# Patient Record
Sex: Female | Born: 1935 | Race: White | Hispanic: No | Smoking: Former smoker
Health system: Southern US, Community
[De-identification: ages and names within clinical notes are randomized; demographics above are authoritative.]

## PROBLEM LIST (undated history)

## (undated) DIAGNOSIS — I10 Essential (primary) hypertension: Secondary | ICD-10-CM

## (undated) DIAGNOSIS — S72142D Displaced intertrochanteric fracture of left femur, subsequent encounter for closed fracture with routine healing: Secondary | ICD-10-CM

## (undated) DIAGNOSIS — M810 Age-related osteoporosis without current pathological fracture: Secondary | ICD-10-CM

## (undated) DIAGNOSIS — I341 Nonrheumatic mitral (valve) prolapse: Secondary | ICD-10-CM

## (undated) DIAGNOSIS — M1990 Unspecified osteoarthritis, unspecified site: Secondary | ICD-10-CM

## (undated) DIAGNOSIS — G9340 Encephalopathy, unspecified: Secondary | ICD-10-CM

## (undated) DIAGNOSIS — R0789 Other chest pain: Secondary | ICD-10-CM

## (undated) DIAGNOSIS — C50919 Malignant neoplasm of unspecified site of unspecified female breast: Secondary | ICD-10-CM

## (undated) DIAGNOSIS — K2950 Unspecified chronic gastritis without bleeding: Secondary | ICD-10-CM

## (undated) DIAGNOSIS — B3781 Candidal esophagitis: Secondary | ICD-10-CM

## (undated) DIAGNOSIS — R569 Unspecified convulsions: Secondary | ICD-10-CM

## (undated) DIAGNOSIS — D62 Acute posthemorrhagic anemia: Secondary | ICD-10-CM

## (undated) DIAGNOSIS — R002 Palpitations: Secondary | ICD-10-CM

## (undated) DIAGNOSIS — I639 Cerebral infarction, unspecified: Secondary | ICD-10-CM

## (undated) DIAGNOSIS — S72009A Fracture of unspecified part of neck of unspecified femur, initial encounter for closed fracture: Secondary | ICD-10-CM

## (undated) DIAGNOSIS — J310 Chronic rhinitis: Secondary | ICD-10-CM

## (undated) DIAGNOSIS — I493 Ventricular premature depolarization: Secondary | ICD-10-CM

## (undated) DIAGNOSIS — R2681 Unsteadiness on feet: Secondary | ICD-10-CM

## (undated) DIAGNOSIS — R4182 Altered mental status, unspecified: Secondary | ICD-10-CM

## (undated) DIAGNOSIS — K2940 Chronic atrophic gastritis without bleeding: Secondary | ICD-10-CM

## (undated) DIAGNOSIS — D751 Secondary polycythemia: Secondary | ICD-10-CM

## (undated) DIAGNOSIS — K589 Irritable bowel syndrome without diarrhea: Secondary | ICD-10-CM

## (undated) DIAGNOSIS — K5790 Diverticulosis of intestine, part unspecified, without perforation or abscess without bleeding: Secondary | ICD-10-CM

## (undated) HISTORY — DX: Nonrheumatic mitral (valve) prolapse: I34.1

## (undated) HISTORY — DX: Secondary polycythemia: D75.1

## (undated) HISTORY — DX: Age-related osteoporosis without current pathological fracture: M81.0

## (undated) HISTORY — DX: Essential (primary) hypertension: I10

## (undated) HISTORY — PX: TONSILLECTOMY: SUR1361

## (undated) HISTORY — DX: Other chest pain: R07.89

## (undated) HISTORY — DX: Unsteadiness on feet: R26.81

## (undated) HISTORY — DX: Chronic rhinitis: J31.0

## (undated) HISTORY — DX: Palpitations: R00.2

## (undated) HISTORY — DX: Displaced intertrochanteric fracture of left femur, subsequent encounter for closed fracture with routine healing: S72.142D

## (undated) HISTORY — DX: Encephalopathy, unspecified: G93.40

## (undated) HISTORY — DX: Ventricular premature depolarization: I49.3

## (undated) HISTORY — DX: Irritable bowel syndrome, unspecified: K58.9

## (undated) HISTORY — DX: Fracture of unspecified part of neck of unspecified femur, initial encounter for closed fracture: S72.009A

## (undated) HISTORY — DX: Unspecified chronic gastritis without bleeding: K29.50

## (undated) HISTORY — DX: Chronic atrophic gastritis without bleeding: K29.40

## (undated) HISTORY — DX: Acute posthemorrhagic anemia: D62

## (undated) HISTORY — PX: APPENDECTOMY: SHX54

## (undated) HISTORY — DX: Altered mental status, unspecified: R41.82

## (undated) HISTORY — PX: ABDOMINAL HYSTERECTOMY: SHX81

## (undated) HISTORY — DX: Malignant neoplasm of unspecified site of unspecified female breast: C50.919

## (undated) HISTORY — DX: Unspecified osteoarthritis, unspecified site: M19.90

## (undated) HISTORY — DX: Unspecified convulsions: R56.9

## (undated) HISTORY — PX: HEMATOMA EVACUATION: SHX5118

## (undated) HISTORY — PX: CATARACT EXTRACTION: SUR2

## (undated) HISTORY — PX: ELBOW FRACTURE SURGERY: SHX616

## (undated) HISTORY — PX: BREAST LUMPECTOMY: SHX2

## (undated) HISTORY — DX: Cerebral infarction, unspecified: I63.9

## (undated) HISTORY — DX: Candidal esophagitis: B37.81

## (undated) HISTORY — DX: Diverticulosis of intestine, part unspecified, without perforation or abscess without bleeding: K57.90

---

## 1999-04-16 ENCOUNTER — Other Ambulatory Visit: Admission: RE | Admit: 1999-04-16 | Discharge: 1999-04-16 | Payer: Self-pay | Admitting: Obstetrics and Gynecology

## 2000-02-29 ENCOUNTER — Emergency Department (HOSPITAL_COMMUNITY): Admission: EM | Admit: 2000-02-29 | Discharge: 2000-02-29 | Payer: Self-pay | Admitting: Emergency Medicine

## 2000-09-12 ENCOUNTER — Encounter: Admission: RE | Admit: 2000-09-12 | Discharge: 2000-09-12 | Payer: Self-pay | Admitting: Internal Medicine

## 2000-09-12 ENCOUNTER — Encounter: Payer: Self-pay | Admitting: Internal Medicine

## 2000-12-23 HISTORY — PX: CARDIOVASCULAR STRESS TEST: SHX262

## 2001-05-29 ENCOUNTER — Other Ambulatory Visit: Admission: RE | Admit: 2001-05-29 | Discharge: 2001-05-29 | Payer: Self-pay | Admitting: Obstetrics and Gynecology

## 2002-11-22 ENCOUNTER — Encounter: Payer: Self-pay | Admitting: Internal Medicine

## 2002-11-22 ENCOUNTER — Encounter: Admission: RE | Admit: 2002-11-22 | Discharge: 2002-11-22 | Payer: Self-pay | Admitting: Internal Medicine

## 2004-06-19 ENCOUNTER — Ambulatory Visit (HOSPITAL_COMMUNITY): Admission: RE | Admit: 2004-06-19 | Discharge: 2004-06-20 | Payer: Self-pay | Admitting: Specialist

## 2004-11-20 ENCOUNTER — Encounter: Admission: RE | Admit: 2004-11-20 | Discharge: 2004-11-20 | Payer: Self-pay | Admitting: Obstetrics and Gynecology

## 2005-02-04 ENCOUNTER — Ambulatory Visit: Payer: Self-pay | Admitting: Internal Medicine

## 2005-03-16 ENCOUNTER — Encounter (INDEPENDENT_AMBULATORY_CARE_PROVIDER_SITE_OTHER): Payer: Self-pay | Admitting: *Deleted

## 2005-03-16 ENCOUNTER — Other Ambulatory Visit: Admission: RE | Admit: 2005-03-16 | Discharge: 2005-03-16 | Payer: Self-pay | Admitting: Internal Medicine

## 2005-03-16 ENCOUNTER — Ambulatory Visit: Payer: Self-pay | Admitting: Internal Medicine

## 2005-05-31 ENCOUNTER — Encounter: Admission: RE | Admit: 2005-05-31 | Discharge: 2005-05-31 | Payer: Self-pay | Admitting: Cardiovascular Disease

## 2005-12-05 ENCOUNTER — Encounter: Admission: RE | Admit: 2005-12-05 | Discharge: 2005-12-05 | Payer: Self-pay | Admitting: Radiology

## 2005-12-31 ENCOUNTER — Encounter: Admission: RE | Admit: 2005-12-31 | Discharge: 2005-12-31 | Payer: Self-pay | Admitting: General Surgery

## 2006-01-01 ENCOUNTER — Encounter: Admission: RE | Admit: 2006-01-01 | Discharge: 2006-01-01 | Payer: Self-pay | Admitting: General Surgery

## 2006-01-04 ENCOUNTER — Encounter (INDEPENDENT_AMBULATORY_CARE_PROVIDER_SITE_OTHER): Payer: Self-pay | Admitting: Specialist

## 2006-01-04 ENCOUNTER — Ambulatory Visit (HOSPITAL_BASED_OUTPATIENT_CLINIC_OR_DEPARTMENT_OTHER): Admission: RE | Admit: 2006-01-04 | Discharge: 2006-01-04 | Payer: Self-pay | Admitting: General Surgery

## 2006-01-05 ENCOUNTER — Ambulatory Visit: Payer: Self-pay | Admitting: Oncology

## 2006-01-10 ENCOUNTER — Ambulatory Visit: Admission: RE | Admit: 2006-01-10 | Discharge: 2006-04-27 | Payer: Self-pay | Admitting: Radiation Oncology

## 2006-01-20 LAB — COMPREHENSIVE METABOLIC PANEL
Albumin: 4.3 g/dL (ref 3.5–5.2)
BUN: 15 mg/dL (ref 6–23)
CO2: 31 mEq/L (ref 19–32)
Calcium: 10.1 mg/dL (ref 8.4–10.5)
Chloride: 99 mEq/L (ref 96–112)
Glucose, Bld: 160 mg/dL — ABNORMAL HIGH (ref 70–99)
Potassium: 4.3 mEq/L (ref 3.5–5.3)

## 2006-01-20 LAB — CBC WITH DIFFERENTIAL/PLATELET
Basophils Absolute: 0 10*3/uL (ref 0.0–0.1)
Eosinophils Absolute: 0.1 10*3/uL (ref 0.0–0.5)
HGB: 15.8 g/dL (ref 11.6–15.9)
MCV: 89.7 fL (ref 81.0–101.0)
MONO#: 0.7 10*3/uL (ref 0.1–0.9)
NEUT#: 6.8 10*3/uL — ABNORMAL HIGH (ref 1.5–6.5)
RDW: 16 % — ABNORMAL HIGH (ref 11.3–14.5)
lymph#: 1 10*3/uL (ref 0.9–3.3)

## 2006-01-21 ENCOUNTER — Ambulatory Visit (HOSPITAL_COMMUNITY): Admission: RE | Admit: 2006-01-21 | Discharge: 2006-01-21 | Payer: Self-pay | Admitting: Oncology

## 2006-01-31 ENCOUNTER — Ambulatory Visit (HOSPITAL_COMMUNITY): Admission: RE | Admit: 2006-01-31 | Discharge: 2006-01-31 | Payer: Self-pay | Admitting: General Surgery

## 2006-02-04 LAB — MORPHOLOGY: Tear Drop Cells: NONE SEEN

## 2006-02-04 LAB — CBC WITH DIFFERENTIAL/PLATELET
Basophils Absolute: 0 10*3/uL (ref 0.0–0.1)
EOS%: 2.1 % (ref 0.0–7.0)
Eosinophils Absolute: 0.2 10*3/uL (ref 0.0–0.5)
HGB: 15.9 g/dL (ref 11.6–15.9)
MCH: 29.9 pg (ref 26.0–34.0)
NEUT#: 7.3 10*3/uL — ABNORMAL HIGH (ref 1.5–6.5)
RBC: 5.34 10*6/uL — ABNORMAL HIGH (ref 3.70–5.32)
RDW: 15.8 % — ABNORMAL HIGH (ref 11.3–14.5)
lymph#: 1 10*3/uL (ref 0.9–3.3)

## 2006-02-04 LAB — CHCC SMEAR

## 2006-03-01 ENCOUNTER — Ambulatory Visit: Payer: Self-pay | Admitting: Oncology

## 2006-03-04 LAB — MORPHOLOGY: PLT EST: INCREASED

## 2006-03-04 LAB — CBC WITH DIFFERENTIAL/PLATELET
Basophils Absolute: 0 10*3/uL (ref 0.0–0.1)
EOS%: 4.7 % (ref 0.0–7.0)
Eosinophils Absolute: 0.4 10*3/uL (ref 0.0–0.5)
HGB: 16.8 g/dL — ABNORMAL HIGH (ref 11.6–15.9)
LYMPH%: 17 % (ref 14.0–48.0)
MCH: 30.4 pg (ref 26.0–34.0)
MCV: 89.5 fL (ref 81.0–101.0)
MONO%: 10.5 % (ref 0.0–13.0)
NEUT#: 5.4 10*3/uL (ref 1.5–6.5)
NEUT%: 67.7 % (ref 39.6–76.8)
Platelets: 615 10*3/uL — ABNORMAL HIGH (ref 145–400)

## 2006-03-04 LAB — CHCC SMEAR

## 2006-03-18 LAB — CBC WITH DIFFERENTIAL/PLATELET
BASO%: 0.5 % (ref 0.0–2.0)
Basophils Absolute: 0 10*3/uL (ref 0.0–0.1)
Eosinophils Absolute: 0.4 10*3/uL (ref 0.0–0.5)
HCT: 51.8 % — ABNORMAL HIGH (ref 34.8–46.6)
HGB: 17.8 g/dL — ABNORMAL HIGH (ref 11.6–15.9)
MCHC: 34.3 g/dL (ref 32.0–36.0)
MONO#: 0.7 10*3/uL (ref 0.1–0.9)
NEUT#: 5.9 10*3/uL (ref 1.5–6.5)
NEUT%: 74 % (ref 39.6–76.8)
WBC: 7.9 10*3/uL (ref 3.9–10.0)
lymph#: 0.9 10*3/uL (ref 0.9–3.3)

## 2006-03-29 LAB — CBC WITH DIFFERENTIAL/PLATELET
BASO%: 0.6 % (ref 0.0–2.0)
LYMPH%: 12.2 % — ABNORMAL LOW (ref 14.0–48.0)
MCHC: 33.9 g/dL (ref 32.0–36.0)
MCV: 87.5 fL (ref 81.0–101.0)
MONO%: 10.3 % (ref 0.0–13.0)
Platelets: 588 10*3/uL — ABNORMAL HIGH (ref 145–400)
RBC: 5.76 10*6/uL — ABNORMAL HIGH (ref 3.70–5.32)

## 2006-04-08 ENCOUNTER — Ambulatory Visit: Admission: RE | Admit: 2006-04-08 | Discharge: 2006-04-27 | Payer: Self-pay | Admitting: Radiation Oncology

## 2006-04-13 LAB — CBC WITH DIFFERENTIAL/PLATELET
Basophils Absolute: 0 10*3/uL (ref 0.0–0.1)
EOS%: 5.4 % (ref 0.0–7.0)
Eosinophils Absolute: 0.4 10*3/uL (ref 0.0–0.5)
MCHC: 33.8 g/dL (ref 32.0–36.0)
MONO%: 9.7 % (ref 0.0–13.0)
NEUT%: 74.8 % (ref 39.6–76.8)
Platelets: 521 10*3/uL — ABNORMAL HIGH (ref 145–400)
RBC: 6.01 10*6/uL — ABNORMAL HIGH (ref 3.70–5.32)
RDW: 13.1 % (ref 11.3–14.5)
WBC: 7.4 10*3/uL (ref 3.9–10.0)

## 2006-06-10 ENCOUNTER — Ambulatory Visit: Payer: Self-pay | Admitting: Oncology

## 2006-06-14 LAB — CBC & DIFF AND RETIC
BASO%: 0.5 % (ref 0.0–2.0)
Eosinophils Absolute: 0.4 10*3/uL (ref 0.0–0.5)
IRF: 0.29 (ref 0.130–0.330)
MCHC: 33.9 g/dL (ref 32.0–36.0)
MONO#: 0.8 10*3/uL (ref 0.1–0.9)
NEUT#: 5.3 10*3/uL (ref 1.5–6.5)
RBC: 6.3 10*6/uL — ABNORMAL HIGH (ref 3.70–5.32)
RETIC #: 66.8 10*3/uL (ref 19.7–115.1)
Retic %: 1.1 % (ref 0.4–2.3)
WBC: 7.2 10*3/uL (ref 3.9–10.0)
lymph#: 0.7 10*3/uL — ABNORMAL LOW (ref 0.9–3.3)

## 2006-06-14 LAB — MORPHOLOGY: PLT EST: INCREASED

## 2006-06-14 LAB — CHCC SMEAR

## 2006-08-23 ENCOUNTER — Inpatient Hospital Stay (HOSPITAL_COMMUNITY): Admission: EM | Admit: 2006-08-23 | Discharge: 2006-08-28 | Payer: Self-pay | Admitting: Emergency Medicine

## 2007-01-23 ENCOUNTER — Ambulatory Visit: Payer: Self-pay | Admitting: Vascular Surgery

## 2007-04-19 ENCOUNTER — Ambulatory Visit: Payer: Self-pay | Admitting: Oncology

## 2007-04-21 LAB — CBC & DIFF AND RETIC
BASO%: 0.3 % (ref 0.0–2.0)
HCT: 54.2 % — ABNORMAL HIGH (ref 34.8–46.6)
IRF: 0.31 (ref 0.130–0.330)
MCHC: 33.8 g/dL (ref 32.0–36.0)
MONO#: 0.7 10*3/uL (ref 0.1–0.9)
NEUT#: 5.2 10*3/uL (ref 1.5–6.5)
NEUT%: 71.1 % (ref 39.6–76.8)
Retic %: 1.1 % (ref 0.4–2.3)
WBC: 7.3 10*3/uL (ref 3.9–10.0)
lymph#: 1 10*3/uL (ref 0.9–3.3)

## 2007-04-21 LAB — CHCC SMEAR

## 2007-04-21 LAB — MORPHOLOGY: RBC Comments: NORMAL

## 2007-04-26 LAB — COMPREHENSIVE METABOLIC PANEL
ALT: 16 U/L (ref 0–35)
Alkaline Phosphatase: 85 U/L (ref 39–117)
CO2: 33 mEq/L — ABNORMAL HIGH (ref 19–32)
Sodium: 139 mEq/L (ref 135–145)
Total Bilirubin: 0.5 mg/dL (ref 0.3–1.2)
Total Protein: 7 g/dL (ref 6.0–8.3)

## 2007-05-11 ENCOUNTER — Encounter: Admission: RE | Admit: 2007-05-11 | Discharge: 2007-05-11 | Payer: Self-pay | Admitting: Internal Medicine

## 2007-05-15 LAB — CBC WITH DIFFERENTIAL/PLATELET
BASO%: 0.5 % (ref 0.0–2.0)
EOS%: 4.6 % (ref 0.0–7.0)
HCT: 55 % — ABNORMAL HIGH (ref 34.8–46.6)
MCH: 27.6 pg (ref 26.0–34.0)
MCHC: 33.4 g/dL (ref 32.0–36.0)
NEUT%: 70.1 % (ref 39.6–76.8)
RBC: 6.67 10*6/uL — ABNORMAL HIGH (ref 3.70–5.32)
RDW: 14.5 % (ref 11.3–14.5)
lymph#: 0.9 10*3/uL (ref 0.9–3.3)

## 2007-05-24 LAB — BCR/ABL

## 2007-05-29 ENCOUNTER — Ambulatory Visit: Payer: Self-pay | Admitting: Oncology

## 2007-05-31 LAB — CBC WITH DIFFERENTIAL/PLATELET
EOS%: 4.9 % (ref 0.0–7.0)
Eosinophils Absolute: 0.3 10*3/uL (ref 0.0–0.5)
MCH: 27.4 pg (ref 26.0–34.0)
MCV: 82.3 fL (ref 81.0–101.0)
MONO%: 10.3 % (ref 0.0–13.0)
NEUT#: 5 10*3/uL (ref 1.5–6.5)
RBC: 6.14 10*6/uL — ABNORMAL HIGH (ref 3.70–5.32)
RDW: 14.2 % (ref 11.3–14.5)

## 2007-05-31 LAB — COMPREHENSIVE METABOLIC PANEL
AST: 20 U/L (ref 0–37)
Albumin: 4.1 g/dL (ref 3.5–5.2)
Alkaline Phosphatase: 81 U/L (ref 39–117)
Potassium: 4.8 mEq/L (ref 3.5–5.3)
Sodium: 137 mEq/L (ref 135–145)
Total Protein: 6.5 g/dL (ref 6.0–8.3)

## 2007-06-14 LAB — CBC WITH DIFFERENTIAL/PLATELET
Basophils Absolute: 0 10*3/uL (ref 0.0–0.1)
HCT: 49 % — ABNORMAL HIGH (ref 34.8–46.6)
HGB: 16.3 g/dL — ABNORMAL HIGH (ref 11.6–15.9)
MCH: 27.1 pg (ref 26.0–34.0)
MONO#: 0.6 10*3/uL (ref 0.1–0.9)
NEUT%: 75.2 % (ref 39.6–76.8)
Platelets: 730 10*3/uL — ABNORMAL HIGH (ref 145–400)
WBC: 7.7 10*3/uL (ref 3.9–10.0)
lymph#: 0.9 10*3/uL (ref 0.9–3.3)

## 2007-06-14 LAB — COMPREHENSIVE METABOLIC PANEL
BUN: 13 mg/dL (ref 6–23)
CO2: 29 mEq/L (ref 19–32)
Calcium: 10.1 mg/dL (ref 8.4–10.5)
Chloride: 101 mEq/L (ref 96–112)
Creatinine, Ser: 0.94 mg/dL (ref 0.40–1.20)

## 2007-06-19 DIAGNOSIS — K2940 Chronic atrophic gastritis without bleeding: Secondary | ICD-10-CM | POA: Insufficient documentation

## 2007-06-19 DIAGNOSIS — M129 Arthropathy, unspecified: Secondary | ICD-10-CM | POA: Insufficient documentation

## 2007-06-19 DIAGNOSIS — K5730 Diverticulosis of large intestine without perforation or abscess without bleeding: Secondary | ICD-10-CM | POA: Insufficient documentation

## 2007-06-20 ENCOUNTER — Ambulatory Visit: Payer: Self-pay | Admitting: Internal Medicine

## 2007-06-20 DIAGNOSIS — C50919 Malignant neoplasm of unspecified site of unspecified female breast: Secondary | ICD-10-CM | POA: Insufficient documentation

## 2007-06-20 DIAGNOSIS — D45 Polycythemia vera: Secondary | ICD-10-CM | POA: Insufficient documentation

## 2007-06-20 DIAGNOSIS — R1013 Epigastric pain: Secondary | ICD-10-CM

## 2007-06-27 ENCOUNTER — Ambulatory Visit: Payer: Self-pay | Admitting: Internal Medicine

## 2007-06-27 ENCOUNTER — Encounter: Payer: Self-pay | Admitting: Internal Medicine

## 2007-06-29 ENCOUNTER — Encounter: Payer: Self-pay | Admitting: Internal Medicine

## 2007-07-10 ENCOUNTER — Ambulatory Visit: Payer: Self-pay | Admitting: Oncology

## 2007-07-12 LAB — CBC WITH DIFFERENTIAL/PLATELET
BASO%: 0.1 % (ref 0.0–2.0)
EOS%: 6.4 % (ref 0.0–7.0)
LYMPH%: 11.3 % — ABNORMAL LOW (ref 14.0–48.0)
MCHC: 32.5 g/dL (ref 32.0–36.0)
MCV: 77.6 fL — ABNORMAL LOW (ref 81.0–101.0)
MONO%: 8.3 % (ref 0.0–13.0)
Platelets: 868 10*3/uL — ABNORMAL HIGH (ref 145–400)
RBC: 4.61 10*6/uL (ref 3.70–5.32)
RDW: 14.1 % (ref 11.3–14.5)

## 2007-07-12 LAB — COMPREHENSIVE METABOLIC PANEL WITH GFR
ALT: 15 U/L (ref 0–35)
AST: 18 U/L (ref 0–37)
Albumin: 4 g/dL (ref 3.5–5.2)
Alkaline Phosphatase: 74 U/L (ref 39–117)
BUN: 12 mg/dL (ref 6–23)
CO2: 28 meq/L (ref 19–32)
Calcium: 9.3 mg/dL (ref 8.4–10.5)
Chloride: 102 meq/L (ref 96–112)
Creatinine, Ser: 0.96 mg/dL (ref 0.40–1.20)
Glucose, Bld: 105 mg/dL — ABNORMAL HIGH (ref 70–99)
Potassium: 4.8 meq/L (ref 3.5–5.3)
Sodium: 139 meq/L (ref 135–145)
Total Bilirubin: 0.3 mg/dL (ref 0.3–1.2)
Total Protein: 6.4 g/dL (ref 6.0–8.3)

## 2007-07-12 LAB — LACTATE DEHYDROGENASE: LDH: 163 U/L (ref 94–250)

## 2007-07-26 ENCOUNTER — Encounter: Payer: Self-pay | Admitting: Internal Medicine

## 2007-07-26 LAB — CBC WITH DIFFERENTIAL/PLATELET
BASO%: 0.1 % (ref 0.0–2.0)
HCT: 38.3 % (ref 34.8–46.6)
LYMPH%: 13.2 % — ABNORMAL LOW (ref 14.0–48.0)
MCH: 24.6 pg — ABNORMAL LOW (ref 26.0–34.0)
MCHC: 32.7 g/dL (ref 32.0–36.0)
MCV: 75.1 fL — ABNORMAL LOW (ref 81.0–101.0)
MONO#: 0.7 10*3/uL (ref 0.1–0.9)
MONO%: 9.8 % (ref 0.0–13.0)
NEUT%: 70 % (ref 39.6–76.8)
Platelets: 701 10*3/uL — ABNORMAL HIGH (ref 145–400)
WBC: 7.6 10*3/uL (ref 3.9–10.0)

## 2007-07-26 LAB — COMPREHENSIVE METABOLIC PANEL
Albumin: 4 g/dL (ref 3.5–5.2)
Alkaline Phosphatase: 80 U/L (ref 39–117)
Calcium: 9.5 mg/dL (ref 8.4–10.5)
Chloride: 103 mEq/L (ref 96–112)
Glucose, Bld: 95 mg/dL (ref 70–99)
Potassium: 4.6 mEq/L (ref 3.5–5.3)
Sodium: 138 mEq/L (ref 135–145)
Total Protein: 6.5 g/dL (ref 6.0–8.3)

## 2007-07-26 LAB — FERRITIN: Ferritin: 4 ng/mL — ABNORMAL LOW (ref 10–291)

## 2007-07-26 LAB — IRON AND TIBC: Iron: 16 ug/dL — ABNORMAL LOW (ref 42–145)

## 2007-08-09 LAB — CBC WITH DIFFERENTIAL/PLATELET
BASO%: 0.3 % (ref 0.0–2.0)
EOS%: 5.1 % (ref 0.0–7.0)
MCH: 24.4 pg — ABNORMAL LOW (ref 26.0–34.0)
MCHC: 32.5 g/dL (ref 32.0–36.0)
MCV: 75 fL — ABNORMAL LOW (ref 81.0–101.0)
MONO%: 7.4 % (ref 0.0–13.0)
NEUT#: 5.9 10*3/uL (ref 1.5–6.5)
RBC: 5.61 10*6/uL — ABNORMAL HIGH (ref 3.70–5.32)
RDW: 16.8 % — ABNORMAL HIGH (ref 11.3–14.5)

## 2007-08-09 LAB — COMPREHENSIVE METABOLIC PANEL
AST: 18 U/L (ref 0–37)
Albumin: 4.4 g/dL (ref 3.5–5.2)
Alkaline Phosphatase: 76 U/L (ref 39–117)
Potassium: 4.5 mEq/L (ref 3.5–5.3)
Sodium: 141 mEq/L (ref 135–145)
Total Bilirubin: 0.4 mg/dL (ref 0.3–1.2)
Total Protein: 7 g/dL (ref 6.0–8.3)

## 2007-08-17 ENCOUNTER — Ambulatory Visit: Payer: Self-pay | Admitting: Oncology

## 2007-08-22 ENCOUNTER — Encounter: Payer: Self-pay | Admitting: Internal Medicine

## 2007-08-22 LAB — CBC WITH DIFFERENTIAL/PLATELET
Basophils Absolute: 0.1 10*3/uL (ref 0.0–0.1)
EOS%: 3.9 % (ref 0.0–7.0)
Eosinophils Absolute: 0.3 10*3/uL (ref 0.0–0.5)
HGB: 14 g/dL (ref 11.6–15.9)
LYMPH%: 15.2 % (ref 14.0–48.0)
MCH: 25 pg — ABNORMAL LOW (ref 26.0–34.0)
MCV: 77.8 fL — ABNORMAL LOW (ref 81.0–101.0)
MONO%: 10.2 % (ref 0.0–13.0)
NEUT#: 5.4 10*3/uL (ref 1.5–6.5)
Platelets: 553 10*3/uL — ABNORMAL HIGH (ref 145–400)
RBC: 5.63 10*6/uL — ABNORMAL HIGH (ref 3.70–5.32)

## 2007-09-05 LAB — CBC WITH DIFFERENTIAL/PLATELET
Basophils Absolute: 0 10*3/uL (ref 0.0–0.1)
Eosinophils Absolute: 0.2 10*3/uL (ref 0.0–0.5)
HCT: 47.5 % — ABNORMAL HIGH (ref 34.8–46.6)
HGB: 15.4 g/dL (ref 11.6–15.9)
LYMPH%: 12.8 % — ABNORMAL LOW (ref 14.0–48.0)
MCV: 79.1 fL — ABNORMAL LOW (ref 81.0–101.0)
MONO#: 0.6 10*3/uL (ref 0.1–0.9)
MONO%: 10 % (ref 0.0–13.0)
NEUT#: 4.5 10*3/uL (ref 1.5–6.5)
NEUT%: 73 % (ref 39.6–76.8)
Platelets: 495 10*3/uL — ABNORMAL HIGH (ref 145–400)
WBC: 6.2 10*3/uL (ref 3.9–10.0)

## 2007-09-05 LAB — MORPHOLOGY: PLT EST: INCREASED

## 2007-09-19 LAB — MORPHOLOGY

## 2007-09-19 LAB — CBC WITH DIFFERENTIAL/PLATELET
BASO%: 0.1 % (ref 0.0–2.0)
EOS%: 3.4 % (ref 0.0–7.0)
Eosinophils Absolute: 0.3 10*3/uL (ref 0.0–0.5)
MCV: 78.9 fL — ABNORMAL LOW (ref 81.0–101.0)
MONO%: 8.8 % (ref 0.0–13.0)
NEUT#: 5.7 10*3/uL (ref 1.5–6.5)
RBC: 6.03 10*6/uL — ABNORMAL HIGH (ref 3.70–5.32)
RDW: 24 % — ABNORMAL HIGH (ref 11.3–14.5)

## 2007-10-03 ENCOUNTER — Ambulatory Visit: Payer: Self-pay | Admitting: Oncology

## 2007-10-03 LAB — CBC WITH DIFFERENTIAL/PLATELET
Basophils Absolute: 0 10*3/uL (ref 0.0–0.1)
Eosinophils Absolute: 0.2 10*3/uL (ref 0.0–0.5)
HCT: 48 % — ABNORMAL HIGH (ref 34.8–46.6)
HGB: 16 g/dL — ABNORMAL HIGH (ref 11.6–15.9)
LYMPH%: 12.3 % — ABNORMAL LOW (ref 14.0–48.0)
MCHC: 33.5 g/dL (ref 32.0–36.0)
MONO#: 0.6 10*3/uL (ref 0.1–0.9)
NEUT#: 5.6 10*3/uL (ref 1.5–6.5)
NEUT%: 76.7 % (ref 39.6–76.8)
Platelets: 451 10*3/uL — ABNORMAL HIGH (ref 145–400)
WBC: 7.3 10*3/uL (ref 3.9–10.0)
lymph#: 0.9 10*3/uL (ref 0.9–3.3)

## 2007-10-03 LAB — MORPHOLOGY: PLT EST: INCREASED

## 2007-10-17 LAB — CBC WITH DIFFERENTIAL/PLATELET
BASO%: 0.1 % (ref 0.0–2.0)
Basophils Absolute: 0 10*3/uL (ref 0.0–0.1)
HCT: 49.2 % — ABNORMAL HIGH (ref 34.8–46.6)
HGB: 16.2 g/dL — ABNORMAL HIGH (ref 11.6–15.9)
MONO#: 0.5 10*3/uL (ref 0.1–0.9)
NEUT%: 78.8 % — ABNORMAL HIGH (ref 39.6–76.8)
RDW: 23.6 % — ABNORMAL HIGH (ref 11.3–14.5)
WBC: 7.4 10*3/uL (ref 3.9–10.0)
lymph#: 0.9 10*3/uL (ref 0.9–3.3)

## 2007-10-17 LAB — MORPHOLOGY: PLT EST: ADEQUATE

## 2007-10-23 ENCOUNTER — Encounter: Payer: Self-pay | Admitting: Internal Medicine

## 2007-10-23 LAB — LACTATE DEHYDROGENASE: LDH: 150 U/L (ref 94–250)

## 2007-10-23 LAB — COMPREHENSIVE METABOLIC PANEL
ALT: 13 U/L (ref 0–35)
AST: 15 U/L (ref 0–37)
Albumin: 4.2 g/dL (ref 3.5–5.2)
CO2: 28 mEq/L (ref 19–32)
Calcium: 10.2 mg/dL (ref 8.4–10.5)
Chloride: 100 mEq/L (ref 96–112)
Creatinine, Ser: 1 mg/dL (ref 0.40–1.20)
Potassium: 4.8 mEq/L (ref 3.5–5.3)

## 2007-10-23 LAB — CBC & DIFF AND RETIC
BASO%: 0.2 % (ref 0.0–2.0)
EOS%: 2.2 % (ref 0.0–7.0)
HCT: 52.3 % — ABNORMAL HIGH (ref 34.8–46.6)
LYMPH%: 12.5 % — ABNORMAL LOW (ref 14.0–48.0)
MCH: 27.1 pg (ref 26.0–34.0)
MCHC: 33.1 g/dL (ref 32.0–36.0)
MONO#: 0.6 10*3/uL (ref 0.1–0.9)
NEUT%: 77 % — ABNORMAL HIGH (ref 39.6–76.8)
Platelets: 399 10*3/uL (ref 145–400)
RBC: 6.39 10*6/uL — ABNORMAL HIGH (ref 3.70–5.32)
Retic %: 1.5 % (ref 0.4–2.3)
WBC: 7.7 10*3/uL (ref 3.9–10.0)

## 2007-10-23 LAB — CHCC SMEAR

## 2007-11-06 LAB — CBC WITH DIFFERENTIAL/PLATELET
BASO%: 0.7 % (ref 0.0–2.0)
EOS%: 3 % (ref 0.0–7.0)
LYMPH%: 14.4 % (ref 14.0–48.0)
MCH: 27 pg (ref 26.0–34.0)
MCHC: 32.8 g/dL (ref 32.0–36.0)
MONO#: 0.6 10*3/uL (ref 0.1–0.9)
MONO%: 8.8 % (ref 0.0–13.0)
Platelets: 503 10*3/uL — ABNORMAL HIGH (ref 145–400)
RBC: 5.72 10*6/uL — ABNORMAL HIGH (ref 3.70–5.32)
WBC: 7.3 10*3/uL (ref 3.9–10.0)

## 2007-11-20 ENCOUNTER — Ambulatory Visit: Payer: Self-pay | Admitting: Oncology

## 2007-11-20 LAB — CBC WITH DIFFERENTIAL/PLATELET
BASO%: 0 % (ref 0.0–2.0)
EOS%: 3.4 % (ref 0.0–7.0)
HCT: 45.1 % (ref 34.8–46.6)
MCH: 28.3 pg (ref 26.0–34.0)
MCHC: 33.3 g/dL (ref 32.0–36.0)
MONO#: 0.6 10*3/uL (ref 0.1–0.9)
RBC: 5.3 10*6/uL (ref 3.70–5.32)
RDW: 16 % — ABNORMAL HIGH (ref 11.3–14.5)
WBC: 7.5 10*3/uL (ref 3.9–10.0)
lymph#: 1.1 10*3/uL (ref 0.9–3.3)

## 2007-12-04 LAB — CBC WITH DIFFERENTIAL/PLATELET
BASO%: 0 % (ref 0.0–2.0)
Basophils Absolute: 0 10e3/uL (ref 0.0–0.1)
EOS%: 3.7 % (ref 0.0–7.0)
Eosinophils Absolute: 0.2 10e3/uL (ref 0.0–0.5)
HCT: 40.5 % (ref 34.8–46.6)
HGB: 13.5 g/dL (ref 11.6–15.9)
LYMPH%: 13.1 % — ABNORMAL LOW (ref 14.0–48.0)
MCH: 29 pg (ref 26.0–34.0)
MCHC: 33.5 g/dL (ref 32.0–36.0)
MCV: 86.6 fL (ref 81.0–101.0)
MONO#: 0.6 10e3/uL (ref 0.1–0.9)
MONO%: 9 % (ref 0.0–13.0)
NEUT#: 4.8 10e3/uL (ref 1.5–6.5)
NEUT%: 74.2 % (ref 39.6–76.8)
Platelets: 527 10e3/uL — ABNORMAL HIGH (ref 145–400)
RBC: 4.67 10e6/uL (ref 3.70–5.32)
RDW: 14.3 % (ref 11.3–14.5)
WBC: 6.4 10e3/uL (ref 3.9–10.0)
lymph#: 0.8 10e3/uL — ABNORMAL LOW (ref 0.9–3.3)

## 2007-12-18 ENCOUNTER — Encounter: Payer: Self-pay | Admitting: Internal Medicine

## 2007-12-18 LAB — COMPREHENSIVE METABOLIC PANEL
Albumin: 4.2 g/dL (ref 3.5–5.2)
CO2: 27 mEq/L (ref 19–32)
Chloride: 102 mEq/L (ref 96–112)
Glucose, Bld: 103 mg/dL — ABNORMAL HIGH (ref 70–99)
Potassium: 4.5 mEq/L (ref 3.5–5.3)
Sodium: 139 mEq/L (ref 135–145)
Total Protein: 6.6 g/dL (ref 6.0–8.3)

## 2007-12-18 LAB — CBC WITH DIFFERENTIAL/PLATELET
Basophils Absolute: 0 10*3/uL (ref 0.0–0.1)
EOS%: 3.4 % (ref 0.0–7.0)
HCT: 43.9 % (ref 34.8–46.6)
HGB: 14.6 g/dL (ref 11.6–15.9)
MCH: 29.1 pg (ref 26.0–34.0)
MCV: 87.3 fL (ref 81.0–101.0)
MONO%: 8.5 % (ref 0.0–13.0)
NEUT%: 74.9 % (ref 39.6–76.8)
Platelets: 472 10*3/uL — ABNORMAL HIGH (ref 145–400)

## 2007-12-18 LAB — IRON AND TIBC
Iron: 148 ug/dL — ABNORMAL HIGH (ref 42–145)
UIBC: 165 ug/dL

## 2008-01-01 LAB — CBC WITH DIFFERENTIAL/PLATELET
BASO%: 0.3 % (ref 0.0–2.0)
Eosinophils Absolute: 0.2 10*3/uL (ref 0.0–0.5)
LYMPH%: 14.4 % (ref 14.0–48.0)
MCHC: 33.2 g/dL (ref 32.0–36.0)
MCV: 88 fL (ref 81.0–101.0)
MONO%: 8.1 % (ref 0.0–13.0)
NEUT%: 73.5 % (ref 39.6–76.8)
Platelets: 482 10*3/uL — ABNORMAL HIGH (ref 145–400)
RBC: 5.05 10*6/uL (ref 3.70–5.32)

## 2008-01-11 ENCOUNTER — Ambulatory Visit: Payer: Self-pay | Admitting: Oncology

## 2008-01-15 LAB — CBC WITH DIFFERENTIAL/PLATELET
Eosinophils Absolute: 0.3 10*3/uL (ref 0.0–0.5)
MCV: 84.9 fL (ref 81.0–101.0)
MONO#: 0.7 10*3/uL (ref 0.1–0.9)
MONO%: 9.2 % (ref 0.0–13.0)
NEUT#: 5.7 10*3/uL (ref 1.5–6.5)
RBC: 5.73 10*6/uL — ABNORMAL HIGH (ref 3.70–5.32)
RDW: 13 % (ref 11.3–14.5)
WBC: 8 10*3/uL (ref 3.9–10.0)
lymph#: 1.2 10*3/uL (ref 0.9–3.3)

## 2008-01-29 LAB — CBC WITH DIFFERENTIAL/PLATELET
Eosinophils Absolute: 0.3 10*3/uL (ref 0.0–0.5)
HGB: 15.7 g/dL (ref 11.6–15.9)
LYMPH%: 16.3 % (ref 14.0–48.0)
MONO#: 0.7 10*3/uL (ref 0.1–0.9)
NEUT#: 5.7 10*3/uL (ref 1.5–6.5)
Platelets: 501 10*3/uL — ABNORMAL HIGH (ref 145–400)
RBC: 5.51 10*6/uL — ABNORMAL HIGH (ref 3.70–5.32)
WBC: 8.1 10*3/uL (ref 3.9–10.0)

## 2008-01-29 LAB — TECHNOLOGIST REVIEW

## 2008-02-14 ENCOUNTER — Encounter: Payer: Self-pay | Admitting: Internal Medicine

## 2008-02-14 LAB — CBC WITH DIFFERENTIAL/PLATELET
BASO%: 0.1 % (ref 0.0–2.0)
LYMPH%: 12.2 % — ABNORMAL LOW (ref 14.0–48.0)
MCHC: 32.9 g/dL (ref 32.0–36.0)
MONO#: 0.5 10*3/uL (ref 0.1–0.9)
MONO%: 8.9 % (ref 0.0–13.0)
Platelets: 560 10*3/uL — ABNORMAL HIGH (ref 145–400)
RBC: 5.03 10*6/uL (ref 3.70–5.32)
WBC: 6 10*3/uL (ref 3.9–10.0)

## 2008-02-14 LAB — COMPREHENSIVE METABOLIC PANEL
ALT: 12 U/L (ref 0–35)
Alkaline Phosphatase: 65 U/L (ref 39–117)
CO2: 28 mEq/L (ref 19–32)
Sodium: 137 mEq/L (ref 135–145)
Total Bilirubin: 0.4 mg/dL (ref 0.3–1.2)
Total Protein: 6.7 g/dL (ref 6.0–8.3)

## 2008-02-14 LAB — IRON AND TIBC: %SAT: 10 % — ABNORMAL LOW (ref 20–55)

## 2008-02-26 ENCOUNTER — Ambulatory Visit: Payer: Self-pay | Admitting: Oncology

## 2008-02-28 LAB — CBC WITH DIFFERENTIAL/PLATELET
Basophils Absolute: 0.1 10*3/uL (ref 0.0–0.1)
HCT: 44.6 % (ref 34.8–46.6)
HGB: 14.9 g/dL (ref 11.6–15.9)
MONO#: 0.5 10*3/uL (ref 0.1–0.9)
NEUT#: 5.2 10*3/uL (ref 1.5–6.5)
NEUT%: 72.2 % (ref 39.6–76.8)
WBC: 7.2 10*3/uL (ref 3.9–10.0)
lymph#: 1.1 10*3/uL (ref 0.9–3.3)

## 2008-03-27 LAB — CBC WITH DIFFERENTIAL/PLATELET
Basophils Absolute: 0 10*3/uL (ref 0.0–0.1)
Eosinophils Absolute: 0.3 10*3/uL (ref 0.0–0.5)
HCT: 45.9 % (ref 34.8–46.6)
HGB: 15 g/dL (ref 11.6–15.9)
LYMPH%: 13.7 % — ABNORMAL LOW (ref 14.0–49.7)
MCV: 86.5 fL (ref 79.5–101.0)
MONO%: 8.4 % (ref 0.0–14.0)
NEUT#: 4.9 10*3/uL (ref 1.5–6.5)
NEUT%: 73.9 % (ref 38.4–76.8)
Platelets: 502 10*3/uL — ABNORMAL HIGH (ref 145–400)
RBC: 5.3 10*6/uL (ref 3.70–5.45)

## 2008-04-10 ENCOUNTER — Encounter: Payer: Self-pay | Admitting: Internal Medicine

## 2008-04-10 LAB — CBC WITH DIFFERENTIAL/PLATELET
Basophils Absolute: 0 10*3/uL (ref 0.0–0.1)
EOS%: 4 % (ref 0.0–7.0)
HGB: 13.9 g/dL (ref 11.6–15.9)
MCH: 28 pg (ref 25.1–34.0)
MCV: 86.1 fL (ref 79.5–101.0)
MONO%: 9.3 % (ref 0.0–14.0)
RBC: 4.97 10*6/uL (ref 3.70–5.45)
RDW: 13.9 % (ref 11.2–14.5)

## 2008-04-10 LAB — COMPREHENSIVE METABOLIC PANEL
AST: 16 U/L (ref 0–37)
Albumin: 4.1 g/dL (ref 3.5–5.2)
Alkaline Phosphatase: 65 U/L (ref 39–117)
BUN: 12 mg/dL (ref 6–23)
Potassium: 4.4 mEq/L (ref 3.5–5.3)

## 2008-05-06 ENCOUNTER — Ambulatory Visit: Payer: Self-pay | Admitting: Oncology

## 2008-05-08 LAB — CBC WITH DIFFERENTIAL/PLATELET
Basophils Absolute: 0 10*3/uL (ref 0.0–0.1)
EOS%: 5.1 % (ref 0.0–7.0)
HCT: 45.8 % (ref 34.8–46.6)
HGB: 15.1 g/dL (ref 11.6–15.9)
LYMPH%: 13.3 % — ABNORMAL LOW (ref 14.0–49.7)
MCH: 27.9 pg (ref 25.1–34.0)
MCV: 84.6 fL (ref 79.5–101.0)
MONO%: 9.7 % (ref 0.0–14.0)
NEUT%: 71.6 % (ref 38.4–76.8)
Platelets: 486 10*3/uL — ABNORMAL HIGH (ref 145–400)

## 2008-06-05 LAB — CBC & DIFF AND RETIC
BASO%: 0.5 % (ref 0.0–2.0)
Basophils Absolute: 0 10*3/uL (ref 0.0–0.1)
EOS%: 5.2 % (ref 0.0–7.0)
HCT: 46.7 % — ABNORMAL HIGH (ref 34.8–46.6)
HGB: 15.4 g/dL (ref 11.6–15.9)
IRF: 0.45 — ABNORMAL HIGH (ref 0.130–0.330)
LYMPH%: 12.4 % — ABNORMAL LOW (ref 14.0–49.7)
MCH: 27.7 pg (ref 25.1–34.0)
MCHC: 33 g/dL (ref 31.5–36.0)
MCV: 84.1 fL (ref 79.5–101.0)
MONO%: 7.8 % (ref 0.0–14.0)
NEUT%: 74.1 % (ref 38.4–76.8)
lymph#: 1 10*3/uL (ref 0.9–3.3)

## 2008-06-05 LAB — MORPHOLOGY

## 2008-06-05 LAB — IRON AND TIBC
%SAT: 13 % — ABNORMAL LOW (ref 20–55)
TIBC: 319 ug/dL (ref 250–470)

## 2008-06-05 LAB — FERRITIN: Ferritin: 24 ng/mL (ref 10–291)

## 2008-06-05 LAB — CHCC SMEAR

## 2008-06-28 ENCOUNTER — Ambulatory Visit: Payer: Self-pay | Admitting: Oncology

## 2008-07-03 ENCOUNTER — Encounter: Payer: Self-pay | Admitting: Internal Medicine

## 2008-07-03 LAB — LACTATE DEHYDROGENASE: LDH: 148 U/L (ref 94–250)

## 2008-07-03 LAB — COMPREHENSIVE METABOLIC PANEL
Albumin: 4.2 g/dL (ref 3.5–5.2)
BUN: 13 mg/dL (ref 6–23)
CO2: 27 mEq/L (ref 19–32)
Calcium: 9.8 mg/dL (ref 8.4–10.5)
Chloride: 99 mEq/L (ref 96–112)
Glucose, Bld: 90 mg/dL (ref 70–99)
Potassium: 4.6 mEq/L (ref 3.5–5.3)

## 2008-07-03 LAB — CBC WITH DIFFERENTIAL/PLATELET
Basophils Absolute: 0 10*3/uL (ref 0.0–0.1)
Eosinophils Absolute: 0.3 10*3/uL (ref 0.0–0.5)
HCT: 44.9 % (ref 34.8–46.6)
HGB: 14.7 g/dL (ref 11.6–15.9)
NEUT#: 5.1 10*3/uL (ref 1.5–6.5)
RDW: 15.1 % — ABNORMAL HIGH (ref 11.2–14.5)
lymph#: 0.9 10*3/uL (ref 0.9–3.3)

## 2008-07-03 LAB — CANCER ANTIGEN 27.29: CA 27.29: 32 U/mL (ref 0–39)

## 2008-07-11 ENCOUNTER — Encounter: Payer: Self-pay | Admitting: Internal Medicine

## 2008-07-31 ENCOUNTER — Ambulatory Visit: Payer: Self-pay | Admitting: Oncology

## 2008-08-02 LAB — CBC WITH DIFFERENTIAL/PLATELET
BASO%: 0.3 % (ref 0.0–2.0)
Basophils Absolute: 0 10*3/uL (ref 0.0–0.1)
EOS%: 4.2 % (ref 0.0–7.0)
HGB: 15 g/dL (ref 11.6–15.9)
MCH: 27.8 pg (ref 25.1–34.0)
MONO#: 0.6 10*3/uL (ref 0.1–0.9)
RDW: 15.3 % — ABNORMAL HIGH (ref 11.2–14.5)
WBC: 6.9 10*3/uL (ref 3.9–10.3)
lymph#: 0.9 10*3/uL (ref 0.9–3.3)

## 2008-09-02 ENCOUNTER — Ambulatory Visit: Payer: Self-pay | Admitting: Oncology

## 2008-09-02 LAB — CBC WITH DIFFERENTIAL/PLATELET
BASO%: 0.3 % (ref 0.0–2.0)
EOS%: 5.4 % (ref 0.0–7.0)
HCT: 45.3 % (ref 34.8–46.6)
MCH: 27.5 pg (ref 25.1–34.0)
MCHC: 32.5 g/dL (ref 31.5–36.0)
MONO#: 0.7 10*3/uL (ref 0.1–0.9)
NEUT%: 71.5 % (ref 38.4–76.8)
RBC: 5.34 10*6/uL (ref 3.70–5.45)
RDW: 15.2 % — ABNORMAL HIGH (ref 11.2–14.5)
WBC: 7 10*3/uL (ref 3.9–10.3)
lymph#: 0.8 10*3/uL — ABNORMAL LOW (ref 0.9–3.3)

## 2008-10-03 ENCOUNTER — Encounter: Payer: Self-pay | Admitting: Internal Medicine

## 2008-10-03 ENCOUNTER — Ambulatory Visit: Payer: Self-pay | Admitting: Oncology

## 2008-10-03 LAB — LACTATE DEHYDROGENASE: LDH: 159 U/L (ref 94–250)

## 2008-10-03 LAB — COMPREHENSIVE METABOLIC PANEL
ALT: 13 U/L (ref 0–35)
AST: 13 U/L (ref 0–37)
CO2: 24 mEq/L (ref 19–32)
Chloride: 101 mEq/L (ref 96–112)
Sodium: 136 mEq/L (ref 135–145)
Total Bilirubin: 0.4 mg/dL (ref 0.3–1.2)
Total Protein: 6.1 g/dL (ref 6.0–8.3)

## 2008-10-03 LAB — CBC WITH DIFFERENTIAL/PLATELET
BASO%: 0.4 % (ref 0.0–2.0)
Basophils Absolute: 0 10e3/uL (ref 0.0–0.1)
EOS%: 3.5 % (ref 0.0–7.0)
Eosinophils Absolute: 0.2 10e3/uL (ref 0.0–0.5)
HCT: 47.6 % — ABNORMAL HIGH (ref 34.8–46.6)
HGB: 15.7 g/dL (ref 11.6–15.9)
LYMPH%: 11.4 % — ABNORMAL LOW (ref 14.0–49.7)
MCH: 27.8 pg (ref 25.1–34.0)
MCHC: 33 g/dL (ref 31.5–36.0)
MCV: 84.4 fL (ref 79.5–101.0)
MONO#: 0.5 10e3/uL (ref 0.1–0.9)
MONO%: 6.7 % (ref 0.0–14.0)
NEUT#: 5.6 10e3/uL (ref 1.5–6.5)
NEUT%: 78 % — ABNORMAL HIGH (ref 38.4–76.8)
Platelets: 480 10e3/uL — ABNORMAL HIGH (ref 145–400)
RBC: 5.64 10e6/uL — ABNORMAL HIGH (ref 3.70–5.45)
RDW: 14.3 % (ref 11.2–14.5)
WBC: 7.1 10e3/uL (ref 3.9–10.3)
lymph#: 0.8 10e3/uL — ABNORMAL LOW (ref 0.9–3.3)

## 2008-10-03 LAB — CANCER ANTIGEN 27.29: CA 27.29: 30 U/mL (ref 0–39)

## 2008-10-03 LAB — FERRITIN: Ferritin: 13 ng/mL (ref 10–291)

## 2008-10-17 LAB — CBC WITH DIFFERENTIAL/PLATELET
BASO%: 0.1 % (ref 0.0–2.0)
EOS%: 3.8 % (ref 0.0–7.0)
HGB: 15.8 g/dL (ref 11.6–15.9)
MCH: 27.1 pg (ref 25.1–34.0)
MCHC: 33.1 g/dL (ref 31.5–36.0)
RDW: 14.7 % — ABNORMAL HIGH (ref 11.2–14.5)
lymph#: 0.7 10*3/uL — ABNORMAL LOW (ref 0.9–3.3)

## 2008-10-31 LAB — CBC WITH DIFFERENTIAL/PLATELET
Basophils Absolute: 0 10*3/uL (ref 0.0–0.1)
Eosinophils Absolute: 0.4 10*3/uL (ref 0.0–0.5)
HGB: 16.4 g/dL — ABNORMAL HIGH (ref 11.6–15.9)
NEUT#: 6 10*3/uL (ref 1.5–6.5)
RDW: 14.8 % — ABNORMAL HIGH (ref 11.2–14.5)
WBC: 8.3 10*3/uL (ref 3.9–10.3)
lymph#: 1 10*3/uL (ref 0.9–3.3)

## 2008-11-12 ENCOUNTER — Ambulatory Visit: Payer: Self-pay | Admitting: Oncology

## 2008-11-14 LAB — CBC WITH DIFFERENTIAL/PLATELET
BASO%: 0.2 % (ref 0.0–2.0)
LYMPH%: 9.8 % — ABNORMAL LOW (ref 14.0–49.7)
MCHC: 33.1 g/dL (ref 31.5–36.0)
MCV: 84.3 fL (ref 79.5–101.0)
MONO%: 7.8 % (ref 0.0–14.0)
Platelets: 451 10*3/uL — ABNORMAL HIGH (ref 145–400)
RBC: 5.05 10*6/uL (ref 3.70–5.45)

## 2008-11-28 LAB — CBC WITH DIFFERENTIAL/PLATELET
BASO%: 0.2 % (ref 0.0–2.0)
LYMPH%: 12 % — ABNORMAL LOW (ref 14.0–49.7)
MCHC: 33.1 g/dL (ref 31.5–36.0)
MONO#: 0.8 10*3/uL (ref 0.1–0.9)
RBC: 5.37 10*6/uL (ref 3.70–5.45)
WBC: 8.4 10*3/uL (ref 3.9–10.3)
lymph#: 1 10*3/uL (ref 0.9–3.3)

## 2008-12-12 ENCOUNTER — Ambulatory Visit: Payer: Self-pay | Admitting: Oncology

## 2008-12-12 LAB — CBC WITH DIFFERENTIAL/PLATELET
Basophils Absolute: 0 10*3/uL (ref 0.0–0.1)
Eosinophils Absolute: 0.3 10*3/uL (ref 0.0–0.5)
HCT: 47.5 % — ABNORMAL HIGH (ref 34.8–46.6)
HGB: 15.5 g/dL (ref 11.6–15.9)
MCV: 86.7 fL (ref 79.5–101.0)
MONO%: 9.4 % (ref 0.0–14.0)
NEUT#: 5.8 10*3/uL (ref 1.5–6.5)
NEUT%: 75.3 % (ref 38.4–76.8)
RDW: 16 % — ABNORMAL HIGH (ref 11.2–14.5)

## 2008-12-25 LAB — CBC WITH DIFFERENTIAL/PLATELET
Basophils Absolute: 0 10*3/uL (ref 0.0–0.1)
Eosinophils Absolute: 0.3 10*3/uL (ref 0.0–0.5)
HGB: 16.2 g/dL — ABNORMAL HIGH (ref 11.6–15.9)
LYMPH%: 10.5 % — ABNORMAL LOW (ref 14.0–49.7)
MCV: 87.6 fL (ref 79.5–101.0)
MONO%: 8.8 % (ref 0.0–14.0)
NEUT#: 5.8 10*3/uL (ref 1.5–6.5)
Platelets: 427 10*3/uL — ABNORMAL HIGH (ref 145–400)
RBC: 5.69 10*6/uL — ABNORMAL HIGH (ref 3.70–5.45)

## 2009-01-02 ENCOUNTER — Encounter: Payer: Self-pay | Admitting: Internal Medicine

## 2009-01-02 LAB — COMPREHENSIVE METABOLIC PANEL
AST: 22 U/L (ref 0–37)
BUN: 16 mg/dL (ref 6–23)
Calcium: 9.7 mg/dL (ref 8.4–10.5)
Chloride: 102 mEq/L (ref 96–112)
Creatinine, Ser: 1.02 mg/dL (ref 0.40–1.20)

## 2009-01-02 LAB — CBC WITH DIFFERENTIAL/PLATELET
Basophils Absolute: 0 10*3/uL (ref 0.0–0.1)
EOS%: 4.2 % (ref 0.0–7.0)
HCT: 46.2 % (ref 34.8–46.6)
HGB: 15.4 g/dL (ref 11.6–15.9)
MCH: 28.8 pg (ref 25.1–34.0)
MCV: 86.4 fL (ref 79.5–101.0)
MONO%: 11.1 % (ref 0.0–14.0)
NEUT%: 73.2 % (ref 38.4–76.8)
lymph#: 1 10*3/uL (ref 0.9–3.3)

## 2009-01-23 ENCOUNTER — Ambulatory Visit: Payer: Self-pay | Admitting: Oncology

## 2009-01-23 LAB — CBC WITH DIFFERENTIAL/PLATELET
Basophils Absolute: 0 10*3/uL (ref 0.0–0.1)
Eosinophils Absolute: 0.4 10*3/uL (ref 0.0–0.5)
HGB: 16.2 g/dL — ABNORMAL HIGH (ref 11.6–15.9)
MCV: 88.3 fL (ref 79.5–101.0)
MONO#: 0.7 10*3/uL (ref 0.1–0.9)
MONO%: 7.5 % (ref 0.0–14.0)
NEUT#: 7.3 10*3/uL — ABNORMAL HIGH (ref 1.5–6.5)
RDW: 15.2 % — ABNORMAL HIGH (ref 11.2–14.5)
lymph#: 1 10*3/uL (ref 0.9–3.3)

## 2009-02-25 ENCOUNTER — Ambulatory Visit: Payer: Self-pay | Admitting: Oncology

## 2009-02-27 LAB — CBC WITH DIFFERENTIAL/PLATELET
BASO%: 0.1 % (ref 0.0–2.0)
EOS%: 6.8 % (ref 0.0–7.0)
MCH: 29.6 pg (ref 25.1–34.0)
MCHC: 33.5 g/dL (ref 31.5–36.0)
MCV: 88.3 fL (ref 79.5–101.0)
MONO%: 10 % (ref 0.0–14.0)
NEUT%: 71.3 % (ref 38.4–76.8)
RDW: 14.1 % (ref 11.2–14.5)
lymph#: 1 10*3/uL (ref 0.9–3.3)

## 2009-03-25 ENCOUNTER — Ambulatory Visit: Payer: Self-pay | Admitting: Oncology

## 2009-03-27 LAB — CBC WITH DIFFERENTIAL/PLATELET
BASO%: 0 % (ref 0.0–2.0)
Eosinophils Absolute: 0.6 10*3/uL — ABNORMAL HIGH (ref 0.0–0.5)
HCT: 43.5 % (ref 34.8–46.6)
MCV: 88.4 fL (ref 79.5–101.0)
MONO#: 0.7 10*3/uL (ref 0.1–0.9)
NEUT#: 6.1 10*3/uL (ref 1.5–6.5)
Platelets: 615 10*3/uL — ABNORMAL HIGH (ref 145–400)
WBC: 8.2 10*3/uL (ref 3.9–10.3)

## 2009-05-07 ENCOUNTER — Ambulatory Visit: Payer: Self-pay | Admitting: Oncology

## 2009-05-09 ENCOUNTER — Encounter: Payer: Self-pay | Admitting: Internal Medicine

## 2009-05-09 LAB — CBC & DIFF AND RETIC
BASO%: 0.1 % (ref 0.0–2.0)
EOS%: 3.8 % (ref 0.0–7.0)
MCH: 28.9 pg (ref 25.1–34.0)
MCHC: 33.9 g/dL (ref 31.5–36.0)
MCV: 85.3 fL (ref 79.5–101.0)
MONO%: 9.3 % (ref 0.0–14.0)
NEUT%: 74.2 % (ref 38.4–76.8)
RDW: 14.2 % (ref 11.2–14.5)
Retic %: 1.38 % (ref 0.50–1.50)
Retic Ct Abs: 78.8 10*3/uL — ABNORMAL HIGH (ref 18.30–72.70)
lymph#: 1.1 10*3/uL (ref 0.9–3.3)

## 2009-05-09 LAB — COMPREHENSIVE METABOLIC PANEL
ALT: 15 U/L (ref 0–35)
AST: 17 U/L (ref 0–37)
Albumin: 4.6 g/dL (ref 3.5–5.2)
BUN: 19 mg/dL (ref 6–23)
CO2: 27 mEq/L (ref 19–32)
Calcium: 9.4 mg/dL (ref 8.4–10.5)
Chloride: 92 mEq/L — ABNORMAL LOW (ref 96–112)
Creatinine, Ser: 1.09 mg/dL (ref 0.40–1.20)
Potassium: 4.8 mEq/L (ref 3.5–5.3)

## 2009-05-09 LAB — LACTATE DEHYDROGENASE: LDH: 162 U/L (ref 94–250)

## 2009-05-09 LAB — FERRITIN: Ferritin: 28 ng/mL (ref 10–291)

## 2009-05-09 LAB — CHCC SMEAR

## 2009-05-09 LAB — IRON AND TIBC
%SAT: 29 % (ref 20–55)
Iron: 89 ug/dL (ref 42–145)

## 2009-06-06 ENCOUNTER — Ambulatory Visit: Payer: Self-pay | Admitting: Oncology

## 2009-06-06 LAB — CBC WITH DIFFERENTIAL/PLATELET
BASO%: 0.2 % (ref 0.0–2.0)
Eosinophils Absolute: 0.4 10*3/uL (ref 0.0–0.5)
LYMPH%: 13.9 % — ABNORMAL LOW (ref 14.0–49.7)
MCHC: 34.8 g/dL (ref 31.5–36.0)
MONO#: 0.9 10*3/uL (ref 0.1–0.9)
NEUT#: 5.9 10*3/uL (ref 1.5–6.5)
Platelets: 310 10*3/uL (ref 145–400)
RBC: 5.43 10*6/uL (ref 3.70–5.45)
RDW: 14.4 % (ref 11.2–14.5)
WBC: 8.3 10*3/uL (ref 3.9–10.3)
lymph#: 1.2 10*3/uL (ref 0.9–3.3)
nRBC: 0 % (ref 0–0)

## 2009-07-07 ENCOUNTER — Ambulatory Visit: Payer: Self-pay | Admitting: Oncology

## 2009-07-09 LAB — CBC WITH DIFFERENTIAL/PLATELET
BASO%: 0.2 % (ref 0.0–2.0)
Basophils Absolute: 0 10*3/uL (ref 0.0–0.1)
EOS%: 4.4 % (ref 0.0–7.0)
HCT: 46.2 % (ref 34.8–46.6)
LYMPH%: 14.2 % (ref 14.0–49.7)
MCH: 29.8 pg (ref 25.1–34.0)
MCHC: 33.5 g/dL (ref 31.5–36.0)
MONO#: 0.8 10*3/uL (ref 0.1–0.9)
NEUT%: 70.2 % (ref 38.4–76.8)
Platelets: 391 10*3/uL (ref 145–400)
WBC: 7.5 10*3/uL (ref 3.9–10.3)
lymph#: 1.1 10*3/uL (ref 0.9–3.3)

## 2009-07-15 ENCOUNTER — Encounter: Admission: RE | Admit: 2009-07-15 | Discharge: 2009-07-15 | Payer: Self-pay | Admitting: Internal Medicine

## 2009-08-06 ENCOUNTER — Ambulatory Visit: Payer: Self-pay | Admitting: Oncology

## 2009-08-08 LAB — CBC WITH DIFFERENTIAL/PLATELET
BASO%: 0.3 % (ref 0.0–2.0)
EOS%: 4.3 % (ref 0.0–7.0)
HCT: 44.5 % (ref 34.8–46.6)
LYMPH%: 11.5 % — ABNORMAL LOW (ref 14.0–49.7)
MCH: 29.4 pg (ref 25.1–34.0)
MCHC: 33.1 g/dL (ref 31.5–36.0)
NEUT%: 73.9 % (ref 38.4–76.8)
Platelets: 322 10*3/uL (ref 145–400)
lymph#: 0.9 10*3/uL (ref 0.9–3.3)

## 2009-09-08 ENCOUNTER — Ambulatory Visit: Payer: Self-pay | Admitting: Oncology

## 2009-09-08 ENCOUNTER — Encounter: Payer: Self-pay | Admitting: Internal Medicine

## 2009-09-08 LAB — CBC & DIFF AND RETIC
BASO%: 0.2 % (ref 0.0–2.0)
Eosinophils Absolute: 0.4 10*3/uL (ref 0.0–0.5)
HGB: 15.9 g/dL (ref 11.6–15.9)
Immature Retic Fract: 7.8 % (ref 0.00–10.70)
LYMPH%: 12.2 % — ABNORMAL LOW (ref 14.0–49.7)
MCV: 87.1 fL (ref 79.5–101.0)
MONO#: 0.8 10*3/uL (ref 0.1–0.9)
NEUT#: 5.9 10*3/uL (ref 1.5–6.5)
NEUT%: 73.1 % (ref 38.4–76.8)
RDW: 14.1 % (ref 11.2–14.5)
Retic %: 1.51 % — ABNORMAL HIGH (ref 0.50–1.50)
Retic Ct Abs: 80.63 10*3/uL — ABNORMAL HIGH (ref 18.30–72.70)
WBC: 8 10*3/uL (ref 3.9–10.3)

## 2009-09-08 LAB — BASIC METABOLIC PANEL
CO2: 25 mEq/L (ref 19–32)
Calcium: 9.8 mg/dL (ref 8.4–10.5)
Chloride: 94 mEq/L — ABNORMAL LOW (ref 96–112)
Creatinine, Ser: 1.19 mg/dL (ref 0.40–1.20)
Glucose, Bld: 82 mg/dL (ref 70–99)
Sodium: 130 mEq/L — ABNORMAL LOW (ref 135–145)

## 2009-09-08 LAB — CHCC SMEAR

## 2009-09-08 LAB — IRON AND TIBC
%SAT: 29 % (ref 20–55)
Iron: 89 ug/dL (ref 42–145)
UIBC: 218 ug/dL

## 2009-09-08 LAB — MORPHOLOGY: PLT EST: ADEQUATE

## 2009-10-09 ENCOUNTER — Ambulatory Visit: Payer: Self-pay | Admitting: Oncology

## 2009-10-09 LAB — CBC WITH DIFFERENTIAL/PLATELET
BASO%: 0.2 % (ref 0.0–2.0)
Basophils Absolute: 0 10e3/uL (ref 0.0–0.1)
EOS%: 4 % (ref 0.0–7.0)
Eosinophils Absolute: 0.3 10e3/uL (ref 0.0–0.5)
HCT: 46.9 % — ABNORMAL HIGH (ref 34.8–46.6)
HGB: 15.6 g/dL (ref 11.6–15.9)
LYMPH%: 10.9 % — ABNORMAL LOW (ref 14.0–49.7)
MCH: 29.6 pg (ref 25.1–34.0)
MCHC: 33.3 g/dL (ref 31.5–36.0)
MCV: 89.1 fL (ref 79.5–101.0)
MONO#: 0.7 10e3/uL (ref 0.1–0.9)
MONO%: 9.1 % (ref 0.0–14.0)
NEUT#: 5.9 10e3/uL (ref 1.5–6.5)
NEUT%: 75.8 % (ref 38.4–76.8)
Platelets: 409 10e3/uL — ABNORMAL HIGH (ref 145–400)
RBC: 5.26 10e6/uL (ref 3.70–5.45)
RDW: 14.2 % (ref 11.2–14.5)
WBC: 7.8 10e3/uL (ref 3.9–10.3)
lymph#: 0.8 10e3/uL — ABNORMAL LOW (ref 0.9–3.3)

## 2009-11-07 LAB — CBC WITH DIFFERENTIAL/PLATELET
BASO%: 0.1 % (ref 0.0–2.0)
EOS%: 4.4 % (ref 0.0–7.0)
MCH: 30.1 pg (ref 25.1–34.0)
MCHC: 33.7 g/dL (ref 31.5–36.0)
MCV: 89.5 fL (ref 79.5–101.0)
MONO%: 9.8 % (ref 0.0–14.0)
RBC: 5.14 10*6/uL (ref 3.70–5.45)
RDW: 14.1 % (ref 11.2–14.5)

## 2009-12-04 ENCOUNTER — Ambulatory Visit: Payer: Self-pay | Admitting: Oncology

## 2009-12-08 LAB — CBC WITH DIFFERENTIAL/PLATELET
BASO%: 0.1 % (ref 0.0–2.0)
Basophils Absolute: 0 10*3/uL (ref 0.0–0.1)
EOS%: 3.5 % (ref 0.0–7.0)
HCT: 46 % (ref 34.8–46.6)
HGB: 15.9 g/dL (ref 11.6–15.9)
LYMPH%: 10.2 % — ABNORMAL LOW (ref 14.0–49.7)
MCH: 30.6 pg (ref 25.1–34.0)
MCHC: 34.4 g/dL (ref 31.5–36.0)
MCV: 88.8 fL (ref 79.5–101.0)
MONO%: 8.9 % (ref 0.0–14.0)
NEUT%: 77.3 % — ABNORMAL HIGH (ref 38.4–76.8)
Platelets: 439 10*3/uL — ABNORMAL HIGH (ref 145–400)

## 2010-01-05 ENCOUNTER — Ambulatory Visit (HOSPITAL_BASED_OUTPATIENT_CLINIC_OR_DEPARTMENT_OTHER): Payer: Medicare Other | Admitting: Oncology

## 2010-01-07 LAB — CBC WITH DIFFERENTIAL/PLATELET
Basophils Absolute: 0 10*3/uL (ref 0.0–0.1)
Eosinophils Absolute: 0.2 10*3/uL (ref 0.0–0.5)
HCT: 48.6 % — ABNORMAL HIGH (ref 34.8–46.6)
HGB: 16.6 g/dL — ABNORMAL HIGH (ref 11.6–15.9)
LYMPH%: 10.6 % — ABNORMAL LOW (ref 14.0–49.7)
MCV: 89.8 fL (ref 79.5–101.0)
MONO#: 1 10*3/uL — ABNORMAL HIGH (ref 0.1–0.9)
MONO%: 10.1 % (ref 0.0–14.0)
NEUT#: 7.6 10*3/uL — ABNORMAL HIGH (ref 1.5–6.5)
Platelets: 441 10*3/uL — ABNORMAL HIGH (ref 145–400)
RBC: 5.41 10*6/uL (ref 3.70–5.45)
WBC: 9.9 10*3/uL (ref 3.9–10.3)

## 2010-02-06 LAB — CBC WITH DIFFERENTIAL/PLATELET
BASO%: 0 % (ref 0.0–2.0)
Basophils Absolute: 0 10*3/uL (ref 0.0–0.1)
EOS%: 3.7 % (ref 0.0–7.0)
Eosinophils Absolute: 0.3 10*3/uL (ref 0.0–0.5)
HCT: 42 % (ref 34.8–46.6)
HGB: 14.1 g/dL (ref 11.6–15.9)
LYMPH%: 9.6 % — ABNORMAL LOW (ref 14.0–49.7)
MCH: 30.4 pg (ref 25.1–34.0)
MCHC: 33.5 g/dL (ref 31.5–36.0)
MCV: 90.6 fL (ref 79.5–101.0)
MONO#: 0.8 10*3/uL (ref 0.1–0.9)
MONO%: 9.6 % (ref 0.0–14.0)
NEUT#: 6.8 10*3/uL — ABNORMAL HIGH (ref 1.5–6.5)
NEUT%: 77.1 % — ABNORMAL HIGH (ref 38.4–76.8)
Platelets: 597 10*3/uL — ABNORMAL HIGH (ref 145–400)
RBC: 4.64 10*6/uL (ref 3.70–5.45)
RDW: 14.6 % — ABNORMAL HIGH (ref 11.2–14.5)
WBC: 8.8 10*3/uL (ref 3.9–10.3)
lymph#: 0.8 10*3/uL — ABNORMAL LOW (ref 0.9–3.3)

## 2010-02-25 ENCOUNTER — Ambulatory Visit: Payer: Self-pay | Admitting: Cardiovascular Disease

## 2010-03-04 ENCOUNTER — Ambulatory Visit: Payer: Medicare Other | Admitting: Oncology

## 2010-03-04 ENCOUNTER — Other Ambulatory Visit (HOSPITAL_COMMUNITY): Payer: Self-pay

## 2010-03-04 DIAGNOSIS — D45 Polycythemia vera: Secondary | ICD-10-CM

## 2010-03-04 LAB — IRON AND TIBC
Iron: 49 ug/dL (ref 42–145)
TIBC: 290 ug/dL (ref 250–470)
UIBC: 241 ug/dL

## 2010-03-04 LAB — CBC & DIFF AND RETIC
BASO%: 0.1 % (ref 0.0–2.0)
Basophils Absolute: 0 10*3/uL (ref 0.0–0.1)
EOS%: 2.3 % (ref 0.0–7.0)
Eosinophils Absolute: 0.2 10*3/uL (ref 0.0–0.5)
HCT: 44 % (ref 34.8–46.6)
HGB: 15 g/dL (ref 11.6–15.9)
MCH: 30.1 pg (ref 25.1–34.0)
MCV: 88.2 fL (ref 79.5–101.0)
NEUT#: 7 10*3/uL — ABNORMAL HIGH (ref 1.5–6.5)
NEUT%: 80.2 % — ABNORMAL HIGH (ref 38.4–76.8)
WBC: 8.7 10*3/uL (ref 3.9–10.3)
lymph#: 0.6 10*3/uL — ABNORMAL LOW (ref 0.9–3.3)

## 2010-03-04 LAB — COMPREHENSIVE METABOLIC PANEL
Albumin: 4.3 g/dL (ref 3.5–5.2)
BUN: 17 mg/dL (ref 6–23)
Calcium: 9.9 mg/dL (ref 8.4–10.5)
Chloride: 93 mEq/L — ABNORMAL LOW (ref 96–112)
Creatinine, Ser: 0.99 mg/dL (ref 0.40–1.20)
Glucose, Bld: 111 mg/dL — ABNORMAL HIGH (ref 70–99)
Potassium: 4.5 mEq/L (ref 3.5–5.3)

## 2010-03-04 LAB — FERRITIN: Ferritin: 36 ng/mL (ref 10–291)

## 2010-03-04 LAB — CHCC SMEAR

## 2010-03-05 ENCOUNTER — Ambulatory Visit (HOSPITAL_COMMUNITY): Payer: Medicare Other | Attending: Cardiology

## 2010-03-05 ENCOUNTER — Other Ambulatory Visit (HOSPITAL_COMMUNITY): Payer: Self-pay

## 2010-03-05 ENCOUNTER — Encounter: Payer: Self-pay | Admitting: Cardiovascular Disease

## 2010-03-05 DIAGNOSIS — I079 Rheumatic tricuspid valve disease, unspecified: Secondary | ICD-10-CM | POA: Insufficient documentation

## 2010-03-05 DIAGNOSIS — I10 Essential (primary) hypertension: Secondary | ICD-10-CM | POA: Insufficient documentation

## 2010-03-05 DIAGNOSIS — I059 Rheumatic mitral valve disease, unspecified: Secondary | ICD-10-CM

## 2010-03-05 DIAGNOSIS — I080 Rheumatic disorders of both mitral and aortic valves: Secondary | ICD-10-CM | POA: Insufficient documentation

## 2010-03-05 DIAGNOSIS — I319 Disease of pericardium, unspecified: Secondary | ICD-10-CM | POA: Insufficient documentation

## 2010-03-05 HISTORY — PX: TRANSTHORACIC ECHOCARDIOGRAM: SHX275

## 2010-03-05 NOTE — Letter (Signed)
Summary: [facility]  [facility]   Imported By: Sherian Rein 02/06/2009 15:17:33  _____________________________________________________________________  External Attachment:    Type:   Image     Comment:   External Document

## 2010-03-05 NOTE — Letter (Signed)
Summary: [facility]  [facility]   Imported By: Lennie Odor 10/21/2009 11:44:42  _____________________________________________________________________  External Attachment:    Type:   Image     Comment:   External Document

## 2010-03-05 NOTE — Letter (Signed)
[facility]   Imported By: Maryln Gottron 06/10/2009 14:03:56  _____________________________________________________________________  External Attachment:    Type:   Image     Comment:   External Document

## 2010-03-12 ENCOUNTER — Encounter (HOSPITAL_BASED_OUTPATIENT_CLINIC_OR_DEPARTMENT_OTHER): Payer: Medicare Other | Admitting: Oncology

## 2010-03-12 ENCOUNTER — Encounter: Payer: Self-pay | Admitting: Internal Medicine

## 2010-03-12 DIAGNOSIS — D473 Essential (hemorrhagic) thrombocythemia: Secondary | ICD-10-CM

## 2010-03-12 DIAGNOSIS — D0590 Unspecified type of carcinoma in situ of unspecified breast: Secondary | ICD-10-CM

## 2010-04-10 ENCOUNTER — Other Ambulatory Visit: Payer: Self-pay | Admitting: Oncology

## 2010-04-10 ENCOUNTER — Encounter (HOSPITAL_BASED_OUTPATIENT_CLINIC_OR_DEPARTMENT_OTHER): Payer: Medicare Other | Admitting: Oncology

## 2010-04-10 DIAGNOSIS — D0590 Unspecified type of carcinoma in situ of unspecified breast: Secondary | ICD-10-CM

## 2010-04-10 DIAGNOSIS — D473 Essential (hemorrhagic) thrombocythemia: Secondary | ICD-10-CM

## 2010-04-10 LAB — CBC WITH DIFFERENTIAL/PLATELET
Basophils Absolute: 0 10*3/uL (ref 0.0–0.1)
Eosinophils Absolute: 0.4 10*3/uL (ref 0.0–0.5)
HCT: 46.5 % (ref 34.8–46.6)
HGB: 15.6 g/dL (ref 11.6–15.9)
MCH: 30.3 pg (ref 25.1–34.0)
NEUT#: 6.2 10*3/uL (ref 1.5–6.5)
NEUT%: 75.5 % (ref 38.4–76.8)
RDW: 13.4 % (ref 11.2–14.5)
lymph#: 0.8 10*3/uL — ABNORMAL LOW (ref 0.9–3.3)

## 2010-04-14 NOTE — Letter (Signed)
Summary: Pierce Crane [facility]  Pierce Crane [facility]   Imported By: Lester Peach 04/09/2010 09:02:58  _____________________________________________________________________  External Attachment:    Type:   Image     Comment:   External Document

## 2010-04-23 ENCOUNTER — Other Ambulatory Visit: Payer: Self-pay | Admitting: *Deleted

## 2010-04-23 DIAGNOSIS — I10 Essential (primary) hypertension: Secondary | ICD-10-CM

## 2010-04-23 MED ORDER — HYDROCHLOROTHIAZIDE 25 MG PO TABS: 25.0000 mg | ORAL_TABLET | Freq: Every day | ORAL | Status: DC

## 2010-04-23 NOTE — Telephone Encounter (Signed)
Request for refill on HCTZ to [facility] is completed

## 2010-04-24 ENCOUNTER — Other Ambulatory Visit: Payer: Self-pay | Admitting: Oncology

## 2010-04-24 ENCOUNTER — Encounter (HOSPITAL_BASED_OUTPATIENT_CLINIC_OR_DEPARTMENT_OTHER): Payer: Medicare Other | Admitting: Oncology

## 2010-04-24 DIAGNOSIS — D473 Essential (hemorrhagic) thrombocythemia: Secondary | ICD-10-CM

## 2010-04-24 DIAGNOSIS — D0590 Unspecified type of carcinoma in situ of unspecified breast: Secondary | ICD-10-CM

## 2010-04-24 LAB — CBC WITH DIFFERENTIAL/PLATELET
Basophils Absolute: 0 10*3/uL (ref 0.0–0.1)
EOS%: 4.7 % (ref 0.0–7.0)
Eosinophils Absolute: 0.4 10*3/uL (ref 0.0–0.5)
HCT: 46.5 % (ref 34.8–46.6)
HGB: 15.5 g/dL (ref 11.6–15.9)
LYMPH%: 11.1 % — ABNORMAL LOW (ref 14.0–49.7)
MCH: 30 pg (ref 25.1–34.0)
MCV: 90.1 fL (ref 79.5–101.0)
MONO%: 9.3 % (ref 0.0–14.0)
NEUT#: 5.8 10*3/uL (ref 1.5–6.5)
NEUT%: 74.8 % (ref 38.4–76.8)
Platelets: 442 10*3/uL — ABNORMAL HIGH (ref 145–400)

## 2010-05-11 ENCOUNTER — Other Ambulatory Visit: Payer: Self-pay | Admitting: Oncology

## 2010-05-11 ENCOUNTER — Encounter (HOSPITAL_BASED_OUTPATIENT_CLINIC_OR_DEPARTMENT_OTHER): Payer: Medicare Other | Admitting: Oncology

## 2010-05-11 DIAGNOSIS — D473 Essential (hemorrhagic) thrombocythemia: Secondary | ICD-10-CM

## 2010-05-11 DIAGNOSIS — D0590 Unspecified type of carcinoma in situ of unspecified breast: Secondary | ICD-10-CM

## 2010-05-11 LAB — CBC WITH DIFFERENTIAL/PLATELET
EOS%: 4.8 % (ref 0.0–7.0)
Eosinophils Absolute: 0.4 10*3/uL (ref 0.0–0.5)
LYMPH%: 13.3 % — ABNORMAL LOW (ref 14.0–49.7)
MCH: 29.6 pg (ref 25.1–34.0)
MCV: 89.6 fL (ref 79.5–101.0)
MONO%: 9.8 % (ref 0.0–14.0)
NEUT#: 5.4 10*3/uL (ref 1.5–6.5)
Platelets: 550 10*3/uL — ABNORMAL HIGH (ref 145–400)
RBC: 5.18 10*6/uL (ref 3.70–5.45)

## 2010-06-10 ENCOUNTER — Other Ambulatory Visit: Payer: Self-pay | Admitting: Oncology

## 2010-06-10 ENCOUNTER — Encounter (HOSPITAL_BASED_OUTPATIENT_CLINIC_OR_DEPARTMENT_OTHER): Payer: Medicare Other | Admitting: Oncology

## 2010-06-10 DIAGNOSIS — D473 Essential (hemorrhagic) thrombocythemia: Secondary | ICD-10-CM

## 2010-06-10 DIAGNOSIS — D0590 Unspecified type of carcinoma in situ of unspecified breast: Secondary | ICD-10-CM

## 2010-06-10 LAB — CBC WITH DIFFERENTIAL/PLATELET
BASO%: 0.2 % (ref 0.0–2.0)
LYMPH%: 13.9 % — ABNORMAL LOW (ref 14.0–49.7)
MCHC: 33 g/dL (ref 31.5–36.0)
MCV: 88.6 fL (ref 79.5–101.0)
MONO#: 0.7 10*3/uL (ref 0.1–0.9)
MONO%: 9 % (ref 0.0–14.0)
Platelets: 434 10*3/uL — ABNORMAL HIGH (ref 145–400)
RBC: 5.36 10*6/uL (ref 3.70–5.45)
RDW: 13.5 % (ref 11.2–14.5)
WBC: 7.6 10*3/uL (ref 3.9–10.3)

## 2010-06-12 ENCOUNTER — Encounter (HOSPITAL_BASED_OUTPATIENT_CLINIC_OR_DEPARTMENT_OTHER): Payer: Medicare Other | Admitting: Oncology

## 2010-06-12 DIAGNOSIS — D473 Essential (hemorrhagic) thrombocythemia: Secondary | ICD-10-CM

## 2010-06-16 NOTE — Procedures (Signed)
DUPLEX ULTRASOUND OF ABDOMINAL AORTA   INDICATION:  Abdominal bruit.   HISTORY:  Diabetes:  No.  Cardiac:  No.  Hypertension:  No.  Smoking:  No.  Connective Tissue Disorder:  Family History:  No.  Previous Surgery:  No.   DUPLEX EXAM:         AP (cm)                   TRANSVERSE (cm)  Proximal             1.91 Cm                   1.98 cm  Mid                  1.44 cm                   1.46. cm  Distal               1.48 cm                   1.31 cm  Right Iliac          0.75 cm                   0.78 cm  Left Iliac           0.81 cm                   0.77 cm   PREVIOUS:  Date:  AP:  TRANSVERSE:   IMPRESSION:  The abdominal aorta was imaged, dopplered, and shows no  evidence of aneurysmal dilatation.   ___________________________________________  Janetta Hora Fields, MD   AS/MEDQ  D:  01/23/2007  T:  01/23/2007  Job:  [id number]

## 2010-06-19 NOTE — Discharge Summary (Signed)
[name], [name]                ACCOUNT NO.:  [id number]   MEDICAL RECORD NO.:  [id number]          PATIENT TYPE:  INP   LOCATION:  5012                         FACILITY:  [facility]   PHYSICIAN:  Sharolyn Douglas, M.D.        DATE OF BIRTH:  October 04, 1926   DATE OF ADMISSION:  08/23/2006  DATE OF DISCHARGE:  08/28/2006                               DISCHARGE SUMMARY   ADMITTING DIAGNOSIS:  Left displaced superior-inferior pubic rami  fracture secondary to a fall.   DISCHARGE DIAGNOSIS:  Left displaced superior-inferior pubic rami  fracture secondary to a fall.   OPERATIONS/PROCEDURES:  X-rays:  CT scan of the pelvis with contrast to  rule out bladder tear or urethral tear.   BRIEF HISTORY:  The patient is a 77 year old white female who was taking  her husband to a doctor's appointment and fell in the parking lot  landing on her left hip, was unable to ambulate, came to the emergency  room via ambulance for evaluation.  X-rays revealed displaced superior-  inferior pubic rami fracture.  She had blood in her urine.  A CT scan of  pelvis with contrast was obtained to rule out a bladder tear, and this  was ruled out.  Urology consult was obtained.  The patient was admitted  for pain control, physical therapy, occupational therapy, and discharge  planning.  First day her post hospitalization urine was clear,  hemoglobin was 12.9, calf soft, nontender, started on Lovenox for DVT  prophylaxis, started with PT and occupational therapy.  The patient was  eating well.   HOSPITALIZATION:  Hospital day #3 continued to make progress with  therapy, ambulating non-weightbearing on the left with a walker. Durable  medical equipment was ordered as needed.  Continue on Lovenox.  Home  health physical therapy was arranged.  She was ready for discharge home  on August 28, 2006.  Eating well, voiding well, vital signs stable.  She  had some mild urinary incontinence which she was going to wear diapers  for.   Incentive spirometry and increased fluids for a mild fever.  Followup with Dr. Noel Gerold in 2 weeks.  Lovenox for 10 days, DVT  prophylaxis.  Urinalysis was negative.  Continued with progressive  ambulation, home health physical therapy, occupational therapy.   DISCHARGE MEDICATIONS:  Percocet, Colace, multivitamin, calcium, and  Protonix.  Continue on her home medications.   DISCHARGE CONDITION:  Stable and improved.   Contact our office prior to followup with any questions or concerns.  Chemistry indices and all lab indices can be obtained with primary  hospital record.  Hemoglobin 11.2, WBC 10.4.  PT 15.1, PTT 29.  Sodium  138, potassium 4.4.  Urinalysis was negative on August 27, 2006.      Aura Fey Bobbe Medico.      Sharolyn Douglas, M.D.     SCI/MEDQ  D:  11/03/2006  T:  11/03/2006  Job:  [id number]

## 2010-06-19 NOTE — Op Note (Signed)
[name], [name]                ACCOUNT NO.:  [id number]   MEDICAL RECORD NO.:  [id number]          PATIENT TYPE:  AMB   LOCATION:  SDS                          FACILITY:  [facility]   PHYSICIAN:  Angelia Mould. Derrell Lolling, M.D.DATE OF BIRTH:  21-Dec-1926   DATE OF PROCEDURE:  01/31/2006  DATE OF DISCHARGE:                               OPERATIVE REPORT   PREOPERATIVE DIAGNOSIS:  Hematoma right breast, breast cancer.   POSTOPERATIVE DIAGNOSIS:  Hematoma right breast, breast cancer.   OPERATION PERFORMED:  Incision drainage and evacuation of hematoma of  right breast and control of bleeders.   SURGEON:  Angelia Mould. Derrell Lolling, M.D.   OPERATIVE INDICATIONS:  This is a 77 year old white female who underwent  a right partial mastectomy about 2-1/2 to 3 weeks ago.  She has  developed a hematoma in the right breast, which is very large and  grapefruit size.  She stated that it was not hurting her very much.  She  had lots of bleeding.  I saw her in the office 2 days ago and there was  still a very huge, tight hematoma in the right breast, with the skin  being shiny and thinned out and atrophic; and a little bit tender, but  no sign of any infection.  I felt that it would be in her best interest  to have this evacuated so that she could heal and proceed with her  adjuvant radiation therapy.  She agreed with this.  She was brought to  operating room electively.   FINDINGS:  The patient had about a 500 cc, grapefruit-sized hematoma in  the right breast.  This was clotted blood.  Once I evacuated this out  completely and irrigated out the wound, there was several areas of raw  tissue which were bleeding just slightly.  There was no significant  bleeder.  Electrocautery controlled all of this.   OPERATIVE TECHNIQUE:  Following induction of general endotracheal  anesthesia, intravenous antibiotics were given prior to incision.  The  patient was identified as to correct patient, correct site and correct  procedure.  The right breast was then prepped and draped in sterile  fashion.  The curved transverse incision in the superior aspect the  right breast was incised, and we entered the hematoma cavity.  We  carefully evacuated and discarded all of the hematoma.  We cultured this  both aerobically and anaerobically.  We then irrigated this out  aggressively with 2 liters of saline.  We then went around the whole  wound inside and cauterized raw surfaces.  There was no arterial  bleeders or significant venous bleeders.  A 19-French Blake drain was  placed in the wound and brought out through a separate stab incision in  the inferolateral aspect of the inframammary crease.  This was sutured  to the skin with nylon suture, and connected to suction bulb.  The drain  was internally cut so as to fit the wound.  The skin was closed with  multiple interrupted sutures of 3-0 nylon.  The wound was cleansed,  dried, covered with Adaptic gauze, bulky  fluffy 4x4 gauze, ABDs, and a 6-  inch Ace wrap.   The patient tolerated the procedure well and was taken to recovery room  in stable condition.   ESTIMATED BLOOD LOSS:  10 cc of new ongoing blood loss, and about a 500  cc old hematoma cavity.      Angelia Mould. Derrell Lolling, M.D.  Electronically Signed     HMI/MEDQ  D:  01/31/2006  T:  01/31/2006  Job:  [id number]

## 2010-07-10 ENCOUNTER — Other Ambulatory Visit: Payer: Self-pay | Admitting: Oncology

## 2010-07-10 ENCOUNTER — Encounter (HOSPITAL_BASED_OUTPATIENT_CLINIC_OR_DEPARTMENT_OTHER): Payer: Medicare Other | Admitting: Oncology

## 2010-07-10 DIAGNOSIS — D473 Essential (hemorrhagic) thrombocythemia: Secondary | ICD-10-CM

## 2010-07-10 DIAGNOSIS — D0590 Unspecified type of carcinoma in situ of unspecified breast: Secondary | ICD-10-CM

## 2010-07-10 LAB — CBC WITH DIFFERENTIAL/PLATELET
BASO%: 0.1 % (ref 0.0–2.0)
Basophils Absolute: 0 10*3/uL (ref 0.0–0.1)
EOS%: 5 % (ref 0.0–7.0)
HGB: 14.7 g/dL (ref 11.6–15.9)
MCH: 28.4 pg (ref 25.1–34.0)
MCHC: 33.3 g/dL (ref 31.5–36.0)
RBC: 5.17 10*6/uL (ref 3.70–5.45)
RDW: 13.9 % (ref 11.2–14.5)
lymph#: 0.8 10*3/uL — ABNORMAL LOW (ref 0.9–3.3)

## 2010-08-10 ENCOUNTER — Encounter (HOSPITAL_BASED_OUTPATIENT_CLINIC_OR_DEPARTMENT_OTHER): Payer: Medicare Other | Admitting: Oncology

## 2010-08-10 ENCOUNTER — Other Ambulatory Visit: Payer: Self-pay | Admitting: Oncology

## 2010-08-10 DIAGNOSIS — D0590 Unspecified type of carcinoma in situ of unspecified breast: Secondary | ICD-10-CM

## 2010-08-10 DIAGNOSIS — D473 Essential (hemorrhagic) thrombocythemia: Secondary | ICD-10-CM

## 2010-08-10 LAB — CBC WITH DIFFERENTIAL/PLATELET
Basophils Absolute: 0 10*3/uL (ref 0.0–0.1)
Eosinophils Absolute: 0.3 10*3/uL (ref 0.0–0.5)
HGB: 16.5 g/dL — ABNORMAL HIGH (ref 11.6–15.9)
MONO#: 0.7 10*3/uL (ref 0.1–0.9)
NEUT#: 5.7 10*3/uL (ref 1.5–6.5)
RBC: 5.65 10*6/uL — ABNORMAL HIGH (ref 3.70–5.45)
RDW: 14.3 % (ref 11.2–14.5)
WBC: 7.8 10*3/uL (ref 3.9–10.3)
lymph#: 1 10*3/uL (ref 0.9–3.3)

## 2010-08-25 ENCOUNTER — Encounter: Payer: Self-pay | Admitting: Cardiovascular Disease

## 2010-08-27 ENCOUNTER — Encounter: Payer: Self-pay | Admitting: Cardiovascular Disease

## 2010-08-27 ENCOUNTER — Ambulatory Visit (INDEPENDENT_AMBULATORY_CARE_PROVIDER_SITE_OTHER): Payer: Medicare Other | Admitting: Cardiovascular Disease

## 2010-08-27 DIAGNOSIS — I059 Rheumatic mitral valve disease, unspecified: Secondary | ICD-10-CM

## 2010-08-27 DIAGNOSIS — I341 Nonrheumatic mitral (valve) prolapse: Secondary | ICD-10-CM

## 2010-08-27 DIAGNOSIS — I10 Essential (primary) hypertension: Secondary | ICD-10-CM | POA: Insufficient documentation

## 2010-08-27 NOTE — Assessment & Plan Note (Signed)
Her BP remains well controlled.   Continue current meds  

## 2010-08-27 NOTE — Assessment & Plan Note (Signed)
Stable

## 2010-08-27 NOTE — Progress Notes (Signed)
BRAZIL VOYTKO Date of Birth  1926/03/24 [facility] / [facility] [address]     [address] [place], [place]  [address] [phone]  Fax  [phone]  History of Present Illness:  Hx of MVP, HTN,  breast cancer, polycythemia.  No problems.  Gets phlebotomy every month if her HCT is > 45%.  Mild leg edema at night.  Current Outpatient Prescriptions on File Prior to Visit  Medication Sig Dispense Refill  . amLODipine (NORVASC) 2.5 MG tablet Take 2.5 mg by mouth daily.        Marland Kitchen aspirin 81 MG tablet Take 81 mg by mouth daily.        . Cetirizine HCl (ZYRTEC PO) Take by mouth as needed.        . Cholecalciferol (VITAMIN D3) 1000 UNITS CAPS Take 2,600 mg by mouth.       . FeFum-FePo-FA-B Cmp-C-Zn-Mn-Cu (RE DUALVIT PLUS PO) Take by mouth daily.        . fluticasone (FLONASE) 50 MCG/ACT nasal spray Place 2 sprays into the nose as needed.        . hydrochlorothiazide 25 MG tablet Take 1 tablet (25 mg total) by mouth daily. Take 1/2 tablet to 1 tablet daily  30 tablet  11  . lisinopril (PRINIVIL,ZESTRIL) 20 MG tablet Take 20 mg by mouth daily.        . metoprolol (TOPROL-XL) 50 MG 24 hr tablet Take 50 mg by mouth daily.        . Multiple Vitamin (MULTIVITAMIN) tablet Take 1 tablet by mouth daily.        . pantoprazole (PROTONIX) 40 MG tablet Take 40 mg by mouth as needed.        . potassium chloride (KLOR-CON) 10 MEQ CR tablet Take 10 mEq by mouth daily.          Allergies  Allergen Reactions  . Sulfur     Past Medical History  Diagnosis Date  . MVP (mitral valve prolapse)   . Breast cancer   . Polycythemia   . Hypertension   . Chest tightness   . Palpitation   . PVC's (premature ventricular contractions)     Past Surgical History  Procedure Date  . Tonsillectomy   . Appendectomy   . Cardiovascular stress test 12/23/2000    EF 70%  . Transthoracic echocardiogram 03/05/2010    EF 55-60%    History  Smoking status  . Former Smoker  . Quit  date: 08/24/1980  Smokeless tobacco  . Not on file    History  Alcohol Use No    Family History  Problem Relation Age of Onset  . Cancer Mother   . Hypertension Father   . Stroke Father   . Hypertension Sister   . Hypertension Brother   . Prostate cancer Brother     Reviw of Systems:  Reviewed in the HPI.  All other systems are negative.  Physical Exam: BP 120/66  Pulse 72  Ht 5\' 4"  (1.626 m)  Wt 103 lb 3.2 oz (46.811 kg)  BMI 17.71 kg/m2 The patient is alert and oriented x 3.  The mood and affect are normal.   Skin: warm and dry.  Color is normal.    HEENT:   the sclera are nonicteric.  The mucous membranes are moist.  The carotids are 2+ without bruits.  There is no thyromegaly.  There is no JVD.    Lungs: clear.  The chest wall is  non tender.    Heart: regular rate with a normal S1 and S2.  There is a soft murmur. The PMI is not displaced.     Abdomen: good bowel sounds.  There is no guarding or rebound.  There is no hepatosplenomegaly or tenderness.  There are no masses.   Extremities:  no clubbing, cyanosis, or edema.  The legs are without rashes.  The distal pulses are intact.   Neuro:  Cranial nerves II - XII are intact.  Motor and sensory functions are intact.    The gait is normal.  ECG:  Assessment / Plan:

## 2010-09-10 ENCOUNTER — Other Ambulatory Visit: Payer: Self-pay | Admitting: Oncology

## 2010-09-10 ENCOUNTER — Encounter (HOSPITAL_BASED_OUTPATIENT_CLINIC_OR_DEPARTMENT_OTHER): Payer: Medicare Other | Admitting: Oncology

## 2010-09-10 DIAGNOSIS — D473 Essential (hemorrhagic) thrombocythemia: Secondary | ICD-10-CM

## 2010-09-10 DIAGNOSIS — D0590 Unspecified type of carcinoma in situ of unspecified breast: Secondary | ICD-10-CM

## 2010-09-10 LAB — COMPREHENSIVE METABOLIC PANEL
ALT: 15 U/L (ref 0–35)
CO2: 27 mEq/L (ref 19–32)
Calcium: 9.7 mg/dL (ref 8.4–10.5)
Chloride: 100 mEq/L (ref 96–112)
Glucose, Bld: 98 mg/dL (ref 70–99)
Sodium: 134 mEq/L — ABNORMAL LOW (ref 135–145)
Total Protein: 6.7 g/dL (ref 6.0–8.3)

## 2010-09-10 LAB — CBC WITH DIFFERENTIAL/PLATELET
BASO%: 0.4 % (ref 0.0–2.0)
EOS%: 5 % (ref 0.0–7.0)
HCT: 48.9 % — ABNORMAL HIGH (ref 34.8–46.6)
LYMPH%: 12.3 % — ABNORMAL LOW (ref 14.0–49.7)
MCH: 29.1 pg (ref 25.1–34.0)
MCHC: 33.7 g/dL (ref 31.5–36.0)
MONO#: 0.8 10*3/uL (ref 0.1–0.9)
NEUT%: 71.9 % (ref 38.4–76.8)
RBC: 5.66 10*6/uL — ABNORMAL HIGH (ref 3.70–5.45)
WBC: 8 10*3/uL (ref 3.9–10.3)
lymph#: 1 10*3/uL (ref 0.9–3.3)

## 2010-09-10 LAB — MORPHOLOGY

## 2010-09-10 LAB — CHCC SMEAR

## 2010-09-11 ENCOUNTER — Encounter (HOSPITAL_BASED_OUTPATIENT_CLINIC_OR_DEPARTMENT_OTHER): Payer: Medicare Other | Admitting: Oncology

## 2010-09-14 ENCOUNTER — Encounter (HOSPITAL_BASED_OUTPATIENT_CLINIC_OR_DEPARTMENT_OTHER): Payer: Medicare Other | Admitting: Oncology

## 2010-09-14 ENCOUNTER — Other Ambulatory Visit: Payer: Self-pay | Admitting: Oncology

## 2010-09-14 DIAGNOSIS — D0590 Unspecified type of carcinoma in situ of unspecified breast: Secondary | ICD-10-CM

## 2010-09-14 DIAGNOSIS — D473 Essential (hemorrhagic) thrombocythemia: Secondary | ICD-10-CM

## 2010-09-14 LAB — CBC WITH DIFFERENTIAL/PLATELET
Basophils Absolute: 0 10*3/uL (ref 0.0–0.1)
HCT: 43.8 % (ref 34.8–46.6)
HGB: 14.7 g/dL (ref 11.6–15.9)
MONO#: 0.7 10*3/uL (ref 0.1–0.9)
NEUT%: 78 % — ABNORMAL HIGH (ref 38.4–76.8)
WBC: 8.6 10*3/uL (ref 3.9–10.3)
lymph#: 0.9 10*3/uL (ref 0.9–3.3)

## 2010-10-15 ENCOUNTER — Other Ambulatory Visit: Payer: Self-pay | Admitting: Oncology

## 2010-10-15 ENCOUNTER — Encounter (HOSPITAL_BASED_OUTPATIENT_CLINIC_OR_DEPARTMENT_OTHER): Payer: Medicare Other | Admitting: Oncology

## 2010-10-15 DIAGNOSIS — C50119 Malignant neoplasm of central portion of unspecified female breast: Secondary | ICD-10-CM

## 2010-10-15 DIAGNOSIS — D473 Essential (hemorrhagic) thrombocythemia: Secondary | ICD-10-CM

## 2010-10-15 LAB — CBC WITH DIFFERENTIAL/PLATELET
BASO%: 0.1 % (ref 0.0–2.0)
LYMPH%: 11.7 % — ABNORMAL LOW (ref 14.0–49.7)
MCHC: 33.5 g/dL (ref 31.5–36.0)
MONO#: 0.7 10*3/uL (ref 0.1–0.9)
NEUT#: 5.9 10*3/uL (ref 1.5–6.5)
RBC: 5.49 10*6/uL — ABNORMAL HIGH (ref 3.70–5.45)
RDW: 14.7 % — ABNORMAL HIGH (ref 11.2–14.5)
WBC: 7.9 10*3/uL (ref 3.9–10.3)
lymph#: 0.9 10*3/uL (ref 0.9–3.3)

## 2010-11-11 ENCOUNTER — Encounter: Payer: Self-pay | Admitting: *Deleted

## 2010-11-13 ENCOUNTER — Encounter (HOSPITAL_BASED_OUTPATIENT_CLINIC_OR_DEPARTMENT_OTHER): Payer: Medicare Other | Admitting: Oncology

## 2010-11-13 ENCOUNTER — Other Ambulatory Visit: Payer: Self-pay | Admitting: Oncology

## 2010-11-13 DIAGNOSIS — C50119 Malignant neoplasm of central portion of unspecified female breast: Secondary | ICD-10-CM

## 2010-11-13 LAB — CBC WITH DIFFERENTIAL/PLATELET
BASO%: 0.1 % (ref 0.0–2.0)
Eosinophils Absolute: 0.3 10*3/uL (ref 0.0–0.5)
HCT: 46.4 % (ref 34.8–46.6)
HGB: 15.5 g/dL (ref 11.6–15.9)
MCHC: 33.4 g/dL (ref 31.5–36.0)
MONO#: 0.8 10*3/uL (ref 0.1–0.9)
NEUT#: 7.1 10*3/uL — ABNORMAL HIGH (ref 1.5–6.5)
NEUT%: 78.3 % — ABNORMAL HIGH (ref 38.4–76.8)
WBC: 9.1 10*3/uL (ref 3.9–10.3)
lymph#: 0.9 10*3/uL (ref 0.9–3.3)

## 2010-11-16 LAB — URINE MICROSCOPIC-ADD ON

## 2010-11-16 LAB — I-STAT 8, (EC8 V) (CONVERTED LAB)
Chloride: 103
Glucose, Bld: 115 — ABNORMAL HIGH
HCT: 48 — ABNORMAL HIGH
Potassium: 4.7
pH, Ven: 7.334 — ABNORMAL HIGH

## 2010-11-16 LAB — CBC
HCT: 34 — ABNORMAL LOW
HCT: 47 — ABNORMAL HIGH
Hemoglobin: 12.9
Hemoglobin: 15.8 — ABNORMAL HIGH
MCHC: 33
MCHC: 33.1
MCV: 86.4
RBC: 3.93
RBC: 4.51
RDW: 15.3 — ABNORMAL HIGH
WBC: 11.8 — ABNORMAL HIGH

## 2010-11-16 LAB — COMPREHENSIVE METABOLIC PANEL
Alkaline Phosphatase: 72
BUN: 16
Chloride: 103
Glucose, Bld: 134 — ABNORMAL HIGH
Potassium: 4.4
Total Bilirubin: 0.9

## 2010-11-16 LAB — DIFFERENTIAL
Basophils Absolute: 0
Basophils Relative: 0
Neutro Abs: 19.3 — ABNORMAL HIGH
Neutrophils Relative %: 92 — ABNORMAL HIGH

## 2010-11-16 LAB — URINALYSIS, ROUTINE W REFLEX MICROSCOPIC
Bilirubin Urine: NEGATIVE
Glucose, UA: NEGATIVE
Ketones, ur: NEGATIVE
Protein, ur: NEGATIVE

## 2010-11-16 LAB — PROTIME-INR: INR: 1.2

## 2010-11-16 LAB — URINE CULTURE
Colony Count: 85000
Special Requests: NEGATIVE

## 2010-11-16 LAB — BASIC METABOLIC PANEL
CO2: 28
Calcium: 8.2 — ABNORMAL LOW
Chloride: 107
GFR calc Af Amer: >60
Sodium: 138

## 2010-11-16 LAB — URINALYSIS, MICROSCOPIC ONLY
Bilirubin Urine: NEGATIVE
Hgb urine dipstick: NEGATIVE
Ketones, ur: NEGATIVE
Nitrite: NEGATIVE
Urobilinogen, UA: 0.2

## 2010-11-16 LAB — APTT: aPTT: 29

## 2010-12-13 ENCOUNTER — Other Ambulatory Visit: Payer: Self-pay | Admitting: Oncology

## 2010-12-13 DIAGNOSIS — D751 Secondary polycythemia: Secondary | ICD-10-CM

## 2010-12-13 DIAGNOSIS — D649 Anemia, unspecified: Secondary | ICD-10-CM

## 2010-12-14 ENCOUNTER — Other Ambulatory Visit: Payer: Self-pay | Admitting: Oncology

## 2010-12-14 ENCOUNTER — Ambulatory Visit (HOSPITAL_BASED_OUTPATIENT_CLINIC_OR_DEPARTMENT_OTHER): Payer: Medicare Other

## 2010-12-14 ENCOUNTER — Encounter: Payer: Self-pay | Admitting: Oncology

## 2010-12-14 ENCOUNTER — Other Ambulatory Visit (HOSPITAL_BASED_OUTPATIENT_CLINIC_OR_DEPARTMENT_OTHER): Payer: Medicare Other

## 2010-12-14 DIAGNOSIS — D751 Secondary polycythemia: Secondary | ICD-10-CM

## 2010-12-14 DIAGNOSIS — C50119 Malignant neoplasm of central portion of unspecified female breast: Secondary | ICD-10-CM

## 2010-12-14 DIAGNOSIS — D473 Essential (hemorrhagic) thrombocythemia: Secondary | ICD-10-CM

## 2010-12-14 LAB — CBC WITH DIFFERENTIAL/PLATELET
Basophils Absolute: 0 10*3/uL (ref 0.0–0.1)
EOS%: 3.3 % (ref 0.0–7.0)
Eosinophils Absolute: 0.2 10*3/uL (ref 0.0–0.5)
HCT: 48.3 % — ABNORMAL HIGH (ref 34.8–46.6)
HGB: 16 g/dL — ABNORMAL HIGH (ref 11.6–15.9)
MCH: 28.9 pg (ref 25.1–34.0)
MCV: 86.9 fL (ref 79.5–101.0)
MONO%: 9.4 % (ref 0.0–14.0)
NEUT#: 5.5 10*3/uL (ref 1.5–6.5)
NEUT%: 75.9 % (ref 38.4–76.8)
lymph#: 0.8 10*3/uL — ABNORMAL LOW (ref 0.9–3.3)

## 2010-12-14 NOTE — Progress Notes (Signed)
Dr Donnie Coffin  In to see patient and is aware of elevated blood pressure.

## 2011-01-13 ENCOUNTER — Other Ambulatory Visit (HOSPITAL_BASED_OUTPATIENT_CLINIC_OR_DEPARTMENT_OTHER): Payer: Medicare Other | Admitting: Lab

## 2011-01-13 ENCOUNTER — Ambulatory Visit (HOSPITAL_BASED_OUTPATIENT_CLINIC_OR_DEPARTMENT_OTHER): Payer: Medicare Other

## 2011-01-13 ENCOUNTER — Other Ambulatory Visit: Payer: Self-pay | Admitting: Oncology

## 2011-01-13 DIAGNOSIS — C50119 Malignant neoplasm of central portion of unspecified female breast: Secondary | ICD-10-CM

## 2011-01-13 DIAGNOSIS — D751 Secondary polycythemia: Secondary | ICD-10-CM

## 2011-01-13 LAB — CBC WITH DIFFERENTIAL/PLATELET
EOS%: 3.2 % (ref 0.0–7.0)
MCH: 28.5 pg (ref 25.1–34.0)
MCHC: 33.3 g/dL (ref 31.5–36.0)
MCV: 85.6 fL (ref 79.5–101.0)
MONO%: 6.8 % (ref 0.0–14.0)
NEUT#: 7.5 10*3/uL — ABNORMAL HIGH (ref 1.5–6.5)
RBC: 5.69 10*6/uL — ABNORMAL HIGH (ref 3.70–5.45)
RDW: 14.5 % (ref 11.2–14.5)
nRBC: 0 % (ref 0–0)

## 2011-01-13 NOTE — Patient Instructions (Signed)
Patient discharged home with no complaints.

## 2011-01-13 NOTE — Progress Notes (Signed)
Patient requires phlebotomy today because Hct = 48; phlebotomy completed with no complaints; removed 500cc blood; pre and post vital signs; observation for 30 mins post phlebotomy

## 2011-01-16 ENCOUNTER — Telehealth: Payer: Self-pay | Admitting: Oncology

## 2011-01-16 NOTE — Telephone Encounter (Signed)
per pof 08/13 called pt lmovm for appts for jan-feb2013 and to rtn call to confirm appts

## 2011-01-19 ENCOUNTER — Telehealth: Payer: Self-pay | Admitting: Oncology

## 2011-01-19 NOTE — Telephone Encounter (Signed)
pt rtn call on 12/17 and confirmed appts for jan-feb2013

## 2011-02-09 ENCOUNTER — Other Ambulatory Visit: Payer: Self-pay | Admitting: *Deleted

## 2011-02-09 MED ORDER — POTASSIUM CHLORIDE ER 10 MEQ PO TBCR: 10.0000 meq | EXTENDED_RELEASE_TABLET | Freq: Every day | ORAL | Status: DC

## 2011-02-09 MED ORDER — LISINOPRIL 20 MG PO TABS: 20.0000 mg | ORAL_TABLET | Freq: Every day | ORAL | Status: DC

## 2011-02-11 ENCOUNTER — Other Ambulatory Visit: Payer: Self-pay | Admitting: *Deleted

## 2011-02-11 DIAGNOSIS — D751 Secondary polycythemia: Secondary | ICD-10-CM

## 2011-02-12 ENCOUNTER — Ambulatory Visit (HOSPITAL_BASED_OUTPATIENT_CLINIC_OR_DEPARTMENT_OTHER): Payer: Medicare Other

## 2011-02-12 ENCOUNTER — Other Ambulatory Visit: Payer: Self-pay | Admitting: Physician Assistant

## 2011-02-12 ENCOUNTER — Other Ambulatory Visit (HOSPITAL_BASED_OUTPATIENT_CLINIC_OR_DEPARTMENT_OTHER): Payer: Medicare Other | Admitting: Lab

## 2011-02-12 VITALS — BP 145/77 | HR 71 | Temp 97.6°F

## 2011-02-12 DIAGNOSIS — D751 Secondary polycythemia: Secondary | ICD-10-CM

## 2011-02-12 DIAGNOSIS — D649 Anemia, unspecified: Secondary | ICD-10-CM

## 2011-02-12 DIAGNOSIS — D0590 Unspecified type of carcinoma in situ of unspecified breast: Secondary | ICD-10-CM

## 2011-02-12 LAB — CBC WITH DIFFERENTIAL/PLATELET
BASO%: 0.1 % (ref 0.0–2.0)
Basophils Absolute: 0 10*3/uL (ref 0.0–0.1)
HCT: 47.2 % — ABNORMAL HIGH (ref 34.8–46.6)
HGB: 15.6 g/dL (ref 11.6–15.9)
LYMPH%: 13.3 % — ABNORMAL LOW (ref 14.0–49.7)
MCHC: 33.1 g/dL (ref 31.5–36.0)
MONO#: 0.7 10*3/uL (ref 0.1–0.9)
NEUT%: 72.8 % (ref 38.4–76.8)
Platelets: 617 10*3/uL — ABNORMAL HIGH (ref 145–400)
WBC: 7.8 10*3/uL (ref 3.9–10.3)
lymph#: 1 10*3/uL (ref 0.9–3.3)

## 2011-02-12 LAB — LACTATE DEHYDROGENASE: LDH: 172 U/L (ref 94–250)

## 2011-02-12 LAB — COMPREHENSIVE METABOLIC PANEL
ALT: 18 U/L (ref 0–35)
AST: 23 U/L (ref 0–37)
CO2: 29 mEq/L (ref 19–32)
Calcium: 10.3 mg/dL (ref 8.4–10.5)
Chloride: 99 mEq/L (ref 96–112)
Creatinine, Ser: 1.23 mg/dL — ABNORMAL HIGH (ref 0.50–1.10)
Potassium: 4.8 mEq/L (ref 3.5–5.3)
Sodium: 137 mEq/L (ref 135–145)
Total Protein: 7 g/dL (ref 6.0–8.3)

## 2011-02-12 LAB — IRON AND TIBC
Iron: 36 ug/dL — ABNORMAL LOW (ref 42–145)
UIBC: 317 ug/dL (ref 125–400)

## 2011-02-12 NOTE — Patient Instructions (Signed)
Follow up as scheduled on 03/18/11  Refer to this sheet in the next few weeks. These instructions provide you with information on caring for yourself after your procedure. Your caregiver may also give you more specific instructions. Your treatment has been planned according to current medical practices, but problems sometimes occur. Call your caregiver if you have any problems or questions after your procedure. HOME CARE INSTRUCTIONS Most people can go back to their normal activities right away. Before you leave, be sure to ask if there is anything you should or should not do. In general, it would be wise to:  Keep the bandage dry. You can remove the bandage after about 5 hours.   Eat well-balanced meals for the next 24 hours.   Drink enough fluids to keep your urine clear or pale yellow.   Avoid drinking alcohol minimally until after eating.   Avoid smoking for at least 30 minutes after the procedure.   Avoid strenous physical activity or heavy lifting or pulling for about 5 hours after the procedure.   Athletes should avoid strenous exercise for 12 hours or more.   Change positions slowly for the remainder of the day to prevent lightheadedness or fainting.   If you feel lightheaded, lie down until the feeling subsides.   If you have bleeding from the needle insertion site, elevate your arm and press firmly on the site until the bleeding stops.   If bruising or bleeding appears under the skin, apply ice to the area for 15 to 20 minutes, 3 to 4 times per day. Put the ice in a plastic bag and place a towel between the bag of ice and your skin. Do this while you are awake for the first 24 hours. The ice packs can be stopped before 24 hours if the swelling goes away. If swelling persists after 24 hours, a warm, moist washcloth can be applied to the area for 15 to 20 minutes, 3 to 4 times per day. The warm, moist treatments can be stopped when the swelling goes away.   It is important to  continue further therapeutic phlebotomy as directed by your caregiver.  SEEK MEDICAL CARE IF:  There is bleeding or fluid leaking from the needle insertion site.   The needle insertion site becomes swollen, red, or sore.   You feel lightheaded, dizzy or nauseated, and the feeling does not go away.   You notice new bruising at the needle insertion site.   You feel more weak or tired than normal.   You develop a fever.  SEEK IMMEDIATE MEDICAL CARE IF:   There is increased bleeding, pain, or swelling from the needle insertion site.   You have severe nausea or vomiting.   You have chest pain.   You have trouble breathing.  MAKE SURE YOU:  Understand these instructions.   Will watch your condition.   Will get help right away if you are not doing well or get worse.  Document Released: 06/22/2010 Document Revised: 09/30/2010 Document Reviewed: 06/22/2010 [facility] Patient Information 2012 [place], [place].

## 2011-02-12 NOTE — Progress Notes (Signed)
Phlebotomy of 518cc blood from right anticubital viein without difficulty. Procedure started at 1025 and completed at 10:40 am. Tolerated without adverse event.  Drinking fluids and eating snack.  @1115 --D/C home alone in good condition/ambulatory. Reviewed post phlebotomy instructions with patient.

## 2011-02-24 ENCOUNTER — Encounter: Payer: Self-pay | Admitting: Cardiovascular Disease

## 2011-02-24 ENCOUNTER — Ambulatory Visit (INDEPENDENT_AMBULATORY_CARE_PROVIDER_SITE_OTHER): Payer: Medicare Other | Admitting: Cardiovascular Disease

## 2011-02-24 DIAGNOSIS — I10 Essential (primary) hypertension: Secondary | ICD-10-CM

## 2011-02-24 DIAGNOSIS — I059 Rheumatic mitral valve disease, unspecified: Secondary | ICD-10-CM

## 2011-02-24 DIAGNOSIS — I341 Nonrheumatic mitral (valve) prolapse: Secondary | ICD-10-CM

## 2011-02-24 DIAGNOSIS — E785 Hyperlipidemia, unspecified: Secondary | ICD-10-CM

## 2011-02-24 NOTE — Patient Instructions (Signed)
Your physician wants you to follow-up in: 1 year  You will receive a reminder letter in the mail two months in advance. If you don't receive a letter, please call our office to schedule the follow-up appointment.   Your physician recommends that you return for a FASTING lipid profile: 1 year   

## 2011-02-24 NOTE — Progress Notes (Signed)
[name] Date of Birth  09-04-1926 [facility]  [address]    [address]   [address] [place], [place]  [address]    [place], [place]  [address] [phone]  Fax  [phone]  [phone]  Fax [phone]   History of Present Illness:  Problem list: 1. Mitral valve prolapse 2. Breast cancer 3. Polycythemia 4. Hypertension  [name] is an 78 year old female with a history of mitral valve prolapse, hypertension, and polycythemia. She has lobotomy about once a month for her polycythemia. Her blood pressure typically is little bit elevated and then goes down quickly after her phlebotomy. She's feeling quite well. She's exercising regularly.   Current Outpatient Prescriptions on File Prior to Visit  Medication Sig Dispense Refill  . amLODipine (NORVASC) 2.5 MG tablet Take 2.5 mg by mouth daily. Taking 0.5 Tablet Daily      . anagrelide (AGRYLIN) 0.5 MG capsule Take 0.5 mg by mouth.        Marland Kitchen aspirin 81 MG tablet Take 81 mg by mouth daily.        . Cetirizine HCl (ZYRTEC PO) Take by mouth as needed.        . Cholecalciferol (VITAMIN D3) 1000 UNITS CAPS Take 2,600 mg by mouth.       . FeFum-FePo-FA-B Cmp-C-Zn-Mn-Cu (RE DUALVIT PLUS PO) Take by mouth daily.        . fluticasone (FLONASE) 50 MCG/ACT nasal spray Place 2 sprays into the nose as needed.        Marland Kitchen lisinopril (PRINIVIL,ZESTRIL) 20 MG tablet Take 1 tablet (20 mg total) by mouth daily.  30 tablet  6  . metoprolol (TOPROL-XL) 50 MG 24 hr tablet Take 50 mg by mouth daily.        . Multiple Vitamin (MULTIVITAMIN) tablet Take 1 tablet by mouth daily.        . pantoprazole (PROTONIX) 40 MG tablet Take 40 mg by mouth as needed.        . potassium chloride (K-DUR) 10 MEQ tablet Take 1 tablet (10 mEq total) by mouth daily.  30 tablet  6    Allergies  Allergen Reactions  . Sulfur     Past Medical History  Diagnosis Date  . MVP (mitral valve prolapse)   . Polycythemia   .  Hypertension   . Chest tightness   . Palpitation   . PVC's (premature ventricular contractions)   . Breast cancer     Past Surgical History  Procedure Date  . Tonsillectomy   . Appendectomy   . Cardiovascular stress test 12/23/2000    EF 70%  . Transthoracic echocardiogram 03/05/2010    EF 55-60%    History  Smoking status  . Former Smoker  . Quit date: 08/24/1980  Smokeless tobacco  . Not on file    History  Alcohol Use No    Family History  Problem Relation Age of Onset  . Cancer Mother   . Hypertension Father   . Stroke Father   . Hypertension Sister   . Hypertension Brother   . Prostate cancer Brother     Reviw of Systems:  Reviewed in the HPI.  All other systems are negative.  Physical Exam: BP 156/86  Pulse 68  Ht 5\' 4"  (1.626 m)  Wt 103 lb (46.72 kg)  BMI 17.68 kg/m2 The patient is alert and oriented x 3.  The mood and affect are normal.  Skin: warm and dry.  Color is normal.    HEENT:   Neck is supple. His membranes are moist. The carotids are normal. No JVD.  Lungs: Lungs are clear.   Heart: Regular rate S1-S2.  She is a soft systolic murmur.  Abdomen: She is thin. She has good bowel sounds. There are no bruits   Extremities:  No clubbing cyanosis or edema  Neuro:  Nonfocal.    ECG: Normal sinus rhythm. She has a right axis deviation. The EKG is normal.  Assessment / Plan:

## 2011-02-24 NOTE — Assessment & Plan Note (Signed)
Her mitral valve prolapse is stable. 

## 2011-02-24 NOTE — Assessment & Plan Note (Signed)
Her blood pressures little elevated today. We'll continue with her same medications. She states that typically goes up and down depending on whether or not she is due for a phlebotomy.

## 2011-03-02 ENCOUNTER — Other Ambulatory Visit: Payer: Self-pay | Admitting: *Deleted

## 2011-03-03 ENCOUNTER — Other Ambulatory Visit: Payer: Self-pay | Admitting: *Deleted

## 2011-03-03 MED ORDER — METOPROLOL SUCCINATE ER 50 MG PO TB24: 50.0000 mg | ORAL_TABLET | Freq: Every day | ORAL | Status: DC

## 2011-03-03 NOTE — Telephone Encounter (Signed)
Fax Received. Refill Completed. [name] (R.M.A)   

## 2011-03-04 ENCOUNTER — Other Ambulatory Visit: Payer: Self-pay

## 2011-03-04 DIAGNOSIS — L819 Disorder of pigmentation, unspecified: Secondary | ICD-10-CM | POA: Diagnosis not present

## 2011-03-04 DIAGNOSIS — L820 Inflamed seborrheic keratosis: Secondary | ICD-10-CM | POA: Diagnosis not present

## 2011-03-04 DIAGNOSIS — D239 Other benign neoplasm of skin, unspecified: Secondary | ICD-10-CM | POA: Diagnosis not present

## 2011-03-04 DIAGNOSIS — B351 Tinea unguium: Secondary | ICD-10-CM | POA: Diagnosis not present

## 2011-03-04 DIAGNOSIS — D751 Secondary polycythemia: Secondary | ICD-10-CM

## 2011-03-04 DIAGNOSIS — D1801 Hemangioma of skin and subcutaneous tissue: Secondary | ICD-10-CM | POA: Diagnosis not present

## 2011-03-04 MED ORDER — ANAGRELIDE HCL 0.5 MG PO CAPS: 0.5000 mg | ORAL_CAPSULE | Freq: Two times a day (BID) | ORAL | Status: DC

## 2011-03-09 ENCOUNTER — Telehealth: Payer: Self-pay | Admitting: Cardiovascular Disease

## 2011-03-09 ENCOUNTER — Other Ambulatory Visit: Payer: Self-pay | Admitting: *Deleted

## 2011-03-09 MED ORDER — METOPROLOL SUCCINATE ER 50 MG PO TB24: 50.0000 mg | ORAL_TABLET | Freq: Every day | ORAL | Status: DC

## 2011-03-09 NOTE — Telephone Encounter (Signed)
Refilled metoprolol 

## 2011-03-09 NOTE — Telephone Encounter (Signed)
New problem Pt hasnt received metoprolol refill. please resend to [facility]. She doesn't have any more pills

## 2011-03-10 ENCOUNTER — Other Ambulatory Visit: Payer: Self-pay | Admitting: *Deleted

## 2011-03-11 ENCOUNTER — Other Ambulatory Visit: Payer: Self-pay | Admitting: Cardiovascular Disease

## 2011-03-11 MED ORDER — METOPROLOL SUCCINATE ER 50 MG PO TB24: ORAL_TABLET | ORAL | Status: DC

## 2011-03-18 ENCOUNTER — Telehealth: Payer: Self-pay | Admitting: *Deleted

## 2011-03-18 ENCOUNTER — Ambulatory Visit: Payer: Medicare Other | Admitting: Oncology

## 2011-03-18 ENCOUNTER — Ambulatory Visit (HOSPITAL_BASED_OUTPATIENT_CLINIC_OR_DEPARTMENT_OTHER): Payer: Medicare Other

## 2011-03-18 ENCOUNTER — Other Ambulatory Visit: Payer: Medicare Other | Admitting: Lab

## 2011-03-18 VITALS — BP 149/75 | HR 68 | Temp 97.8°F | Ht 64.5 in | Wt 103.7 lb

## 2011-03-18 DIAGNOSIS — D751 Secondary polycythemia: Secondary | ICD-10-CM

## 2011-03-18 DIAGNOSIS — D0590 Unspecified type of carcinoma in situ of unspecified breast: Secondary | ICD-10-CM

## 2011-03-18 DIAGNOSIS — D473 Essential (hemorrhagic) thrombocythemia: Secondary | ICD-10-CM | POA: Diagnosis not present

## 2011-03-18 LAB — CBC WITH DIFFERENTIAL/PLATELET
BASO%: 0.1 % (ref 0.0–2.0)
LYMPH%: 9 % — ABNORMAL LOW (ref 14.0–49.7)
MCHC: 32.6 g/dL (ref 31.5–36.0)
MCV: 85.7 fL (ref 79.5–101.0)
MONO#: 0.5 10*3/uL (ref 0.1–0.9)
MONO%: 6.3 % (ref 0.0–14.0)
Platelets: 561 10*3/uL — ABNORMAL HIGH (ref 145–400)
RBC: 5.45 10*6/uL (ref 3.70–5.45)
WBC: 7.8 10*3/uL (ref 3.9–10.3)

## 2011-03-18 LAB — COMPREHENSIVE METABOLIC PANEL
ALT: 15 U/L (ref 0–35)
AST: 21 U/L (ref 0–37)
Albumin: 4.1 g/dL (ref 3.5–5.2)
Alkaline Phosphatase: 66 U/L (ref 39–117)
Chloride: 101 mEq/L (ref 96–112)
Potassium: 4.7 mEq/L (ref 3.5–5.3)
Sodium: 138 mEq/L (ref 135–145)
Total Protein: 6.4 g/dL (ref 6.0–8.3)

## 2011-03-18 NOTE — Telephone Encounter (Signed)
made patient appointments for lab and phelb printed out calendar and gave to the patient

## 2011-03-18 NOTE — Progress Notes (Signed)
Hematology and Oncology Follow Up Visit  [name] [id number] 04-04-1926 78 y.o. 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 Is taking po iron Medications: I have reviewed the patient's current medications.  Allergies:  Allergies  Allergen Reactions  . Sulfur     Past Medical History, Surgical history, Social history, and Family History were reviewed and updated.  Review of Systems: Constitutional:  Negative for fever, chills, night sweats, anorexia, weight loss, pain. Cardiovascular: no chest pain or dyspnea on exertion Respiratory: no cough, shortness of breath, or wheezing Neurological: negative Dermatological: negative ENT: negative Skin Gastrointestinal: no abdominal pain, change in bowel habits, or black or bloody stools Genito-Urinary: negative Hematological and Lymphatic: negative Breast: negative Musculoskeletal: negative Remaining ROS negative.  Physical Exam: Blood pressure 149/75, pulse 68, temperature 97.8 F (36.6 C), temperature source Oral, height 5' 4.5" (1.638 m), weight 103 lb 11.2 oz (47.038 kg). ECOG: 0 General appearance: alert, cooperative and appears stated age Head: Normocephalic, without obvious abnormality, atraumatic Neck: no adenopathy, no carotid bruit, no JVD, supple, symmetrical, trachea midline and thyroid not enlarged, symmetric, no tenderness/mass/nodules Lymph nodes: Cervical, supraclavicular, and axillary nodes normal. Cardiac : regular rate and rhythm, no murmurs or gallops Pulmonary:clear to auscultation bilaterally and normal percussion bilaterally Breasts: inspection negative, no nipple discharge or bleeding, no masses or nodularity palpable Abdomen:soft, non-tender; bowel sounds normal; no masses,  no organomegaly Extremities negative Neuro:  alert, oriented, normal speech, no focal findings or movement disorder noted  Lab Results: Lab Results  Component Value Date   WBC 7.8 03/18/2011   HGB 15.2 03/18/2011   HCT 46.7* 03/18/2011   MCV 85.7 03/18/2011   PLT 561* 03/18/2011     Chemistry      Component Value Date/Time   NA 137 02/12/2011 0920   K 4.8 02/12/2011 0920   CL 99 02/12/2011 0920   CO2 29 02/12/2011 0920   BUN 18 02/12/2011 0920   CREATININE 1.23* 02/12/2011 0920      Component Value Date/Time   CALCIUM 10.3 02/12/2011 0920   ALKPHOS 66 02/12/2011 0920   AST 23 02/12/2011 0920   ALT 18 02/12/2011 0920   BILITOT 0.4 02/12/2011 0920      .pathology. Radiological Studies: chest X-ray n/a Mammogram n/a Bone density n/a  Impression and Plan: [name] is doing well, I will continue to see i her 3 mo intervals with monthly phlebotomies. She might be a candidate for hydrea at some point.  More than 50% of the visit was spent in patient-related counselling   Pierce Crane, MD 2/14/201311:24 AM

## 2011-03-18 NOTE — Progress Notes (Signed)
[id number] Per MD orders, phlebotomy for HCT above 45% Today pt HCT 46.7% Phlebotomy to right AC tolerated well.  520cc blood extracted and pt eating snack/drinks provided.  1225 Pt tolerated procedure well and vitals stable upon discharge.

## 2011-03-31 ENCOUNTER — Other Ambulatory Visit: Payer: Self-pay | Admitting: Medical Oncology

## 2011-03-31 DIAGNOSIS — D751 Secondary polycythemia: Secondary | ICD-10-CM

## 2011-03-31 MED ORDER — ANAGRELIDE HCL 0.5 MG PO CAPS: 0.5000 mg | ORAL_CAPSULE | Freq: Two times a day (BID) | ORAL | Status: DC

## 2011-04-15 ENCOUNTER — Other Ambulatory Visit (HOSPITAL_BASED_OUTPATIENT_CLINIC_OR_DEPARTMENT_OTHER): Payer: Medicare Other | Admitting: Lab

## 2011-04-15 ENCOUNTER — Other Ambulatory Visit: Payer: Self-pay | Admitting: Physician Assistant

## 2011-04-15 ENCOUNTER — Ambulatory Visit (HOSPITAL_BASED_OUTPATIENT_CLINIC_OR_DEPARTMENT_OTHER): Payer: Medicare Other

## 2011-04-15 DIAGNOSIS — D0590 Unspecified type of carcinoma in situ of unspecified breast: Secondary | ICD-10-CM

## 2011-04-15 DIAGNOSIS — D751 Secondary polycythemia: Secondary | ICD-10-CM

## 2011-04-15 LAB — CBC WITH DIFFERENTIAL/PLATELET
BASO%: 0.1 % (ref 0.0–2.0)
HCT: 47.5 % — ABNORMAL HIGH (ref 34.8–46.6)
LYMPH%: 10.2 % — ABNORMAL LOW (ref 14.0–49.7)
MCH: 27.7 pg (ref 25.1–34.0)
MCHC: 32.8 g/dL (ref 31.5–36.0)
MCV: 84.4 fL (ref 79.5–101.0)
MONO#: 0.7 10*3/uL (ref 0.1–0.9)
MONO%: 7.5 % (ref 0.0–14.0)
NEUT%: 77.9 % — ABNORMAL HIGH (ref 38.4–76.8)
Platelets: 588 10*3/uL — ABNORMAL HIGH (ref 145–400)
WBC: 9.6 10*3/uL (ref 3.9–10.3)

## 2011-04-15 LAB — COMPREHENSIVE METABOLIC PANEL
ALT: 17 U/L (ref 0–35)
Alkaline Phosphatase: 72 U/L (ref 39–117)
CO2: 25 mEq/L (ref 19–32)
Creatinine, Ser: 1.19 mg/dL — ABNORMAL HIGH (ref 0.50–1.10)
Glucose, Bld: 88 mg/dL (ref 70–99)
Total Bilirubin: 0.4 mg/dL (ref 0.3–1.2)

## 2011-04-15 NOTE — Progress Notes (Signed)
Pt phlebotomized 500g from rt AC.  Tolerated well.  Nourishment provided. dph

## 2011-04-15 NOTE — Patient Instructions (Signed)
Pt ambulatory, vs wnl.  dmb

## 2011-05-05 ENCOUNTER — Other Ambulatory Visit: Payer: Self-pay | Admitting: *Deleted

## 2011-05-05 MED ORDER — HYDROCHLOROTHIAZIDE 12.5 MG PO TABS: 12.5000 mg | ORAL_TABLET | Freq: Every day | ORAL | Status: DC

## 2011-05-05 MED ORDER — AMLODIPINE BESYLATE 2.5 MG PO TABS: 2.5000 mg | ORAL_TABLET | Freq: Every day | ORAL | Status: DC

## 2011-05-05 NOTE — Telephone Encounter (Signed)
Fax Received. Refill Completed. Shakari Qazi Chowoe (R.M.A)   

## 2011-05-10 DIAGNOSIS — E7800 Pure hypercholesterolemia, unspecified: Secondary | ICD-10-CM | POA: Diagnosis not present

## 2011-05-10 DIAGNOSIS — I10 Essential (primary) hypertension: Secondary | ICD-10-CM | POA: Diagnosis not present

## 2011-05-10 DIAGNOSIS — Z0000 Encounter for general adult medical examination without abnormal findings: Secondary | ICD-10-CM | POA: Diagnosis not present

## 2011-05-10 DIAGNOSIS — R5383 Other fatigue: Secondary | ICD-10-CM | POA: Diagnosis not present

## 2011-05-10 DIAGNOSIS — R5381 Other malaise: Secondary | ICD-10-CM | POA: Diagnosis not present

## 2011-05-13 ENCOUNTER — Other Ambulatory Visit: Payer: Self-pay | Admitting: *Deleted

## 2011-05-13 ENCOUNTER — Other Ambulatory Visit: Payer: Medicare Other | Admitting: Lab

## 2011-05-13 DIAGNOSIS — I10 Essential (primary) hypertension: Secondary | ICD-10-CM | POA: Diagnosis not present

## 2011-05-13 DIAGNOSIS — J309 Allergic rhinitis, unspecified: Secondary | ICD-10-CM | POA: Diagnosis not present

## 2011-05-17 ENCOUNTER — Ambulatory Visit: Payer: Medicare Other

## 2011-05-17 ENCOUNTER — Other Ambulatory Visit: Payer: Medicare Other | Admitting: Lab

## 2011-05-17 ENCOUNTER — Other Ambulatory Visit (HOSPITAL_BASED_OUTPATIENT_CLINIC_OR_DEPARTMENT_OTHER): Payer: Medicare Other | Admitting: Lab

## 2011-05-17 DIAGNOSIS — D0590 Unspecified type of carcinoma in situ of unspecified breast: Secondary | ICD-10-CM | POA: Diagnosis not present

## 2011-05-17 DIAGNOSIS — D751 Secondary polycythemia: Secondary | ICD-10-CM | POA: Diagnosis not present

## 2011-05-17 LAB — COMPREHENSIVE METABOLIC PANEL
BUN: 18 mg/dL (ref 6–23)
CO2: 27 mEq/L (ref 19–32)
Calcium: 9.7 mg/dL (ref 8.4–10.5)
Chloride: 99 mEq/L (ref 96–112)
Creatinine, Ser: 1.26 mg/dL — ABNORMAL HIGH (ref 0.50–1.10)
Glucose, Bld: 100 mg/dL — ABNORMAL HIGH (ref 70–99)
Total Bilirubin: 0.4 mg/dL (ref 0.3–1.2)

## 2011-05-17 LAB — CBC WITH DIFFERENTIAL/PLATELET
BASO%: 0.3 % (ref 0.0–2.0)
Basophils Absolute: 0 10*3/uL (ref 0.0–0.1)
EOS%: 4.5 % (ref 0.0–7.0)
HCT: 44.2 % (ref 34.8–46.6)
HGB: 14.6 g/dL (ref 11.6–15.9)
MCH: 27.5 pg (ref 25.1–34.0)
MONO#: 0.6 10*3/uL (ref 0.1–0.9)
NEUT%: 77.2 % — ABNORMAL HIGH (ref 38.4–76.8)
RDW: 14.8 % — ABNORMAL HIGH (ref 11.2–14.5)
WBC: 7.2 10*3/uL (ref 3.9–10.3)
lymph#: 0.7 10*3/uL — ABNORMAL LOW (ref 0.9–3.3)

## 2011-05-17 LAB — MORPHOLOGY

## 2011-05-17 LAB — LACTATE DEHYDROGENASE: LDH: 149 U/L (ref 94–250)

## 2011-05-17 LAB — CHCC SMEAR

## 2011-06-08 ENCOUNTER — Telehealth: Payer: Self-pay | Admitting: *Deleted

## 2011-06-08 NOTE — Telephone Encounter (Signed)
patient called in and confirmed appointment for 06-10-2011 starting at 12:00pm with lab and phelpm

## 2011-06-10 ENCOUNTER — Ambulatory Visit (HOSPITAL_BASED_OUTPATIENT_CLINIC_OR_DEPARTMENT_OTHER): Payer: Medicare Other

## 2011-06-10 ENCOUNTER — Other Ambulatory Visit: Payer: Medicare Other | Admitting: Lab

## 2011-06-10 ENCOUNTER — Ambulatory Visit: Payer: Medicare Other | Admitting: Oncology

## 2011-06-10 ENCOUNTER — Other Ambulatory Visit: Payer: Self-pay

## 2011-06-10 ENCOUNTER — Other Ambulatory Visit (HOSPITAL_BASED_OUTPATIENT_CLINIC_OR_DEPARTMENT_OTHER): Payer: Medicare Other | Admitting: Lab

## 2011-06-10 DIAGNOSIS — D751 Secondary polycythemia: Secondary | ICD-10-CM | POA: Diagnosis not present

## 2011-06-10 DIAGNOSIS — D649 Anemia, unspecified: Secondary | ICD-10-CM

## 2011-06-10 DIAGNOSIS — D0590 Unspecified type of carcinoma in situ of unspecified breast: Secondary | ICD-10-CM | POA: Diagnosis not present

## 2011-06-10 LAB — CBC WITH DIFFERENTIAL/PLATELET
Basophils Absolute: 0 10*3/uL (ref 0.0–0.1)
Eosinophils Absolute: 0.2 10*3/uL (ref 0.0–0.5)
HGB: 16.1 g/dL — ABNORMAL HIGH (ref 11.6–15.9)
LYMPH%: 8.1 % — ABNORMAL LOW (ref 14.0–49.7)
MCV: 82.9 fL (ref 79.5–101.0)
MONO#: 0.8 10*3/uL (ref 0.1–0.9)
MONO%: 9.3 % (ref 0.0–14.0)
NEUT#: 6.9 10*3/uL — ABNORMAL HIGH (ref 1.5–6.5)
Platelets: 400 10*3/uL (ref 145–400)
WBC: 8.6 10*3/uL (ref 3.9–10.3)
nRBC: 0 % (ref 0–0)

## 2011-06-10 LAB — FERRITIN: Ferritin: 28 ng/mL (ref 10–291)

## 2011-06-16 ENCOUNTER — Other Ambulatory Visit: Payer: Self-pay | Admitting: Physician Assistant

## 2011-06-16 DIAGNOSIS — D751 Secondary polycythemia: Secondary | ICD-10-CM

## 2011-06-17 ENCOUNTER — Ambulatory Visit (HOSPITAL_BASED_OUTPATIENT_CLINIC_OR_DEPARTMENT_OTHER): Payer: Medicare Other | Admitting: Physician Assistant

## 2011-06-17 ENCOUNTER — Other Ambulatory Visit (HOSPITAL_BASED_OUTPATIENT_CLINIC_OR_DEPARTMENT_OTHER): Payer: Medicare Other | Admitting: Lab

## 2011-06-17 ENCOUNTER — Telehealth: Payer: Self-pay | Admitting: Oncology

## 2011-06-17 VITALS — BP 150/76 | HR 73 | Temp 97.4°F | Ht 64.5 in | Wt 100.0 lb

## 2011-06-17 DIAGNOSIS — D751 Secondary polycythemia: Secondary | ICD-10-CM

## 2011-06-17 DIAGNOSIS — K2940 Chronic atrophic gastritis without bleeding: Secondary | ICD-10-CM

## 2011-06-17 LAB — CBC WITH DIFFERENTIAL/PLATELET
Basophils Absolute: 0 10*3/uL (ref 0.0–0.1)
EOS%: 2.5 % (ref 0.0–7.0)
Eosinophils Absolute: 0.2 10*3/uL (ref 0.0–0.5)
HCT: 43.9 % (ref 34.8–46.6)
HGB: 14.6 g/dL (ref 11.6–15.9)
MCH: 26.8 pg (ref 25.1–34.0)
NEUT#: 7.2 10*3/uL — ABNORMAL HIGH (ref 1.5–6.5)
NEUT%: 79.1 % — ABNORMAL HIGH (ref 38.4–76.8)
RDW: 15.3 % — ABNORMAL HIGH (ref 11.2–14.5)
lymph#: 0.9 10*3/uL (ref 0.9–3.3)

## 2011-06-17 LAB — COMPREHENSIVE METABOLIC PANEL
Albumin: 4.1 g/dL (ref 3.5–5.2)
BUN: 16 mg/dL (ref 6–23)
CO2: 29 mEq/L (ref 19–32)
Calcium: 9.8 mg/dL (ref 8.4–10.5)
Chloride: 94 mEq/L — ABNORMAL LOW (ref 96–112)
Creatinine, Ser: 1.2 mg/dL — ABNORMAL HIGH (ref 0.50–1.10)
Glucose, Bld: 113 mg/dL — ABNORMAL HIGH (ref 70–99)
Potassium: 4.5 mEq/L (ref 3.5–5.3)

## 2011-06-17 NOTE — Telephone Encounter (Signed)
gv pt appt schedule for June thru aug

## 2011-06-17 NOTE — Progress Notes (Signed)
Hematology and Oncology Follow Up Visit  [name] [id number] August 05, 1926 78 y.o. 06/17/2011    HPI: [name] is an 78 year old [place] woman with a history of JAK-2 positive polycythemia on monthly phlebotomies for hematocrit greater than or equal to 45% and anagrelide 0.5 mg by mouth twice a day.  Interim History:   [name] is seen today for routine three-month followup pertain to her history of polycythemia. She continues on anagrelide 0.5 mg by mouth twice a day, her last therapeutic phlebotomy was performed one week ago. Her only complaint, is that she notices that her fatigue level does seem to be increasing a bit following HDR. Phlebotomy. She denies any Frank shortness of breath or chest pain. No nausea or emesis issues. She continues to take iron replacement therapy. She denies any hematochezia or melena that her stools are dark or due to the iron replacement. Of note she has a history of chronic gastritis. She denies any aquagenic pruritus. She denies any easy bruisability. No fevers chills or night sweats. A detailed review of systems is otherwise noncontributory as noted below.  Review of Systems: Constitutional:  no weight loss, fever, night sweats Eyes: no complaints ENT: no complaints Cardiovascular: no chest pain or dyspnea on exertion Respiratory: no cough, shortness of breath, or wheezing Neurological: no TIA or stroke symptoms Dermatological: negative Gastrointestinal: no abdominal pain, change in bowel habits, or black or bloody stools Genito-Urinary: no dysuria, trouble voiding, or hematuria Hematological and Lymphatic: negative Breast: negative Musculoskeletal: negative Remaining ROS negative.   Medications:   I have reviewed the patient's current medications.  Current Outpatient Prescriptions  Medication Sig Dispense Refill  . amLODipine (NORVASC) 2.5 MG tablet Take 1 tablet (2.5 mg total) by mouth daily.  30 tablet  5  . anagrelide (AGRYLIN)  0.5 MG capsule Take 1 capsule (0.5 mg total) by mouth 2 (two) times daily.  60 capsule  3  . aspirin 81 MG tablet Take 81 mg by mouth daily.        . Cetirizine HCl (ZYRTEC PO) Take by mouth as needed.        . Cholecalciferol (VITAMIN D3) 1000 UNITS CAPS Take 2,600 mg by mouth 2 (two) times daily.       Marland Kitchen FeFum-FePo-FA-B Cmp-C-Zn-Mn-Cu (RE DUALVIT PLUS PO) Take 1 tablet by mouth daily.      Marland Kitchen GLUCOSAMINE PO Take 1,500 mg by mouth daily.      . hydrochlorothiazide (HYDRODIURIL) 12.5 MG tablet Take 1 tablet (12.5 mg total) by mouth daily.  30 tablet  5  . lisinopril (PRINIVIL,ZESTRIL) 20 MG tablet Take 1 tablet (20 mg total) by mouth daily.  30 tablet  6  . metoprolol succinate (TOPROL-XL) 50 MG 24 hr tablet Take 50 mg by mouth daily. Take with or immediately following a meal.      . fluticasone (FLONASE) 50 MCG/ACT nasal spray Place 2 sprays into the nose as needed.        . hydrochlorothiazide (HYDRODIURIL) 25 MG tablet Take 25 mg by mouth daily. Take 0.5 Tablet Daily      . Multiple Vitamin (MULTIVITAMIN) tablet Take 1 tablet by mouth daily.        . pantoprazole (PROTONIX) 40 MG tablet Take 40 mg by mouth as needed.        . potassium chloride (K-DUR) 10 MEQ tablet Take 1 tablet (10 mEq total) by mouth daily.  30 tablet  6    Allergies:  Allergies  Allergen Reactions  .  Sulfur     Physical Exam: Filed Vitals:   06/17/11 1258  BP: 150/76  Pulse: 73  Temp: 97.4 F (36.3 C)    Body mass index is 16.90 kg/(m^2). Weight: 100 lbs. HEENT:  Sclerae anicteric, conjunctivae pink.  Oropharynx clear.  No mucositis or candidiasis.   Nodes:  No cervical, supraclavicular, or axillary lymphadenopathy palpated.  Lungs:  Clear to auscultation bilaterally.  No crackles, rhonchi, or wheezes.   Heart:  Regular rate and rhythm.   Abdomen:  Soft, nontender.  Positive bowel sounds.  No organomegaly or masses palpated.   Musculoskeletal:  No focal spinal tenderness to palpation.  Extremities:  Benign.   No peripheral edema or cyanosis.   Skin:  Benign.   Neuro:  Nonfocal, alert and oriented x 3.   Lab Results: Lab Results  Component Value Date   WBC 9.1 06/17/2011   HGB 14.6 06/17/2011   HCT 43.9 06/17/2011   MCV 80.7 06/17/2011   PLT 441* 06/17/2011   NEUTROABS 7.2* 06/17/2011     Chemistry      Component Value Date/Time   NA 135 05/17/2011 0956   K 4.7 05/17/2011 0956   CL 99 05/17/2011 0956   CO2 27 05/17/2011 0956   BUN 18 05/17/2011 0956   CREATININE 1.26* 05/17/2011 0956      Component Value Date/Time   CALCIUM 9.7 05/17/2011 0956   ALKPHOS 63 05/17/2011 0956   AST 18 05/17/2011 0956   ALT 15 05/17/2011 0956   BILITOT 0.4 05/17/2011 0956      Lab Results  Component Value Date   LABCA2 34 03/18/2011   Assessment:  [name] is an 78 year old [place] woman with a history of JAK-2 positive polycythemia on monthly phlebotomies for hematocrit greater than or equal to 45% and anagrelide 0.5 mg by mouth twice a day.  Last therapeutic phlebotomy performed one week ago.  Case to be reviewed with Dr. Donnie Coffin.   Plan:  [name] will continue on anagrelide 0.5 mg by mouth twice a day. Her neck CBC will be performed at a 3 week interval, again the phlebotomy to be performed for hematocrit greater than or equal to 45%. She will have CBCs and phlebotomies obtained monthly, followup with Dr. Donnie Coffin at three-month interval. She understands and agrees with this plan. This plan was reviewed with the patient, who voices understanding and agreement.  She knows to call with any changes or problems.    Adileny Delon T, PA-C 06/17/2011

## 2011-07-01 ENCOUNTER — Other Ambulatory Visit: Payer: Self-pay | Admitting: *Deleted

## 2011-07-01 DIAGNOSIS — D751 Secondary polycythemia: Secondary | ICD-10-CM

## 2011-07-01 MED ORDER — TANDEM PLUS 162-115.2-1 MG PO CAPS: 1.0000 | ORAL_CAPSULE | Freq: Every day | ORAL | Status: DC

## 2011-07-08 ENCOUNTER — Ambulatory Visit (HOSPITAL_BASED_OUTPATIENT_CLINIC_OR_DEPARTMENT_OTHER): Payer: Medicare Other

## 2011-07-08 ENCOUNTER — Other Ambulatory Visit: Payer: Self-pay | Admitting: Physician Assistant

## 2011-07-08 ENCOUNTER — Other Ambulatory Visit (HOSPITAL_BASED_OUTPATIENT_CLINIC_OR_DEPARTMENT_OTHER): Payer: Medicare Other | Admitting: Lab

## 2011-07-08 DIAGNOSIS — D751 Secondary polycythemia: Secondary | ICD-10-CM | POA: Diagnosis not present

## 2011-07-08 DIAGNOSIS — K2940 Chronic atrophic gastritis without bleeding: Secondary | ICD-10-CM

## 2011-07-08 LAB — CBC WITH DIFFERENTIAL/PLATELET
Basophils Absolute: 0 10*3/uL (ref 0.0–0.1)
EOS%: 3.2 % (ref 0.0–7.0)
Eosinophils Absolute: 0.3 10*3/uL (ref 0.0–0.5)
HCT: 47.3 % — ABNORMAL HIGH (ref 34.8–46.6)
HGB: 15.7 g/dL (ref 11.6–15.9)
LYMPH%: 10.4 % — ABNORMAL LOW (ref 14.0–49.7)
MCH: 26.9 pg (ref 25.1–34.0)
MCV: 81.1 fL (ref 79.5–101.0)
MONO%: 10.1 % (ref 0.0–14.0)
NEUT#: 6.2 10*3/uL (ref 1.5–6.5)
NEUT%: 75.9 % (ref 38.4–76.8)
Platelets: 392 10*3/uL (ref 145–400)

## 2011-07-08 NOTE — Patient Instructions (Signed)

## 2011-07-08 NOTE — Progress Notes (Signed)
[id number] Phlebotomy completed on right [facility] with 527g extracted without difficulty.  Pt tolerated well and eating snacks/drink provided. 0945 Pt discharge without any c/o of dizziness, vitals stable.

## 2011-08-03 ENCOUNTER — Other Ambulatory Visit: Payer: Self-pay | Admitting: *Deleted

## 2011-08-03 DIAGNOSIS — D751 Secondary polycythemia: Secondary | ICD-10-CM

## 2011-08-03 MED ORDER — ANAGRELIDE HCL 0.5 MG PO CAPS: 0.5000 mg | ORAL_CAPSULE | Freq: Two times a day (BID) | ORAL | Status: DC

## 2011-08-06 ENCOUNTER — Ambulatory Visit (HOSPITAL_BASED_OUTPATIENT_CLINIC_OR_DEPARTMENT_OTHER): Payer: Medicare Other

## 2011-08-06 ENCOUNTER — Other Ambulatory Visit (HOSPITAL_BASED_OUTPATIENT_CLINIC_OR_DEPARTMENT_OTHER): Payer: Medicare Other | Admitting: Lab

## 2011-08-06 DIAGNOSIS — D751 Secondary polycythemia: Secondary | ICD-10-CM | POA: Diagnosis not present

## 2011-08-06 DIAGNOSIS — K2940 Chronic atrophic gastritis without bleeding: Secondary | ICD-10-CM

## 2011-08-06 LAB — CBC WITH DIFFERENTIAL/PLATELET
BASO%: 0.2 % (ref 0.0–2.0)
HCT: 45.3 % (ref 34.8–46.6)
LYMPH%: 9.1 % — ABNORMAL LOW (ref 14.0–49.7)
MCH: 27.1 pg (ref 25.1–34.0)
MCHC: 32.2 g/dL (ref 31.5–36.0)
MCV: 83.9 fL (ref 79.5–101.0)
MONO#: 0.6 10*3/uL (ref 0.1–0.9)
MONO%: 8.8 % (ref 0.0–14.0)
NEUT%: 78.1 % — ABNORMAL HIGH (ref 38.4–76.8)
Platelets: 460 10*3/uL — ABNORMAL HIGH (ref 145–400)
RBC: 5.4 10*6/uL (ref 3.70–5.45)
WBC: 7.3 10*3/uL (ref 3.9–10.3)

## 2011-08-06 NOTE — Patient Instructions (Signed)
Therapeutic Phlebotomy Therapeutic phlebotomy is the controlled removal of blood from your body for the purpose of treating a medical condition. It is similar to donating blood. Usually, about a pint (470 mL) of blood is removed. The average adult has 9 to 12 pints (4.3 to 5.7 L) of blood. Therapeutic phlebotomy may be used to treat the following medical conditions:  Hemochromatosis. This is a condition in which there is too much iron in the blood.   Polycythemia vera. This is a condition in which there are too many red cells in the blood.   Porphyria cutanea tarda. This is a disease usually passed from one generation to the next (inherited). It is a condition in which an important part of hemoglobin is not made properly. This results in the build up of abnormal amounts of porphyrins in the body.   Sickle cell disease. This is an inherited disease. It is a condition in which the red blood cells form an abnormal crescent shape rather than a round shape.  LET YOUR CAREGIVER KNOW ABOUT:  Allergies.   Medicines taken including herbs, eyedrops, over-the-counter medicines, and creams.   Use of steroids (by mouth or creams).   Previous problems with anesthetics or numbing medicine.   History of blood clots.   History of bleeding or blood problems.   Previous surgery.   Possibility of pregnancy, if this applies.  RISKS AND COMPLICATIONS This is a simple and safe procedure. Problems are unlikely. However, problems can occur and may include:  Nausea or lightheadedness.   Low blood pressure.   Soreness, bleeding, swelling, or bruising at the needle insertion site.   Infection.  BEFORE THE PROCEDURE  This is a procedure that can be done as an outpatient. Confirm the time that you need to arrive for your procedure. Confirm whether there is a need to fast or withhold any medications. It is helpful to wear clothing with sleeves that can be raised above the elbow. A blood sample may be done  to determine the amount of red blood cells or iron in your blood. Plan ahead of time to have someone drive you home after the procedure. PROCEDURE The entire procedure from preparation through recovery takes about 1 hour. The actual collection takes about 10 to 15 minutes.  A needle will be inserted into your vein.   Tubing and a collection bag will be attached to that needle.   Blood will flow through the needle and tubing into the collection bag.   You may be asked to open and close your hand slowly and continuously during the entire collection.   Once the specified amount of blood has been removed from your body, the collection bag and tubing will be clamped.   The needle will be removed.   Pressure will be held on the site of the needle insertion to stop the bleeding. Then a bandage will be placed over the needle insertion site.  AFTER THE PROCEDURE  Your recovery will be assessed and monitored. If there are no problems, as an outpatient, you should be able to go home shortly after the procedure.  Document Released: 06/22/2010 Document Revised: 01/07/2011 Document Reviewed: 06/22/2010 [employer] Patient Information 2012 [employer]. 

## 2011-08-12 ENCOUNTER — Encounter: Payer: Self-pay | Admitting: *Deleted

## 2011-08-31 ENCOUNTER — Encounter: Payer: Self-pay | Admitting: Internal Medicine

## 2011-08-31 ENCOUNTER — Ambulatory Visit (INDEPENDENT_AMBULATORY_CARE_PROVIDER_SITE_OTHER): Payer: Medicare Other | Admitting: Internal Medicine

## 2011-08-31 ENCOUNTER — Telehealth: Payer: Self-pay | Admitting: *Deleted

## 2011-08-31 ENCOUNTER — Encounter: Payer: Self-pay | Admitting: *Deleted

## 2011-08-31 VITALS — BP 132/62 | HR 64 | Ht 64.0 in | Wt 100.1 lb

## 2011-08-31 DIAGNOSIS — K219 Gastro-esophageal reflux disease without esophagitis: Secondary | ICD-10-CM | POA: Diagnosis not present

## 2011-08-31 DIAGNOSIS — Z8719 Personal history of other diseases of the digestive system: Secondary | ICD-10-CM | POA: Diagnosis not present

## 2011-08-31 DIAGNOSIS — R1310 Dysphagia, unspecified: Secondary | ICD-10-CM | POA: Diagnosis not present

## 2011-08-31 MED ORDER — DICYCLOMINE HCL 10 MG PO CAPS: 10.0000 mg | ORAL_CAPSULE | Freq: Two times a day (BID) | ORAL | Status: DC | PRN

## 2011-08-31 NOTE — Progress Notes (Signed)
[name] [birth year] MRN [id number]   History of Present Illness:  This is an 78 year old white female with solid food dysphagia, heartburn and history of hiatal hernia with mild esophageal stricture on her last upper endoscopy in May 2009. She has a diagnosis of chronic gastritis with focal intestinal metaplasia on an endoscopy in February 2007. Her mother had gastric cancer. She is up-to-date on her colonoscopy. Her last exam in February 2007 showed moderately severe diverticulosis and hemorrhoids. She has a positive family history of colon polyps in 2 sisters. She denies any rectal bleeding but has been having periodic diarrhea and frequent urgent stools. She has been taking Metamucil daily. She has polycythemia treated by Dr. Donnie Coffin with monthly phlebotomies.   Past Medical History  Diagnosis Date  . MVP (mitral valve prolapse)   . Hypertension   . Chest tightness   . Palpitation   . PVC's (premature ventricular contractions)   . Breast cancer     right  . Polycythemia   . Chronic gastritis   . Diverticulosis   . Candida esophagitis   . Arthritis    Past Surgical History  Procedure Date  . Tonsillectomy   . Appendectomy   . Cardiovascular stress test 12/23/2000    EF 70%  . Transthoracic echocardiogram 03/05/2010    EF 55-60%  . Abdominal hysterectomy     ovaries taken  . Breast lumpectomy     right  . Elbow fracture surgery     left  . Cataract extraction     bilateral  . Hematoma evacuation     right    reports that she quit smoking about 31 years ago. She has never used smokeless tobacco. She reports that she drinks alcohol. She reports that she does not use illicit drugs. family history includes Diabetes in her brother; Hypertension in her brother, father, and sister; Prostate cancer in her brother and father; Stomach cancer in her mother; and Stroke in her father.  There is no history of Colon cancer. Allergies  Allergen Reactions  . Sulfur          Review of Systems: Positive for dysphagia. Denies odynophagia. Stable weight  The remainder of the 10 point ROS is negative except as outlined in H&P   Physical Exam: General appearance  Well developed, in no distress. Eyes- non icteric. HEENT nontraumatic, normocephalic. Mouth no lesions, tongue papillated, no cheilosis. Neck supple without adenopathy, thyroid not enlarged, no carotid bruits, no JVD. Lungs Clear to auscultation bilaterally. Cor normal S1, normal S2, regular rhythm, no murmur,  quiet precordium. Abdomen: Soft nontender abdomen with normoactive bowel sounds. No distention. No bruit. Liver edge at costal margin. Rectal: Dark Hemoccult negative stool. Patient has been on iron supplements. Extremities no pedal edema. Skin no lesions. Neurological alert and oriented x 3. Psychological normal mood and affect.  Assessment and Plan:  Problem #1 Dysphagia and reflux symptoms with history of intestinal metaplasia and esophageal stricture. We will proceed with an upper endoscopy and biopsies. She has a positive family history of gastric cancer in her mother.  Problem #2 History of symptomatic diverticulosis and urgent bowel movements consistent with severe diverticulosis. She will continue on Metamucil and will add Bentyl 10 mg twice a day. She is up-to-date on her colonoscopy. She will be 78 years old when she is due for a recall colonoscopy so we discussed no definite recall.     08/31/2011 [name]

## 2011-08-31 NOTE — Patient Instructions (Addendum)
You have been scheduled for an endoscopy with propofol. Please follow written instructions given to you at your visit today. If you use inhalers (even only as needed), please bring them with you on the day of your procedure. We have sent the following medications to your pharmacy for you to pick up at your convenience: Bentyl CC: Dr Ricki Miller, Dr Pierce Crane

## 2011-08-31 NOTE — Progress Notes (Signed)
Per TK, left VM for pt to notify her that appt for 09/03/11 will be rescheduled for approximately 3 weeks. Have also notified scheduling

## 2011-08-31 NOTE — Telephone Encounter (Signed)
Per orders from 08-31-2011 moved patient appointments to 09-22-2011 starting at 2:30pm sent michelle email to set up patient's treatment

## 2011-09-01 ENCOUNTER — Telehealth: Payer: Self-pay | Admitting: *Deleted

## 2011-09-01 NOTE — Telephone Encounter (Signed)
Per staff message and POF I have scheduled appt.  JMW  

## 2011-09-02 ENCOUNTER — Other Ambulatory Visit: Payer: Self-pay | Admitting: Emergency Medicine

## 2011-09-02 ENCOUNTER — Telehealth: Payer: Self-pay | Admitting: Oncology

## 2011-09-02 ENCOUNTER — Other Ambulatory Visit: Payer: Self-pay | Admitting: Oncology

## 2011-09-02 DIAGNOSIS — D751 Secondary polycythemia: Secondary | ICD-10-CM

## 2011-09-02 NOTE — Telephone Encounter (Signed)
Due to PR out 8/2 f/u moved to 8/21 w/next phleb. Pt to keep lb/phleb tomorrow. S/w pt today she is aware of changes and has new d/t's for 8/2 and 8/21.

## 2011-09-03 ENCOUNTER — Ambulatory Visit: Payer: Medicare Other | Admitting: Oncology

## 2011-09-03 ENCOUNTER — Other Ambulatory Visit (HOSPITAL_BASED_OUTPATIENT_CLINIC_OR_DEPARTMENT_OTHER): Payer: Medicare Other | Admitting: Lab

## 2011-09-03 ENCOUNTER — Ambulatory Visit: Payer: Medicare Other

## 2011-09-03 ENCOUNTER — Other Ambulatory Visit: Payer: Medicare Other

## 2011-09-03 DIAGNOSIS — D751 Secondary polycythemia: Secondary | ICD-10-CM | POA: Diagnosis not present

## 2011-09-03 DIAGNOSIS — K2940 Chronic atrophic gastritis without bleeding: Secondary | ICD-10-CM

## 2011-09-03 LAB — CBC WITH DIFFERENTIAL/PLATELET
Eosinophils Absolute: 0.3 10*3/uL (ref 0.0–0.5)
LYMPH%: 10.7 % — ABNORMAL LOW (ref 14.0–49.7)
MCV: 83.7 fL (ref 79.5–101.0)
MONO%: 9.2 % (ref 0.0–14.0)
NEUT#: 5.7 10*3/uL (ref 1.5–6.5)
Platelets: 484 10*3/uL — ABNORMAL HIGH (ref 145–400)
RBC: 5.31 10*6/uL (ref 3.70–5.45)

## 2011-09-03 LAB — COMPREHENSIVE METABOLIC PANEL
Alkaline Phosphatase: 65 U/L (ref 39–117)
BUN: 15 mg/dL (ref 6–23)
Creatinine, Ser: 1.11 mg/dL — ABNORMAL HIGH (ref 0.50–1.10)
Glucose, Bld: 100 mg/dL — ABNORMAL HIGH (ref 70–99)
Sodium: 135 mEq/L (ref 135–145)
Total Bilirubin: 0.3 mg/dL (ref 0.3–1.2)
Total Protein: 6.1 g/dL (ref 6.0–8.3)

## 2011-09-03 NOTE — Progress Notes (Signed)
1210--Patient in for phlebotomy today, however, HCT=44.4, less than parameters set by MD for phlebotomy if >45%, so held for today. Patient understands to return on 8/21 for lab/MD/phlebotomy if needed. Copy of labs given to patient.

## 2011-09-13 ENCOUNTER — Other Ambulatory Visit: Payer: Self-pay | Admitting: *Deleted

## 2011-09-13 DIAGNOSIS — H04129 Dry eye syndrome of unspecified lacrimal gland: Secondary | ICD-10-CM | POA: Diagnosis not present

## 2011-09-13 DIAGNOSIS — Z961 Presence of intraocular lens: Secondary | ICD-10-CM | POA: Diagnosis not present

## 2011-09-13 DIAGNOSIS — H40019 Open angle with borderline findings, low risk, unspecified eye: Secondary | ICD-10-CM | POA: Diagnosis not present

## 2011-09-13 DIAGNOSIS — H2640 Unspecified secondary cataract: Secondary | ICD-10-CM | POA: Diagnosis not present

## 2011-09-13 MED ORDER — POTASSIUM CHLORIDE ER 10 MEQ PO TBCR: 10.0000 meq | EXTENDED_RELEASE_TABLET | Freq: Every day | ORAL | Status: DC

## 2011-09-13 NOTE — Telephone Encounter (Signed)
Fax Received. Refill Completed. Bevan Disney Chowoe (R.M.A)   

## 2011-09-20 ENCOUNTER — Other Ambulatory Visit: Payer: Self-pay | Admitting: Cardiovascular Disease

## 2011-09-20 MED ORDER — LISINOPRIL 20 MG PO TABS: 20.0000 mg | ORAL_TABLET | Freq: Every day | ORAL | Status: DC

## 2011-09-22 ENCOUNTER — Telehealth: Payer: Self-pay | Admitting: Oncology

## 2011-09-22 ENCOUNTER — Other Ambulatory Visit (HOSPITAL_BASED_OUTPATIENT_CLINIC_OR_DEPARTMENT_OTHER): Payer: Medicare Other | Admitting: Lab

## 2011-09-22 ENCOUNTER — Other Ambulatory Visit: Payer: Self-pay | Admitting: Oncology

## 2011-09-22 ENCOUNTER — Ambulatory Visit (HOSPITAL_BASED_OUTPATIENT_CLINIC_OR_DEPARTMENT_OTHER): Payer: Medicare Other | Admitting: Family

## 2011-09-22 ENCOUNTER — Telehealth: Payer: Self-pay | Admitting: *Deleted

## 2011-09-22 ENCOUNTER — Ambulatory Visit (HOSPITAL_BASED_OUTPATIENT_CLINIC_OR_DEPARTMENT_OTHER): Payer: Medicare Other

## 2011-09-22 ENCOUNTER — Encounter: Payer: Self-pay | Admitting: Family

## 2011-09-22 VITALS — BP 163/65 | HR 66 | Temp 98.2°F | Resp 20 | Ht 64.0 in | Wt 102.2 lb

## 2011-09-22 DIAGNOSIS — I959 Hypotension, unspecified: Secondary | ICD-10-CM

## 2011-09-22 DIAGNOSIS — D751 Secondary polycythemia: Secondary | ICD-10-CM

## 2011-09-22 DIAGNOSIS — D45 Polycythemia vera: Secondary | ICD-10-CM

## 2011-09-22 LAB — CBC WITH DIFFERENTIAL/PLATELET
Basophils Absolute: 0 10*3/uL (ref 0.0–0.1)
Eosinophils Absolute: 0.3 10*3/uL (ref 0.0–0.5)
HCT: 47.1 % — ABNORMAL HIGH (ref 34.8–46.6)
HGB: 15.2 g/dL (ref 11.6–15.9)
LYMPH%: 8.7 % — ABNORMAL LOW (ref 14.0–49.7)
MONO#: 1 10*3/uL — ABNORMAL HIGH (ref 0.1–0.9)
NEUT#: 6.2 10*3/uL (ref 1.5–6.5)
NEUT%: 75.1 % (ref 38.4–76.8)
Platelets: 340 10*3/uL (ref 145–400)
WBC: 8.2 10*3/uL (ref 3.9–10.3)

## 2011-09-22 MED ORDER — SODIUM CHLORIDE 0.9 % IV SOLN
Freq: Once | INTRAVENOUS | Status: AC
  Administered 2011-09-22: 17:00:00 via INTRAVENOUS

## 2011-09-22 NOTE — Progress Notes (Signed)
Hematology and Oncology Follow Up Visit  [name] [id number] [birth year] 78 y.o. 09/22/2011    HPI: 1. [name] is an 78 year old [place] woman with a history of JAK-2 positive polycythemia on monthly phlebotomies for hematocrit greater than or equal to 45% and anagrelide 0.5 mg by mouth twice a day. 2. History right breast cancer, 2007. Had right lumpectomy followed by radiation therapy.   Interim History:   Seen today for routine three-month followup for history of polycythemia. She continues on anagrelide 0.5 mg by mouth twice a day. Blood counts have been stable for a couple of months, not requiring phlebotomy. She tells me she is feeling "sluggish" which is usually an indication she needs phlebotomy. Also reports her blood pressure drops when she is phlebotomized and she feels badly for 2-3 days. Denies shortness of breath or chest pain. No nausea or emesis, continues to take iron replacement therapy. No hematochezia or melena, notices her stools are dark due to the iron replacement. Has a history of chronic gastritis. No easy bruisability. No fevers chills or night sweats.  Last mammogram was in November 2012, no evidence of malignancy.   A detailed review of systems is otherwise noncontributory.  Medications:   I have reviewed the patient's current medications.  Allergies:  Allergies  Allergen Reactions  . Sulfur    Physical Exam: Filed Vitals:   09/22/11 1434  BP: 163/65  Pulse: 66  Temp: 98.2 F (36.8 C)  Resp: 20    Body mass index is 17.54 kg/(m^2). Weight: 100 lbs. HEENT:  Sclerae anicteric, conjunctivae pink.   Lungs: Clear to auscultation bilaterally.  Heart:  Regular rate and rhythm.   Abdomen:  Soft, nontender.  Positive bowel sounds.  No organomegaly or masses palpated.   Musculoskeletal:  No focal spinal tenderness to palpation.  Extremities:  Benign.  No peripheral edema or cyanosis.   Skin:  Benign.   Neuro:  Nonfocal, alert and  oriented x 3.   Lab Results: Lab Results  Component Value Date   WBC 8.2 09/22/2011   HGB 15.2 09/22/2011   HCT 47.1* 09/22/2011   MCV 81.5 09/22/2011   PLT 340 09/22/2011   NEUTROABS 6.2 09/22/2011     Chemistry      Component Value Date/Time   NA 135 09/03/2011 1154   K 4.8 09/03/2011 1154   CL 100 09/03/2011 1154   CO2 24 09/03/2011 1154   BUN 15 09/03/2011 1154   CREATININE 1.11* 09/03/2011 1154      Component Value Date/Time   CALCIUM 9.5 09/03/2011 1154   ALKPHOS 65 09/03/2011 1154   AST 18 09/03/2011 1154   ALT 15 09/03/2011 1154   BILITOT 0.3 09/03/2011 1154      Assessment: 78 year old [place] woman with: 1. History of JAK-2 positive polycythemia on monthly phlebotomies for hematocrit greater than or equal to 45% and anagrelide 0.5 mg by mouth twice a day. Case to be reviewed with Dr. Donnie Coffin. 2. Symptomatic hypotension following phlebotomy  Plan:  1. [name] will continue on anagrelide 0.5 mg by mouth twice a day. Her neck CBC will be performed at a 3 week interval, again the phlebotomy to be performed for hematocrit greater than or equal to 45%. She will have CBCs and phlebotomies obtained monthly, followup with Dr. Donnie Coffin at three-month interval.  Will receive phlebotomy today. 2. Mammogram due November 2013.   3. I will order IV fluids to be given after phlebotomy to minimize hypotension.   This  plan was reviewed with the patient, who voices understanding and agreement.  She knows to call with any changes or problems.    Colman Cater, FNP-C 09/22/2011

## 2011-09-22 NOTE — Patient Instructions (Signed)
Patient aware of next appointment; discharged home with no complaints. 

## 2011-09-22 NOTE — Telephone Encounter (Signed)
Per staff message and POF I have scheduled appts.  JMW  

## 2011-09-22 NOTE — Progress Notes (Signed)
Patient received phlebotomy today; removed 500cc of blood with no problems; pre and post vital signs stable; patient also received 1 liter of Normal Saline; no signs of symptoms of nausea or dizziness noted post infusion.

## 2011-09-22 NOTE — Patient Instructions (Signed)
Phlebotomy today with fluids. Monthly labs. See Dr. Donnie Coffin in 3 months.

## 2011-09-22 NOTE — Telephone Encounter (Signed)
The pt is aware that her phlebotomy appts will be added. Sent michelle a staff message

## 2011-09-30 DIAGNOSIS — H26499 Other secondary cataract, unspecified eye: Secondary | ICD-10-CM | POA: Diagnosis not present

## 2011-09-30 DIAGNOSIS — H2640 Unspecified secondary cataract: Secondary | ICD-10-CM | POA: Diagnosis not present

## 2011-10-07 ENCOUNTER — Other Ambulatory Visit: Payer: Self-pay | Admitting: Certified Registered Nurse Anesthetist

## 2011-10-07 DIAGNOSIS — H26499 Other secondary cataract, unspecified eye: Secondary | ICD-10-CM | POA: Diagnosis not present

## 2011-10-07 DIAGNOSIS — D751 Secondary polycythemia: Secondary | ICD-10-CM

## 2011-10-07 DIAGNOSIS — H2640 Unspecified secondary cataract: Secondary | ICD-10-CM | POA: Diagnosis not present

## 2011-10-07 MED ORDER — ANAGRELIDE HCL 0.5 MG PO CAPS: 0.5000 mg | ORAL_CAPSULE | Freq: Two times a day (BID) | ORAL | Status: DC

## 2011-10-15 ENCOUNTER — Encounter: Payer: Self-pay | Admitting: Internal Medicine

## 2011-10-15 ENCOUNTER — Ambulatory Visit (AMBULATORY_SURGERY_CENTER): Payer: Medicare Other | Admitting: Internal Medicine

## 2011-10-15 VITALS — BP 174/78 | HR 61 | Temp 96.7°F | Resp 20 | Ht 64.0 in | Wt 100.0 lb

## 2011-10-15 DIAGNOSIS — K222 Esophageal obstruction: Secondary | ICD-10-CM

## 2011-10-15 DIAGNOSIS — K2940 Chronic atrophic gastritis without bleeding: Secondary | ICD-10-CM

## 2011-10-15 DIAGNOSIS — R1310 Dysphagia, unspecified: Secondary | ICD-10-CM

## 2011-10-15 DIAGNOSIS — K2961 Other gastritis with bleeding: Secondary | ICD-10-CM

## 2011-10-15 DIAGNOSIS — I10 Essential (primary) hypertension: Secondary | ICD-10-CM | POA: Diagnosis not present

## 2011-10-15 DIAGNOSIS — D131 Benign neoplasm of stomach: Secondary | ICD-10-CM | POA: Diagnosis not present

## 2011-10-15 MED ORDER — SODIUM CHLORIDE 0.9 % IV SOLN: 500.0000 mL | INTRAVENOUS | Status: DC

## 2011-10-15 MED ORDER — PANTOPRAZOLE SODIUM 40 MG PO TBEC: 40.0000 mg | DELAYED_RELEASE_TABLET | Freq: Every day | ORAL | Status: DC

## 2011-10-15 NOTE — Patient Instructions (Addendum)
YOU HAD AN ENDOSCOPIC PROCEDURE TODAY AT THE [facility]: Refer to the procedure report that was given to you for any specific questions about what was found during the examination.  If the procedure report does not answer your questions, please call your gastroenterologist to clarify.  If you requested that your care partner not be given the details of your procedure findings, then the procedure report has been included in a sealed envelope for you to review at your convenience later.  YOU SHOULD EXPECT: Some feelings of bloating in the abdomen. Passage of more gas than usual.  Walking can help get rid of the air that was put into your GI tract during the procedure and reduce the bloating. If you had a lower endoscopy (such as a colonoscopy or flexible sigmoidoscopy) you may notice spotting of blood in your stool or on the toilet paper. If you underwent a bowel prep for your procedure, then you may not have a normal bowel movement for a few days.  DIET: FOLLOW DILATION DIET- SEE HANDOUT.  Drink plenty of fluids but you should avoid alcoholic beverages for 24 hours.  ACTIVITY: Your care partner should take you home directly after the procedure.  You should plan to take it easy, moving slowly for the rest of the day.  You can resume normal activity the day after the procedure however you should NOT DRIVE or use heavy machinery for 24 hours (because of the sedation medicines used during the test).    SYMPTOMS TO REPORT IMMEDIATELY: A gastroenterologist can be reached at any hour.  During normal business hours, 8:30 AM to 5:00 PM Monday through Friday, call [phone].  After hours and on weekends, please call the GI answering service at [phone] who will take a message and have the physician on call contact you.   Following upper endoscopy (EGD)  Vomiting of blood or coffee ground material  New chest pain or pain under the shoulder blades  Painful or persistently difficult  swallowing  New shortness of breath  Fever of 100F or higher  Black, tarry-looking stools  FOLLOW UP: If any biopsies were taken you will be contacted by phone or by letter within the next 1-3 weeks.  Call your gastroenterologist if you have not heard about the biopsies in 3 weeks.  Our staff will call the home number listed on your records the next business day following your procedure to check on you and address any questions or concerns that you may have at that time regarding the information given to you following your procedure. This is a courtesy call and so if there is no answer at the home number and we have not heard from you through the emergency physician on call, we will assume that you have returned to your regular daily activities without incident.  SIGNATURES/CONFIDENTIALITY: You and/or your care partner have signed paperwork which will be entered into your electronic medical record.  These signatures attest to the fact that that the information above on your After Visit Summary has been reviewed and is understood.  Full responsibility of the confidentiality of this discharge information lies with you and/or your care-partner.   Await biopsy instructions  Follow anti-reflux regimen- see handout

## 2011-10-15 NOTE — Progress Notes (Signed)
Patient did not experience any of the following events: a burn prior to discharge; a fall within the facility; wrong site/side/patient/procedure/implant event; or a hospital transfer or hospital admission upon discharge from the facility. (G8907) Patient did not have preoperative order for IV antibiotic SSI prophylaxis. (G8918)  

## 2011-10-15 NOTE — Op Note (Signed)
[facility] [address] [place], [address]   ENDOSCOPY PROCEDURE REPORT  PATIENT: [name], [name]  MR#: [id number] BIRTHDATE: [birth year] , 85  yrs. old GENDER: Female ENDOSCOPIST: Hart Carwin, MD REFERRED BY:  Juline Patch, M.D. PROCEDURE DATE:  10/15/2011 PROCEDURE:  EGD w/ biopsy and Savary dilation of esophagus ASA CLASS:     Class III INDICATIONS:  dysphagia.   heartburn.   hx es.  stricture, dilated 2009, takes Protonix prn. MEDICATIONS: [name] sedation, administered by CRNA and Propofol (Diprivan) 80 mg IV TOPICAL ANESTHETIC: Cetacaine Spray  DESCRIPTION OF PROCEDURE: After the risks benefits and alternatives of the procedure were thoroughly explained, informed consent was obtained.  The LB [id number]-H180 [id number] endoscope was introduced through the mouth and advanced to the second portion of the duodenum. Without limitations.  The instrument was slowly withdrawn as the mucosa was fully examined.      ESOPHAGUS: nonobbstructing fibrous ring, no active erosions Stricture was found at the gastroesophageal junction.   16 mm savary dilator passed over the guidewire without difficulty, no blood on the dilator, 3 cm hiatal hernia  STOMACH: Moderate acute gastritis (inflammation) was found in the gastric antrum and gastric body.  There were erosions present. specks of coffee ground material throughout the stomach. Biopsies taken Retroflexed views revealed a hiatal hernia.     The scope was then withdrawn from the patient and the procedure completed.  COMPLICATIONS: There were no complications. ENDOSCOPIC IMPRESSION: 1.   Stricture was found at the gastroesophageal junction , non obstructing benign stricture, s/p passage of 16 mm dilator over the guidewire 2.   Acute gastritis (inflammation) was found in the gastric antrum and gastric body , speckes of old blood, Bx/s taken  RECOMMENDATIONS: 1.  await biopsy results 2.  anti-reflux regimen to be  follow 3.  continue PPI 4.  start Protonix 40 mg on every day basis  REPEAT EXAM: for No recall  eSigned:  Hart Carwin, MD 10/15/2011 8:29 AM   CC:  PATIENT NAME:  [name], [name] MR#: [id number]

## 2011-10-18 ENCOUNTER — Telehealth: Payer: Self-pay

## 2011-10-18 ENCOUNTER — Telehealth: Payer: Self-pay | Admitting: Cardiovascular Disease

## 2011-10-18 NOTE — Telephone Encounter (Signed)
Pt needs refill metoprolol [place], requested twice, pt now, out needs asap today

## 2011-10-18 NOTE — Telephone Encounter (Signed)
  Follow up Call-  Call back number 10/15/2011  Post procedure Call Back phone  # [phone]  Permission to leave phone message Yes     Patient questions:  Do you have a fever, pain , or abdominal swelling? no Pain Score  0 *  Have you tolerated food without any problems? yes  Have you been able to return to your normal activities? yes  Do you have any questions about your discharge instructions: Diet   no Medications  no Follow up visit  no  Do you have questions or concerns about your Care? no  Actions: * If pain score is 4 or above: No action needed, pain <4.

## 2011-10-19 ENCOUNTER — Encounter: Payer: Self-pay | Admitting: Internal Medicine

## 2011-10-19 ENCOUNTER — Other Ambulatory Visit: Payer: Self-pay | Admitting: *Deleted

## 2011-10-19 DIAGNOSIS — D751 Secondary polycythemia: Secondary | ICD-10-CM

## 2011-10-19 MED ORDER — METOPROLOL SUCCINATE ER 50 MG PO TB24: 50.0000 mg | ORAL_TABLET | Freq: Every day | ORAL | Status: DC

## 2011-10-22 ENCOUNTER — Other Ambulatory Visit: Payer: Self-pay | Admitting: Oncology

## 2011-10-22 ENCOUNTER — Other Ambulatory Visit: Payer: Self-pay | Admitting: Emergency Medicine

## 2011-10-22 ENCOUNTER — Other Ambulatory Visit (HOSPITAL_BASED_OUTPATIENT_CLINIC_OR_DEPARTMENT_OTHER): Payer: Medicare Other | Admitting: Lab

## 2011-10-22 ENCOUNTER — Ambulatory Visit (HOSPITAL_BASED_OUTPATIENT_CLINIC_OR_DEPARTMENT_OTHER): Payer: Medicare Other

## 2011-10-22 DIAGNOSIS — D751 Secondary polycythemia: Secondary | ICD-10-CM | POA: Diagnosis not present

## 2011-10-22 DIAGNOSIS — D45 Polycythemia vera: Secondary | ICD-10-CM

## 2011-10-22 LAB — CBC WITH DIFFERENTIAL/PLATELET
BASO%: 0.3 % (ref 0.0–2.0)
Basophils Absolute: 0 10*3/uL (ref 0.0–0.1)
HCT: 49.4 % — ABNORMAL HIGH (ref 34.8–46.6)
HGB: 15.8 g/dL (ref 11.6–15.9)
LYMPH%: 9.1 % — ABNORMAL LOW (ref 14.0–49.7)
MCHC: 31.9 g/dL (ref 31.5–36.0)
MONO#: 0.7 10*3/uL (ref 0.1–0.9)
NEUT%: 77.7 % — ABNORMAL HIGH (ref 38.4–76.8)
Platelets: 479 10*3/uL — ABNORMAL HIGH (ref 145–400)
WBC: 8.3 10*3/uL (ref 3.9–10.3)

## 2011-10-22 LAB — COMPREHENSIVE METABOLIC PANEL (CC13)
ALT: 16 U/L (ref 0–55)
BUN: 14 mg/dL (ref 7.0–26.0)
CO2: 25 mEq/L (ref 22–29)
Calcium: 9.8 mg/dL (ref 8.4–10.4)
Chloride: 100 mEq/L (ref 98–107)
Creatinine: 1.2 mg/dL — ABNORMAL HIGH (ref 0.6–1.1)
Glucose: 108 mg/dl — ABNORMAL HIGH (ref 70–99)
Total Bilirubin: 0.5 mg/dL (ref 0.20–1.20)

## 2011-10-22 NOTE — Patient Instructions (Signed)
Therapeutic Phlebotomy Therapeutic phlebotomy is the controlled removal of blood from your body for the purpose of treating a medical condition. It is similar to donating blood. Usually, about a pint (470 mL) of blood is removed. The average adult has 9 to 12 pints (4.3 to 5.7 L) of blood. Therapeutic phlebotomy may be used to treat the following medical conditions:  Hemochromatosis. This is a condition in which there is too much iron in the blood.   Polycythemia vera. This is a condition in which there are too many red cells in the blood.   Porphyria cutanea tarda. This is a disease usually passed from one generation to the next (inherited). It is a condition in which an important part of hemoglobin is not made properly. This results in the build up of abnormal amounts of porphyrins in the body.   Sickle cell disease. This is an inherited disease. It is a condition in which the red blood cells form an abnormal crescent shape rather than a round shape.  LET YOUR CAREGIVER KNOW ABOUT:  Allergies.   Medicines taken including herbs, eyedrops, over-the-counter medicines, and creams.   Use of steroids (by mouth or creams).   Previous problems with anesthetics or numbing medicine.   History of blood clots.   History of bleeding or blood problems.   Previous surgery.   Possibility of pregnancy, if this applies.  RISKS AND COMPLICATIONS This is a simple and safe procedure. Problems are unlikely. However, problems can occur and may include:  Nausea or lightheadedness.   Low blood pressure.   Soreness, bleeding, swelling, or bruising at the needle insertion site.   Infection.  BEFORE THE PROCEDURE  This is a procedure that can be done as an outpatient. Confirm the time that you need to arrive for your procedure. Confirm whether there is a need to fast or withhold any medications. It is helpful to wear clothing with sleeves that can be raised above the elbow. A blood sample may be done  to determine the amount of red blood cells or iron in your blood. Plan ahead of time to have someone drive you home after the procedure. PROCEDURE The entire procedure from preparation through recovery takes about 1 hour. The actual collection takes about 10 to 15 minutes.  A needle will be inserted into your vein.   Tubing and a collection bag will be attached to that needle.   Blood will flow through the needle and tubing into the collection bag.   You may be asked to open and close your hand slowly and continuously during the entire collection.   Once the specified amount of blood has been removed from your body, the collection bag and tubing will be clamped.   The needle will be removed.   Pressure will be held on the site of the needle insertion to stop the bleeding. Then a bandage will be placed over the needle insertion site.  AFTER THE PROCEDURE  Your recovery will be assessed and monitored. If there are no problems, as an outpatient, you should be able to go home shortly after the procedure.  Document Released: 06/22/2010 Document Revised: 01/07/2011 Document Reviewed: 06/22/2010 [employer] Patient Information 2012 [employer]. 

## 2011-10-22 NOTE — Progress Notes (Signed)
1105 Phlebotomy completed to Right AC using phlebotomy kit. 500g removed. Patient tolerated treatment well. Drink provided. Patient refused snacks.

## 2011-11-08 ENCOUNTER — Other Ambulatory Visit: Payer: Self-pay | Admitting: *Deleted

## 2011-11-08 MED ORDER — AMLODIPINE BESYLATE 2.5 MG PO TABS: 1.2500 mg | ORAL_TABLET | Freq: Every day | ORAL | Status: DC

## 2011-11-08 MED ORDER — HYDROCHLOROTHIAZIDE 12.5 MG PO TABS: 12.5000 mg | ORAL_TABLET | Freq: Every day | ORAL | Status: DC

## 2011-11-08 NOTE — Telephone Encounter (Signed)
Fax Received. Refill Completed. Octavius Shin Chowoe (R.M.A)   

## 2011-11-08 NOTE — Telephone Encounter (Signed)
Fax Received. Refill Completed. Anuradha Chabot Chowoe (R.M.A)   

## 2011-11-09 ENCOUNTER — Other Ambulatory Visit: Payer: Self-pay | Admitting: *Deleted

## 2011-11-09 NOTE — Telephone Encounter (Signed)
Opened in Error.

## 2011-11-22 ENCOUNTER — Other Ambulatory Visit: Payer: Self-pay | Admitting: Medical Oncology

## 2011-11-22 ENCOUNTER — Other Ambulatory Visit: Payer: Self-pay | Admitting: *Deleted

## 2011-11-22 ENCOUNTER — Ambulatory Visit (HOSPITAL_BASED_OUTPATIENT_CLINIC_OR_DEPARTMENT_OTHER): Payer: Medicare Other

## 2011-11-22 ENCOUNTER — Other Ambulatory Visit (HOSPITAL_BASED_OUTPATIENT_CLINIC_OR_DEPARTMENT_OTHER): Payer: Medicare Other | Admitting: Lab

## 2011-11-22 DIAGNOSIS — D751 Secondary polycythemia: Secondary | ICD-10-CM

## 2011-11-22 DIAGNOSIS — D45 Polycythemia vera: Secondary | ICD-10-CM | POA: Diagnosis not present

## 2011-11-22 LAB — CBC WITH DIFFERENTIAL/PLATELET
BASO%: 0.4 % (ref 0.0–2.0)
Basophils Absolute: 0 10*3/uL (ref 0.0–0.1)
EOS%: 4.6 % (ref 0.0–7.0)
HCT: 46.3 % (ref 34.8–46.6)
HGB: 14.7 g/dL (ref 11.6–15.9)
LYMPH%: 9.4 % — ABNORMAL LOW (ref 14.0–49.7)
MCH: 26.9 pg (ref 25.1–34.0)
MCHC: 31.9 g/dL (ref 31.5–36.0)
MONO#: 0.7 10*3/uL (ref 0.1–0.9)
NEUT%: 77.2 % — ABNORMAL HIGH (ref 38.4–76.8)
Platelets: 433 10*3/uL — ABNORMAL HIGH (ref 145–400)
lymph#: 0.8 10*3/uL — ABNORMAL LOW (ref 0.9–3.3)

## 2011-11-22 NOTE — Patient Instructions (Signed)

## 2011-12-01 ENCOUNTER — Other Ambulatory Visit: Payer: Self-pay | Admitting: *Deleted

## 2011-12-01 DIAGNOSIS — D751 Secondary polycythemia: Secondary | ICD-10-CM

## 2011-12-01 MED ORDER — TANDEM PLUS 162-115.2-1 MG PO CAPS: 1.0000 | ORAL_CAPSULE | Freq: Every day | ORAL | Status: DC

## 2011-12-07 ENCOUNTER — Other Ambulatory Visit: Payer: Self-pay | Admitting: *Deleted

## 2011-12-07 DIAGNOSIS — D751 Secondary polycythemia: Secondary | ICD-10-CM

## 2011-12-07 MED ORDER — ANAGRELIDE HCL 0.5 MG PO CAPS: 0.5000 mg | ORAL_CAPSULE | Freq: Two times a day (BID) | ORAL | Status: DC

## 2011-12-20 ENCOUNTER — Other Ambulatory Visit: Payer: Self-pay | Admitting: *Deleted

## 2011-12-20 DIAGNOSIS — C50919 Malignant neoplasm of unspecified site of unspecified female breast: Secondary | ICD-10-CM

## 2011-12-20 DIAGNOSIS — D751 Secondary polycythemia: Secondary | ICD-10-CM

## 2011-12-21 ENCOUNTER — Telehealth: Payer: Self-pay | Admitting: *Deleted

## 2011-12-21 NOTE — Telephone Encounter (Signed)
md will be on vac on 12-23-2011 patient confirmed over the phone the new time but the same day

## 2011-12-22 ENCOUNTER — Telehealth: Payer: Self-pay | Admitting: *Deleted

## 2011-12-22 ENCOUNTER — Other Ambulatory Visit: Payer: Self-pay | Admitting: *Deleted

## 2011-12-22 DIAGNOSIS — D751 Secondary polycythemia: Secondary | ICD-10-CM

## 2011-12-22 MED ORDER — SODIUM CHLORIDE 0.9 % IV SOLN: Freq: Once | INTRAVENOUS | Status: DC

## 2011-12-22 NOTE — Telephone Encounter (Signed)
Per staff message and POF I have scheduled appts.  JMW  

## 2011-12-22 NOTE — Telephone Encounter (Signed)
Sent [name] email to set up patient's phlebotomy appointments every 30 days

## 2011-12-23 ENCOUNTER — Ambulatory Visit: Payer: Medicare Other

## 2011-12-23 ENCOUNTER — Other Ambulatory Visit: Payer: Medicare Other | Admitting: Lab

## 2011-12-23 ENCOUNTER — Other Ambulatory Visit (HOSPITAL_BASED_OUTPATIENT_CLINIC_OR_DEPARTMENT_OTHER): Payer: Medicare Other | Admitting: Lab

## 2011-12-23 ENCOUNTER — Ambulatory Visit: Payer: Medicare Other | Admitting: Oncology

## 2011-12-23 DIAGNOSIS — D751 Secondary polycythemia: Secondary | ICD-10-CM | POA: Diagnosis not present

## 2011-12-23 DIAGNOSIS — K2940 Chronic atrophic gastritis without bleeding: Secondary | ICD-10-CM

## 2011-12-23 DIAGNOSIS — C50919 Malignant neoplasm of unspecified site of unspecified female breast: Secondary | ICD-10-CM | POA: Diagnosis not present

## 2011-12-23 LAB — COMPREHENSIVE METABOLIC PANEL (CC13)
Albumin: 3.6 g/dL (ref 3.5–5.0)
Alkaline Phosphatase: 80 U/L (ref 40–150)
CO2: 29 mEq/L (ref 22–29)
Calcium: 9.8 mg/dL (ref 8.4–10.4)
Chloride: 103 mEq/L (ref 98–107)
Glucose: 102 mg/dl — ABNORMAL HIGH (ref 70–99)
Potassium: 4.3 mEq/L (ref 3.5–5.1)
Sodium: 137 mEq/L (ref 136–145)
Total Protein: 6.2 g/dL — ABNORMAL LOW (ref 6.4–8.3)

## 2011-12-23 LAB — CBC WITH DIFFERENTIAL/PLATELET
BASO%: 0.2 % (ref 0.0–2.0)
Basophils Absolute: 0 10*3/uL (ref 0.0–0.1)
Eosinophils Absolute: 0.2 10*3/uL (ref 0.0–0.5)
HCT: 43.8 % (ref 34.8–46.6)
HGB: 14.5 g/dL (ref 11.6–15.9)
LYMPH%: 7.2 % — ABNORMAL LOW (ref 14.0–49.7)
MONO#: 0.7 10*3/uL (ref 0.1–0.9)
NEUT#: 6.3 10*3/uL (ref 1.5–6.5)
NEUT%: 81.1 % — ABNORMAL HIGH (ref 38.4–76.8)
Platelets: 479 10*3/uL — ABNORMAL HIGH (ref 145–400)
WBC: 7.8 10*3/uL (ref 3.9–10.3)
lymph#: 0.6 10*3/uL — ABNORMAL LOW (ref 0.9–3.3)

## 2011-12-23 LAB — LACTATE DEHYDROGENASE (CC13): LDH: 201 U/L (ref 125–245)

## 2011-12-23 NOTE — Progress Notes (Signed)
Patient does not require phlebotomy today as her Hct is less than 45%.

## 2012-01-04 DIAGNOSIS — Z853 Personal history of malignant neoplasm of breast: Secondary | ICD-10-CM | POA: Diagnosis not present

## 2012-01-12 DIAGNOSIS — Z23 Encounter for immunization: Secondary | ICD-10-CM | POA: Diagnosis not present

## 2012-01-20 ENCOUNTER — Encounter: Payer: Self-pay | Admitting: Oncology

## 2012-01-24 ENCOUNTER — Ambulatory Visit (HOSPITAL_BASED_OUTPATIENT_CLINIC_OR_DEPARTMENT_OTHER): Payer: Medicare Other | Admitting: Oncology

## 2012-01-24 ENCOUNTER — Other Ambulatory Visit (HOSPITAL_BASED_OUTPATIENT_CLINIC_OR_DEPARTMENT_OTHER): Payer: Medicare Other | Admitting: Lab

## 2012-01-24 ENCOUNTER — Ambulatory Visit (HOSPITAL_BASED_OUTPATIENT_CLINIC_OR_DEPARTMENT_OTHER): Payer: Medicare Other

## 2012-01-24 VITALS — BP 118/74 | HR 92 | Temp 98.1°F | Resp 20 | Ht 64.0 in | Wt 99.9 lb

## 2012-01-24 DIAGNOSIS — D751 Secondary polycythemia: Secondary | ICD-10-CM

## 2012-01-24 DIAGNOSIS — I959 Hypotension, unspecified: Secondary | ICD-10-CM

## 2012-01-24 DIAGNOSIS — D45 Polycythemia vera: Secondary | ICD-10-CM

## 2012-01-24 DIAGNOSIS — C50919 Malignant neoplasm of unspecified site of unspecified female breast: Secondary | ICD-10-CM

## 2012-01-24 LAB — CBC WITH DIFFERENTIAL/PLATELET
BASO%: 0.1 % (ref 0.0–2.0)
EOS%: 2.6 % (ref 0.0–7.0)
LYMPH%: 6.2 % — ABNORMAL LOW (ref 14.0–49.7)
MCH: 27.6 pg (ref 25.1–34.0)
MCHC: 32.6 g/dL (ref 31.5–36.0)
MONO#: 0.8 10*3/uL (ref 0.1–0.9)
NEUT%: 83.7 % — ABNORMAL HIGH (ref 38.4–76.8)
Platelets: 453 10*3/uL — ABNORMAL HIGH (ref 145–400)
RBC: 5.93 10*6/uL — ABNORMAL HIGH (ref 3.70–5.45)
WBC: 10.5 10*3/uL — ABNORMAL HIGH (ref 3.9–10.3)

## 2012-01-24 NOTE — Patient Instructions (Signed)
Therapeutic Phlebotomy Therapeutic phlebotomy is the controlled removal of blood from your body for the purpose of treating a medical condition. It is similar to donating blood. Usually, about a pint (470 mL) of blood is removed. The average adult has 9 to 12 pints (4.3 to 5.7 L) of blood. Therapeutic phlebotomy may be used to treat the following medical conditions:  Hemochromatosis. This is a condition in which there is too much iron in the blood.  Polycythemia vera. This is a condition in which there are too many red cells in the blood.  Porphyria cutanea tarda. This is a disease usually passed from one generation to the next (inherited). It is a condition in which an important part of hemoglobin is not made properly. This results in the build up of abnormal amounts of porphyrins in the body.  Sickle cell disease. This is an inherited disease. It is a condition in which the red blood cells form an abnormal crescent shape rather than a round shape. LET YOUR CAREGIVER KNOW ABOUT:  Allergies.  Medicines taken including herbs, eyedrops, over-the-counter medicines, and creams.  Use of steroids (by mouth or creams).  Previous problems with anesthetics or numbing medicine.  History of blood clots.  History of bleeding or blood problems.  Previous surgery.  Possibility of pregnancy, if this applies. RISKS AND COMPLICATIONS This is a simple and safe procedure. Problems are unlikely. However, problems can occur and may include:  Nausea or lightheadedness.  Low blood pressure.  Soreness, bleeding, swelling, or bruising at the needle insertion site.  Infection. BEFORE THE PROCEDURE  This is a procedure that can be done as an outpatient. Confirm the time that you need to arrive for your procedure. Confirm whether there is a need to fast or withhold any medications. It is helpful to wear clothing with sleeves that can be raised above the elbow. A blood sample may be done to determine the  amount of red blood cells or iron in your blood. Plan ahead of time to have someone drive you home after the procedure. PROCEDURE The entire procedure from preparation through recovery takes about 1 hour. The actual collection takes about 10 to 15 minutes.  A needle will be inserted into your vein.  Tubing and a collection bag will be attached to that needle.  Blood will flow through the needle and tubing into the collection bag.  You may be asked to open and close your hand slowly and continuously during the entire collection.  Once the specified amount of blood has been removed from your body, the collection bag and tubing will be clamped.  The needle will be removed.  Pressure will be held on the site of the needle insertion to stop the bleeding. Then a bandage will be placed over the needle insertion site. AFTER THE PROCEDURE  Your recovery will be assessed and monitored. If there are no problems, as an outpatient, you should be able to go home shortly after the procedure.  Document Released: 06/22/2010 Document Revised: 04/12/2011 Document Reviewed: 06/22/2010 ExitCare Patient Information 2013 [employer].  

## 2012-01-24 NOTE — Progress Notes (Signed)
298 grams removed. Patient tolerated well. Nourishment provided. Unable to remove 500 grams as ordered due to poor venous access. Dr. Donnie Coffin notified, ok to wait for next phlebotomy in January for Dr. Donnie Coffin

## 2012-01-24 NOTE — Progress Notes (Signed)
Hematology and Oncology Follow Up Visit  ANGELISSA SUPAN [id number] 03/20/1926 78 y.o. 01/24/2012    HPI: 1. Shanah is an 78 year old [place] woman with a history of JAK-2 positive polycythemia on monthly phlebotomies for hematocrit greater than or equal to 45% and anagrelide 0.5 mg by mouth twice a day as well as ASA.  2. History right breast cancer, 2007. Had right lumpectomy followed by radiation therapy. S/p recent mammogram 12/22/10 @ [facility].  Interim History:   Seen today for routine three-month followup for history of polycythemia. She continues on anagrelide 0.5 mg by mouth twice a day. Blood counts are fairly stable, she skipped a phlebotomy last month. She tells me she is feeling "sluggish" which is usually an indication she needs phlebotomy. Also reports her blood pressure drops when she is phlebotomized and she feels badly for 2-3 days. Denies shortness of breath or chest pain. No nausea or emesis, continues to take iron replacement therapy. No hematochezia or melena, notices her stools are dark due to the iron replacement. Has a history of chronic gastritis. No easy bruisability. No fevers chills or night sweats.  Last mammogram was in November 2013, no evidence of malignancy.   A detailed review of systems is otherwise noncontributory.  Medications:   I have reviewed the patient's current medications.  Allergies:  Allergies  Allergen Reactions  . Sulfur     Pt doesn't remember reaction.   Physical Exam: Filed Vitals:   01/24/12 1433  BP: 118/74  Pulse: 92  Temp: 98.1 F (36.7 C)  Resp: 20    Body mass index is 17.15 kg/(m^2). Weight: 100 lbs. HEENT:  Sclerae anicteric, conjunctivae pink.   Lungs: Clear to auscultation bilaterally.  Heart:  Regular rate and rhythm.   Abdomen:  Soft, nontender.  Positive bowel sounds.  No organomegaly or masses palpated.   Musculoskeletal:  No focal spinal tenderness to palpation.  Extremities:  Benign.  No  peripheral edema or cyanosis.   Skin:  Benign.   Neuro:  Nonfocal, alert and oriented x 3.   Lab Results: Lab Results  Component Value Date   WBC 10.5* 01/24/2012   HGB 16.4* 01/24/2012   HCT 50.1* 01/24/2012   MCV 84.5 01/24/2012   PLT 453* 01/24/2012   NEUTROABS 8.8* 01/24/2012     Chemistry      Component Value Date/Time   NA 137 12/23/2011 1415   NA 135 09/03/2011 1154   K 4.3 12/23/2011 1415   K 4.8 09/03/2011 1154   CL 103 12/23/2011 1415   CL 100 09/03/2011 1154   CO2 29 12/23/2011 1415   CO2 24 09/03/2011 1154   BUN 17.0 12/23/2011 1415   BUN 15 09/03/2011 1154   CREATININE 1.1 12/23/2011 1415   CREATININE 1.11* 09/03/2011 1154      Component Value Date/Time   CALCIUM 9.8 12/23/2011 1415   CALCIUM 9.5 09/03/2011 1154   ALKPHOS 80 12/23/2011 1415   ALKPHOS 65 09/03/2011 1154   AST 20 12/23/2011 1415   AST 18 09/03/2011 1154   ALT 16 12/23/2011 1415   ALT 15 09/03/2011 1154   BILITOT 0.27 12/23/2011 1415   BILITOT 0.3 09/03/2011 1154      Assessment: 78 year old [place] woman with: 1. History of JAK-2 positive polycythemia on monthly phlebotomies for hematocrit greater than or equal to 45% and anagrelide 0.5 mg by mouth twice a day. . 2. Symptomatic hypotension following phlebotomy  Plan:  1. Ariabella will continue on anagrelide 0.5  mg by mouth twice a day. Her neck CBC will be performed at a 3 week interval, again the phlebotomy to be performed for hematocrit greater than or equal to 45%. She will have CBCs and phlebotomies obtained monthly, followup at three-month interval.  Will receive phlebotomy today. 2. Mammogram due November 2014   3. I will order IV fluids to be given after phlebotomy to minimize hypotension.   This plan was reviewed with the patient, who voices understanding and agreement.  She knows to call with any changes or problems.    Pierce Crane, MD 01/24/2012

## 2012-01-25 ENCOUNTER — Other Ambulatory Visit: Payer: Self-pay | Admitting: *Deleted

## 2012-01-25 ENCOUNTER — Telehealth: Payer: Self-pay | Admitting: Oncology

## 2012-01-25 NOTE — Telephone Encounter (Signed)
Former pt of [facility] being re-established w/DM. No pof was sent when pt was last seen on 12/23. S/w desk nurse (Val - covering) and per Val pt is to keep monthly lb/phleb and see DM in March (34month f/u). Val had initially sent a pof for f/u February due to she thought pt's last lb/phleb was February. Per Val pt to f/u w/DM w/March lb/phelb. lmonvm for pt confirming next appt for 02/23/12 and asked that pt call me re info for 04/24/12 appt. Also added comment to 1/22 appt notes that pt be sent to see me re reassignment.

## 2012-01-28 ENCOUNTER — Telehealth: Payer: Self-pay | Admitting: Oncology

## 2012-01-28 NOTE — Telephone Encounter (Signed)
Pt returned my call today and was given information re being re-established w/DM. Pt aware she will see DM 3/24. Also confirmed appts for 1/22 and 2/21.

## 2012-02-08 ENCOUNTER — Other Ambulatory Visit: Payer: Self-pay | Admitting: *Deleted

## 2012-02-08 DIAGNOSIS — D751 Secondary polycythemia: Secondary | ICD-10-CM

## 2012-02-08 MED ORDER — ANAGRELIDE HCL 0.5 MG PO CAPS: 0.5000 mg | ORAL_CAPSULE | Freq: Two times a day (BID) | ORAL | Status: DC

## 2012-02-23 ENCOUNTER — Ambulatory Visit (HOSPITAL_BASED_OUTPATIENT_CLINIC_OR_DEPARTMENT_OTHER): Payer: Medicare Other

## 2012-02-23 ENCOUNTER — Other Ambulatory Visit (HOSPITAL_BASED_OUTPATIENT_CLINIC_OR_DEPARTMENT_OTHER): Payer: Medicare Other | Admitting: Lab

## 2012-02-23 ENCOUNTER — Other Ambulatory Visit: Payer: Self-pay | Admitting: Oncology

## 2012-02-23 VITALS — BP 180/86 | HR 66 | Temp 97.1°F | Resp 16

## 2012-02-23 DIAGNOSIS — D751 Secondary polycythemia: Secondary | ICD-10-CM

## 2012-02-23 DIAGNOSIS — D45 Polycythemia vera: Secondary | ICD-10-CM

## 2012-02-23 LAB — CBC WITH DIFFERENTIAL/PLATELET
BASO%: 0.2 % (ref 0.0–2.0)
EOS%: 3.1 % (ref 0.0–7.0)
HCT: 51.5 % — ABNORMAL HIGH (ref 34.8–46.6)
MCH: 27.6 pg (ref 25.1–34.0)
MCHC: 33.2 g/dL (ref 31.5–36.0)
MONO#: 1 10*3/uL — ABNORMAL HIGH (ref 0.1–0.9)
NEUT%: 79.3 % — ABNORMAL HIGH (ref 38.4–76.8)
RBC: 6.2 10*6/uL — ABNORMAL HIGH (ref 3.70–5.45)
RDW: 15.6 % — ABNORMAL HIGH (ref 11.2–14.5)
WBC: 9.6 10*3/uL (ref 3.9–10.3)
lymph#: 0.7 10*3/uL — ABNORMAL LOW (ref 0.9–3.3)
nRBC: 0 % (ref 0–0)

## 2012-02-23 MED ORDER — SODIUM CHLORIDE 0.9 % IV SOLN
Freq: Once | INTRAVENOUS | Status: AC
  Administered 2012-02-23: 15:00:00 via INTRAVENOUS

## 2012-02-23 NOTE — Patient Instructions (Signed)
Therapeutic Phlebotomy Therapeutic phlebotomy is the controlled removal of blood from your body for the purpose of treating a medical condition. It is similar to donating blood. Usually, about a pint (470 mL) of blood is removed. The average adult has 9 to 12 pints (4.3 to 5.7 L) of blood. Therapeutic phlebotomy may be used to treat the following medical conditions:  Hemochromatosis. This is a condition in which there is too much iron in the blood.  Polycythemia vera. This is a condition in which there are too many red cells in the blood.  Porphyria cutanea tarda. This is a disease usually passed from one generation to the next (inherited). It is a condition in which an important part of hemoglobin is not made properly. This results in the build up of abnormal amounts of porphyrins in the body.  Sickle cell disease. This is an inherited disease. It is a condition in which the red blood cells form an abnormal crescent shape rather than a round shape. LET YOUR CAREGIVER KNOW ABOUT:  Allergies.  Medicines taken including herbs, eyedrops, over-the-counter medicines, and creams.  Use of steroids (by mouth or creams).  Previous problems with anesthetics or numbing medicine.  History of blood clots.  History of bleeding or blood problems.  Previous surgery.  Possibility of pregnancy, if this applies. RISKS AND COMPLICATIONS This is a simple and safe procedure. Problems are unlikely. However, problems can occur and may include:  Nausea or lightheadedness.  Low blood pressure.  Soreness, bleeding, swelling, or bruising at the needle insertion site.  Infection. BEFORE THE PROCEDURE  This is a procedure that can be done as an outpatient. Confirm the time that you need to arrive for your procedure. Confirm whether there is a need to fast or withhold any medications. It is helpful to wear clothing with sleeves that can be raised above the elbow. A blood sample may be done to determine the  amount of red blood cells or iron in your blood. Plan ahead of time to have someone drive you home after the procedure. PROCEDURE The entire procedure from preparation through recovery takes about 1 hour. The actual collection takes about 10 to 15 minutes.  A needle will be inserted into your vein.  Tubing and a collection bag will be attached to that needle.  Blood will flow through the needle and tubing into the collection bag.  You may be asked to open and close your hand slowly and continuously during the entire collection.  Once the specified amount of blood has been removed from your body, the collection bag and tubing will be clamped.  The needle will be removed.  Pressure will be held on the site of the needle insertion to stop the bleeding. Then a bandage will be placed over the needle insertion site. AFTER THE PROCEDURE  Your recovery will be assessed and monitored. If there are no problems, as an outpatient, you should be able to go home shortly after the procedure.  Document Released: 06/22/2010 Document Revised: 04/12/2011 Document Reviewed: 06/22/2010 ExitCare Patient Information 2013 [employer].  

## 2012-03-07 DIAGNOSIS — L819 Disorder of pigmentation, unspecified: Secondary | ICD-10-CM | POA: Diagnosis not present

## 2012-03-07 DIAGNOSIS — D1801 Hemangioma of skin and subcutaneous tissue: Secondary | ICD-10-CM | POA: Diagnosis not present

## 2012-03-07 DIAGNOSIS — L821 Other seborrheic keratosis: Secondary | ICD-10-CM | POA: Diagnosis not present

## 2012-03-07 DIAGNOSIS — D239 Other benign neoplasm of skin, unspecified: Secondary | ICD-10-CM | POA: Diagnosis not present

## 2012-03-07 DIAGNOSIS — L820 Inflamed seborrheic keratosis: Secondary | ICD-10-CM | POA: Diagnosis not present

## 2012-03-07 DIAGNOSIS — L609 Nail disorder, unspecified: Secondary | ICD-10-CM | POA: Diagnosis not present

## 2012-03-16 ENCOUNTER — Other Ambulatory Visit: Payer: Self-pay | Admitting: Oncology

## 2012-03-16 DIAGNOSIS — D751 Secondary polycythemia: Secondary | ICD-10-CM

## 2012-03-24 ENCOUNTER — Other Ambulatory Visit (HOSPITAL_BASED_OUTPATIENT_CLINIC_OR_DEPARTMENT_OTHER): Payer: Medicare Other | Admitting: Lab

## 2012-03-24 ENCOUNTER — Ambulatory Visit: Payer: Medicare Other

## 2012-03-24 VITALS — BP 169/76 | HR 73 | Temp 97.4°F

## 2012-03-24 DIAGNOSIS — D751 Secondary polycythemia: Secondary | ICD-10-CM

## 2012-03-24 DIAGNOSIS — D45 Polycythemia vera: Secondary | ICD-10-CM | POA: Diagnosis not present

## 2012-03-24 LAB — CBC WITH DIFFERENTIAL/PLATELET
Basophils Absolute: 0 10*3/uL (ref 0.0–0.1)
Eosinophils Absolute: 0.4 10*3/uL (ref 0.0–0.5)
HGB: 16.4 g/dL — ABNORMAL HIGH (ref 11.6–15.9)
MCV: 82.5 fL (ref 79.5–101.0)
MONO#: 1.2 10*3/uL — ABNORMAL HIGH (ref 0.1–0.9)
MONO%: 9.6 % (ref 0.0–14.0)
NEUT#: 9.8 10*3/uL — ABNORMAL HIGH (ref 1.5–6.5)
RBC: 5.93 10*6/uL — ABNORMAL HIGH (ref 3.70–5.45)
RDW: 15.7 % — ABNORMAL HIGH (ref 11.2–14.5)
WBC: 12.3 10*3/uL — ABNORMAL HIGH (ref 3.9–10.3)
lymph#: 0.8 10*3/uL — ABNORMAL LOW (ref 0.9–3.3)

## 2012-03-24 MED ORDER — SODIUM CHLORIDE 0.9 % IV SOLN
Freq: Once | INTRAVENOUS | Status: AC
  Administered 2012-03-24: 15:00:00 via INTRAVENOUS

## 2012-03-24 NOTE — Progress Notes (Signed)
Patient in for phlebotomy today, approx removed then patient was given NS as ordered. Patient encouraged to continue oral hydration today.

## 2012-03-24 NOTE — Patient Instructions (Addendum)
Polycythemia Vera  Polycythemia Vera is a condition in which the body makes too many red blood cells and there is no known cause. The red blood cells (erythrocytes) are the cells which carry the oxygen in your blood stream to the cells of your body. Because of the increased red blood cells, the blood becomes thicker and does not circulate as well. It would be similar to your car having oil which is too thick so it cannot start and circulate as well. When the blood is too thick it often causes headaches and dizziness. It may also cause blood clots. Even though the blood clots easier, these patients bleed easier. The bleeding is caused because the blood cells which help stop bleeding (platelets) do not function normally. It occurs in all age groups but is more common in the 50 to 70 year age range. TREATMENT  The treatment of polycythemia vera for many years has been blood removal (phlebotomy) which is similar to blood removal in a blood bank, however this blood is not used for donation. Hydroxyurea is used to supplement phlebotomy. Aspirin is commonly given to thin the blood as long as the patient does not have a problem with bleeding. Other drugs are used based on the progression of the disease. Document Released: 10/13/2000 Document Revised: 04/12/2011 Document Reviewed: 04/19/2008 ExitCare Patient Information 2013 [employer].  Therapeutic Phlebotomy Therapeutic phlebotomy is the controlled removal of blood from your body for the purpose of treating a medical condition. It is similar to donating blood. Usually, about a pint (470 mL) of blood is removed. The average adult has 9 to 12 pints (4.3 to 5.7 L) of blood. Therapeutic phlebotomy may be used to treat the following medical conditions:  Hemochromatosis. This is a condition in which there is too much iron in the blood.  Polycythemia vera. This is a condition in which there are too many red cells in the blood.  Porphyria cutanea tarda. This  is a disease usually passed from one generation to the next (inherited). It is a condition in which an important part of hemoglobin is not made properly. This results in the build up of abnormal amounts of porphyrins in the body.  Sickle cell disease. This is an inherited disease. It is a condition in which the red blood cells form an abnormal crescent shape rather than a round shape. LET YOUR CAREGIVER KNOW ABOUT:  Allergies.  Medicines taken including herbs, eyedrops, over-the-counter medicines, and creams.  Use of steroids (by mouth or creams).  Previous problems with anesthetics or numbing medicine.  History of blood clots.  History of bleeding or blood problems.  Previous surgery.  Possibility of pregnancy, if this applies. RISKS AND COMPLICATIONS This is a simple and safe procedure. Problems are unlikely. However, problems can occur and may include:  Nausea or lightheadedness.  Low blood pressure.  Soreness, bleeding, swelling, or bruising at the needle insertion site.  Infection. BEFORE THE PROCEDURE  This is a procedure that can be done as an outpatient. Confirm the time that you need to arrive for your procedure. Confirm whether there is a need to fast or withhold any medications. It is helpful to wear clothing with sleeves that can be raised above the elbow. A blood sample may be done to determine the amount of red blood cells or iron in your blood. Plan ahead of time to have someone drive you home after the procedure. PROCEDURE The entire procedure from preparation through recovery takes about 1 hour. The actual   collection takes about 10 to 15 minutes.  A needle will be inserted into your vein.  Tubing and a collection bag will be attached to that needle.  Blood will flow through the needle and tubing into the collection bag.  You may be asked to open and close your hand slowly and continuously during the entire collection.  Once the specified amount of blood  has been removed from your body, the collection bag and tubing will be clamped.  The needle will be removed.  Pressure will be held on the site of the needle insertion to stop the bleeding. Then a bandage will be placed over the needle insertion site. AFTER THE PROCEDURE  Your recovery will be assessed and monitored. If there are no problems, as an outpatient, you should be able to go home shortly after the procedure.  Document Released: 06/22/2010 Document Revised: 04/12/2011 Document Reviewed: 06/22/2010 ExitCare Patient Information 2013 [employer].  

## 2012-04-06 ENCOUNTER — Other Ambulatory Visit: Payer: Self-pay | Admitting: Internal Medicine

## 2012-04-19 ENCOUNTER — Other Ambulatory Visit: Payer: Self-pay | Admitting: *Deleted

## 2012-04-19 MED ORDER — POTASSIUM CHLORIDE ER 10 MEQ PO TBCR: 10.0000 meq | EXTENDED_RELEASE_TABLET | Freq: Every day | ORAL | Status: DC

## 2012-04-19 NOTE — Telephone Encounter (Signed)
Lm for pt to call office to make appointment for more refills. Fax Received. Refill Completed. Tammey Deeg Chowoe (R.M.A)

## 2012-04-20 ENCOUNTER — Other Ambulatory Visit: Payer: Self-pay | Admitting: Oncology

## 2012-04-24 ENCOUNTER — Ambulatory Visit: Payer: Medicare Other

## 2012-04-24 ENCOUNTER — Encounter: Payer: Self-pay | Admitting: Oncology

## 2012-04-24 ENCOUNTER — Other Ambulatory Visit: Payer: Medicare Other | Admitting: Lab

## 2012-04-24 ENCOUNTER — Other Ambulatory Visit (HOSPITAL_BASED_OUTPATIENT_CLINIC_OR_DEPARTMENT_OTHER): Payer: Medicare Other | Admitting: Lab

## 2012-04-24 ENCOUNTER — Ambulatory Visit (HOSPITAL_BASED_OUTPATIENT_CLINIC_OR_DEPARTMENT_OTHER): Payer: Medicare Other | Admitting: Oncology

## 2012-04-24 ENCOUNTER — Ambulatory Visit (HOSPITAL_BASED_OUTPATIENT_CLINIC_OR_DEPARTMENT_OTHER): Payer: Medicare Other

## 2012-04-24 VITALS — BP 174/84 | HR 64 | Temp 96.7°F | Resp 20 | Ht 64.0 in | Wt 100.4 lb

## 2012-04-24 VITALS — BP 181/77 | HR 68

## 2012-04-24 DIAGNOSIS — Z853 Personal history of malignant neoplasm of breast: Secondary | ICD-10-CM

## 2012-04-24 DIAGNOSIS — D751 Secondary polycythemia: Secondary | ICD-10-CM

## 2012-04-24 LAB — CBC WITH DIFFERENTIAL/PLATELET
BASO%: 0.3 % (ref 0.0–2.0)
Basophils Absolute: 0 10*3/uL (ref 0.0–0.1)
EOS%: 3.4 % (ref 0.0–7.0)
HCT: 47.1 % — ABNORMAL HIGH (ref 34.8–46.6)
HGB: 15.6 g/dL (ref 11.6–15.9)
LYMPH%: 6.6 % — ABNORMAL LOW (ref 14.0–49.7)
MCH: 27.9 pg (ref 25.1–34.0)
MCHC: 33.1 g/dL (ref 31.5–36.0)
NEUT%: 80.7 % — ABNORMAL HIGH (ref 38.4–76.8)
Platelets: 409 10*3/uL — ABNORMAL HIGH (ref 145–400)
lymph#: 0.6 10*3/uL — ABNORMAL LOW (ref 0.9–3.3)

## 2012-04-24 MED ORDER — SODIUM CHLORIDE 0.9 % IV SOLN
Freq: Once | INTRAVENOUS | Status: AC
  Administered 2012-04-24: 16:00:00 via INTRAVENOUS

## 2012-04-24 NOTE — Progress Notes (Signed)
Dict  [phone]

## 2012-04-25 ENCOUNTER — Encounter: Payer: Self-pay | Admitting: Medical Oncology

## 2012-04-25 NOTE — Progress Notes (Signed)
CC:   Juline Patch, M.D. Vesta Mixer, M.D.  PROBLEM LIST: 1. Polycythemia vera diagnosed in March 2009.  JAK2 mutation was     detected on 04/21/2007.  Hemoglobin in April 2009 was 18.4 with     hematocrit of 55.0.  Platelet count on July 12, 2007, was 868,000.     The patient has never had a bone marrow exam.  She has been     undergoing phlebotomies in the past, approximately once a month,     for hematocrit greater than or equal to 45%.  The patient has been     on anagrelide 0.5 mg twice daily, as well as aspirin.  She has not     had any thrombotic complications.  We will be carrying out a 1-unit     phlebotomy whenever the hematocrit is greater than or equal to 42%.     The patient does require normal saline 300 mL after phlebotomy     because of symptomatic hypotension. 2. 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     Lumpectomy on 01/04/2006 followed by radiation treatments under the     direction of Dr. Antony Blackbird.  The patient has not required any     systemic therapy.  She does have yearly mammograms. 3. Mitral valve prolapse. 4. Hypertension. 5. Diverticulosis. 6. History of esophageal stricture. 7. History of gastritis. 8. History of pelvic fractures after falling on August 23, 2006.  MEDICATIONS:  Reviewed and recorded. Current Outpatient Prescriptions  Medication Sig Dispense Refill  . amLODipine (NORVASC) 2.5 MG tablet Take 0.5 tablets (1.25 mg total) by mouth daily.  30 tablet  5  . anagrelide (AGRYLIN) 0.5 MG capsule Take 1 capsule (0.5 mg total) by mouth 2 (two) times daily.  60 capsule  2  . aspirin 81 MG tablet Take 81 mg by mouth daily.        . Cetirizine HCl (ZYRTEC PO) Take by mouth as needed.        . Cholecalciferol (VITAMIN D3) 1000 UNITS CAPS Take 2,600 mg by mouth 2 (two) times daily.       Marland Kitchen dicyclomine (BENTYL) 10 MG capsule TAKE  (1)  CAPSULE  TWICE DAILY AS NEEDED.  60 capsule  0  . FeFum-FePo-FA-B Cmp-C-Zn-Mn-Cu (TANDEM PLUS)  162-115.2-1 MG CAPS Take 1 capsule by mouth daily.  30 each  2  . fluticasone (FLONASE) 50 MCG/ACT nasal spray Place 2 sprays into the nose as needed.        Marland Kitchen GLUCOSAMINE PO Take 1,500 mg by mouth daily.      . hydrochlorothiazide (HYDRODIURIL) 12.5 MG tablet Take 1 tablet (12.5 mg total) by mouth daily.  30 tablet  5  . lisinopril (PRINIVIL,ZESTRIL) 20 MG tablet Take 1 tablet (20 mg total) by mouth daily.  30 tablet  6  . metoprolol succinate (TOPROL-XL) 50 MG 24 hr tablet Take 1 tablet (50 mg total) by mouth daily. Take with or immediately following a meal.  30 tablet  9  . Multiple Vitamin (MULTIVITAMIN) tablet Take 1 tablet by mouth daily.        . pantoprazole (PROTONIX) 40 MG tablet Take 40 mg by mouth as needed.        . pantoprazole (PROTONIX) 40 MG tablet Take 1 tablet (40 mg total) by mouth daily.  90 tablet  3  . potassium chloride (K-DUR) 10 MEQ tablet Take 1 tablet (10 mEq total) by mouth daily.  30 tablet  0   No current facility-administered medications for this visit.   I have instructed the patient to stop taking iron.  THERAPEUTIC PROGRAM:   -Anagrelide 0.5 mg daily. -Aspirin 81 mg daily. -1 unit phlebotomy for hematocrit greater than or equal to 42%.  The patient receives 300 mL of normal saline following each 1-unit Phlebotomy.   SMOKING HISTORY:  The patient has never smoked cigarettes.   HISTORY:  I am seeing [name] today for the first time for followup of her polycythemia vera associated with JAK2 mutation.  The patient has been receiving phlebotomies just about every month with the exception of December 23, 2011, when her hematocrit was 43.8.  On 02/23/2012 hematocrit was 51.5.  The patient has never had a stroke, myocardial infarction, or any thrombotic complications.  Her last phlebotomy was on 03/24/2012.  She has been widowed for the past 4 years.  She has children, grandchildren, and a great-granddaughter who live nearby.  The patient lives in a  2-level house.  She drives, she is quite active and spry for her age.  She is really without any complaints today.  She has had some symptomatic hypotension from her recent phlebotomies requiring saline infusion.  PHYSICAL EXAMINATION:  General:  The patient is a spry woman with youthful demeanor, also looks younger than her stated age of 69.  Weight is 100 pounds 6.4 ounces, which is stable.  Height 5 feet 4 inches, body surface area 1.43 sq m.  Vital Signs:  Blood pressure today 174/84.  The patient did not take her blood pressure medicine to avoid hypotension following her phlebotomy today.  Vital signs are normal.  HEENT:  There is no scleral icterus.  Mouth and pharynx are benign.  No peripheral adenopathy palpable.  Heart and lungs:  Normal.  Back:  There are lots of seborrheic keratoses.  Abdomen:  Notable for spleen tip that can be appreciated with inspiration.  No hepatomegaly or abdominal masses. Extremities:  No peripheral edema or clubbing.  Neurologic:  Exam is normal.  Breasts:  Breasts are atrophic, they are somewhat fibronodular. There is a deep indentation in the right breast just above the nipple- areolar complex at about the 12 o'clock position from the patient's previous lumpectomy.  No suspicious findings involving the breasts. Lymph nodes:  No axillary or inguinal adenopathy.  LABORATORY DATA:  White count 9.7, ANC 7.9, hemoglobin 15.6, hematocrit 47.1, platelets 409,000.  I do not believe chemistries were obtained today.  Chemistries from 12/23/2011 were essentially normal.  LDH was 201 on 12/23/2011.  IMAGING STUDIES:  Diagnostic bilateral digital mammogram carried out at [facility] on 01/01/2011 showed no suspicious findings.  IMPRESSION AND PLAN:  The patient is doing well.  She is due for phlebotomy today.  We will give her normal saline 300 mL.  I have scheduled her for CBC and possibly another phlebotomy in 2 weeks, which will be April 7th, and  thereafter every 4 weeks, specifically May 5th, June 2nd, and June 30th.  Our target will be to do a phlebotomy whenever the hematocrit is greater than or equal to 42%.  I have instructed the patient to stop her iron.  She takes this 1 a day.  She does have some gastric distress from this.  I am hopeful that her hemoglobin and hematocrit will come under better control and that hopefully she will not need phlebotomies quite as frequently as she has been getting them, approximately monthly.  We may want to consider  adding a small dose of hydroxyurea to decrease her need for phlebotomies.  We will continue the current program of anagrelide 0.5 mg twice daily and aspirin for now. Platelet count today was 409,000, which is satisfactory.  We will plan to see [name] again around June 30th, at which time we will check CBC and chemistries including an LDH.  She is tentatively scheduled for phlebotomy on that date.  As stated, she will be having CBCs and phlebotomies scheduled monthly as needed.  I put in CBC and phlebotomy as a standing order for 10 events.    Records from this patient's prior treatments and followup visits were reviewed.    ______________________________ Samul Dada, M.D. DSM/MEDQ  D:  04/24/2012  T:  04/25/2012  Job:  [id number]

## 2012-04-25 NOTE — Progress Notes (Signed)
Received a refill request for pt's Iron. Per Dr. Mamie Levers note 04/24/12 he asked pt to stop her Iron. This prescription not refilled.

## 2012-04-26 ENCOUNTER — Other Ambulatory Visit: Payer: Self-pay | Admitting: *Deleted

## 2012-04-26 MED ORDER — LISINOPRIL 20 MG PO TABS: 20.0000 mg | ORAL_TABLET | Freq: Every day | ORAL | Status: DC

## 2012-04-26 NOTE — Telephone Encounter (Signed)
Fax Received. Refill Completed. [name] (R.M.A)   

## 2012-05-05 ENCOUNTER — Telehealth: Payer: Self-pay | Admitting: Oncology

## 2012-05-05 NOTE — Telephone Encounter (Signed)
Called pt and left message regarding appt for lab and phlebotomy for 05/08/12, advised pt to get calendar for May and June 2014

## 2012-05-08 ENCOUNTER — Ambulatory Visit (HOSPITAL_BASED_OUTPATIENT_CLINIC_OR_DEPARTMENT_OTHER): Payer: Medicare Other

## 2012-05-08 ENCOUNTER — Other Ambulatory Visit (HOSPITAL_BASED_OUTPATIENT_CLINIC_OR_DEPARTMENT_OTHER): Payer: Medicare Other | Admitting: Lab

## 2012-05-08 VITALS — BP 128/52 | HR 69 | Temp 97.9°F | Resp 20

## 2012-05-08 DIAGNOSIS — D751 Secondary polycythemia: Secondary | ICD-10-CM

## 2012-05-08 LAB — CBC WITH DIFFERENTIAL/PLATELET
BASO%: 0.3 % (ref 0.0–2.0)
EOS%: 4 % (ref 0.0–7.0)
HCT: 43.8 % (ref 34.8–46.6)
HGB: 14.3 g/dL (ref 11.6–15.9)
MCH: 26.9 pg (ref 25.1–34.0)
MCHC: 32.6 g/dL (ref 31.5–36.0)
MONO#: 0.7 10*3/uL (ref 0.1–0.9)
NEUT%: 79.9 % — ABNORMAL HIGH (ref 38.4–76.8)
RDW: 14.2 % (ref 11.2–14.5)
WBC: 10.1 10*3/uL (ref 3.9–10.3)
lymph#: 0.9 10*3/uL (ref 0.9–3.3)
nRBC: 0 % (ref 0–0)

## 2012-05-08 MED ORDER — SODIUM CHLORIDE 0.9 % IV SOLN
Freq: Once | INTRAVENOUS | Status: AC
  Administered 2012-05-08: 12:00:00 via INTRAVENOUS

## 2012-05-08 NOTE — Progress Notes (Signed)
Duplicate

## 2012-05-08 NOTE — Patient Instructions (Addendum)
Therapeutic Phlebotomy Therapeutic phlebotomy is the controlled removal of blood from your body for the purpose of treating a medical condition. It is similar to donating blood. Usually, about a pint (470 mL) of blood is removed. The average adult has 9 to 12 pints (4.3 to 5.7 L) of blood. Therapeutic phlebotomy may be used to treat the following medical conditions:  Hemochromatosis. This is a condition in which there is too much iron in the blood.  Polycythemia vera. This is a condition in which there are too many red cells in the blood.  Porphyria cutanea tarda. This is a disease usually passed from one generation to the next (inherited). It is a condition in which an important part of hemoglobin is not made properly. This results in the build up of abnormal amounts of porphyrins in the body.  Sickle cell disease. This is an inherited disease. It is a condition in which the red blood cells form an abnormal crescent shape rather than a round shape. LET YOUR CAREGIVER KNOW ABOUT:  Allergies.  Medicines taken including herbs, eyedrops, over-the-counter medicines, and creams.  Use of steroids (by mouth or creams).  Previous problems with anesthetics or numbing medicine.  History of blood clots.  History of bleeding or blood problems.  Previous surgery.  Possibility of pregnancy, if this applies. RISKS AND COMPLICATIONS This is a simple and safe procedure. Problems are unlikely. However, problems can occur and may include:  Nausea or lightheadedness.  Low blood pressure.  Soreness, bleeding, swelling, or bruising at the needle insertion site.  Infection. BEFORE THE PROCEDURE  This is a procedure that can be done as an outpatient. Confirm the time that you need to arrive for your procedure. Confirm whether there is a need to fast or withhold any medications. It is helpful to wear clothing with sleeves that can be raised above the elbow. A blood sample may be done to determine the  amount of red blood cells or iron in your blood. Plan ahead of time to have someone drive you home after the procedure. PROCEDURE The entire procedure from preparation through recovery takes about 1 hour. The actual collection takes about 10 to 15 minutes.  A needle will be inserted into your vein.  Tubing and a collection bag will be attached to that needle.  Blood will flow through the needle and tubing into the collection bag.  You may be asked to open and close your hand slowly and continuously during the entire collection.  Once the specified amount of blood has been removed from your body, the collection bag and tubing will be clamped.  The needle will be removed.  Pressure will be held on the site of the needle insertion to stop the bleeding. Then a bandage will be placed over the needle insertion site. AFTER THE PROCEDURE  Your recovery will be assessed and monitored. If there are no problems, as an outpatient, you should be able to go home shortly after the procedure.  Document Released: 06/22/2010 Document Revised: 04/12/2011 Document Reviewed: 06/22/2010 [employer] Patient Information 2013 [employer].  

## 2012-05-08 NOTE — Progress Notes (Signed)
Hct  43.8  Today.   Phlebotomy performed in right anterior forearm with 18G x 1.16 in angiocath without difficulty.  429 gms of blood obtained and wasted.  Normal saline 300 ml given post phlebotomy as per order.  Pt tolerated procedure without problems and no complaints voiced.  Pt was stable at discharged by self via ambulation.  AVS given to pt.

## 2012-05-11 ENCOUNTER — Other Ambulatory Visit: Payer: Self-pay | Admitting: *Deleted

## 2012-05-11 DIAGNOSIS — D751 Secondary polycythemia: Secondary | ICD-10-CM

## 2012-05-11 MED ORDER — ANAGRELIDE HCL 0.5 MG PO CAPS: 0.5000 mg | ORAL_CAPSULE | Freq: Two times a day (BID) | ORAL | Status: DC

## 2012-05-15 DIAGNOSIS — K219 Gastro-esophageal reflux disease without esophagitis: Secondary | ICD-10-CM | POA: Diagnosis not present

## 2012-05-15 DIAGNOSIS — Z0000 Encounter for general adult medical examination without abnormal findings: Secondary | ICD-10-CM | POA: Diagnosis not present

## 2012-05-15 DIAGNOSIS — I10 Essential (primary) hypertension: Secondary | ICD-10-CM | POA: Diagnosis not present

## 2012-05-15 DIAGNOSIS — K589 Irritable bowel syndrome without diarrhea: Secondary | ICD-10-CM | POA: Diagnosis not present

## 2012-05-17 DIAGNOSIS — N390 Urinary tract infection, site not specified: Secondary | ICD-10-CM | POA: Diagnosis not present

## 2012-05-17 DIAGNOSIS — I10 Essential (primary) hypertension: Secondary | ICD-10-CM | POA: Diagnosis not present

## 2012-05-24 ENCOUNTER — Other Ambulatory Visit: Payer: Self-pay | Admitting: Oncology

## 2012-05-24 ENCOUNTER — Ambulatory Visit: Payer: Medicare Other

## 2012-05-24 ENCOUNTER — Telehealth: Payer: Self-pay

## 2012-05-24 ENCOUNTER — Other Ambulatory Visit: Payer: Self-pay | Admitting: Internal Medicine

## 2012-05-24 ENCOUNTER — Other Ambulatory Visit: Payer: Medicare Other | Admitting: Lab

## 2012-05-24 ENCOUNTER — Telehealth: Payer: Self-pay | Admitting: Oncology

## 2012-05-24 ENCOUNTER — Other Ambulatory Visit: Payer: Self-pay

## 2012-05-24 DIAGNOSIS — D751 Secondary polycythemia: Secondary | ICD-10-CM

## 2012-05-24 NOTE — Telephone Encounter (Signed)
Added lb for 4/24. Not able to reach pt. Per desk nurse pt called her and she gv pt appt.

## 2012-05-24 NOTE — Telephone Encounter (Signed)
Pt returned our call and clarified lab appt tomorrow at 0930, she gave [place] her cell number as well.

## 2012-05-24 NOTE — Telephone Encounter (Signed)
lvm that labs from Dr Ricki Miller on 416 were elevated and Dr Arline Asp wants her to come in for recheck today or tomorrow. Expect a call from scheduler.

## 2012-05-25 ENCOUNTER — Other Ambulatory Visit (HOSPITAL_BASED_OUTPATIENT_CLINIC_OR_DEPARTMENT_OTHER): Payer: Medicare Other

## 2012-05-25 DIAGNOSIS — D751 Secondary polycythemia: Secondary | ICD-10-CM

## 2012-05-25 LAB — CBC WITH DIFFERENTIAL/PLATELET
Basophils Absolute: 0 10*3/uL (ref 0.0–0.1)
Eosinophils Absolute: 0.4 10*3/uL (ref 0.0–0.5)
HCT: 39.7 % (ref 34.8–46.6)
HGB: 12.7 g/dL (ref 11.6–15.9)
LYMPH%: 6.3 % — ABNORMAL LOW (ref 14.0–49.7)
MCV: 79.8 fL (ref 79.5–101.0)
MONO#: 0.9 10*3/uL (ref 0.1–0.9)
MONO%: 8.4 % (ref 0.0–14.0)
NEUT#: 8.8 10*3/uL — ABNORMAL HIGH (ref 1.5–6.5)
NEUT%: 81.9 % — ABNORMAL HIGH (ref 38.4–76.8)
Platelets: 640 10*3/uL — ABNORMAL HIGH (ref 145–400)
WBC: 10.7 10*3/uL — ABNORMAL HIGH (ref 3.9–10.3)

## 2012-05-25 LAB — COMPREHENSIVE METABOLIC PANEL (CC13)
Alkaline Phosphatase: 87 U/L (ref 40–150)
BUN: 13 mg/dL (ref 7.0–26.0)
CO2: 27 mEq/L (ref 22–29)
Glucose: 103 mg/dl — ABNORMAL HIGH (ref 70–99)
Sodium: 133 mEq/L — ABNORMAL LOW (ref 136–145)
Total Bilirubin: 0.38 mg/dL (ref 0.20–1.20)
Total Protein: 6.7 g/dL (ref 6.4–8.3)

## 2012-05-25 LAB — LACTATE DEHYDROGENASE (CC13): LDH: 189 U/L (ref 125–245)

## 2012-05-26 ENCOUNTER — Other Ambulatory Visit: Payer: Self-pay | Admitting: *Deleted

## 2012-05-26 ENCOUNTER — Other Ambulatory Visit: Payer: Self-pay

## 2012-05-26 ENCOUNTER — Encounter: Payer: Self-pay | Admitting: Oncology

## 2012-05-26 DIAGNOSIS — D751 Secondary polycythemia: Secondary | ICD-10-CM

## 2012-05-26 MED ORDER — HYDROCHLOROTHIAZIDE 12.5 MG PO TABS: 12.5000 mg | ORAL_TABLET | Freq: Every day | ORAL | Status: DC

## 2012-05-26 MED ORDER — HYDROXYUREA 500 MG PO CAPS: 500.0000 mg | ORAL_CAPSULE | Freq: Every day | ORAL | Status: DC

## 2012-05-26 NOTE — Telephone Encounter (Signed)
S/w pt that Dr Arline Asp wants her to start hydrea 1 tab (500mg ) daily, in addition to the anagrelide 0.5 mg BID she is already taking. Pt expressed understanding

## 2012-05-26 NOTE — Progress Notes (Signed)
This patient is presently on anagrelide 0.5 mg twice a day.  Her most recent platelet count on 05/25/2012 was 640,000.  We are starting her on hydroxyurea 500 mg daily.  We will check CBC monthly.  The next CBC is scheduled for 06/05/2012.  Next appointment with me is on 07/31/2012.

## 2012-06-05 ENCOUNTER — Ambulatory Visit: Payer: Medicare Other

## 2012-06-05 ENCOUNTER — Other Ambulatory Visit (HOSPITAL_BASED_OUTPATIENT_CLINIC_OR_DEPARTMENT_OTHER): Payer: Medicare Other | Admitting: Lab

## 2012-06-05 DIAGNOSIS — D751 Secondary polycythemia: Secondary | ICD-10-CM

## 2012-06-05 LAB — CBC WITH DIFFERENTIAL/PLATELET
BASO%: 0.3 % (ref 0.0–2.0)
Eosinophils Absolute: 0.2 10*3/uL (ref 0.0–0.5)
HCT: 39.4 % (ref 34.8–46.6)
MCHC: 32.6 g/dL (ref 31.5–36.0)
MONO#: 0.6 10*3/uL (ref 0.1–0.9)
NEUT#: 4.9 10*3/uL (ref 1.5–6.5)
Platelets: 546 10*3/uL — ABNORMAL HIGH (ref 145–400)
RBC: 5.03 10*6/uL (ref 3.70–5.45)
WBC: 6.4 10*3/uL (ref 3.9–10.3)
lymph#: 0.6 10*3/uL — ABNORMAL LOW (ref 0.9–3.3)

## 2012-06-05 NOTE — Progress Notes (Signed)
Patients hct 39.4 per Dr. Mamie Levers last note the patient does not require phlebotomy today. Patient verbalized understanding. Patient instructed to call our office shall she have any questions or concerns.

## 2012-06-19 ENCOUNTER — Other Ambulatory Visit: Payer: Self-pay | Admitting: *Deleted

## 2012-06-19 MED ORDER — POTASSIUM CHLORIDE ER 10 MEQ PO TBCR: 10.0000 meq | EXTENDED_RELEASE_TABLET | Freq: Every day | ORAL | Status: DC

## 2012-06-19 NOTE — Telephone Encounter (Signed)
Fax Received. Refill Completed. [name] (R.M.A)   

## 2012-06-30 ENCOUNTER — Other Ambulatory Visit: Payer: Self-pay | Admitting: Cardiovascular Disease

## 2012-06-30 ENCOUNTER — Encounter: Payer: Self-pay | Admitting: Cardiovascular Disease

## 2012-07-03 ENCOUNTER — Other Ambulatory Visit: Payer: Self-pay | Admitting: Oncology

## 2012-07-03 ENCOUNTER — Other Ambulatory Visit: Payer: Self-pay | Admitting: *Deleted

## 2012-07-03 ENCOUNTER — Other Ambulatory Visit (HOSPITAL_BASED_OUTPATIENT_CLINIC_OR_DEPARTMENT_OTHER): Payer: Medicare Other | Admitting: Lab

## 2012-07-03 ENCOUNTER — Ambulatory Visit (HOSPITAL_BASED_OUTPATIENT_CLINIC_OR_DEPARTMENT_OTHER): Payer: Medicare Other

## 2012-07-03 VITALS — BP 159/64 | HR 67 | Temp 97.2°F | Resp 18

## 2012-07-03 DIAGNOSIS — D751 Secondary polycythemia: Secondary | ICD-10-CM

## 2012-07-03 DIAGNOSIS — D45 Polycythemia vera: Secondary | ICD-10-CM

## 2012-07-03 LAB — CBC WITH DIFFERENTIAL/PLATELET
Basophils Absolute: 0 10*3/uL (ref 0.0–0.1)
Eosinophils Absolute: 0.1 10*3/uL (ref 0.0–0.5)
HCT: 42.4 % (ref 34.8–46.6)
LYMPH%: 13 % — ABNORMAL LOW (ref 14.0–49.7)
MCV: 77.8 fL — ABNORMAL LOW (ref 79.5–101.0)
MONO%: 12 % (ref 0.0–14.0)
NEUT#: 3.7 10*3/uL (ref 1.5–6.5)
NEUT%: 72.3 % (ref 38.4–76.8)
Platelets: 121 10*3/uL — ABNORMAL LOW (ref 145–400)
RBC: 5.45 10*6/uL (ref 3.70–5.45)

## 2012-07-03 MED ORDER — SODIUM CHLORIDE 0.9 % IV SOLN
Freq: Once | INTRAVENOUS | Status: AC
  Administered 2012-07-03: 12:00:00 via INTRAVENOUS

## 2012-07-03 NOTE — Patient Instructions (Addendum)

## 2012-07-05 ENCOUNTER — Telehealth: Payer: Self-pay | Admitting: Oncology

## 2012-07-05 ENCOUNTER — Encounter: Payer: Self-pay | Admitting: Cardiovascular Disease

## 2012-07-05 ENCOUNTER — Ambulatory Visit (INDEPENDENT_AMBULATORY_CARE_PROVIDER_SITE_OTHER): Payer: Medicare Other | Admitting: Cardiovascular Disease

## 2012-07-05 VITALS — BP 144/72 | HR 72 | Ht 64.5 in | Wt 99.8 lb

## 2012-07-05 DIAGNOSIS — I10 Essential (primary) hypertension: Secondary | ICD-10-CM

## 2012-07-05 DIAGNOSIS — I341 Nonrheumatic mitral (valve) prolapse: Secondary | ICD-10-CM

## 2012-07-05 DIAGNOSIS — I059 Rheumatic mitral valve disease, unspecified: Secondary | ICD-10-CM

## 2012-07-05 NOTE — Telephone Encounter (Signed)
Added lb appt and lmonvm for pt re appt for 6/9. Other appts remain the same.

## 2012-07-05 NOTE — Progress Notes (Signed)
[name] Date of Birth  [birth year] [facility] Office  [address]    [address]   [address] [place], [place]  [address]    [name], [place]  [address] [phone]  Fax  [phone]  [phone]  Fax [phone]   History of Present Illness:  Problem list: 1. Mitral valve prolapse 2. Breast cancer 3. Polycythemia 4. Hypertension  [name] is an 79 year old female with a history of mitral valve prolapse, hypertension, and polycythemia. She has phlebotomy  about once a month for her polycythemia. Her blood pressure typically is little bit elevated and then goes down quickly after her phlebotomy. She's feeling quite well. She's exercising regularly.  July 05, 2012:  No CP.  She has mild  Dyspnea.  She is still getting phlebotomy regularly.  She has some mild dyspnea when she first lies down but it typically resolves and she doe not have to sleep on any pillows.  She is able to do her normal activities without problems.   Current Outpatient Prescriptions on File Prior to Visit  Medication Sig Dispense Refill  . amLODipine (NORVASC) 2.5 MG tablet Take 0.5 tablets (1.25 mg total) by mouth daily.  30 tablet  5  . anagrelide (AGRYLIN) 0.5 MG capsule Take 1 capsule (0.5 mg total) by mouth 2 (two) times daily.  60 capsule  2  . aspirin 81 MG tablet Take 81 mg by mouth daily.        . Cetirizine HCl (ZYRTEC PO) Take by mouth as needed.        . Cholecalciferol (VITAMIN D3) 1000 UNITS CAPS Take 2,600 mg by mouth 2 (two) times daily.       Marland Kitchen dicyclomine (BENTYL) 10 MG capsule TAKE  (1)  CAPSULE  TWICE DAILY AS NEEDED.  60 capsule  1  . fluticasone (FLONASE) 50 MCG/ACT nasal spray Place 2 sprays into the nose as needed.        Marland Kitchen GLUCOSAMINE PO Take 1,500 mg by mouth daily.      . hydrochlorothiazide (HYDRODIURIL) 12.5 MG tablet Take 1 tablet (12.5 mg total) by mouth daily.  30 tablet  5  . hydroxyurea (HYDREA) 500 MG capsule Take 1 capsule (500 mg total)  by mouth daily. May take with food to minimize GI side effects.  30 capsule  3  . lisinopril (PRINIVIL,ZESTRIL) 20 MG tablet Take 1 tablet (20 mg total) by mouth daily.  30 tablet  3  . metoprolol succinate (TOPROL-XL) 50 MG 24 hr tablet Take 1 tablet (50 mg total) by mouth daily. Take with or immediately following a meal.  30 tablet  9  . Multiple Vitamin (MULTIVITAMIN) tablet Take 1 tablet by mouth daily.        . pantoprazole (PROTONIX) 40 MG tablet Take 40 mg by mouth as needed.        . potassium chloride (K-DUR) 10 MEQ tablet Take 1 tablet (10 mEq total) by mouth daily.  30 tablet  0   No current facility-administered medications on file prior to visit.    Allergies  Allergen Reactions  . Sulfur     Pt doesn't remember reaction.    Past Medical History  Diagnosis Date  . MVP (mitral valve prolapse)   . Hypertension   . Chest tightness   . Palpitation   . PVC's (premature ventricular contractions)   . Breast cancer     right  . Polycythemia   .  Chronic gastritis   . Diverticulosis   . Candida esophagitis   . Arthritis     Past Surgical History  Procedure Laterality Date  . Tonsillectomy    . Appendectomy    . Cardiovascular stress test  12/23/2000    EF 70%  . Transthoracic echocardiogram  03/05/2010    EF 55-60%  . Abdominal hysterectomy      ovaries taken  . Breast lumpectomy      right  . Elbow fracture surgery      left  . Cataract extraction      bilateral  . Hematoma evacuation      right    History  Smoking status  . Former Smoker  . Quit date: 08/24/1980  Smokeless tobacco  . Never Used    History  Alcohol Use  . Yes    Comment: occasional    Family History  Problem Relation Age of Onset  . Stomach cancer Mother   . Hypertension Father   . Stroke Father   . Hypertension Sister   . Hypertension Brother   . Prostate cancer Brother   . Colon cancer Neg Hx   . Prostate cancer Father   . Diabetes Brother     Reviw of Systems:    Reviewed in the HPI.  All other systems are negative.  Physical Exam: BP 144/72  Pulse 72  Ht 5' 4.5" (1.638 m)  Wt 99 lb 12.8 oz (45.269 kg)  BMI 16.87 kg/m2 The patient is alert and oriented x 3.  The mood and affect are normal.   Skin: warm and dry.  Color is normal.   HEENT:   Neck is supple. His membranes are moist. The carotids are normal. No JVD. Lungs: Lungs are clear.  Heart: Regular rate S1-S2.  She is a soft systolic murmur. Abdomen: She is thin. She has good bowel sounds. There are no bruits  Extremities:  No clubbing cyanosis or edema Neuro:  Nonfocal.    ECG: July 05, 2012:  Normal sinus rhythm at 72. .   The EKG is normal.  Assessment / Plan:

## 2012-07-05 NOTE — Assessment & Plan Note (Signed)
[name] is doing well.  She has mild dyspnea when she initially goes to bed but  This resolves quickly.   She has not had any any worsening dyspnea.    Will see her again in 1 year.

## 2012-07-05 NOTE — Assessment & Plan Note (Signed)
She still eats some extra salt.  Have cautioned her about eating extra salt.

## 2012-07-05 NOTE — Patient Instructions (Addendum)
Your physician wants you to follow-up in: 1 year  You will receive a reminder letter in the mail two months in advance. If you don't receive a letter, please call our office to schedule the follow-up appointment.  Your physician recommends that you continue on your current medications as directed. Please refer to the Current Medication list given to you today.  

## 2012-07-10 ENCOUNTER — Telehealth: Payer: Self-pay | Admitting: Medical Oncology

## 2012-07-10 ENCOUNTER — Other Ambulatory Visit (HOSPITAL_BASED_OUTPATIENT_CLINIC_OR_DEPARTMENT_OTHER): Payer: Medicare Other

## 2012-07-10 DIAGNOSIS — D751 Secondary polycythemia: Secondary | ICD-10-CM

## 2012-07-10 LAB — CBC WITH DIFFERENTIAL/PLATELET
BASO%: 0.2 % (ref 0.0–2.0)
Basophils Absolute: 0 10e3/uL (ref 0.0–0.1)
EOS%: 1.7 % (ref 0.0–7.0)
Eosinophils Absolute: 0.1 10e3/uL (ref 0.0–0.5)
HCT: 37 % (ref 34.8–46.6)
HGB: 11.9 g/dL (ref 11.6–15.9)
LYMPH%: 11.4 % — ABNORMAL LOW (ref 14.0–49.7)
MCH: 25.3 pg (ref 25.1–34.0)
MCHC: 32.2 g/dL (ref 31.5–36.0)
MCV: 78.6 fL — ABNORMAL LOW (ref 79.5–101.0)
MONO#: 0.5 10e3/uL (ref 0.1–0.9)
MONO%: 9.9 % (ref 0.0–14.0)
NEUT#: 4.1 10e3/uL (ref 1.5–6.5)
NEUT%: 76.8 % (ref 38.4–76.8)
Platelets: 79 10e3/uL — ABNORMAL LOW (ref 145–400)
RBC: 4.71 10e6/uL (ref 3.70–5.45)
RDW: 19.3 % — ABNORMAL HIGH (ref 11.2–14.5)
WBC: 5.4 10e3/uL (ref 3.9–10.3)
lymph#: 0.6 10e3/uL — ABNORMAL LOW (ref 0.9–3.3)
nRBC: 0 % (ref 0–0)

## 2012-07-10 NOTE — Telephone Encounter (Signed)
I called pt to inform her that Dr. Arline Asp would like for her to hold the Hydrea. Her platelet count has dropped. He is going to set her up to get weekly labs. I informed her that Dr. Vedia Pereyra will let her know when to resume the hydrea. She voiced understanding.

## 2012-07-11 ENCOUNTER — Telehealth: Payer: Self-pay | Admitting: *Deleted

## 2012-07-11 ENCOUNTER — Encounter: Payer: Self-pay | Admitting: Oncology

## 2012-07-11 NOTE — Telephone Encounter (Signed)
sw pt gv lab appts for 6/16, 6/23, and  appts times for 6/30. Pt stated that she will be here for her lab on 6/16, and her appts on 6/30. However she will not be here to attend her lab appt for 6/23, because she will be out of town for a week...td

## 2012-07-11 NOTE — Progress Notes (Signed)
Patient was instructed to stop hydroxyurea because of a drop in her platelet count from over 600,000 down to 79,000 on 07/10/2012. Patient had been taking hydroxyurea 500 mg a day since 05/26/2012.   We will be checking CBCs weekly and will resume hydroxyurea at a reduced dose when the platelet count has returned to normal.

## 2012-07-17 ENCOUNTER — Other Ambulatory Visit: Payer: Self-pay

## 2012-07-17 ENCOUNTER — Other Ambulatory Visit (HOSPITAL_BASED_OUTPATIENT_CLINIC_OR_DEPARTMENT_OTHER): Payer: Medicare Other | Admitting: Lab

## 2012-07-17 DIAGNOSIS — D751 Secondary polycythemia: Secondary | ICD-10-CM

## 2012-07-17 LAB — CBC WITH DIFFERENTIAL/PLATELET
BASO%: 0.2 % (ref 0.0–2.0)
Eosinophils Absolute: 0.2 10*3/uL (ref 0.0–0.5)
HCT: 36.1 % (ref 34.8–46.6)
LYMPH%: 14 % (ref 14.0–49.7)
MCHC: 32.7 g/dL (ref 31.5–36.0)
MONO#: 0.6 10*3/uL (ref 0.1–0.9)
NEUT#: 3.5 10*3/uL (ref 1.5–6.5)
Platelets: 79 10*3/uL — ABNORMAL LOW (ref 145–400)
RBC: 4.57 10*6/uL (ref 3.70–5.45)
WBC: 5 10*3/uL (ref 3.9–10.3)
lymph#: 0.7 10*3/uL — ABNORMAL LOW (ref 0.9–3.3)

## 2012-07-17 MED ORDER — DICYCLOMINE HCL 10 MG PO CAPS: ORAL_CAPSULE | ORAL | Status: DC

## 2012-07-21 ENCOUNTER — Other Ambulatory Visit: Payer: Self-pay

## 2012-07-21 MED ORDER — POTASSIUM CHLORIDE ER 10 MEQ PO TBCR: 10.0000 meq | EXTENDED_RELEASE_TABLET | Freq: Every day | ORAL | Status: DC

## 2012-07-24 ENCOUNTER — Other Ambulatory Visit: Payer: Medicare Other | Admitting: Lab

## 2012-07-31 ENCOUNTER — Ambulatory Visit (HOSPITAL_BASED_OUTPATIENT_CLINIC_OR_DEPARTMENT_OTHER): Payer: Medicare Other | Admitting: Oncology

## 2012-07-31 ENCOUNTER — Telehealth: Payer: Self-pay | Admitting: *Deleted

## 2012-07-31 ENCOUNTER — Other Ambulatory Visit (HOSPITAL_BASED_OUTPATIENT_CLINIC_OR_DEPARTMENT_OTHER): Payer: Medicare Other | Admitting: Lab

## 2012-07-31 ENCOUNTER — Encounter: Payer: Self-pay | Admitting: Oncology

## 2012-07-31 ENCOUNTER — Telehealth: Payer: Self-pay | Admitting: Oncology

## 2012-07-31 VITALS — BP 176/56 | HR 69 | Temp 96.8°F | Resp 18 | Ht 64.5 in | Wt 99.8 lb

## 2012-07-31 DIAGNOSIS — D751 Secondary polycythemia: Secondary | ICD-10-CM

## 2012-07-31 LAB — CBC WITH DIFFERENTIAL/PLATELET
Basophils Absolute: 0 10*3/uL (ref 0.0–0.1)
Eosinophils Absolute: 0.1 10*3/uL (ref 0.0–0.5)
HCT: 36.8 % (ref 34.8–46.6)
HGB: 12.1 g/dL (ref 11.6–15.9)
LYMPH%: 18.2 % (ref 14.0–49.7)
MONO#: 0.6 10*3/uL (ref 0.1–0.9)
NEUT%: 66.6 % (ref 38.4–76.8)
Platelets: 240 10*3/uL (ref 145–400)
WBC: 4.5 10*3/uL (ref 3.9–10.3)
lymph#: 0.8 10*3/uL — ABNORMAL LOW (ref 0.9–3.3)

## 2012-07-31 LAB — COMPREHENSIVE METABOLIC PANEL (CC13)
AST: 20 U/L (ref 5–34)
Albumin: 3.7 g/dL (ref 3.5–5.0)
BUN: 20.3 mg/dL (ref 7.0–26.0)
CO2: 27 mEq/L (ref 22–29)
Calcium: 9.9 mg/dL (ref 8.4–10.4)
Chloride: 96 mEq/L — ABNORMAL LOW (ref 98–109)
Creatinine: 1.1 mg/dL (ref 0.6–1.1)
Glucose: 118 mg/dl (ref 70–140)
Potassium: 4.4 mEq/L (ref 3.5–5.1)

## 2012-07-31 NOTE — Telephone Encounter (Signed)
Per staff message and POF I have scheduled appts.  JMW  

## 2012-07-31 NOTE — Progress Notes (Signed)
This office note has been dictated.  #[id number]

## 2012-07-31 NOTE — Telephone Encounter (Signed)
Gave pt appt for lab, phlebotomy and MD up to October 2014

## 2012-07-31 NOTE — Progress Notes (Signed)
CC:   [name], M.D. [name], M.D.  PROBLEM LIST:  1. Polycythemia vera diagnosed in March 2009. JAK2 mutation was  detected on 04/21/2007. Hemoglobin in April 2009 was 18.4 with  hematocrit of 55.0. Platelet count on July 12, 2007, was 868,000.  The patient has never had a bone marrow exam. She had been  undergoing phlebotomies in the past, approximately once a month,  for hematocrit greater than or equal to 45%. The patient has been  on anagrelide 0.5 mg twice daily, as well as aspirin. She has not  had any thrombotic complications. We will be carrying out a 1-unit  phlebotomy whenever the hematocrit is greater than or equal to 42%.  The patient does require normal saline 300 mL after phlebotomy  because of symptomatic hypotension.   2. 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  Lumpectomy on 01/04/2006 followed by radiation treatments under the  direction of Dr. Antony Blackbird. The patient has not required any  systemic therapy. She does have yearly mammograms.  3. Mitral valve prolapse.  4. Hypertension.  5. Diverticulosis.  6. History of esophageal stricture.  7. History of gastritis.  8. History of pelvic fractures after falling on August 23, 2006.   MEDICATIONS:  Reviewed and recorded. Current Outpatient Prescriptions  Medication Sig Dispense Refill  . amLODipine (NORVASC) 2.5 MG tablet Take 0.5 tablets (1.25 mg total) by mouth daily.  30 tablet  5  . anagrelide (AGRYLIN) 0.5 MG capsule Take 1 capsule (0.5 mg total) by mouth 2 (two) times daily.  60 capsule  2  . aspirin 81 MG tablet Take 81 mg by mouth daily.        . Cetirizine HCl (ZYRTEC PO) Take by mouth as needed.        . Cholecalciferol (VITAMIN D3) 1000 UNITS CAPS Take 2,600 mg by mouth 2 (two) times daily.       Marland Kitchen dicyclomine (BENTYL) 10 MG capsule TAKE  (1)  CAPSULE  TWICE DAILY AS NEEDED.  60 capsule  2  . fluticasone (FLONASE) 50 MCG/ACT nasal spray Place 2 sprays into the nose as  needed.        Marland Kitchen GLUCOSAMINE PO Take 1,500 mg by mouth daily.      . hydrochlorothiazide (HYDRODIURIL) 12.5 MG tablet Take 1 tablet (12.5 mg total) by mouth daily.  30 tablet  5  . lisinopril (PRINIVIL,ZESTRIL) 20 MG tablet Take 1 tablet (20 mg total) by mouth daily.  30 tablet  3  . metoprolol succinate (TOPROL-XL) 50 MG 24 hr tablet Take 1 tablet (50 mg total) by mouth daily. Take with or immediately following a meal.  30 tablet  9  . Multiple Vitamin (MULTIVITAMIN) tablet Take 1 tablet by mouth daily.        . pantoprazole (PROTONIX) 40 MG tablet Take 40 mg by mouth as needed.        . potassium chloride (K-DUR) 10 MEQ tablet Take 1 tablet (10 mEq total) by mouth daily.  30 tablet  6   No current facility-administered medications for this visit.   THERAPEUTIC PROGRAM:  -Anagrelide 0.5 mg twice daily.  -Aspirin 81 mg daily.  -1 unit phlebotomy for hematocrit greater than or equal to 42%. The  patient receives 300 mL of normal saline following each 1-unit  phlebotomy.    SMOKING HISTORY: The patient has never smoked cigarettes.     HISTORY:  [name] is seen today for followup of her polycythemia vera  associated with positive JAK2 mutation.  The patient was last seen by [place] on 04/24/2012.  She required a 1-unit phlebotomy on 05/08/2012, when her hematocrit was 43.8 and another 1 unit phlebotomy on 07/03/2012, when her hematocrit was 42.4.  It will be recalled that she does receive normal saline 300 mL after each phlebotomy.  She has tolerated these procedures well.  There may be some problems with venous access.  On 05/25/2012, the patient's platelet count was 640,000.  We started the patient on hydroxyurea 500 mg daily; however, the patient's platelet count fell over the next few weeks down to 79,000 on 07/10/2012.  At that point, the hydroxyurea was discontinued  The patient is here today with her daughter [name].  The patient is without any new complaints.  She does  complain of some fatigue.  She apparently has falling and has some difficulty getting up.  She has not sustained any injuries.  PHYSICAL EXAMINATION:  The patient is now 79 years old, is somewhat thin but otherwise looks healthy.  Weight is 99.8 pounds, without significant change.  Height 5 feet 4-1/2 inches, body surface area 1.44 sq m.  Blood pressure 176/56.  The patient was informed about her elevated blood pressure.  Other vital signs are normal.  O2 saturation on room air at rest was 98%.  HEENT:  There was no scleral icterus.  Mouth and pharynx are benign.  There was no peripheral adenopathy palpable.  Heart and lungs are normal.  It appears the patient may have scoliosis.  She has seborrheic keratoses on her back.  Abdomen:  Benign, with no organomegaly or masses palpable.  I could not appreciate a palpable spleen.  Apparently I felt the spleen tip on her last visit on 03/24. Extremities:  No peripheral edema or clubbing.  No petechiae or purpura. Breasts were examined on 04/24/2012.  LABORATORY DATA:  Today, white count 4.5, ANC 3.0, hemoglobin 12.1, hematocrit 36.8, platelets 240,000. Platelet count on 07/17/2012 was 79,000.  Chemistries today were notable for a sodium of 131, BUN 20, creatinine 1.1, albumin 3.7, LDH 149.  IMAGING STUDIES: 1. Ultrasound of the abdomen, limited, on 01/21/2006, showed a splenic     volume of 73 cc with maximum ultrasound dimension of 8.8 cm.     Spleen measured 8.8 x 4.1 x 3.9 cm, for a volume of 73 cc.  No     focal lesions were identified. 2. Diagnostic bilateral digital mammogram carried out at [facility] on 01/01/2011 showed no suspicious findings. 3. Mammogram, diagnostic, bilateral on 01/04/2012 showed no     significant abnormalities.  There were post lumpectomy changes     present involving the right breast.   IMPRESSION AND PLAN:  The patient is doing well at the present time with acceptable blood counts.  The  patient's platelet count may be rebounding.  We will check CBC in 2 weeks.  The patient is scheduled for CBCs and possible phlebotomies every month.  We have been using as a threshold for phlebotomy hematocrit of 42%.  If the patient's platelet count were to rise back into the 600k range, I would probably be inclined to increase her anagrelide from 0.5 mg twice a day to 0.5 mg 3 times a day.  Alternatively, one could add hydroxyurea, perhaps 500 mg once or twice a week.  It was clear that 500 mg daily turned out to be too much of a dose and resulted in thrombocytopenia with a platelet  count of 79,000 on 07/10/2012.  The patient will continue with the current treatment program of anagrelide 0.5 mg twice a day.  Her next appointment here will be in about 4 months at which time we will check CBC and chemistries.    ______________________________ Samul Dada, M.D. DSM/MEDQ  D:  07/31/2012  T:  07/31/2012  Job:  [id number]

## 2012-08-08 ENCOUNTER — Other Ambulatory Visit: Payer: Self-pay | Admitting: *Deleted

## 2012-08-08 DIAGNOSIS — D751 Secondary polycythemia: Secondary | ICD-10-CM

## 2012-08-08 MED ORDER — ANAGRELIDE HCL 0.5 MG PO CAPS: 0.5000 mg | ORAL_CAPSULE | Freq: Two times a day (BID) | ORAL | Status: DC

## 2012-08-14 ENCOUNTER — Other Ambulatory Visit (HOSPITAL_BASED_OUTPATIENT_CLINIC_OR_DEPARTMENT_OTHER): Payer: Medicare Other | Admitting: Lab

## 2012-08-14 DIAGNOSIS — D751 Secondary polycythemia: Secondary | ICD-10-CM | POA: Diagnosis not present

## 2012-08-14 LAB — CBC WITH DIFFERENTIAL/PLATELET
BASO%: 0.3 % (ref 0.0–2.0)
Basophils Absolute: 0 10*3/uL (ref 0.0–0.1)
EOS%: 3.1 % (ref 0.0–7.0)
HGB: 12.1 g/dL (ref 11.6–15.9)
MCH: 26.3 pg (ref 25.1–34.0)
MCHC: 32.9 g/dL (ref 31.5–36.0)
MCV: 80 fL (ref 79.5–101.0)
MONO%: 10.8 % (ref 0.0–14.0)
NEUT%: 73.1 % (ref 38.4–76.8)
RDW: 23.8 % — ABNORMAL HIGH (ref 11.2–14.5)
lymph#: 0.7 10*3/uL — ABNORMAL LOW (ref 0.9–3.3)

## 2012-08-23 ENCOUNTER — Other Ambulatory Visit: Payer: Self-pay

## 2012-08-23 DIAGNOSIS — D751 Secondary polycythemia: Secondary | ICD-10-CM

## 2012-08-23 MED ORDER — METOPROLOL SUCCINATE ER 50 MG PO TB24: 50.0000 mg | ORAL_TABLET | Freq: Every day | ORAL | Status: DC

## 2012-08-23 NOTE — Telephone Encounter (Signed)
Refill OK per Burnett Kanaris

## 2012-08-28 ENCOUNTER — Other Ambulatory Visit: Payer: Self-pay | Admitting: *Deleted

## 2012-08-28 MED ORDER — LISINOPRIL 20 MG PO TABS: 20.0000 mg | ORAL_TABLET | Freq: Every day | ORAL | Status: DC

## 2012-08-30 ENCOUNTER — Other Ambulatory Visit (HOSPITAL_BASED_OUTPATIENT_CLINIC_OR_DEPARTMENT_OTHER): Payer: Medicare Other

## 2012-08-30 ENCOUNTER — Ambulatory Visit: Payer: Medicare Other

## 2012-08-30 DIAGNOSIS — D751 Secondary polycythemia: Secondary | ICD-10-CM | POA: Diagnosis not present

## 2012-08-30 LAB — CBC WITH DIFFERENTIAL/PLATELET
BASO%: 0.2 % (ref 0.0–2.0)
EOS%: 7.1 % — ABNORMAL HIGH (ref 0.0–7.0)
HGB: 12.3 g/dL (ref 11.6–15.9)
MCH: 25.8 pg (ref 25.1–34.0)
MCV: 79.2 fL — ABNORMAL LOW (ref 79.5–101.0)
MONO%: 11.1 % (ref 0.0–14.0)
RBC: 4.78 10*6/uL (ref 3.70–5.45)
RDW: 22 % — ABNORMAL HIGH (ref 11.2–14.5)
lymph#: 0.8 10*3/uL — ABNORMAL LOW (ref 0.9–3.3)

## 2012-08-30 NOTE — Progress Notes (Signed)
Treatment parameters not met.  Hct 37.8.  TKF

## 2012-09-06 ENCOUNTER — Other Ambulatory Visit: Payer: Self-pay

## 2012-09-27 ENCOUNTER — Ambulatory Visit: Payer: Medicare Other

## 2012-09-27 ENCOUNTER — Other Ambulatory Visit: Payer: Self-pay | Admitting: Medical Oncology

## 2012-09-27 ENCOUNTER — Other Ambulatory Visit (HOSPITAL_BASED_OUTPATIENT_CLINIC_OR_DEPARTMENT_OTHER): Payer: Medicare Other

## 2012-09-27 DIAGNOSIS — D751 Secondary polycythemia: Secondary | ICD-10-CM

## 2012-09-27 LAB — CBC WITH DIFFERENTIAL/PLATELET
Basophils Absolute: 0 10*3/uL (ref 0.0–0.1)
Eosinophils Absolute: 0.5 10*3/uL (ref 0.0–0.5)
HGB: 12 g/dL (ref 11.6–15.9)
MONO#: 0.9 10*3/uL (ref 0.1–0.9)
MONO%: 10.5 % (ref 0.0–14.0)
NEUT#: 6.3 10*3/uL (ref 1.5–6.5)
RBC: 4.9 10*6/uL (ref 3.70–5.45)
RDW: 16.7 % — ABNORMAL HIGH (ref 11.2–14.5)
WBC: 8.5 10*3/uL (ref 3.9–10.3)
lymph#: 0.8 10*3/uL — ABNORMAL LOW (ref 0.9–3.3)

## 2012-09-27 NOTE — Progress Notes (Signed)
Per parameters, phlebotomy cancelled (HCT 37.9) Pt given copy of lab in lobby and confirmed next appt 9/24.  Pt states "I feel great"

## 2012-10-20 DIAGNOSIS — Z23 Encounter for immunization: Secondary | ICD-10-CM | POA: Diagnosis not present

## 2012-10-25 ENCOUNTER — Other Ambulatory Visit (HOSPITAL_BASED_OUTPATIENT_CLINIC_OR_DEPARTMENT_OTHER): Payer: Medicare Other | Admitting: Lab

## 2012-10-25 ENCOUNTER — Ambulatory Visit: Payer: Medicare Other

## 2012-10-25 DIAGNOSIS — D751 Secondary polycythemia: Secondary | ICD-10-CM | POA: Diagnosis not present

## 2012-10-25 LAB — CBC WITH DIFFERENTIAL/PLATELET
Basophils Absolute: 0 10*3/uL (ref 0.0–0.1)
Eosinophils Absolute: 0.3 10*3/uL (ref 0.0–0.5)
HCT: 37.3 % (ref 34.8–46.6)
HGB: 12 g/dL (ref 11.6–15.9)
LYMPH%: 7.2 % — ABNORMAL LOW (ref 14.0–49.7)
MONO#: 0.6 10*3/uL (ref 0.1–0.9)
NEUT#: 7.1 10*3/uL — ABNORMAL HIGH (ref 1.5–6.5)
Platelets: 497 10*3/uL — ABNORMAL HIGH (ref 145–400)
RBC: 5.05 10*6/uL (ref 3.70–5.45)
WBC: 8.7 10*3/uL (ref 3.9–10.3)

## 2012-10-25 NOTE — Progress Notes (Signed)
Labs reviewed by Dr. Rosie Fate, parameters from Dr. Arline Asp hct >= 45 phlebotomize. Pt hct today 37.3. No phlebotomy per Dr. Rosie Fate.

## 2012-10-30 ENCOUNTER — Other Ambulatory Visit: Payer: Self-pay

## 2012-10-30 MED ORDER — AMLODIPINE BESYLATE 2.5 MG PO TABS: 1.2500 mg | ORAL_TABLET | Freq: Every day | ORAL | Status: DC

## 2012-11-26 ENCOUNTER — Other Ambulatory Visit: Payer: Self-pay | Admitting: Internal Medicine

## 2012-11-26 ENCOUNTER — Other Ambulatory Visit: Payer: Self-pay | Admitting: Cardiovascular Disease

## 2012-11-30 ENCOUNTER — Other Ambulatory Visit (HOSPITAL_BASED_OUTPATIENT_CLINIC_OR_DEPARTMENT_OTHER): Payer: Medicare Other | Admitting: Lab

## 2012-11-30 ENCOUNTER — Telehealth: Payer: Self-pay | Admitting: Internal Medicine

## 2012-11-30 ENCOUNTER — Other Ambulatory Visit: Payer: Self-pay | Admitting: Internal Medicine

## 2012-11-30 ENCOUNTER — Ambulatory Visit (HOSPITAL_BASED_OUTPATIENT_CLINIC_OR_DEPARTMENT_OTHER): Payer: Medicare Other | Admitting: Internal Medicine

## 2012-11-30 VITALS — BP 152/78 | HR 73 | Temp 97.0°F | Resp 18 | Ht 64.5 in | Wt 101.6 lb

## 2012-11-30 DIAGNOSIS — D696 Thrombocytopenia, unspecified: Secondary | ICD-10-CM | POA: Diagnosis not present

## 2012-11-30 DIAGNOSIS — D751 Secondary polycythemia: Secondary | ICD-10-CM

## 2012-11-30 DIAGNOSIS — D45 Polycythemia vera: Secondary | ICD-10-CM

## 2012-11-30 DIAGNOSIS — K5730 Diverticulosis of large intestine without perforation or abscess without bleeding: Secondary | ICD-10-CM

## 2012-11-30 DIAGNOSIS — Z87898 Personal history of other specified conditions: Secondary | ICD-10-CM

## 2012-11-30 DIAGNOSIS — C50919 Malignant neoplasm of unspecified site of unspecified female breast: Secondary | ICD-10-CM

## 2012-11-30 LAB — COMPREHENSIVE METABOLIC PANEL (CC13)
AST: 22 U/L (ref 5–34)
Albumin: 3.6 g/dL (ref 3.5–5.0)
Alkaline Phosphatase: 67 U/L (ref 40–150)
BUN: 16.1 mg/dL (ref 7.0–26.0)
Creatinine: 1.2 mg/dL — ABNORMAL HIGH (ref 0.6–1.1)
Glucose: 92 mg/dl (ref 70–140)
Potassium: 4.3 mEq/L (ref 3.5–5.1)
Sodium: 135 mEq/L — ABNORMAL LOW (ref 136–145)
Total Protein: 6.6 g/dL (ref 6.4–8.3)

## 2012-11-30 LAB — CBC WITH DIFFERENTIAL/PLATELET
Basophils Absolute: 0 10*3/uL (ref 0.0–0.1)
EOS%: 2.5 % (ref 0.0–7.0)
Eosinophils Absolute: 0.2 10*3/uL (ref 0.0–0.5)
HCT: 37.7 % (ref 34.8–46.6)
HGB: 11.9 g/dL (ref 11.6–15.9)
LYMPH%: 8.1 % — ABNORMAL LOW (ref 14.0–49.7)
MCH: 21.8 pg — ABNORMAL LOW (ref 25.1–34.0)
MCV: 69.3 fL — ABNORMAL LOW (ref 79.5–101.0)
MONO%: 8 % (ref 0.0–14.0)
NEUT#: 7.1 10*3/uL — ABNORMAL HIGH (ref 1.5–6.5)
NEUT%: 81 % — ABNORMAL HIGH (ref 38.4–76.8)
Platelets: 533 10*3/uL — ABNORMAL HIGH (ref 145–400)
RDW: 16.7 % — ABNORMAL HIGH (ref 11.2–14.5)

## 2012-11-30 NOTE — Progress Notes (Signed)
[facility] OFFICE PROGRESS NOTE  PANG,RICHARD, MD [address] [place] [address]  DIAGNOSIS: POLYCYTHEMIA - Plan: CBC with Differential in 1 month, CBC with Differential in 2 months, CBC with Differential, CBC with Differential, Comprehensive metabolic panel  DIVERTICULOSIS OF COLON  Malignant neoplasm of breast (female), unspecified site - Plan: CBC with Differential in 1 month, CBC with Differential in 2 months, CBC with Differential, CBC with Differential, Comprehensive metabolic panel  Chief Complaint  Patient presents with  . POLYCYTHEMIA    CURRENT THERAPY: Phlebotomy if hematocrit is greater than or equal to 42%. The patient receives 300 mL of normal saline following each 1-unit phlebotomy. Anagrelide 0.5 mg twice daily and aspirin 81 mg daily.    INTERVAL HISTORY: [name] 79 y.o. female with a history of polycythemia vera (04/21/2007) with positive JAK2 mutation is here for follow-up.  She was last seen by Dr. Arline Asp on 07/31/2012. She was tried on hydroxyurea 500 mg but this was discontinued due to severe thrombocytopenia.  Overall, she is without complaints.  She denies any recent falls.  She has been dealing with aquagenic pruritus since diagnosis but describes it as being mild overall. She denies any recent hospitalizations, emergency room visits or headaches or visual changes. She is scheduled for a repeat mammogram in early December of this year.   MEDICAL HISTORY: Past Medical History  Diagnosis Date  . MVP (mitral valve prolapse)   . Hypertension   . Chest tightness   . Palpitation   . PVC's (premature ventricular contractions)   . Breast cancer     right  . Polycythemia   . Chronic gastritis   . Diverticulosis   . Candida esophagitis   . Arthritis     INTERIM HISTORY: has Malignant Neoplasm of Breast (Female), Unspecified Site; POLYCYTHEMIA; GASTRITIS, CHRONIC; DIVERTICULOSIS OF COLON; ARTHRITIS; ABDOMINAL PAIN,  EPIGASTRIC; HTN (hypertension); and MVP (mitral valve prolapse) on her problem list.    ALLERGIES:  is allergic to sulfur.  MEDICATIONS: has a current medication list which includes the following prescription(s): amlodipine, anagrelide, aspirin, cetirizine hcl, vitamin d3, dicyclomine, fluticasone, glucosamine hcl, hydrochlorothiazide, lisinopril, metoprolol succinate, multivitamin, pantoprazole, and potassium chloride.  SURGICAL HISTORY:  Past Surgical History  Procedure Laterality Date  . Tonsillectomy    . Appendectomy    . Cardiovascular stress test  12/23/2000    EF 70%  . Transthoracic echocardiogram  03/05/2010    EF 55-60%  . Abdominal hysterectomy      ovaries taken  . Breast lumpectomy      right  . Elbow fracture surgery      left  . Cataract extraction      bilateral  . Hematoma evacuation      right   PROBLEM LIST:  1. Polycythemia vera diagnosed in March 2009. JAK2 mutation was detected on 04/21/2007. Hemoglobin in April 2009 was 18.4 with hematocrit of 55.0. Platelet count on July 12, 2007, was 868,000. The patient has never had a bone marrow exam. She had been undergoing phlebotomies in the past, approximately once a month, for hematocrit greater than or equal to 45%. The patient has been on anagrelide 0.5 mg twice daily, as well as aspirin. She has not had any thrombotic complications. We will be carrying out a 1-unit phlebotomy whenever the hematocrit is greater than or equal to 42%. The patient does require normal saline 300 mL after phlebotomy because of symptomatic hypotension.  2. 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  Lumpectomy on 01/04/2006 followed by radiation treatments under the direction of Dr. Antony Blackbird. The patient has not required any systemic therapy. She does have yearly mammograms.  3. Mitral valve prolapse.  4. Hypertension.  5. Diverticulosis.  6. History of esophageal stricture.  7. History of gastritis.  8. History of  pelvic fractures after falling on August 23, 2006.  REVIEW OF SYSTEMS:   Constitutional: Denies fevers, chills or abnormal weight loss Eyes: Denies blurriness of vision Ears, nose, mouth, throat, and face: Denies mucositis or sore throat Respiratory: Denies cough, dyspnea or wheezes Cardiovascular: Denies palpitation, chest discomfort or lower extremity swelling Gastrointestinal:  Denies nausea, heartburn or change in bowel habits Skin: Denies abnormal skin rashes Lymphatics: Denies new lymphadenopathy or easy bruising Neurological:Denies numbness, tingling or new weaknesses Behavioral/Psych: Mood is stable, no new changes  All other systems were reviewed with the patient and are negative.  PHYSICAL EXAMINATION: ECOG PERFORMANCE STATUS: 0 - Asymptomatic  Blood pressure 152/78, pulse 73, temperature 97 F (36.1 C), temperature source Oral, resp. rate 18, height 5' 4.5" (1.638 m), weight 101 lb 9.6 oz (46.085 kg).  GENERAL:alert, no distress and comfortable; she has scoliosis and looks her stated age.  SKIN: skin color, texture, turgor are normal, no rashes but seborrheic keratosis.  EYES: normal, Conjunctiva are pink and non-injected, sclera clear OROPHARYNX:no exudate, no erythema and lips, buccal mucosa, and tongue normal  NECK: supple, thyroid normal size, non-tender, without nodularity LYMPH:  no palpable lymphadenopathy in the cervical, axillary or supraclavicular LUNGS: clear to auscultation and percussion with normal breathing effort HEART: regular rate & rhythm and no murmurs and no lower extremity edema ABDOMEN:abdomen soft, non-tender and normal bowel sounds Musculoskeletal:no cyanosis of digits and no clubbing  NEURO: alert & oriented x 3 with fluent speech, no focal motor/sensory deficits  LABORATORY DATA: No results found for this or any previous visit (from the past 48 hour(s)).  Labs:  Lab Results  Component Value Date   WBC 8.7 11/30/2012   HGB 11.9 11/30/2012    HCT 37.7 11/30/2012   MCV 69.3* 11/30/2012   PLT 533* 11/30/2012   NEUTROABS 7.1* 11/30/2012      Chemistry      Component Value Date/Time   NA 135* 11/30/2012 0915   NA 135 09/03/2011 1154   K 4.3 11/30/2012 0915   K 4.8 09/03/2011 1154   CL 99 05/25/2012 0942   CL 100 09/03/2011 1154   CO2 26 11/30/2012 0915   CO2 24 09/03/2011 1154   BUN 16.1 11/30/2012 0915   BUN 15 09/03/2011 1154   CREATININE 1.2* 11/30/2012 0915   CREATININE 1.11* 09/03/2011 1154      Component Value Date/Time   CALCIUM 9.9 11/30/2012 0915   CALCIUM 9.5 09/03/2011 1154   ALKPHOS 67 11/30/2012 0915   ALKPHOS 65 09/03/2011 1154   AST 22 11/30/2012 0915   AST 18 09/03/2011 1154   ALT 12 11/30/2012 0915   ALT 15 09/03/2011 1154   BILITOT 0.48 11/30/2012 0915   BILITOT 0.3 09/03/2011 1154     GFR Estimated Creatinine Clearance: 25.4 ml/min (by C-G formula based on Cr of 1.2).  CBC:  Recent Labs Lab 11/30/12 0915  WBC 8.7  NEUTROABS 7.1*  HGB 11.9  HCT 37.7  MCV 69.3*  PLT 533*   IMAGING STUDIES:  1. Ultrasound of the abdomen, limited, on 01/21/2006, showed a splenic volume of 73 cc with maximum ultrasound dimension of 8.8 cm. Spleen measured 8.8 x 4.1  x 3.9 cm, for a volume of 73 cc. No focal lesions were identified.  2. Diagnostic bilateral digital mammogram carried out at [facility] on 01/01/2011 showed no suspicious findings.  3. Mammogram, diagnostic, bilateral on 01/04/2012 showed no significant abnormalities. There were post lumpectomy changes present involving the right breast.  ASSESSMENT: Parke [place] 79 y.o. female with a history of POLYCYTHEMIA - Plan: CBC with Differential in 1 month, CBC with Differential in 2 months, CBC with Differential, CBC with Differential, Comprehensive metabolic panel  DIVERTICULOSIS OF COLON  Malignant neoplasm of breast (female), unspecified site - Plan: CBC with Differential in 1 month, CBC with Differential in 2 months, CBC with Differential, CBC with  Differential, Comprehensive metabolic panel   PLAN:  1. Polycythemia vera. --The patient is doing well at the present time with  acceptable blood counts. There is no indication for phlebotomy today.  Her hematocrit is 37.7. Patient will continue her anagrelide 0.5 mg bid, aspirin and phebotomy if hematocrit is greater than 42%.  We will continue to check her CBC monthly.   2. Thrombocytosis secondary to # 1. --If plts are greater than 600K, we will increase her anagrelide to 2 mg tid.    3. DCIS  --Involving the right breast with biopsy on 11/29/2005.  Lumpectomy on 01/04/2006 followed by radiation treatments under the direction of Dr. Antony Blackbird.  She has a mammogram scheduled for 01/2013.    4. Follow-up. --Her next appointment here will be in about 4 months at which time we will check CBC and chemistries. Continue CBC monthly.  All questions were answered. The patient knows to call the clinic with any problems, questions or concerns. We can certainly see the patient much sooner if necessary.  Patient was provided a handout on polycythemia vera.   I spent 15 minutes counseling the patient face to face. The total time spent in the appointment was 25 minutes.    Caitriona Sundquist, MD 12/02/2012 12:40 PM

## 2012-11-30 NOTE — Telephone Encounter (Signed)
gv and printed appt sched and avs for  NOV thru Jan 2015

## 2012-11-30 NOTE — Patient Instructions (Signed)
Polycythemia Vera  Polycythemia Vera is a condition in which the body makes too many red blood cells and there is no known cause. The red blood cells (erythrocytes) are the cells which carry the oxygen in your blood stream to the cells of your body. Because of the increased red blood cells, the blood becomes thicker and does not circulate as well. It would be similar to your car having oil which is too thick so it cannot start and circulate as well. When the blood is too thick it often causes headaches and dizziness. It may also cause blood clots. Even though the blood clots easier, these patients bleed easier. The bleeding is caused because the blood cells which help stop bleeding (platelets) do not function normally. It occurs in all age groups but is more common in the 50 to 70 year age range. TREATMENT  The treatment of polycythemia vera for many years has been blood removal (phlebotomy) which is similar to blood removal in a blood bank, however this blood is not used for donation. Hydroxyurea is used to supplement phlebotomy. Aspirin is commonly given to thin the blood as long as the patient does not have a problem with bleeding. Other drugs are used based on the progression of the disease. Document Released: 10/13/2000 Document Revised: 04/12/2011 Document Reviewed: 04/19/2008 [employer] Patient Information 2014 [employer].  

## 2012-12-11 ENCOUNTER — Other Ambulatory Visit: Payer: Self-pay | Admitting: *Deleted

## 2012-12-11 DIAGNOSIS — D751 Secondary polycythemia: Secondary | ICD-10-CM

## 2012-12-11 MED ORDER — ANAGRELIDE HCL 0.5 MG PO CAPS: 0.5000 mg | ORAL_CAPSULE | Freq: Two times a day (BID) | ORAL | Status: DC

## 2012-12-25 ENCOUNTER — Other Ambulatory Visit (HOSPITAL_BASED_OUTPATIENT_CLINIC_OR_DEPARTMENT_OTHER): Payer: Medicare Other

## 2012-12-25 ENCOUNTER — Ambulatory Visit: Payer: Medicare Other

## 2012-12-25 DIAGNOSIS — D45 Polycythemia vera: Secondary | ICD-10-CM

## 2012-12-25 DIAGNOSIS — D751 Secondary polycythemia: Secondary | ICD-10-CM

## 2012-12-25 LAB — CBC WITH DIFFERENTIAL/PLATELET
Basophils Absolute: 0.1 10*3/uL (ref 0.0–0.1)
Eosinophils Absolute: 0.1 10*3/uL (ref 0.0–0.5)
HCT: 39.1 % (ref 34.8–46.6)
HGB: 12 g/dL (ref 11.6–15.9)
MCH: 20.7 pg — ABNORMAL LOW (ref 25.1–34.0)
MONO#: 0.8 10*3/uL (ref 0.1–0.9)
NEUT#: 8.2 10*3/uL — ABNORMAL HIGH (ref 1.5–6.5)
Platelets: 548 10*3/uL — ABNORMAL HIGH (ref 145–400)
RBC: 5.8 10*6/uL — ABNORMAL HIGH (ref 3.70–5.45)
RDW: 17.1 % — ABNORMAL HIGH (ref 11.2–14.5)
WBC: 9.8 10*3/uL (ref 3.9–10.3)

## 2012-12-25 NOTE — Progress Notes (Signed)
Per parameters, phlebotomy cancelled (Hct = 39). Patient given copy of labs and next appointment on 12/22 confirmed with patient. Angelena Form, RN

## 2012-12-26 DIAGNOSIS — R300 Dysuria: Secondary | ICD-10-CM | POA: Diagnosis not present

## 2013-01-04 DIAGNOSIS — Z1231 Encounter for screening mammogram for malignant neoplasm of breast: Secondary | ICD-10-CM | POA: Diagnosis not present

## 2013-01-22 ENCOUNTER — Ambulatory Visit: Payer: Medicare Other

## 2013-01-22 ENCOUNTER — Other Ambulatory Visit (HOSPITAL_BASED_OUTPATIENT_CLINIC_OR_DEPARTMENT_OTHER): Payer: Medicare Other

## 2013-01-22 DIAGNOSIS — C50919 Malignant neoplasm of unspecified site of unspecified female breast: Secondary | ICD-10-CM

## 2013-01-22 DIAGNOSIS — D751 Secondary polycythemia: Secondary | ICD-10-CM

## 2013-01-22 LAB — CBC WITH DIFFERENTIAL/PLATELET
BASO%: 0.4 % (ref 0.0–2.0)
Eosinophils Absolute: 0.3 10*3/uL (ref 0.0–0.5)
HCT: 40.4 % (ref 34.8–46.6)
LYMPH%: 6.8 % — ABNORMAL LOW (ref 14.0–49.7)
MONO#: 0.9 10*3/uL (ref 0.1–0.9)
MONO%: 7.4 % (ref 0.0–14.0)
NEUT#: 10.3 10*3/uL — ABNORMAL HIGH (ref 1.5–6.5)
Platelets: 573 10*3/uL — ABNORMAL HIGH (ref 145–400)
RBC: 6.05 10*6/uL — ABNORMAL HIGH (ref 3.70–5.45)
RDW: 17.8 % — ABNORMAL HIGH (ref 11.2–14.5)
WBC: 12.5 10*3/uL — ABNORMAL HIGH (ref 3.9–10.3)

## 2013-01-22 NOTE — Progress Notes (Signed)
Hct. 40.4 today. Per MD note, pt. Will not require therapeutic phlebotomy today.  Pt. Is feeling well and glad that she does not require this procedure today. Message left for MD's nurse Zella Ball to notify md. HL

## 2013-02-20 ENCOUNTER — Other Ambulatory Visit: Payer: Self-pay | Admitting: Cardiovascular Disease

## 2013-02-21 ENCOUNTER — Ambulatory Visit: Payer: Medicare Other

## 2013-02-21 ENCOUNTER — Other Ambulatory Visit (HOSPITAL_BASED_OUTPATIENT_CLINIC_OR_DEPARTMENT_OTHER): Payer: Medicare Other

## 2013-02-21 ENCOUNTER — Other Ambulatory Visit: Payer: Self-pay | Admitting: *Deleted

## 2013-02-21 DIAGNOSIS — C50919 Malignant neoplasm of unspecified site of unspecified female breast: Secondary | ICD-10-CM

## 2013-02-21 DIAGNOSIS — D751 Secondary polycythemia: Secondary | ICD-10-CM | POA: Diagnosis not present

## 2013-02-21 LAB — CBC WITH DIFFERENTIAL/PLATELET
BASO%: 0.3 % (ref 0.0–2.0)
Basophils Absolute: 0 10*3/uL (ref 0.0–0.1)
EOS%: 2.8 % (ref 0.0–7.0)
Eosinophils Absolute: 0.3 10*3/uL (ref 0.0–0.5)
HCT: 41.7 % (ref 34.8–46.6)
HEMOGLOBIN: 12.8 g/dL (ref 11.6–15.9)
LYMPH%: 8.2 % — ABNORMAL LOW (ref 14.0–49.7)
MCH: 20.6 pg — AB (ref 25.1–34.0)
MCHC: 30.7 g/dL — AB (ref 31.5–36.0)
MCV: 67.1 fL — AB (ref 79.5–101.0)
MONO#: 0.8 10*3/uL (ref 0.1–0.9)
MONO%: 8.2 % (ref 0.0–14.0)
NEUT#: 7.7 10*3/uL — ABNORMAL HIGH (ref 1.5–6.5)
NEUT%: 80.5 % — AB (ref 38.4–76.8)
Platelets: 472 10*3/uL — ABNORMAL HIGH (ref 145–400)
RBC: 6.21 10*6/uL — ABNORMAL HIGH (ref 3.70–5.45)
RDW: 18.1 % — AB (ref 11.2–14.5)
WBC: 9.6 10*3/uL (ref 3.9–10.3)
lymph#: 0.8 10*3/uL — ABNORMAL LOW (ref 0.9–3.3)

## 2013-02-21 NOTE — Progress Notes (Signed)
Hct 41.7  No need for phlebotomy today.

## 2013-03-21 ENCOUNTER — Ambulatory Visit: Payer: Medicare Other

## 2013-03-21 ENCOUNTER — Other Ambulatory Visit: Payer: Medicare Other

## 2013-03-22 ENCOUNTER — Telehealth: Payer: Self-pay | Admitting: Internal Medicine

## 2013-03-22 NOTE — Telephone Encounter (Signed)
returned call re r/s appt. no able to reach pt.lmonvm asking that pt call back to r/s. not able to lm on cell #.

## 2013-03-22 NOTE — Telephone Encounter (Signed)
pt returned call re r/s appt and was given new appt for 04/04/13

## 2013-04-02 ENCOUNTER — Other Ambulatory Visit: Payer: Self-pay | Admitting: Cardiovascular Disease

## 2013-04-04 ENCOUNTER — Telehealth: Payer: Self-pay | Admitting: Internal Medicine

## 2013-04-04 ENCOUNTER — Ambulatory Visit (HOSPITAL_BASED_OUTPATIENT_CLINIC_OR_DEPARTMENT_OTHER): Payer: Medicare Other | Admitting: Internal Medicine

## 2013-04-04 ENCOUNTER — Other Ambulatory Visit (HOSPITAL_BASED_OUTPATIENT_CLINIC_OR_DEPARTMENT_OTHER): Payer: Medicare Other

## 2013-04-04 ENCOUNTER — Encounter: Payer: Self-pay | Admitting: Internal Medicine

## 2013-04-04 VITALS — BP 168/76 | HR 73 | Temp 97.8°F | Resp 20 | Ht 64.5 in | Wt 94.6 lb

## 2013-04-04 DIAGNOSIS — D473 Essential (hemorrhagic) thrombocythemia: Secondary | ICD-10-CM

## 2013-04-04 DIAGNOSIS — C50919 Malignant neoplasm of unspecified site of unspecified female breast: Secondary | ICD-10-CM

## 2013-04-04 DIAGNOSIS — D45 Polycythemia vera: Secondary | ICD-10-CM

## 2013-04-04 DIAGNOSIS — D751 Secondary polycythemia: Secondary | ICD-10-CM

## 2013-04-04 DIAGNOSIS — Z853 Personal history of malignant neoplasm of breast: Secondary | ICD-10-CM | POA: Diagnosis not present

## 2013-04-04 LAB — CBC WITH DIFFERENTIAL/PLATELET
BASO%: 0.2 % (ref 0.0–2.0)
Basophils Absolute: 0 10*3/uL (ref 0.0–0.1)
EOS%: 2.9 % (ref 0.0–7.0)
Eosinophils Absolute: 0.3 10*3/uL (ref 0.0–0.5)
HCT: 41 % (ref 34.8–46.6)
HGB: 12.8 g/dL (ref 11.6–15.9)
LYMPH%: 7.1 % — ABNORMAL LOW (ref 14.0–49.7)
MCH: 21.1 pg — AB (ref 25.1–34.0)
MCHC: 31.3 g/dL — ABNORMAL LOW (ref 31.5–36.0)
MCV: 67.5 fL — ABNORMAL LOW (ref 79.5–101.0)
MONO#: 0.8 10*3/uL (ref 0.1–0.9)
MONO%: 7.9 % (ref 0.0–14.0)
NEUT#: 8.2 10*3/uL — ABNORMAL HIGH (ref 1.5–6.5)
NEUT%: 81.9 % — ABNORMAL HIGH (ref 38.4–76.8)
Platelets: 584 10*3/uL — ABNORMAL HIGH (ref 145–400)
RBC: 6.08 10*6/uL — AB (ref 3.70–5.45)
RDW: 19.5 % — AB (ref 11.2–14.5)
WBC: 10 10*3/uL (ref 3.9–10.3)
lymph#: 0.7 10*3/uL — ABNORMAL LOW (ref 0.9–3.3)

## 2013-04-04 LAB — COMPREHENSIVE METABOLIC PANEL (CC13)
ALK PHOS: 83 U/L (ref 40–150)
ALT: 14 U/L (ref 0–55)
AST: 19 U/L (ref 5–34)
Albumin: 3.8 g/dL (ref 3.5–5.0)
Anion Gap: 7 mEq/L (ref 3–11)
BUN: 19.7 mg/dL (ref 7.0–26.0)
CO2: 27 mEq/L (ref 22–29)
Calcium: 9.7 mg/dL (ref 8.4–10.4)
Chloride: 101 mEq/L (ref 98–109)
Creatinine: 1.2 mg/dL — ABNORMAL HIGH (ref 0.6–1.1)
Glucose: 93 mg/dl (ref 70–140)
POTASSIUM: 4.5 meq/L (ref 3.5–5.1)
SODIUM: 135 meq/L — AB (ref 136–145)
Total Bilirubin: 0.3 mg/dL (ref 0.20–1.20)
Total Protein: 6.7 g/dL (ref 6.4–8.3)

## 2013-04-04 NOTE — Telephone Encounter (Signed)
gv and printed appt sched and avs for pt for April thru July....sed added tx.

## 2013-04-06 NOTE — Progress Notes (Signed)
Monserrate, MD [address] [place] [address]  DIAGNOSIS: POLYCYTHEMIA - Plan: CBC with Differential, CBC with Differential in 2 months, CBC with Differential in 1 month, CBC with Differential, Comprehensive metabolic panel  Malignant neoplasm of breast (female), unspecified site  Chief Complaint  Patient presents with  . POLYCYTHEMIA    CURRENT THERAPY: Phlebotomy if hematocrit is greater than or equal to 42%. The patient receives 300 mL of normal saline following each 1-unit phlebotomy. Anagrelide 0.5 mg twice daily and aspirin 81 mg daily.    INTERVAL HISTORY: [name] 80 y.o. female with a history of polycythemia vera (04/21/2007) with positive JAK2 mutation is here for follow-up.  She was last seen by me on 11/30/2013. She was tried on hydroxyurea 500 mg but this was discontinued due to severe thrombocytopenia.  Overall, she is without complaints.  She denies any recent falls.  She has been dealing with aquagenic pruritus since diagnosis but describes it as being mild overall. She denies any recent hospitalizations, emergency room visits or headaches or visual changes. She had a  repeat mammogram in early December of this past year without evidence of malignancy.   MEDICAL HISTORY: Past Medical History  Diagnosis Date  . MVP (mitral valve prolapse)   . Hypertension   . Chest tightness   . Palpitation   . PVC's (premature ventricular contractions)   . Breast cancer     right  . Polycythemia   . Chronic gastritis   . Diverticulosis   . Candida esophagitis   . Arthritis     INTERIM HISTORY: has Malignant Neoplasm of Breast (Female), Unspecified Site; POLYCYTHEMIA; GASTRITIS, CHRONIC; DIVERTICULOSIS OF COLON; ARTHRITIS; ABDOMINAL PAIN, EPIGASTRIC; HTN (hypertension); and MVP (mitral valve prolapse) on her problem list.    ALLERGIES:  is allergic to sulfur.  MEDICATIONS: has a current medication  list which includes the following prescription(s): amlodipine, anagrelide, aspirin, cetirizine hcl, vitamin d3, dicyclomine, fluticasone, glucosamine hcl, hydrochlorothiazide, lisinopril, metoprolol succinate, multivitamin, pantoprazole, and potassium chloride.  SURGICAL HISTORY:  Past Surgical History  Procedure Laterality Date  . Tonsillectomy    . Appendectomy    . Cardiovascular stress test  12/23/2000    EF 70%  . Transthoracic echocardiogram  03/05/2010    EF 55-60%  . Abdominal hysterectomy      ovaries taken  . Breast lumpectomy      right  . Elbow fracture surgery      left  . Cataract extraction      bilateral  . Hematoma evacuation      right   PROBLEM LIST:  1. Polycythemia vera diagnosed in March 2009. JAK2 mutation was detected on 04/21/2007. Hemoglobin in April 2009 was 18.4 with hematocrit of 55.0. Platelet count on July 12, 2007, was 868,000. The patient has never had a bone marrow exam. She had been undergoing phlebotomies in the past, approximately once a month, for hematocrit greater than or equal to 45%. The patient has been on anagrelide 0.5 mg twice daily, as well as aspirin. She has not had any thrombotic complications. We will be carrying out a 1-unit phlebotomy whenever the hematocrit is greater than or equal to 42%. The patient does require normal saline 300 mL after phlebotomy because of symptomatic hypotension.  2. 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  Lumpectomy on 01/04/2006 followed by radiation treatments under the direction of Dr. Gery Pray. The patient has not required any systemic therapy.  She does have yearly mammograms.  3. Mitral valve prolapse.  4. Hypertension.  5. Diverticulosis.  6. History of esophageal stricture.  7. History of gastritis.  8. History of pelvic fractures after falling on August 23, 2006.  REVIEW OF SYSTEMS:   Constitutional: Denies fevers, chills or abnormal weight loss Eyes: Denies blurriness of  vision Ears, nose, mouth, throat, and face: Denies mucositis or sore throat Respiratory: Denies cough, dyspnea or wheezes Cardiovascular: Denies palpitation, chest discomfort or lower extremity swelling Gastrointestinal:  Denies nausea, heartburn or change in bowel habits Skin: Denies abnormal skin rashes Lymphatics: Denies new lymphadenopathy or easy bruising Neurological:Denies numbness, tingling or new weaknesses Behavioral/Psych: Mood is stable, no new changes  All other systems were reviewed with the patient and are negative.  PHYSICAL EXAMINATION: ECOG PERFORMANCE STATUS: 0 - Asymptomatic  Blood pressure 168/76, pulse 73, temperature 97.8 F (36.6 C), resp. rate 20, height 5' 4.5" (1.638 m), weight 94 lb 9.6 oz (42.91 kg).  GENERAL:alert, no distress and comfortable; she has scoliosis and looks her stated age.  SKIN: skin color, texture, turgor are normal, no rashes but seborrheic keratosis.  EYES: normal, Conjunctiva are pink and non-injected, sclera clear OROPHARYNX:no exudate, no erythema and lips, buccal mucosa, and tongue normal  NECK: supple, thyroid normal size, non-tender, without nodularity LYMPH:  no palpable lymphadenopathy in the cervical, axillary or supraclavicular LUNGS: clear to auscultation and percussion with normal breathing effort HEART: regular rate & rhythm and no murmurs and no lower extremity edema ABDOMEN:abdomen soft, non-tender and normal bowel sounds Musculoskeletal:no cyanosis of digits and no clubbing  NEURO: alert & oriented x 3 with fluent speech, no focal motor/sensory deficits  LABORATORY DATA: Results for orders placed in visit on 04/04/13 (from the past 48 hour(s))  CBC WITH DIFFERENTIAL     Status: Abnormal   Collection Time    04/04/13 11:16 AM      Result Value Ref Range   WBC 10.0  3.9 - 10.3 10e3/uL   NEUT# 8.2 (*) 1.5 - 6.5 10e3/uL   HGB 12.8  11.6 - 15.9 g/dL   HCT 41.0  34.8 - 46.6 %   Platelets 584 (*) 145 - 400 10e3/uL   MCV  67.5 (*) 79.5 - 101.0 fL   MCH 21.1 (*) 25.1 - 34.0 pg   MCHC 31.3 (*) 31.5 - 36.0 g/dL   RBC 6.08 (*) 3.70 - 5.45 10e6/uL   RDW 19.5 (*) 11.2 - 14.5 %   lymph# 0.7 (*) 0.9 - 3.3 10e3/uL   MONO# 0.8  0.1 - 0.9 10e3/uL   Eosinophils Absolute 0.3  0.0 - 0.5 10e3/uL   Basophils Absolute 0.0  0.0 - 0.1 10e3/uL   NEUT% 81.9 (*) 38.4 - 76.8 %   LYMPH% 7.1 (*) 14.0 - 49.7 %   MONO% 7.9  0.0 - 14.0 %   EOS% 2.9  0.0 - 7.0 %   BASO% 0.2  0.0 - 2.0 %  COMPREHENSIVE METABOLIC PANEL (XT06)     Status: Abnormal   Collection Time    04/04/13 11:16 AM      Result Value Ref Range   Sodium 135 (*) 136 - 145 mEq/L   Potassium 4.5  3.5 - 5.1 mEq/L   Chloride 101  98 - 109 mEq/L   CO2 27  22 - 29 mEq/L   Glucose 93  70 - 140 mg/dl   BUN 19.7  7.0 - 26.0 mg/dL   Creatinine 1.2 (*) 0.6 - 1.1 mg/dL  Total Bilirubin 0.30  0.20 - 1.20 mg/dL   Alkaline Phosphatase 83  40 - 150 U/L   AST 19  5 - 34 U/L   ALT 14  0 - 55 U/L   Total Protein 6.7  6.4 - 8.3 g/dL   Albumin 3.8  3.5 - 5.0 g/dL   Calcium 9.7  8.4 - 10.4 mg/dL   Anion Gap 7  3 - 11 mEq/L    Labs:  Lab Results  Component Value Date   WBC 10.0 04/04/2013   HGB 12.8 04/04/2013   HCT 41.0 04/04/2013   MCV 67.5* 04/04/2013   PLT 584* 04/04/2013   NEUTROABS 8.2* 04/04/2013      Chemistry      Component Value Date/Time   NA 135* 04/04/2013 1116   NA 135 09/03/2011 1154   K 4.5 04/04/2013 1116   K 4.8 09/03/2011 1154   CL 99 05/25/2012 0942   CL 100 09/03/2011 1154   CO2 27 04/04/2013 1116   CO2 24 09/03/2011 1154   BUN 19.7 04/04/2013 1116   BUN 15 09/03/2011 1154   CREATININE 1.2* 04/04/2013 1116   CREATININE 1.11* 09/03/2011 1154      Component Value Date/Time   CALCIUM 9.7 04/04/2013 1116   CALCIUM 9.5 09/03/2011 1154   ALKPHOS 83 04/04/2013 1116   ALKPHOS 65 09/03/2011 1154   AST 19 04/04/2013 1116   AST 18 09/03/2011 1154   ALT 14 04/04/2013 1116   ALT 15 09/03/2011 1154   BILITOT 0.30 04/04/2013 1116   BILITOT 0.3 09/03/2011 1154     GFR Estimated Creatinine  Clearance: 23.6 ml/min (by C-G formula based on Cr of 1.2).  CBC:  Recent Labs Lab 04/04/13 1116  WBC 10.0  NEUTROABS 8.2*  HGB 12.8  HCT 41.0  MCV 67.5*  PLT 584*   IMAGING STUDIES:  1. Ultrasound of the abdomen, limited, on 01/21/2006, showed a splenic volume of 73 cc with maximum ultrasound dimension of 8.8 cm. Spleen measured 8.8 x 4.1 x 3.9 cm, for a volume of 73 cc. No focal lesions were identified.  2. Diagnostic bilateral digital mammogram carried out at [facility] on 01/01/2011 showed no suspicious findings.  3. Mammogram, diagnostic, bilateral on 01/04/2012 showed no significant abnormalities. There were post lumpectomy changes present involving the right breast.  ASSESSMENT: [name] 80 y.o. female with a history of POLYCYTHEMIA - Plan: CBC with Differential, CBC with Differential in 2 months, CBC with Differential in 1 month, CBC with Differential, Comprehensive metabolic panel  Malignant neoplasm of breast (female), unspecified site   PLAN:  1. Polycythemia vera. --The patient is doing well at the present time with  acceptable blood counts. There is no indication for phlebotomy today.  Her hematocrit is 41. Patient will continue her anagrelide 0.5 mg bid, aspirin and phebotomy if hematocrit is greater than 42%.  We will continue to check her CBC monthly.   2. Thrombocytosis secondary to # 1. --If plts are greater than 600K, we will increase her anagrelide to 2 mg tid.  They are 584K.  3. DCIS  --Involving the right breast with biopsy on 11/29/2005.  Lumpectomy on 01/04/2006 followed by radiation treatments under the direction of Dr. Gery Pray.  Her mammogram on 01/03/2013 was negative.    4. Follow-up. --Her next appointment here will be in about 4 months at which time we will check CBC and chemistries. Continue CBC monthly.  All questions were answered. The patient knows to call  the clinic with any problems, questions or concerns. We can  certainly see the patient much sooner if necessary.  Patient was provided a handout on polycythemia vera.   I spent 15 minutes counseling the patient face to face. The total time spent in the appointment was 25 minutes.    Kelly Cullom, MD 04/06/2013 6:08 AM

## 2013-04-09 ENCOUNTER — Other Ambulatory Visit: Payer: Self-pay | Admitting: Internal Medicine

## 2013-04-20 ENCOUNTER — Telehealth: Payer: Self-pay | Admitting: Dietician

## 2013-04-20 NOTE — Telephone Encounter (Signed)
Brief Outpatient Oncology Nutrition Note  Patient has been identified to be at risk on malnutrition screen.  Wt Readings from Last 10 Encounters:  04/04/13 94 lb 9.6 oz (42.91 kg)  11/30/12 101 lb 9.6 oz (46.085 kg)  07/31/12 99 lb 12.8 oz (45.269 kg)  07/05/12 99 lb 12.8 oz (45.269 kg)  04/24/12 100 lb 6.4 oz (45.541 kg)  01/24/12 99 lb 14.4 oz (45.314 kg)  10/15/11 100 lb (45.36 kg)  09/22/11 102 lb 3.2 oz (46.358 kg)  08/31/11 100 lb 2 oz (45.416 kg)  06/17/11 100 lb (45.36 kg)    Dx:  Breast Cancer  Called patient due to weight loss.  Patient states that she is eating well and does not know why she is losing weight.  Drinks Boost at times.  Will mail tips to increase calories and protein along with coupons for Boost and Contact information for the [place] [facility].  Antonieta Iba, RD, LDN

## 2013-04-24 ENCOUNTER — Other Ambulatory Visit: Payer: Self-pay | Admitting: Cardiovascular Disease

## 2013-05-02 ENCOUNTER — Other Ambulatory Visit (HOSPITAL_BASED_OUTPATIENT_CLINIC_OR_DEPARTMENT_OTHER): Payer: Medicare Other

## 2013-05-02 ENCOUNTER — Ambulatory Visit (HOSPITAL_BASED_OUTPATIENT_CLINIC_OR_DEPARTMENT_OTHER): Payer: Medicare Other

## 2013-05-02 VITALS — BP 154/86 | HR 67 | Temp 96.9°F | Resp 20

## 2013-05-02 DIAGNOSIS — D751 Secondary polycythemia: Secondary | ICD-10-CM

## 2013-05-02 DIAGNOSIS — D45 Polycythemia vera: Secondary | ICD-10-CM

## 2013-05-02 LAB — CBC WITH DIFFERENTIAL/PLATELET
BASO%: 0.1 % (ref 0.0–2.0)
BASOS ABS: 0 10*3/uL (ref 0.0–0.1)
EOS ABS: 0.2 10*3/uL (ref 0.0–0.5)
EOS%: 2.5 % (ref 0.0–7.0)
HEMATOCRIT: 43.8 % (ref 34.8–46.6)
HEMOGLOBIN: 13.4 g/dL (ref 11.6–15.9)
LYMPH#: 0.9 10*3/uL (ref 0.9–3.3)
LYMPH%: 8.6 % — ABNORMAL LOW (ref 14.0–49.7)
MCH: 21.1 pg — ABNORMAL LOW (ref 25.1–34.0)
MCHC: 30.6 g/dL — AB (ref 31.5–36.0)
MCV: 69 fL — ABNORMAL LOW (ref 79.5–101.0)
MONO#: 0.9 10*3/uL (ref 0.1–0.9)
MONO%: 9.3 % (ref 0.0–14.0)
NEUT%: 79.5 % — AB (ref 38.4–76.8)
NEUTROS ABS: 7.9 10*3/uL — AB (ref 1.5–6.5)
Platelets: 461 10*3/uL — ABNORMAL HIGH (ref 145–400)
RBC: 6.35 10*6/uL — ABNORMAL HIGH (ref 3.70–5.45)
RDW: 18.7 % — ABNORMAL HIGH (ref 11.2–14.5)
WBC: 9.9 10*3/uL (ref 3.9–10.3)

## 2013-05-02 MED ORDER — SODIUM CHLORIDE 0.9 % IV SOLN
Freq: Once | INTRAVENOUS | Status: AC
  Administered 2013-05-02: 12:00:00 via INTRAVENOUS

## 2013-05-02 NOTE — Patient Instructions (Signed)
Therapeutic Phlebotomy Therapeutic phlebotomy is the controlled removal of blood from your body for the purpose of treating a medical condition. It is similar to donating blood. Usually, about a pint (470 mL) of blood is removed. The average adult has 9 to 12 pints (4.3 to 5.7 L) of blood. Therapeutic phlebotomy may be used to treat the following medical conditions:  Hemochromatosis. This is a condition in which there is too much iron in the blood.  Polycythemia vera. This is a condition in which there are too many red cells in the blood.  Porphyria cutanea tarda. This is a disease usually passed from one generation to the next (inherited). It is a condition in which an important part of hemoglobin is not made properly. This results in the build up of abnormal amounts of porphyrins in the body.  Sickle cell disease. This is an inherited disease. It is a condition in which the red blood cells form an abnormal crescent shape rather than a round shape. LET YOUR CAREGIVER KNOW ABOUT:  Allergies.  Medicines taken including herbs, eyedrops, over-the-counter medicines, and creams.  Use of steroids (by mouth or creams).  Previous problems with anesthetics or numbing medicine.  History of blood clots.  History of bleeding or blood problems.  Previous surgery.  Possibility of pregnancy, if this applies. RISKS AND COMPLICATIONS This is a simple and safe procedure. Problems are unlikely. However, problems can occur and may include:  Nausea or lightheadedness.  Low blood pressure.  Soreness, bleeding, swelling, or bruising at the needle insertion site.  Infection. BEFORE THE PROCEDURE  This is a procedure that can be done as an outpatient. Confirm the time that you need to arrive for your procedure. Confirm whether there is a need to fast or withhold any medications. It is helpful to wear clothing with sleeves that can be raised above the elbow. A blood sample may be done to determine the  amount of red blood cells or iron in your blood. Plan ahead of time to have someone drive you home after the procedure. PROCEDURE The entire procedure from preparation through recovery takes about 1 hour. The actual collection takes about 10 to 15 minutes.  A needle will be inserted into your vein.  Tubing and a collection bag will be attached to that needle.  Blood will flow through the needle and tubing into the collection bag.  You may be asked to open and close your hand slowly and continuously during the entire collection.  Once the specified amount of blood has been removed from your body, the collection bag and tubing will be clamped.  The needle will be removed.  Pressure will be held on the site of the needle insertion to stop the bleeding. Then a bandage will be placed over the needle insertion site. AFTER THE PROCEDURE  Your recovery will be assessed and monitored. If there are no problems, as an outpatient, you should be able to go home shortly after the procedure.  Document Released: 06/22/2010 Document Revised: 04/12/2011 Document Reviewed: 06/22/2010 [employer] Patient Information 2014 [employer].  

## 2013-05-02 NOTE — Progress Notes (Signed)
No orders.  Dr. Juliann Mule out of office today.  KK requested verbal orders - with a patient - per [facility] office note and KK phlebotomy and saline orders entered.

## 2013-05-02 NOTE — Progress Notes (Signed)
[id number] patient phlebotomized 1 unit/430cc per orders for Hct greater than or equal to 42%. HCT today 43.8; patient tolerated procedure which lasted 30 minutes. IVF infusing per orders, patient drinking water, refused food. To be monitored per policy.

## 2013-05-08 ENCOUNTER — Other Ambulatory Visit: Payer: Medicare Other

## 2013-05-11 DIAGNOSIS — H4310 Vitreous hemorrhage, unspecified eye: Secondary | ICD-10-CM | POA: Diagnosis not present

## 2013-05-11 DIAGNOSIS — H47329 Drusen of optic disc, unspecified eye: Secondary | ICD-10-CM | POA: Diagnosis not present

## 2013-05-11 DIAGNOSIS — H04129 Dry eye syndrome of unspecified lacrimal gland: Secondary | ICD-10-CM | POA: Diagnosis not present

## 2013-05-11 DIAGNOSIS — H5310 Unspecified subjective visual disturbances: Secondary | ICD-10-CM | POA: Diagnosis not present

## 2013-05-16 ENCOUNTER — Other Ambulatory Visit (INDEPENDENT_AMBULATORY_CARE_PROVIDER_SITE_OTHER): Payer: Medicare Other

## 2013-05-16 ENCOUNTER — Encounter: Payer: Self-pay | Admitting: Internal Medicine

## 2013-05-16 ENCOUNTER — Ambulatory Visit (INDEPENDENT_AMBULATORY_CARE_PROVIDER_SITE_OTHER): Payer: Medicare Other | Admitting: Internal Medicine

## 2013-05-16 VITALS — BP 120/68 | HR 72 | Ht 64.5 in | Wt 102.0 lb

## 2013-05-16 DIAGNOSIS — R197 Diarrhea, unspecified: Secondary | ICD-10-CM

## 2013-05-16 DIAGNOSIS — R634 Abnormal weight loss: Secondary | ICD-10-CM | POA: Diagnosis not present

## 2013-05-16 LAB — COMPREHENSIVE METABOLIC PANEL
ALT: 19 U/L (ref 0–35)
AST: 21 U/L (ref 0–37)
Albumin: 3.9 g/dL (ref 3.5–5.2)
Alkaline Phosphatase: 62 U/L (ref 39–117)
BILIRUBIN TOTAL: 0.8 mg/dL (ref 0.3–1.2)
BUN: 20 mg/dL (ref 6–23)
CALCIUM: 9.7 mg/dL (ref 8.4–10.5)
CHLORIDE: 96 meq/L (ref 96–112)
CO2: 27 mEq/L (ref 19–32)
Creatinine, Ser: 1.2 mg/dL (ref 0.4–1.2)
GFR: 45.84 mL/min — ABNORMAL LOW (ref >60.00)
Glucose, Bld: 100 mg/dL — ABNORMAL HIGH (ref 70–99)
POTASSIUM: 4.4 meq/L (ref 3.5–5.1)
Sodium: 130 mEq/L — ABNORMAL LOW (ref 135–145)
TOTAL PROTEIN: 6.8 g/dL (ref 6.0–8.3)

## 2013-05-16 LAB — SEDIMENTATION RATE: Sed Rate: 5 mm/hr (ref 0–22)

## 2013-05-16 LAB — TSH: TSH: 3.17 u[IU]/mL (ref 0.35–5.50)

## 2013-05-16 MED ORDER — METRONIDAZOLE 250 MG PO TABS: 250.0000 mg | ORAL_TABLET | Freq: Three times a day (TID) | ORAL | Status: DC

## 2013-05-16 MED ORDER — LOPERAMIDE HCL 1 MG/5ML PO LIQD: 1.0000 mg | Freq: Every day | ORAL | Status: DC

## 2013-05-16 NOTE — Progress Notes (Signed)
[name] [birth year] [id number]  Note: This dictation was prepared with Dragon digital system. Any transcriptional errors that result from this procedure are unintentional.   History of Present Illness: This is a 80 year old white female with several months history of diarrhea. She denies constipation. Her stools are watery or very soft. There is no blood. There's crampy lower abdominal pain and urgency. She has had several accidents. She has a history of polycythemia, positive JAK 2 mutation, followed by hematology. We have seen her in the past for colorectal screening and for history of esophageal stricture. Upper endoscopy in September 2013 and prior to that in 2009 showed esophageal stricture dilated with Savary dilators to 16 mm she had a 3 cm hiatal hernia. Her colonoscopy in February 2007 showed tortuous colon with diverticulosis. It was difficult exam because of redundant colon with spasm. She denies taking any antibiotics recently. She has not traveled.    Past Medical History  Diagnosis Date  . MVP (mitral valve prolapse)   . Hypertension   . Chest tightness   . Palpitation   . PVC's (premature ventricular contractions)   . Breast cancer     right  . Polycythemia   . Chronic gastritis   . Diverticulosis   . Candida esophagitis   . Arthritis     Past Surgical History  Procedure Laterality Date  . Tonsillectomy    . Appendectomy    . Cardiovascular stress test  12/23/2000    EF 70%  . Transthoracic echocardiogram  03/05/2010    EF 55-60%  . Abdominal hysterectomy      ovaries taken  . Breast lumpectomy      right  . Elbow fracture surgery      left  . Cataract extraction      bilateral  . Hematoma evacuation      right    Allergies  Allergen Reactions  . Sulfur     Pt doesn't remember reaction.    Family history and social history have been reviewed.  Review of Systems: Denies heartburn dysphagia. Positive for crampy abdominal pain and weight  loss  The remainder of the 10 point ROS is negative except as outlined in the H&P  Physical Exam: General Appearance very thin pleasant and oriented, in no distress Eyes  Non icteric  HEENT  Non traumatic, normocephalic  Mouth No lesion, tongue papillated, no cheilosis Neck Supple without adenopathy, thyroid not enlarged, no carotid bruits, no JVD Lungs Clear to auscultation bilaterally COR Normal S1, normal S2, regular rhythm, no murmur, quiet precordium Abdomen hyperactive bowel sounds. Soft with diffuse tenderness in all quadrants. No palpable mass. Increased tympany Rectal soft formed to Hemoccult negative stool Extremities  No pedal edema Skin No lesions Neurological Alert and oriented x 3 Psychological Normal mood and affect  Assessment and Plan:   80 year old white female with subacute diarrhea. Possibly  Infectious or bacteria overgrowth. We need to rule out  microscopic colitis. She has rather tortuous spastic colon with diverticulosis which could be causing some of her symptoms. We will obtain stool for pathogens as well as lactoferrin and C. difficile toxin. We will check her TSH, metabolic panel, sprue panel  and sedimentation rate. We will give empirical trial of Flagyl 250 mg 3 times a day for one week and liquid Imodium 1 teaspoon, 1 mg daily.  I would like to see her in 4 weeks    [place] Dragon 05/16/2013

## 2013-05-16 NOTE — Patient Instructions (Addendum)
We have sent the following medications to your pharmacy for you to pick up at your convenience: Flagyl  Imodium  Your physician has requested that you go to the basement for the following lab work before leaving today: GI Pathogen Panel, Lactoferrin, Sed Rate, TSH, Celiac Panel, CMET  Please follow up with Dr Olevia Perches in 4 weeks. Dr Minna Antis, Dr Fawn Kirk

## 2013-05-17 LAB — CELIAC PANEL 10
Endomysial Screen: NEGATIVE
Gliadin IgA: 4.2 U/mL (ref <20)
Gliadin IgG: 3.8 U/mL (ref <20)
IgA: 236 mg/dL (ref 69–380)
TISSUE TRANSGLUT AB: 5.6 U/mL (ref <20)
Tissue Transglutaminase Ab, IgA: 2.8 U/mL (ref <20)

## 2013-05-17 LAB — GASTROINTESTINAL PATHOGEN PANEL PCR
C. difficile Tox A/B, PCR: NEGATIVE
Campylobacter, PCR: NEGATIVE
Cryptosporidium, PCR: NEGATIVE
E coli (ETEC) LT/ST PCR: NEGATIVE
E coli (STEC) stx1/stx2, PCR: NEGATIVE
E coli 0157, PCR: NEGATIVE
GIARDIA LAMBLIA, PCR: NEGATIVE
NOROVIRUS, PCR: NEGATIVE
ROTAVIRUS, PCR: NEGATIVE
SALMONELLA, PCR: NEGATIVE
SHIGELLA, PCR: NEGATIVE

## 2013-05-24 DIAGNOSIS — I10 Essential (primary) hypertension: Secondary | ICD-10-CM | POA: Diagnosis not present

## 2013-06-04 ENCOUNTER — Other Ambulatory Visit: Payer: Self-pay | Admitting: Cardiovascular Disease

## 2013-06-06 ENCOUNTER — Encounter: Payer: Self-pay | Admitting: Emergency Medicine

## 2013-06-06 ENCOUNTER — Other Ambulatory Visit (HOSPITAL_BASED_OUTPATIENT_CLINIC_OR_DEPARTMENT_OTHER): Payer: Medicare Other

## 2013-06-06 DIAGNOSIS — D693 Immune thrombocytopenic purpura: Secondary | ICD-10-CM

## 2013-06-06 DIAGNOSIS — D751 Secondary polycythemia: Secondary | ICD-10-CM

## 2013-06-06 DIAGNOSIS — Z853 Personal history of malignant neoplasm of breast: Secondary | ICD-10-CM

## 2013-06-06 DIAGNOSIS — D45 Polycythemia vera: Secondary | ICD-10-CM

## 2013-06-06 LAB — CBC WITH DIFFERENTIAL/PLATELET
BASO%: 0.3 % (ref 0.0–2.0)
BASOS ABS: 0 10*3/uL (ref 0.0–0.1)
EOS ABS: 0.3 10*3/uL (ref 0.0–0.5)
EOS%: 3 % (ref 0.0–7.0)
HCT: 37.1 % (ref 34.8–46.6)
HGB: 11.7 g/dL (ref 11.6–15.9)
LYMPH%: 8 % — AB (ref 14.0–49.7)
MCH: 21.3 pg — ABNORMAL LOW (ref 25.1–34.0)
MCHC: 31.6 g/dL (ref 31.5–36.0)
MCV: 67.6 fL — ABNORMAL LOW (ref 79.5–101.0)
MONO#: 0.8 10*3/uL (ref 0.1–0.9)
MONO%: 8.9 % (ref 0.0–14.0)
NEUT%: 79.8 % — ABNORMAL HIGH (ref 38.4–76.8)
NEUTROS ABS: 7.2 10*3/uL — AB (ref 1.5–6.5)
Platelets: 516 10*3/uL — ABNORMAL HIGH (ref 145–400)
RBC: 5.49 10*6/uL — AB (ref 3.70–5.45)
RDW: 17 % — ABNORMAL HIGH (ref 11.2–14.5)
WBC: 9 10*3/uL (ref 3.9–10.3)
lymph#: 0.7 10*3/uL — ABNORMAL LOW (ref 0.9–3.3)

## 2013-06-06 NOTE — Progress Notes (Signed)
Will hold today's phlebotomy for HCT less than 42 per office note. Today's HCT 37.1. Gave patient a copy of labs and a calendar for June. Instructed patient to call for any questions or concerns. Patient verbalized understanding.

## 2013-06-11 DIAGNOSIS — I10 Essential (primary) hypertension: Secondary | ICD-10-CM | POA: Diagnosis not present

## 2013-06-11 DIAGNOSIS — K219 Gastro-esophageal reflux disease without esophagitis: Secondary | ICD-10-CM | POA: Diagnosis not present

## 2013-06-11 DIAGNOSIS — R197 Diarrhea, unspecified: Secondary | ICD-10-CM | POA: Diagnosis not present

## 2013-06-11 DIAGNOSIS — R350 Frequency of micturition: Secondary | ICD-10-CM | POA: Diagnosis not present

## 2013-06-11 DIAGNOSIS — Z0000 Encounter for general adult medical examination without abnormal findings: Secondary | ICD-10-CM | POA: Diagnosis not present

## 2013-06-15 ENCOUNTER — Other Ambulatory Visit: Payer: Self-pay | Admitting: Internal Medicine

## 2013-07-03 ENCOUNTER — Other Ambulatory Visit: Payer: Self-pay | Admitting: Cardiovascular Disease

## 2013-07-04 ENCOUNTER — Other Ambulatory Visit (HOSPITAL_BASED_OUTPATIENT_CLINIC_OR_DEPARTMENT_OTHER): Payer: Medicare Other

## 2013-07-04 ENCOUNTER — Ambulatory Visit: Payer: Medicare Other

## 2013-07-04 DIAGNOSIS — Z853 Personal history of malignant neoplasm of breast: Secondary | ICD-10-CM | POA: Diagnosis not present

## 2013-07-04 DIAGNOSIS — D473 Essential (hemorrhagic) thrombocythemia: Secondary | ICD-10-CM | POA: Diagnosis not present

## 2013-07-04 DIAGNOSIS — D45 Polycythemia vera: Secondary | ICD-10-CM | POA: Diagnosis not present

## 2013-07-04 DIAGNOSIS — D751 Secondary polycythemia: Secondary | ICD-10-CM

## 2013-07-04 LAB — CBC WITH DIFFERENTIAL/PLATELET
BASO%: 0.5 % (ref 0.0–2.0)
Basophils Absolute: 0.1 10*3/uL (ref 0.0–0.1)
EOS%: 5.2 % (ref 0.0–7.0)
Eosinophils Absolute: 0.6 10*3/uL — ABNORMAL HIGH (ref 0.0–0.5)
HEMATOCRIT: 40.2 % (ref 34.8–46.6)
HGB: 12.3 g/dL (ref 11.6–15.9)
LYMPH%: 5.8 % — AB (ref 14.0–49.7)
MCH: 20.3 pg — AB (ref 25.1–34.0)
MCHC: 30.6 g/dL — AB (ref 31.5–36.0)
MCV: 66.4 fL — ABNORMAL LOW (ref 79.5–101.0)
MONO#: 0.9 10*3/uL (ref 0.1–0.9)
MONO%: 8.1 % (ref 0.0–14.0)
NEUT#: 8.6 10*3/uL — ABNORMAL HIGH (ref 1.5–6.5)
NEUT%: 80.4 % — ABNORMAL HIGH (ref 38.4–76.8)
Platelets: 545 10*3/uL — ABNORMAL HIGH (ref 145–400)
RBC: 6.06 10*6/uL — AB (ref 3.70–5.45)
RDW: 16.9 % — ABNORMAL HIGH (ref 11.2–14.5)
WBC: 10.7 10*3/uL — AB (ref 3.9–10.3)
lymph#: 0.6 10*3/uL — ABNORMAL LOW (ref 0.9–3.3)

## 2013-07-04 NOTE — Progress Notes (Signed)
Patient's Hct today is 40.2. Per office note, no phlebotomy unless greater than 42. Provided patient with calendar for June/July. Patient has no questions at this time.

## 2013-07-09 ENCOUNTER — Ambulatory Visit (INDEPENDENT_AMBULATORY_CARE_PROVIDER_SITE_OTHER): Payer: Medicare Other | Admitting: Cardiovascular Disease

## 2013-07-09 ENCOUNTER — Encounter: Payer: Self-pay | Admitting: Cardiovascular Disease

## 2013-07-09 VITALS — BP 130/62 | HR 87 | Ht 64.0 in | Wt 100.0 lb

## 2013-07-09 DIAGNOSIS — I10 Essential (primary) hypertension: Secondary | ICD-10-CM | POA: Diagnosis not present

## 2013-07-09 DIAGNOSIS — I059 Rheumatic mitral valve disease, unspecified: Secondary | ICD-10-CM | POA: Diagnosis not present

## 2013-07-09 DIAGNOSIS — I341 Nonrheumatic mitral (valve) prolapse: Secondary | ICD-10-CM

## 2013-07-09 NOTE — Patient Instructions (Addendum)
Your physician wants you to follow-up in: 6 months with DR. Naher You will receive a reminder letter in the mail two months in advance. If you don't receive a letter, please call our office to schedule the follow-up appointment.  Your physician recommends that you continue on your current medications as directed. Please refer to the Current Medication list given to you today.  Fasting labs in 6 months : LIPID,LIVER, BMET

## 2013-07-09 NOTE — Progress Notes (Signed)
[name] Date of Birth  12-30-1926 [place] [address]    [address] [place], [place]  [address]    [place],   [address] [phone]  Fax  [phone]  [phone]  Fax [phone]   History of Present Illness:  Problem list: 1. Mitral valve prolapse 2. Breast cancer 3. Polycythemia 4. Hypertension  [name] is an 80 year old female with a history of mitral valve prolapse, hypertension, and polycythemia. She has phlebotomy  about once a month for her polycythemia. Her blood pressure typically is little bit elevated and then goes down quickly after her phlebotomy. She's feeling quite well. She's exercising regularly.  July 05, 2012:  No CP.  She has mild  Dyspnea.  She is still getting phlebotomy regularly.  She has some mild dyspnea when she first lies down but it typically resolves and she doe not have to sleep on any pillows.  She is able to do her normal activities without problems.   07/09/2013:  [name] is doing ok.  Not walking as much as she used to.  Stays active. h Has lots of cramps in her feet.     Current Outpatient Prescriptions on File Prior to Visit  Medication Sig Dispense Refill  . amLODipine (NORVASC) 2.5 MG tablet Take 0.5 tablets (1.25 mg total) by mouth daily.  30 tablet  5  . anagrelide (AGRYLIN) 0.5 MG capsule TAKE (1) CAPSULE TWICE DAILY.  60 capsule  0  . aspirin 81 MG tablet Take 81 mg by mouth daily.        . Cetirizine HCl (ZYRTEC PO) Take by mouth as needed.        . Cholecalciferol (VITAMIN D3) 1000 UNITS CAPS Take 2,600 mg by mouth 2 (two) times daily.       Marland Kitchen dicyclomine (BENTYL) 10 MG capsule TAKE  (1)  CAPSULE  TWICE DAILY AS NEEDED.  60 capsule  3  . fluticasone (FLONASE) 50 MCG/ACT nasal spray Place 2 sprays into the nose as needed.        Marland Kitchen GLUCOSAMINE PO Take 1,500 mg by mouth daily.      . hydrochlorothiazide (MICROZIDE) 12.5 MG capsule TAKE (1) CAPSULE DAILY.  30  capsule  0  . lisinopril (PRINIVIL,ZESTRIL) 20 MG tablet TAKE 1 TABLET EACH DAY.  30 tablet  0  . loperamide (IMODIUM) 1 MG/5ML solution Take 5 mLs (1 mg total) by mouth daily.  150 mL  0  . metoprolol succinate (TOPROL-XL) 50 MG 24 hr tablet Take 1 tablet (50 mg total) by mouth daily. Take with or immediately following a meal.  90 tablet  3  . metroNIDAZOLE (FLAGYL) 250 MG tablet Take 1 tablet (250 mg total) by mouth 3 (three) times daily.  21 tablet  0  . Multiple Vitamin (MULTIVITAMIN) tablet Take 1 tablet by mouth daily.        . pantoprazole (PROTONIX) 40 MG tablet Take 40 mg by mouth as needed.        . potassium chloride (K-DUR) 10 MEQ tablet TAKE 1 TABLET ONCE DAILY.  30 tablet  2   No current facility-administered medications on file prior to visit.    Allergies  Allergen Reactions  . Sulfur     Pt doesn't remember reaction.    Past Medical History  Diagnosis Date  . MVP (mitral valve prolapse)   . Hypertension   . Chest tightness   .  Palpitation   . PVC's (premature ventricular contractions)   . Breast cancer     right  . Polycythemia   . Chronic gastritis   . Diverticulosis   . Candida esophagitis   . Arthritis     Past Surgical History  Procedure Laterality Date  . Tonsillectomy    . Appendectomy    . Cardiovascular stress test  12/23/2000    EF 70%  . Transthoracic echocardiogram  03/05/2010    EF 55-60%  . Abdominal hysterectomy      ovaries taken  . Breast lumpectomy      right  . Elbow fracture surgery      left  . Cataract extraction      bilateral  . Hematoma evacuation      right    History  Smoking status  . Former Smoker  . Quit date: 08/24/1980  Smokeless tobacco  . Never Used    History  Alcohol Use  . Yes    Comment: occasional    Family History  Problem Relation Age of Onset  . Stomach cancer Mother   . Hypertension Father   . Stroke Father   . Hypertension Sister   . Hypertension Brother   . Prostate cancer Brother     . Colon cancer Neg Hx   . Prostate cancer Father   . Diabetes Brother     Reviw of Systems:  Reviewed in the HPI.  All other systems are negative.  Physical Exam: BP 130/62  Pulse 87  Ht 5\' 4"  (1.626 m)  Wt 100 lb (45.36 kg)  BMI 17.16 kg/m2 The patient is alert and oriented x 3.  The mood and affect are normal.   Skin: warm and dry.  Color is normal.   HEENT:   Neck is supple. His membranes are moist. The carotids are normal. No JVD. Lungs: Lungs are clear.  Heart: Regular rate S1-S2.  She is a soft systolic murmur. Abdomen: She is thin. She has good bowel sounds. There are no bruits  Extremities:  No clubbing cyanosis or edema Neuro:  Nonfocal.    ECG: 07/09/2013: Normal sinus rhythm at 87. She has some sinus arrhythmia. The EKG is otherwise normal  Assessment / Plan:

## 2013-07-09 NOTE — Assessment & Plan Note (Signed)
Stable.  No symptoms.  She remains active.

## 2013-07-09 NOTE — Assessment & Plan Note (Signed)
Her blood pressure remained stable. Continue with her same medications. She seems to be tolerating the HCTZ without difficulty to

## 2013-07-11 ENCOUNTER — Other Ambulatory Visit: Payer: Self-pay | Admitting: Internal Medicine

## 2013-07-11 ENCOUNTER — Other Ambulatory Visit: Payer: Self-pay | Admitting: Cardiovascular Disease

## 2013-07-20 ENCOUNTER — Ambulatory Visit: Payer: Medicare Other | Admitting: Internal Medicine

## 2013-07-28 ENCOUNTER — Other Ambulatory Visit: Payer: Self-pay | Admitting: Cardiovascular Disease

## 2013-08-01 ENCOUNTER — Other Ambulatory Visit (HOSPITAL_BASED_OUTPATIENT_CLINIC_OR_DEPARTMENT_OTHER): Payer: Medicare Other

## 2013-08-01 ENCOUNTER — Ambulatory Visit (HOSPITAL_BASED_OUTPATIENT_CLINIC_OR_DEPARTMENT_OTHER): Payer: Medicare Other | Admitting: Internal Medicine

## 2013-08-01 ENCOUNTER — Telehealth: Payer: Self-pay | Admitting: Internal Medicine

## 2013-08-01 ENCOUNTER — Telehealth: Payer: Self-pay | Admitting: *Deleted

## 2013-08-01 ENCOUNTER — Encounter: Payer: Self-pay | Admitting: Internal Medicine

## 2013-08-01 VITALS — BP 143/61 | HR 73 | Temp 98.0°F | Resp 20 | Ht 64.0 in | Wt 101.0 lb

## 2013-08-01 DIAGNOSIS — D751 Secondary polycythemia: Secondary | ICD-10-CM

## 2013-08-01 DIAGNOSIS — Z853 Personal history of malignant neoplasm of breast: Secondary | ICD-10-CM

## 2013-08-01 DIAGNOSIS — C50919 Malignant neoplasm of unspecified site of unspecified female breast: Secondary | ICD-10-CM

## 2013-08-01 DIAGNOSIS — D45 Polycythemia vera: Secondary | ICD-10-CM

## 2013-08-01 DIAGNOSIS — D473 Essential (hemorrhagic) thrombocythemia: Secondary | ICD-10-CM | POA: Diagnosis not present

## 2013-08-01 LAB — COMPREHENSIVE METABOLIC PANEL (CC13)
ALBUMIN: 3.6 g/dL (ref 3.5–5.0)
ALT: 11 U/L (ref 0–55)
AST: 16 U/L (ref 5–34)
Alkaline Phosphatase: 67 U/L (ref 40–150)
Anion Gap: 7 mEq/L (ref 3–11)
BUN: 18.2 mg/dL (ref 7.0–26.0)
CO2: 27 mEq/L (ref 22–29)
Calcium: 9.9 mg/dL (ref 8.4–10.4)
Chloride: 101 mEq/L (ref 98–109)
Creatinine: 1.3 mg/dL — ABNORMAL HIGH (ref 0.6–1.1)
GLUCOSE: 98 mg/dL (ref 70–140)
POTASSIUM: 5 meq/L (ref 3.5–5.1)
Sodium: 134 mEq/L — ABNORMAL LOW (ref 136–145)
Total Bilirubin: 0.42 mg/dL (ref 0.20–1.20)
Total Protein: 6.5 g/dL (ref 6.4–8.3)

## 2013-08-01 LAB — CBC WITH DIFFERENTIAL/PLATELET
BASO%: 0.2 % (ref 0.0–2.0)
Basophils Absolute: 0 10*3/uL (ref 0.0–0.1)
EOS ABS: 0.3 10*3/uL (ref 0.0–0.5)
EOS%: 3.6 % (ref 0.0–7.0)
HCT: 40.9 % (ref 34.8–46.6)
HEMOGLOBIN: 12.6 g/dL (ref 11.6–15.9)
LYMPH%: 9.5 % — ABNORMAL LOW (ref 14.0–49.7)
MCH: 20.9 pg — ABNORMAL LOW (ref 25.1–34.0)
MCHC: 30.8 g/dL — ABNORMAL LOW (ref 31.5–36.0)
MCV: 67.8 fL — AB (ref 79.5–101.0)
MONO#: 0.6 10*3/uL (ref 0.1–0.9)
MONO%: 6.5 % (ref 0.0–14.0)
NEUT#: 7.7 10*3/uL — ABNORMAL HIGH (ref 1.5–6.5)
NEUT%: 80.2 % — AB (ref 38.4–76.8)
Platelets: 503 10*3/uL — ABNORMAL HIGH (ref 145–400)
RBC: 6.03 10*6/uL — ABNORMAL HIGH (ref 3.70–5.45)
RDW: 17.7 % — AB (ref 11.2–14.5)
WBC: 9.6 10*3/uL (ref 3.9–10.3)
lymph#: 0.9 10*3/uL (ref 0.9–3.3)

## 2013-08-01 NOTE — Progress Notes (Signed)
[place], MD [name] [place] [address]  DIAGNOSIS: Malignant neoplasm of breast (female), unspecified site  POLYCYTHEMIA - Plan: CBC with Differential, Basic metabolic panel (Bmet) - [facility], CBC with Differential, CBC with Differential, CBC with Differential  Chief Complaint  Patient presents with  . POLYCYTHEMIA    CURRENT THERAPY: Phlebotomy if hematocrit is greater than or equal to 42%. The patient receives 300 mL of normal saline following each 1-unit phlebotomy. Anagrelide 0.5 mg twice daily and aspirin 81 mg daily.    INTERVAL HISTORY: [name] 80 y.o. female with a history of polycythemia vera (04/21/2007) with positive JAK2 mutation is here for follow-up.  She was last seen by me on 04/04/2013. She was tried on hydroxyurea 500 mg but this was discontinued due to severe thrombocytopenia.  Overall, she is without complaints.  She denies any recent falls.  She has been dealing with aquagenic pruritus since diagnosis but describes it as being mild overall. She denies any recent hospitalizations, emergency room visits or headaches or visual changes. She had a  repeat mammogram in early November of this past year without evidence of malignancy. She will follow up with Dr. Olevia Perches for diarrhea maintanance and check up.     MEDICAL HISTORY: Past Medical History  Diagnosis Date  . MVP (mitral valve prolapse)   . Hypertension   . Chest tightness   . Palpitation   . PVC's (premature ventricular contractions)   . Breast cancer     right  . Polycythemia   . Chronic gastritis   . Diverticulosis   . Candida esophagitis   . Arthritis     INTERIM HISTORY: has Malignant Neoplasm of Breast (Female), Unspecified Site; POLYCYTHEMIA; GASTRITIS, CHRONIC; DIVERTICULOSIS OF COLON; ARTHRITIS; ABDOMINAL PAIN, EPIGASTRIC; HTN (hypertension); and MVP (mitral valve prolapse) on her problem list.    ALLERGIES:  is  allergic to sulfur.  MEDICATIONS: has a current medication list which includes the following prescription(s): amlodipine, anagrelide, aspirin, cetirizine hcl, vitamin d3, dicyclomine, fluticasone, glucosamine hcl, hydrochlorothiazide, lisinopril, loperamide, metoprolol succinate, multivitamin, pantoprazole, potassium chloride, and probiotic product.  SURGICAL HISTORY:  Past Surgical History  Procedure Laterality Date  . Tonsillectomy    . Appendectomy    . Cardiovascular stress test  12/23/2000    EF 70%  . Transthoracic echocardiogram  03/05/2010    EF 55-60%  . Abdominal hysterectomy      ovaries taken  . Breast lumpectomy      right  . Elbow fracture surgery      left  . Cataract extraction      bilateral  . Hematoma evacuation      right   PROBLEM LIST:  1. Polycythemia vera diagnosed in March 2009. JAK2 mutation was detected on 04/21/2007. Hemoglobin in April 2009 was 18.4 with hematocrit of 55.0. Platelet count on July 12, 2007, was 868,000. The patient has never had a bone marrow exam. She had been undergoing phlebotomies in the past, approximately once a month, for hematocrit greater than or equal to 45%. The patient has been on anagrelide 0.5 mg twice daily, as well as aspirin. She has not had any thrombotic complications. We will be carrying out a 1-unit phlebotomy whenever the hematocrit is greater than or equal to 42%. The patient does require normal saline 300 mL after phlebotomy because of symptomatic hypotension.  2. 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  Lumpectomy on 01/04/2006 followed by radiation treatments  under the direction of Dr. Gery Pray. The patient has not required any systemic therapy. She does have yearly mammograms.  3. Mitral valve prolapse.  4. Hypertension.  5. Diverticulosis.  6. History of esophageal stricture.  7. History of gastritis.  8. History of pelvic fractures after falling on August 23, 2006.  REVIEW OF SYSTEMS:    Constitutional: Denies fevers, chills or abnormal weight loss Eyes: Denies blurriness of vision Ears, nose, mouth, throat, and face: Denies mucositis or sore throat Respiratory: Denies cough, dyspnea or wheezes Cardiovascular: Denies palpitation, chest discomfort or lower extremity swelling Gastrointestinal:  Denies nausea, heartburn or change in bowel habits Skin: Denies abnormal skin rashes Lymphatics: Denies new lymphadenopathy or easy bruising Neurological:Denies numbness, tingling or new weaknesses Behavioral/Psych: Mood is stable, no new changes  All other systems were reviewed with the patient and are negative.  PHYSICAL EXAMINATION: ECOG PERFORMANCE STATUS: 0 - Asymptomatic  Blood pressure 143/61, pulse 73, temperature 98 F (36.7 C), temperature source Oral, resp. rate 20, height 5\' 4"  (1.626 m), weight 101 lb (45.813 kg).  GENERAL:alert, no distress and comfortable; she has scoliosis and looks her stated age.  SKIN: skin color, texture, turgor are normal, no rashes but seborrheic keratosis.  EYES: normal, Conjunctiva are pink and non-injected, sclera clear OROPHARYNX:no exudate, no erythema and lips, buccal mucosa, and tongue normal  NECK: supple, thyroid normal size, non-tender, without nodularity LYMPH:  no palpable lymphadenopathy in the cervical, axillary or supraclavicular LUNGS: clear to auscultation and percussion with normal breathing effort HEART: regular rate & rhythm and no murmurs and no lower extremity edema ABDOMEN:abdomen soft, non-tender and normal bowel sounds Musculoskeletal:no cyanosis of digits and no clubbing  NEURO: alert & oriented x 3 with fluent speech, no focal motor/sensory deficits  LABORATORY DATA: Results for orders placed in visit on 08/01/13 (from the past 48 hour(s))  CBC WITH DIFFERENTIAL     Status: Abnormal   Collection Time    08/01/13  9:28 AM      Result Value Ref Range   WBC 9.6  3.9 - 10.3 10e3/uL   NEUT# 7.7 (*) 1.5 - 6.5  10e3/uL   HGB 12.6  11.6 - 15.9 g/dL   HCT 40.9  34.8 - 46.6 %   Platelets 503 (*) 145 - 400 10e3/uL   MCV 67.8 (*) 79.5 - 101.0 fL   MCH 20.9 (*) 25.1 - 34.0 pg   MCHC 30.8 (*) 31.5 - 36.0 g/dL   RBC 6.03 (*) 3.70 - 5.45 10e6/uL   RDW 17.7 (*) 11.2 - 14.5 %   lymph# 0.9  0.9 - 3.3 10e3/uL   MONO# 0.6  0.1 - 0.9 10e3/uL   Eosinophils Absolute 0.3  0.0 - 0.5 10e3/uL   Basophils Absolute 0.0  0.0 - 0.1 10e3/uL   NEUT% 80.2 (*) 38.4 - 76.8 %   LYMPH% 9.5 (*) 14.0 - 49.7 %   MONO% 6.5  0.0 - 14.0 %   EOS% 3.6  0.0 - 7.0 %   BASO% 0.2  0.0 - 2.0 %  COMPREHENSIVE METABOLIC PANEL (QM25)     Status: Abnormal   Collection Time    08/01/13  9:28 AM      Result Value Ref Range   Sodium 134 (*) 136 - 145 mEq/L   Potassium 5.0  3.5 - 5.1 mEq/L   Chloride 101  98 - 109 mEq/L   CO2 27  22 - 29 mEq/L   Glucose 98  70 - 140 mg/dl  BUN 18.2  7.0 - 26.0 mg/dL   Creatinine 1.3 (*) 0.6 - 1.1 mg/dL   Total Bilirubin 0.42  0.20 - 1.20 mg/dL   Alkaline Phosphatase 67  40 - 150 U/L   AST 16  5 - 34 U/L   ALT 11  0 - 55 U/L   Total Protein 6.5  6.4 - 8.3 g/dL   Albumin 3.6  3.5 - 5.0 g/dL   Calcium 9.9  8.4 - 10.4 mg/dL   Anion Gap 7  3 - 11 mEq/L    Labs:  Lab Results  Component Value Date   WBC 9.6 08/01/2013   HGB 12.6 08/01/2013   HCT 40.9 08/01/2013   MCV 67.8* 08/01/2013   PLT 503* 08/01/2013   NEUTROABS 7.7* 08/01/2013      Chemistry      Component Value Date/Time   NA 134* 08/01/2013 0928   NA 130* 05/16/2013 1216   K 5.0 08/01/2013 0928   K 4.4 05/16/2013 1216   CL 96 05/16/2013 1216   CL 99 05/25/2012 0942   CO2 27 08/01/2013 0928   CO2 27 05/16/2013 1216   BUN 18.2 08/01/2013 0928   BUN 20 05/16/2013 1216   CREATININE 1.3* 08/01/2013 0928   CREATININE 1.2 05/16/2013 1216      Component Value Date/Time   CALCIUM 9.9 08/01/2013 0928   CALCIUM 9.7 05/16/2013 1216   ALKPHOS 67 08/01/2013 0928   ALKPHOS 62 05/16/2013 1216   AST 16 08/01/2013 0928   AST 21 05/16/2013 1216   ALT 11 08/01/2013 0928   ALT  19 05/16/2013 1216   BILITOT 0.42 08/01/2013 0928   BILITOT 0.8 05/16/2013 1216     GFR Estimated Creatinine Clearance: 22.9 ml/min (by C-G formula based on Cr of 1.3).  CBC:  Recent Labs Lab 08/01/13 0928  WBC 9.6  NEUTROABS 7.7*  HGB 12.6  HCT 40.9  MCV 67.8*  PLT 503*   IMAGING STUDIES:  1. Ultrasound of the abdomen, limited, on 01/21/2006, showed a splenic volume of 73 cc with maximum ultrasound dimension of 8.8 cm. Spleen measured 8.8 x 4.1 x 3.9 cm, for a volume of 73 cc. No focal lesions were identified.  2. Diagnostic bilateral digital mammogram carried out at [facility] on 01/01/2011 showed no suspicious findings.  3. Mammogram, diagnostic, bilateral on 01/04/2012 showed no significant abnormalities. There were post lumpectomy changes present involving the right breast.  ASSESSMENT: [name] 80 y.o. female with a history of Malignant neoplasm of breast (female), unspecified site  POLYCYTHEMIA - Plan: CBC with Differential, Basic metabolic panel (Bmet) - [facility], CBC with Differential, CBC with Differential, CBC with Differential   PLAN:  1. Polycythemia vera. --The patient is doing well at the present time with  acceptable blood counts. There is no indication for phlebotomy today.  Her hematocrit is 40.9. Patient will continue her anagrelide 0.5 mg bid, aspirin and phebotomy if hematocrit is greater than 42%.  We will continue to check her CBC monthly.   2. Thrombocytosis secondary to # 1. --If plts are greater than 600K, we will increase her anagrelide to 2 mg tid.  They are 503K.  3. DCIS  --Involving the right breast with biopsy on 11/29/2005.  Lumpectomy on 01/04/2006 followed by radiation treatments under the direction of Dr. Gery Pray.  Her mammogram on 01/03/2013 was negative.    4. Follow-up. --Her next appointment here will be in about 4 months at which time we will check CBC  and chemistries. Continue CBC monthly.  All questions were  answered. The patient knows to call the clinic with any problems, questions or concerns. We can certainly see the patient much sooner if necessary.  Patient was provided a handout on polycythemia vera.   I spent 15 minutes counseling the patient face to face. The total time spent in the appointment was 25 minutes.    Eaven Schwager, MD 08/01/2013 1:47 PM

## 2013-08-01 NOTE — Telephone Encounter (Signed)
Per staff message and POF I have scheduled appts. Advised scheduler of appts. JMW  

## 2013-08-01 NOTE — Telephone Encounter (Signed)
Gave pt appt for lab and Md , emailed [facility] regarding phlebotomy

## 2013-08-02 ENCOUNTER — Encounter: Payer: Self-pay | Admitting: Internal Medicine

## 2013-08-02 ENCOUNTER — Ambulatory Visit (INDEPENDENT_AMBULATORY_CARE_PROVIDER_SITE_OTHER): Payer: Medicare Other | Admitting: Internal Medicine

## 2013-08-02 VITALS — BP 120/70 | HR 84 | Ht 63.0 in | Wt 102.0 lb

## 2013-08-02 DIAGNOSIS — R197 Diarrhea, unspecified: Secondary | ICD-10-CM

## 2013-08-02 MED ORDER — COLESTIPOL HCL 1 G PO TABS: 1.0000 g | ORAL_TABLET | Freq: Every day | ORAL | Status: DC

## 2013-08-02 NOTE — Patient Instructions (Addendum)
We have sent the following medications to your pharmacy for you to pick up at your convenience:Colestipol.   Dr [name]

## 2013-08-02 NOTE — Progress Notes (Signed)
[name] 1926-07-23 [id number]  Note: This dictation was prepared with Dragon digital system. Any transcriptional errors that result from this procedure are unintentional.   History of Present Illness: This is a 80 year old white female with irritable bowel syndrome and intermittent diarrhea. Since her last visit in April 2015 her symptoms had improved but she still has episodes of watery diarrhea about every 2 weeks. They occur at night or during the day. She was started on probiotics and she takes dicyclomine 10 mg when necessary. She denies abdominal pain. Last colonoscopy in February 2007 showed diverticulosis. Her stool for pathogens was negative    Past Medical History  Diagnosis Date  . MVP (mitral valve prolapse)   . Hypertension   . Chest tightness   . Palpitation   . PVC's (premature ventricular contractions)   . Breast cancer     right  . Polycythemia   . Chronic gastritis   . Diverticulosis   . Candida esophagitis   . Arthritis     Past Surgical History  Procedure Laterality Date  . Tonsillectomy    . Appendectomy    . Cardiovascular stress test  12/23/2000    EF 70%  . Transthoracic echocardiogram  03/05/2010    EF 55-60%  . Abdominal hysterectomy      ovaries taken  . Breast lumpectomy      right  . Elbow fracture surgery      left  . Cataract extraction      bilateral  . Hematoma evacuation      right    Allergies  Allergen Reactions  . Sulfur     Pt doesn't remember reaction.    Family history and social history have been reviewed.  Review of Systems: Negative for abdominal pain or rectal bleeding  The remainder of the 10 point ROS is negative except as outlined in the H&P  Physical Exam: General Appearance thin, in no distress Eyes  Non icteric  HEENT  Non traumatic, normocephalic  Mouth No lesion, tongue papillated, no cheilosis Neck Supple without adenopathy, thyroid not enlarged, no carotid bruits, no JVD Lungs Clear to  auscultation bilaterally COR Normal S1, normal S2, regular rhythm, no murmur, quiet precordium Abdomen normal active bowel sounds. Soft with minimal tenderness in left upper quadrant. No palpable mass. Liver edge at costal margin Rectal not done Extremities  No pedal edema Skin No lesions Neurological Alert and oriented x 3 Psychological Normal mood and affect  Assessment and Plan:   80 year old white female with irritable bowel syndrome and diverticulosis. Somewhat improved on antispasmodics and probiotics. She came with her daughter today who suggests using medication for bile overflow. We will start colestipol 1 g daily. She will increase her Bentyl to 10 mg 4 times a day on days she is  having diarrhea and reduce the dose when she is having a good day; we have consider colonoscopy in the past but because of her age and rather frail status I will hold off on colonoscopy at this time    Delfin Edis 08/02/2013

## 2013-08-06 ENCOUNTER — Telehealth: Payer: Self-pay | Admitting: Internal Medicine

## 2013-08-06 NOTE — Telephone Encounter (Signed)
Called pt left message, mailed appt for lab and phlebotomy untill November 2015

## 2013-08-07 ENCOUNTER — Telehealth: Payer: Self-pay | Admitting: Internal Medicine

## 2013-08-07 NOTE — Telephone Encounter (Signed)
Pt called and she is aware of all appts

## 2013-08-13 ENCOUNTER — Other Ambulatory Visit: Payer: Self-pay | Admitting: Cardiovascular Disease

## 2013-08-28 ENCOUNTER — Other Ambulatory Visit: Payer: Self-pay | Admitting: Cardiovascular Disease

## 2013-09-03 ENCOUNTER — Ambulatory Visit: Payer: Medicare Other

## 2013-09-03 ENCOUNTER — Other Ambulatory Visit (HOSPITAL_BASED_OUTPATIENT_CLINIC_OR_DEPARTMENT_OTHER): Payer: Medicare Other

## 2013-09-03 DIAGNOSIS — D45 Polycythemia vera: Secondary | ICD-10-CM

## 2013-09-03 DIAGNOSIS — D751 Secondary polycythemia: Secondary | ICD-10-CM

## 2013-09-03 LAB — CBC WITH DIFFERENTIAL/PLATELET
BASO%: 0.3 % (ref 0.0–2.0)
BASOS ABS: 0 10*3/uL (ref 0.0–0.1)
EOS%: 1.9 % (ref 0.0–7.0)
Eosinophils Absolute: 0.2 10*3/uL (ref 0.0–0.5)
HEMATOCRIT: 41.2 % (ref 34.8–46.6)
HGB: 12.6 g/dL (ref 11.6–15.9)
LYMPH%: 6.1 % — AB (ref 14.0–49.7)
MCH: 20.3 pg — ABNORMAL LOW (ref 25.1–34.0)
MCHC: 30.5 g/dL — ABNORMAL LOW (ref 31.5–36.0)
MCV: 66.6 fL — AB (ref 79.5–101.0)
MONO#: 0.9 10*3/uL (ref 0.1–0.9)
MONO%: 7.4 % (ref 0.0–14.0)
NEUT%: 84.3 % — ABNORMAL HIGH (ref 38.4–76.8)
NEUTROS ABS: 9.9 10*3/uL — AB (ref 1.5–6.5)
PLATELETS: 560 10*3/uL — AB (ref 145–400)
RBC: 6.19 10*6/uL — ABNORMAL HIGH (ref 3.70–5.45)
RDW: 18.8 % — AB (ref 11.2–14.5)
WBC: 11.8 10*3/uL — ABNORMAL HIGH (ref 3.9–10.3)
lymph#: 0.7 10*3/uL — ABNORMAL LOW (ref 0.9–3.3)

## 2013-09-03 NOTE — Progress Notes (Signed)
Per Dr Boyce Medici office note, patient requires phlebotomy for HCT greater than 42.  HCT today is 41.2, no treatment required.  Spoke to patient in lobby and given copy of labs.  Pt verbalized understanding.

## 2013-10-09 ENCOUNTER — Other Ambulatory Visit (HOSPITAL_BASED_OUTPATIENT_CLINIC_OR_DEPARTMENT_OTHER): Payer: Medicare Other

## 2013-10-09 ENCOUNTER — Other Ambulatory Visit: Payer: Self-pay | Admitting: Internal Medicine

## 2013-10-09 ENCOUNTER — Encounter: Payer: Self-pay | Admitting: Medical Oncology

## 2013-10-09 DIAGNOSIS — D45 Polycythemia vera: Secondary | ICD-10-CM | POA: Diagnosis not present

## 2013-10-09 DIAGNOSIS — D751 Secondary polycythemia: Secondary | ICD-10-CM

## 2013-10-09 LAB — CBC WITH DIFFERENTIAL/PLATELET
BASO%: 0.3 % (ref 0.0–2.0)
BASOS ABS: 0 10*3/uL (ref 0.0–0.1)
EOS ABS: 0.2 10*3/uL (ref 0.0–0.5)
EOS%: 2.1 % (ref 0.0–7.0)
HCT: 40.5 % (ref 34.8–46.6)
HGB: 12.4 g/dL (ref 11.6–15.9)
LYMPH%: 7.3 % — AB (ref 14.0–49.7)
MCH: 20.4 pg — ABNORMAL LOW (ref 25.1–34.0)
MCHC: 30.6 g/dL — ABNORMAL LOW (ref 31.5–36.0)
MCV: 66.5 fL — ABNORMAL LOW (ref 79.5–101.0)
MONO#: 0.8 10*3/uL (ref 0.1–0.9)
MONO%: 8 % (ref 0.0–14.0)
NEUT%: 82.3 % — ABNORMAL HIGH (ref 38.4–76.8)
NEUTROS ABS: 8 10*3/uL — AB (ref 1.5–6.5)
Platelets: 619 10*3/uL — ABNORMAL HIGH (ref 145–400)
RBC: 6.09 10*6/uL — AB (ref 3.70–5.45)
RDW: 19 % — ABNORMAL HIGH (ref 11.2–14.5)
WBC: 9.7 10*3/uL (ref 3.9–10.3)
lymph#: 0.7 10*3/uL — ABNORMAL LOW (ref 0.9–3.3)

## 2013-10-09 NOTE — Progress Notes (Signed)
Pt's Hct 40.5 today. Per Dr. Boyce Medici office notes phlebotomy is Hct > or = to 42. I spoke with pt and she is feeling fine. I asked her to call if any problems and she voiced understanding.

## 2013-10-15 ENCOUNTER — Other Ambulatory Visit: Payer: Self-pay | Admitting: Medical Oncology

## 2013-10-15 MED ORDER — ANAGRELIDE HCL 0.5 MG PO CAPS: 0.5000 mg | ORAL_CAPSULE | Freq: Two times a day (BID) | ORAL | Status: DC

## 2013-11-05 ENCOUNTER — Other Ambulatory Visit (HOSPITAL_BASED_OUTPATIENT_CLINIC_OR_DEPARTMENT_OTHER): Payer: Medicare Other

## 2013-11-05 ENCOUNTER — Ambulatory Visit: Payer: Medicare Other

## 2013-11-05 DIAGNOSIS — D751 Secondary polycythemia: Secondary | ICD-10-CM

## 2013-11-05 LAB — CBC WITH DIFFERENTIAL/PLATELET
BASO%: 1 % (ref 0.0–2.0)
Basophils Absolute: 0.1 10*3/uL (ref 0.0–0.1)
EOS%: 1.8 % (ref 0.0–7.0)
Eosinophils Absolute: 0.2 10*3/uL (ref 0.0–0.5)
HCT: 41.4 % (ref 34.8–46.6)
HGB: 12.8 g/dL (ref 11.6–15.9)
LYMPH%: 6.8 % — AB (ref 14.0–49.7)
MCH: 20.5 pg — AB (ref 25.1–34.0)
MCHC: 30.9 g/dL — AB (ref 31.5–36.0)
MCV: 66.4 fL — AB (ref 79.5–101.0)
MONO#: 0.7 10*3/uL (ref 0.1–0.9)
MONO%: 6.3 % (ref 0.0–14.0)
NEUT#: 9.1 10*3/uL — ABNORMAL HIGH (ref 1.5–6.5)
NEUT%: 84.1 % — ABNORMAL HIGH (ref 38.4–76.8)
Platelets: 521 10*3/uL — ABNORMAL HIGH (ref 145–400)
RBC: 6.24 10*6/uL — AB (ref 3.70–5.45)
RDW: 18.1 % — ABNORMAL HIGH (ref 11.2–14.5)
WBC: 10.8 10*3/uL — AB (ref 3.9–10.3)
lymph#: 0.7 10*3/uL — ABNORMAL LOW (ref 0.9–3.3)

## 2013-11-05 NOTE — Progress Notes (Signed)
HCT: 41.4 today, according to Dr. Boyce Medici note phlebotomy not needed today since HCT is less than 42.  Pt given copy of labs and verbalized understanding.

## 2013-11-06 ENCOUNTER — Other Ambulatory Visit: Payer: Self-pay | Admitting: Cardiovascular Disease

## 2013-11-16 DIAGNOSIS — H04123 Dry eye syndrome of bilateral lacrimal glands: Secondary | ICD-10-CM | POA: Diagnosis not present

## 2013-11-16 DIAGNOSIS — Z961 Presence of intraocular lens: Secondary | ICD-10-CM | POA: Diagnosis not present

## 2013-11-16 DIAGNOSIS — H47323 Drusen of optic disc, bilateral: Secondary | ICD-10-CM | POA: Diagnosis not present

## 2013-11-21 DIAGNOSIS — Z23 Encounter for immunization: Secondary | ICD-10-CM | POA: Diagnosis not present

## 2013-11-27 ENCOUNTER — Other Ambulatory Visit: Payer: Self-pay | Admitting: Cardiovascular Disease

## 2013-11-30 ENCOUNTER — Encounter: Payer: Self-pay | Admitting: *Deleted

## 2013-11-30 ENCOUNTER — Other Ambulatory Visit: Payer: Self-pay | Admitting: *Deleted

## 2013-11-30 DIAGNOSIS — C50919 Malignant neoplasm of unspecified site of unspecified female breast: Secondary | ICD-10-CM

## 2013-12-03 ENCOUNTER — Ambulatory Visit (HOSPITAL_BASED_OUTPATIENT_CLINIC_OR_DEPARTMENT_OTHER): Payer: Medicare Other

## 2013-12-03 ENCOUNTER — Telehealth: Payer: Self-pay | Admitting: *Deleted

## 2013-12-03 ENCOUNTER — Ambulatory Visit (HOSPITAL_BASED_OUTPATIENT_CLINIC_OR_DEPARTMENT_OTHER): Payer: Medicare Other | Admitting: Hematology

## 2013-12-03 ENCOUNTER — Telehealth: Payer: Self-pay | Admitting: Hematology

## 2013-12-03 ENCOUNTER — Encounter: Payer: Self-pay | Admitting: Hematology

## 2013-12-03 ENCOUNTER — Other Ambulatory Visit (HOSPITAL_BASED_OUTPATIENT_CLINIC_OR_DEPARTMENT_OTHER): Payer: Medicare Other

## 2013-12-03 VITALS — BP 162/62 | HR 79 | Temp 97.8°F | Resp 18 | Ht 63.0 in | Wt 99.0 lb

## 2013-12-03 DIAGNOSIS — D473 Essential (hemorrhagic) thrombocythemia: Secondary | ICD-10-CM | POA: Diagnosis not present

## 2013-12-03 DIAGNOSIS — D45 Polycythemia vera: Secondary | ICD-10-CM

## 2013-12-03 DIAGNOSIS — D751 Secondary polycythemia: Secondary | ICD-10-CM

## 2013-12-03 DIAGNOSIS — Z853 Personal history of malignant neoplasm of breast: Secondary | ICD-10-CM

## 2013-12-03 DIAGNOSIS — C50919 Malignant neoplasm of unspecified site of unspecified female breast: Secondary | ICD-10-CM

## 2013-12-03 LAB — CBC WITH DIFFERENTIAL/PLATELET
BASO%: 0.2 % (ref 0.0–2.0)
Basophils Absolute: 0 10*3/uL (ref 0.0–0.1)
EOS%: 2.1 % (ref 0.0–7.0)
Eosinophils Absolute: 0.2 10*3/uL (ref 0.0–0.5)
HCT: 43.8 % (ref 34.8–46.6)
HGB: 13.7 g/dL (ref 11.6–15.9)
LYMPH#: 0.6 10*3/uL — AB (ref 0.9–3.3)
LYMPH%: 5.7 % — ABNORMAL LOW (ref 14.0–49.7)
MCH: 21 pg — ABNORMAL LOW (ref 25.1–34.0)
MCHC: 31.3 g/dL — AB (ref 31.5–36.0)
MCV: 67.2 fL — ABNORMAL LOW (ref 79.5–101.0)
MONO#: 0.9 10*3/uL (ref 0.1–0.9)
MONO%: 8.8 % (ref 0.0–14.0)
NEUT#: 8.7 10*3/uL — ABNORMAL HIGH (ref 1.5–6.5)
NEUT%: 83.2 % — ABNORMAL HIGH (ref 38.4–76.8)
NRBC: 0 % (ref 0–0)
Platelets: 539 10*3/uL — ABNORMAL HIGH (ref 145–400)
RBC: 6.52 10*6/uL — AB (ref 3.70–5.45)
RDW: 18.3 % — ABNORMAL HIGH (ref 11.2–14.5)
WBC: 10.4 10*3/uL — AB (ref 3.9–10.3)

## 2013-12-03 LAB — COMPREHENSIVE METABOLIC PANEL (CC13)
ALBUMIN: 3.9 g/dL (ref 3.5–5.0)
ALT: 17 U/L (ref 0–55)
AST: 17 U/L (ref 5–34)
Alkaline Phosphatase: 76 U/L (ref 40–150)
Anion Gap: 7 mEq/L (ref 3–11)
BUN: 19.4 mg/dL (ref 7.0–26.0)
CHLORIDE: 99 meq/L (ref 98–109)
CO2: 26 mEq/L (ref 22–29)
CREATININE: 1.2 mg/dL — AB (ref 0.6–1.1)
Calcium: 9.9 mg/dL (ref 8.4–10.4)
Glucose: 107 mg/dl (ref 70–140)
POTASSIUM: 4.5 meq/L (ref 3.5–5.1)
Sodium: 133 mEq/L — ABNORMAL LOW (ref 136–145)
Total Bilirubin: 0.41 mg/dL (ref 0.20–1.20)
Total Protein: 6.8 g/dL (ref 6.4–8.3)

## 2013-12-03 NOTE — Telephone Encounter (Signed)
gv and printed appt sched and avs for pt for Dec thru June 2016...sed added tx.

## 2013-12-03 NOTE — Telephone Encounter (Signed)
Per POF I have scheduled appts for phl.  JMW

## 2013-12-03 NOTE — Progress Notes (Signed)
Phlebotomy performed in right antecubital with 16G needle.  Approx. 546g of blood obtained and wasted.  Pt tolerated procedure without difficulty.  Nourishments given. 1220 -  Pt was stable at discharge via ambulation by self.

## 2013-12-03 NOTE — Progress Notes (Signed)
[facility] PROGRESS NOTE DATE OF VISIT: 12/03/2013  [name], MD [address], [place] [address]  DIAGNOSIS: Polycythemia vera - Plan: CBC with Differential, Breast Cancer  Chief Complaint  Patient presents with  . Follow-up    CURRENT THERAPY: Phlebotomy if hematocrit is greater than or equal to 42%. The patient receives 300 mL of normal saline following each 1-unit phlebotomy. Anagrelide 0.5 mg twice daily and aspirin 81 mg daily.    INTERVAL HISTORY:  [name] 80 y.o. female with a history of polycythemia vera (04/21/2007) with positive JAK2 mutation is here for follow-up.  She was last seen by Dr Juliann Mule  on 08/01/2013. She was tried on hydroxyurea 500 mg but this was discontinued due to severe thrombocytopenia.  Overall, she is without complaints.  She denies any recent falls.  She has been dealing with aquagenic pruritus since diagnosis but describes it as being mild overall. She denies any recent hospitalizations, emergency room visits or headaches or visual changes. She had a  repeat mammogram in early November of this past year without evidence of malignancy. She will follow up with Dr. Olevia Perches for diarrhea maintanance and check up.    She did not get a TP for several months now. She is now due for her annual mammogram. She has received her flu shot this year.  MEDICAL HISTORY: Past Medical History  Diagnosis Date  . MVP (mitral valve prolapse)   . Hypertension   . Chest tightness   . Palpitation   . PVC's (premature ventricular contractions)   . Breast cancer     right  . Polycythemia   . Chronic gastritis   . Diverticulosis   . Candida esophagitis   . Arthritis     INTERIM HISTORY: has Malignant neoplasm of female breast; POLYCYTHEMIA; GASTRITIS, CHRONIC; DIVERTICULOSIS OF COLON; ARTHRITIS; ABDOMINAL PAIN, EPIGASTRIC; HTN (hypertension); and MVP (mitral valve prolapse) on her problem list.    ALLERGIES:  is  allergic to sulfur.  MEDICATIONS: has a current medication list which includes the following prescription(s): amlodipine, anagrelide, aspirin, cetirizine hcl, vitamin d3, colestipol, dicyclomine, fluticasone, glucosamine hcl, hydrochlorothiazide, lisinopril, loperamide, metoprolol succinate, multivitamin, pantoprazole, potassium chloride, and probiotic product.  SURGICAL HISTORY:  Past Surgical History  Procedure Laterality Date  . Tonsillectomy    . Appendectomy    . Cardiovascular stress test  12/23/2000    EF 70%  . Transthoracic echocardiogram  03/05/2010    EF 55-60%  . Abdominal hysterectomy      ovaries taken  . Breast lumpectomy      right  . Elbow fracture surgery      left  . Cataract extraction      bilateral  . Hematoma evacuation      right   PROBLEM LIST:  1. Polycythemia vera diagnosed in March 2009. JAK2 mutation was detected on 04/21/2007. Hemoglobin in April 2009 was 18.4 with hematocrit of 55.0. Platelet count on July 12, 2007, was 868,000. The patient has never had a bone marrow exam. She had been undergoing phlebotomies in the past, approximately once a month, for hematocrit greater than or equal to 45%. The patient has been on anagrelide 0.5 mg twice daily, as well as aspirin. She has not had any thrombotic complications. We will be carrying out a 1-unit phlebotomy whenever the hematocrit is greater than or equal to 42%. The patient does require normal saline 300 mL after phlebotomy because of symptomatic hypotension.  2. 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  Lumpectomy on 01/04/2006 followed by radiation treatments under the direction of Dr. Gery Pray. The patient has not required any systemic therapy. She does have yearly mammograms.  3. Mitral valve prolapse.  4. Hypertension.  5. Diverticulosis.  6. History of esophageal stricture.  7. History of gastritis.  8. History of pelvic fractures after falling on August 23, 2006.  REVIEW OF  SYSTEMS:   Constitutional: Denies fevers, chills or abnormal weight loss Eyes: Denies blurriness of vision Ears, nose, mouth, throat, and face: Denies mucositis or sore throat Respiratory: Denies cough, dyspnea or wheezes Cardiovascular: Denies palpitation, chest discomfort or lower extremity swelling Gastrointestinal:  Denies nausea, heartburn or change in bowel habits Skin: Denies abnormal skin rashes Lymphatics: Denies new lymphadenopathy or easy bruising Neurological:Denies numbness, tingling or new weaknesses Behavioral/Psych: Mood is stable, no new changes  All other systems were reviewed with the patient and are negative.  PHYSICAL EXAMINATION: ECOG PERFORMANCE STATUS: 0  Blood pressure 162/62, pulse 79, temperature 97.8 F (36.6 C), temperature source Oral, resp. rate 18, height 5\' 3"  (1.6 m), weight 99 lb (44.906 kg), SpO2 100 %.  GENERAL:alert, no distress and comfortable; she has scoliosis and looks her stated age.  SKIN: skin color, texture, turgor are normal, no rashes but seborrheic keratosis.  EYES: normal, Conjunctiva are pink and non-injected, sclera clear OROPHARYNX:no exudate, no erythema and lips, buccal mucosa, and tongue normal  NECK: supple, thyroid normal size, non-tender, without nodularity LYMPH:  no palpable lymphadenopathy in the cervical, axillary or supraclavicular LUNGS: clear to auscultation and percussion with normal breathing effort HEART: regular rate & rhythm and no murmurs and no lower extremity edema ABDOMEN:abdomen soft, non-tender and normal bowel sounds Musculoskeletal:no cyanosis of digits and no clubbing  NEURO: alert & oriented x 3 with fluent speech, no focal motor/sensory deficits  LABORATORY DATA: Results for orders placed or performed in visit on 12/03/13 (from the past 48 hour(s))  CBC with Differential     Status: Abnormal   Collection Time: 12/03/13 10:24 AM  Result Value Ref Range   WBC 10.4 (H) 3.9 - 10.3 10e3/uL   NEUT# 8.7  (H) 1.5 - 6.5 10e3/uL   HGB 13.7 11.6 - 15.9 g/dL   HCT 43.8 34.8 - 46.6 %   Platelets 539 (H) 145 - 400 10e3/uL   MCV 67.2 (L) 79.5 - 101.0 fL   MCH 21.0 (L) 25.1 - 34.0 pg   MCHC 31.3 (L) 31.5 - 36.0 g/dL   RBC 6.52 (H) 3.70 - 5.45 10e6/uL   RDW 18.3 (H) 11.2 - 14.5 %   lymph# 0.6 (L) 0.9 - 3.3 10e3/uL   MONO# 0.9 0.1 - 0.9 10e3/uL   Eosinophils Absolute 0.2 0.0 - 0.5 10e3/uL   Basophils Absolute 0.0 0.0 - 0.1 10e3/uL   NEUT% 83.2 (H) 38.4 - 76.8 %   LYMPH% 5.7 (L) 14.0 - 49.7 %   MONO% 8.8 0.0 - 14.0 %   EOS% 2.1 0.0 - 7.0 %   BASO% 0.2 0.0 - 2.0 %   nRBC 0 0 - 0 %  Comprehensive metabolic panel (Cmet) - [facility]     Status: Abnormal   Collection Time: 12/03/13 10:24 AM  Result Value Ref Range   Sodium 133 (L) 136 - 145 mEq/L   Potassium 4.5 3.5 - 5.1 mEq/L   Chloride 99 98 - 109 mEq/L   CO2 26 22 - 29 mEq/L   Glucose 107 70 - 140 mg/dl   BUN 19.4  7.0 - 26.0 mg/dL   Creatinine 1.2 (H) 0.6 - 1.1 mg/dL   Total Bilirubin 0.41 0.20 - 1.20 mg/dL   Alkaline Phosphatase 76 40 - 150 U/L   AST 17 5 - 34 U/L   ALT 17 0 - 55 U/L   Total Protein 6.8 6.4 - 8.3 g/dL   Albumin 3.9 3.5 - 5.0 g/dL   Calcium 9.9 8.4 - 10.4 mg/dL   Anion Gap 7 3 - 11 mEq/L    Labs:  Lab Results  Component Value Date   WBC 10.4* 12/03/2013   HGB 13.7 12/03/2013   HCT 43.8 12/03/2013   MCV 67.2* 12/03/2013   PLT 539* 12/03/2013   NEUTROABS 8.7* 12/03/2013      Chemistry      Component Value Date/Time   NA 133* 12/03/2013 1024   NA 130* 05/16/2013 1216   K 4.5 12/03/2013 1024   K 4.4 05/16/2013 1216   CL 96 05/16/2013 1216   CL 99 05/25/2012 0942   CO2 26 12/03/2013 1024   CO2 27 05/16/2013 1216   BUN 19.4 12/03/2013 1024   BUN 20 05/16/2013 1216   CREATININE 1.2* 12/03/2013 1024   CREATININE 1.2 05/16/2013 1216      Component Value Date/Time   CALCIUM 9.9 12/03/2013 1024   CALCIUM 9.7 05/16/2013 1216   ALKPHOS 76 12/03/2013 1024   ALKPHOS 62 05/16/2013 1216   AST 17 12/03/2013 1024    AST 21 05/16/2013 1216   ALT 17 12/03/2013 1024   ALT 19 05/16/2013 1216   BILITOT 0.41 12/03/2013 1024   BILITOT 0.8 05/16/2013 1216     GFR Estimated Creatinine Clearance: 24.3 mL/min (by C-G formula based on Cr of 1.2).  CBC:  Recent Labs Lab 12/03/13 1024  WBC 10.4*  NEUTROABS 8.7*  HGB 13.7  HCT 43.8  MCV 67.2*  PLT 539*          IMAGING STUDIES:  1. Ultrasound of the abdomen, limited, on 01/21/2006, showed a splenic volume of 73 cc with maximum ultrasound dimension of 8.8 cm. Spleen measured 8.8 x 4.1 x 3.9 cm, for a volume of 73 cc. No focal lesions were identified.  2. Diagnostic bilateral digital mammogram carried out at [facility] on 01/01/2011 showed no suspicious findings.  3. Mammogram, diagnostic, bilateral on 01/04/2012 showed no significant abnormalities. There were post lumpectomy changes present involving the right breast.  ASSESSMENT: [name] 80 y.o. female with a history of Polycythemia vera - Plan: CBC with Differential   PLAN:  1. Polycythemia vera. --The patient is doing well at the present time with acceptable blood counts. There is indication for phlebotomy today as her hematocrit is 43.8 today. Patient will continue her anagrelide 0.5 mg bid, aspirin and phebotomy if hematocrit is greater than 42%.  We will continue to check her CBC every other month and TP if HCT>42.   2. Thrombocytosis secondary to # 1. --If plts are greater than 600K, we will increase her anagrelide to 2 mg tid.  They are 539 K.  3. DCIS  --Involving the right breast with biopsy on 11/29/2005.  Lumpectomy on 01/04/2006 followed by radiation treatments under the direction of Dr. Gery Pray.  Her mammogram on 01/03/2013 was negative.    4. Follow-up. --Her next appointment here will be in about 6 months at which time we will check CBC and chemistries. Continue CBC every other month +/- TP if hct trigger of 42 is reached.   All questions  were answered.  The patient knows to call the clinic with any problems, questions or concerns. We can certainly see the patient much sooner if necessary.  Patient was provided a handout on polycythemia vera.   I spent 15 minutes counseling the patient face to face. The total time spent in the appointment was 20 minutes.    Bernadene Bell, MD Medical Hematologist/Oncologist [place] Pager: [phone] Office No: [phone]

## 2013-12-03 NOTE — Patient Instructions (Signed)

## 2013-12-12 DIAGNOSIS — R197 Diarrhea, unspecified: Secondary | ICD-10-CM | POA: Diagnosis not present

## 2013-12-12 DIAGNOSIS — R350 Frequency of micturition: Secondary | ICD-10-CM | POA: Diagnosis not present

## 2013-12-12 DIAGNOSIS — N390 Urinary tract infection, site not specified: Secondary | ICD-10-CM | POA: Diagnosis not present

## 2013-12-18 DIAGNOSIS — N390 Urinary tract infection, site not specified: Secondary | ICD-10-CM | POA: Diagnosis not present

## 2013-12-18 DIAGNOSIS — I10 Essential (primary) hypertension: Secondary | ICD-10-CM | POA: Diagnosis not present

## 2013-12-18 DIAGNOSIS — M810 Age-related osteoporosis without current pathological fracture: Secondary | ICD-10-CM | POA: Diagnosis not present

## 2013-12-18 DIAGNOSIS — D473 Essential (hemorrhagic) thrombocythemia: Secondary | ICD-10-CM | POA: Diagnosis not present

## 2014-01-07 ENCOUNTER — Ambulatory Visit (INDEPENDENT_AMBULATORY_CARE_PROVIDER_SITE_OTHER): Payer: Medicare Other | Admitting: Cardiovascular Disease

## 2014-01-07 ENCOUNTER — Encounter: Payer: Self-pay | Admitting: Cardiovascular Disease

## 2014-01-07 VITALS — BP 126/62 | HR 91 | Ht 65.0 in | Wt 97.0 lb

## 2014-01-07 DIAGNOSIS — I341 Nonrheumatic mitral (valve) prolapse: Secondary | ICD-10-CM

## 2014-01-07 DIAGNOSIS — I10 Essential (primary) hypertension: Secondary | ICD-10-CM | POA: Diagnosis not present

## 2014-01-07 NOTE — Assessment & Plan Note (Signed)
BP is well controlled. Will  Check labs at that time

## 2014-01-07 NOTE — Patient Instructions (Addendum)
Your physician recommends that you continue on your current medications as directed. Please refer to the Current Medication list given to you today.  Your physician wants you to follow-up in: 1 year with Dr. Nahser.  You will receive a reminder letter in the mail two months in advance. If you don't receive a letter, please call our office to schedule the follow-up appointment. Your physician recommends that you return for lab work in: 1 year on the day of or a few days before your office visit with Dr. Nahser.  You will need to FAST for this appointment - nothing to eat or drink after midnight the night before except water.   

## 2014-01-07 NOTE — Progress Notes (Signed)
[name] Date of Birth  1926/02/12 [address]    [address] [place], [place]  [address]    [place],   [phone]  Fax  [phone]  [phone]  Fax [phone]   History of Present Illness:  Problem list: 1. Mitral valve prolapse 2. Breast cancer 3. Polycythemia 4. Hypertension  [name] is an 80 year old female with a history of mitral valve prolapse, hypertension, and polycythemia. She has phlebotomy  about once a month for her polycythemia. Her blood pressure typically is little bit elevated and then goes down quickly after her phlebotomy. She's feeling quite well. She's exercising regularly.  July 05, 2012:  No CP.  She has mild  Dyspnea.  She is still getting phlebotomy regularly.  She has some mild dyspnea when she first lies down but it typically resolves and she doe not have to sleep on any pillows.  She is able to do her normal activities without problems.   07/09/2013:  [name] is doing ok.  Not walking as much as she used to.  Stays active. h Has lots of cramps in her feet.   Dec. 7 ,2015:  [name] is an 80 yo who we follow for HTN and MVP.  She also has polycythemia and gets phlebotomy once a month.  Staying moderately active.   Gets fatigued easy She had labs a month ago   Current Outpatient Prescriptions on File Prior to Visit  Medication Sig Dispense Refill  . amLODipine (NORVASC) 2.5 MG tablet TAKE (1/2) TABLET DAILY. 15 tablet 1  . anagrelide (AGRYLIN) 0.5 MG capsule Take 1 capsule (0.5 mg total) by mouth 2 (two) times daily. 60 capsule 2  . aspirin 81 MG tablet Take 81 mg by mouth daily.      . Cetirizine HCl (ZYRTEC PO) Take by mouth as needed.      . Cholecalciferol (VITAMIN D3) 1000 UNITS CAPS Take 2,600 mg by mouth 2 (two) times daily.     . colestipol (COLESTID) 1 G tablet Take 1 tablet (1 g total) by mouth daily. 30 tablet 5  . dicyclomine (BENTYL) 10 MG  capsule TAKE  (1)  CAPSULE  TWICE DAILY AS NEEDED. 60 capsule 3  . fluticasone (FLONASE) 50 MCG/ACT nasal spray Place 2 sprays into the nose as needed.      . hydrochlorothiazide (MICROZIDE) 12.5 MG capsule TAKE (1) CAPSULE DAILY. 30 capsule 5  . lisinopril (PRINIVIL,ZESTRIL) 20 MG tablet TAKE 1 TABLET EACH DAY. 30 tablet 5  . loperamide (IMODIUM) 1 MG/5ML solution Take 5 mLs (1 mg total) by mouth daily. 150 mL 0  . metoprolol succinate (TOPROL-XL) 50 MG 24 hr tablet TAKE 1 TABLET ONCE DAILY WITH FOOD. 90 tablet 1  . Multiple Vitamin (MULTIVITAMIN) tablet Take 1 tablet by mouth daily.      . pantoprazole (PROTONIX) 40 MG tablet Take 40 mg by mouth as needed.      . potassium chloride (K-DUR) 10 MEQ tablet TAKE 1 TABLET ONCE DAILY. 30 tablet 5  . Probiotic Product (ULTRAFLORA IMMUNE HEALTH PO) Take 1 tablet by mouth daily.     No current facility-administered medications on file prior to visit.    Allergies  Allergen Reactions  . Sulfur Other (See Comments)    Pt doesn't remember reaction.    Past Medical History  Diagnosis Date  . MVP (mitral valve prolapse)   . Hypertension   .  Chest tightness   . Palpitation   . PVC's (premature ventricular contractions)   . Breast cancer     right  . Polycythemia   . Chronic gastritis   . Diverticulosis   . Candida esophagitis   . Arthritis     Past Surgical History  Procedure Laterality Date  . Tonsillectomy    . Appendectomy    . Cardiovascular stress test  12/23/2000    EF 70%  . Transthoracic echocardiogram  03/05/2010    EF 55-60%  . Abdominal hysterectomy      ovaries taken  . Breast lumpectomy      right  . Elbow fracture surgery      left  . Cataract extraction      bilateral  . Hematoma evacuation      right    History  Smoking status  . Former Smoker  . Quit date: 08/24/1980  Smokeless tobacco  . Never Used    History  Alcohol Use  . Yes    Comment: occasional    Family History  Problem Relation Age of  Onset  . Stomach cancer Mother   . Hypertension Father   . Stroke Father   . Hypertension Sister   . Hypertension Brother   . Prostate cancer Brother   . Colon cancer Neg Hx   . Prostate cancer Father   . Diabetes Brother     Reviw of Systems:  Reviewed in the HPI.  All other systems are negative.  Physical Exam: BP 126/62 mmHg  Pulse 91  Ht 5\' 5"  (1.651 m)  Wt 97 lb (43.999 kg)  BMI 16.14 kg/m2 The patient is alert and oriented x 3.  The mood and affect are normal.   Skin: warm and dry.  Color is normal.   HEENT:   Neck is supple. His membranes are moist. The carotids are normal. No JVD. Lungs: Lungs are clear.  Heart: Regular rate S1-S2.  She is a soft systolic murmur. Abdomen: She is thin. She has good bowel sounds. There are no bruits  Extremities:  No clubbing cyanosis or edema Neuro:  Nonfocal.    ECG: 07/09/2013: Normal sinus rhythm at 87. She has some sinus arrhythmia. The EKG is otherwise normal  Assessment / Plan:

## 2014-01-07 NOTE — Assessment & Plan Note (Signed)
Stable. No symptoms.

## 2014-01-08 ENCOUNTER — Other Ambulatory Visit: Payer: Self-pay | Admitting: Cardiovascular Disease

## 2014-01-08 DIAGNOSIS — Z853 Personal history of malignant neoplasm of breast: Secondary | ICD-10-CM | POA: Diagnosis not present

## 2014-01-08 DIAGNOSIS — Z1231 Encounter for screening mammogram for malignant neoplasm of breast: Secondary | ICD-10-CM | POA: Diagnosis not present

## 2014-01-16 ENCOUNTER — Other Ambulatory Visit: Payer: Self-pay | Admitting: Cardiovascular Disease

## 2014-01-28 ENCOUNTER — Other Ambulatory Visit: Payer: Medicare Other

## 2014-02-02 ENCOUNTER — Other Ambulatory Visit: Payer: Self-pay | Admitting: Cardiovascular Disease

## 2014-02-04 ENCOUNTER — Telehealth: Payer: Self-pay | Admitting: Hematology

## 2014-02-04 NOTE — Telephone Encounter (Signed)
pt called to r/s missed appt...done per pt request....pt aware of new d.t.Marland KitchenMarland KitchenMarland Kitchen

## 2014-02-05 ENCOUNTER — Other Ambulatory Visit: Payer: Self-pay | Admitting: Hematology

## 2014-02-05 ENCOUNTER — Other Ambulatory Visit: Payer: Self-pay | Admitting: Cardiovascular Disease

## 2014-02-06 ENCOUNTER — Other Ambulatory Visit (HOSPITAL_BASED_OUTPATIENT_CLINIC_OR_DEPARTMENT_OTHER): Payer: Medicare Other

## 2014-02-06 DIAGNOSIS — D45 Polycythemia vera: Secondary | ICD-10-CM | POA: Diagnosis not present

## 2014-02-06 LAB — CBC WITH DIFFERENTIAL/PLATELET
BASO%: 0.3 % (ref 0.0–2.0)
Basophils Absolute: 0 10*3/uL (ref 0.0–0.1)
EOS ABS: 0.2 10*3/uL (ref 0.0–0.5)
EOS%: 1.9 % (ref 0.0–7.0)
HEMATOCRIT: 38.4 % (ref 34.8–46.6)
HGB: 11.8 g/dL (ref 11.6–15.9)
LYMPH%: 8 % — ABNORMAL LOW (ref 14.0–49.7)
MCH: 20.4 pg — AB (ref 25.1–34.0)
MCHC: 30.7 g/dL — AB (ref 31.5–36.0)
MCV: 66.3 fL — AB (ref 79.5–101.0)
MONO#: 0.8 10*3/uL (ref 0.1–0.9)
MONO%: 7.2 % (ref 0.0–14.0)
NEUT#: 9.7 10*3/uL — ABNORMAL HIGH (ref 1.5–6.5)
NEUT%: 82.6 % — ABNORMAL HIGH (ref 38.4–76.8)
Platelets: 623 10*3/uL — ABNORMAL HIGH (ref 145–400)
RBC: 5.79 10*6/uL — AB (ref 3.70–5.45)
RDW: 17 % — ABNORMAL HIGH (ref 11.2–14.5)
WBC: 11.7 10*3/uL — ABNORMAL HIGH (ref 3.9–10.3)
lymph#: 0.9 10*3/uL (ref 0.9–3.3)

## 2014-02-11 ENCOUNTER — Telehealth: Payer: Self-pay | Admitting: Medical Oncology

## 2014-02-11 ENCOUNTER — Other Ambulatory Visit: Payer: Self-pay | Admitting: Medical Oncology

## 2014-02-11 MED ORDER — ANAGRELIDE HCL 0.5 MG PO CAPS: 0.5000 mg | ORAL_CAPSULE | Freq: Two times a day (BID) | ORAL | Status: DC

## 2014-02-11 NOTE — Telephone Encounter (Signed)
Mrs. Beckstrand called stating she is having trouble getting her anagrelide refilled. She states that her pharmacist has sent it twice to our office without a reply. She did not know who to call but she could remember my name. Pt was being seen by Dr. Lona Kettle and he is no longer with our practice. I informed her I would investigate and call her back. I reviewed her chart and has appointments for labs/phlebotomies in Feb.,April, and labs and MD in June. She has not been assigned a new provider. I spoke with Dr. Jana Hakim- on call MD and we reviewed her labs and notes. He would like for patient to continue her current dose of anagrelide 0.5mg  BID and continue appointment for labs/phlebotomies and MD. I called the patient back to inform her that the refills have been sent in to her pharmacy. I stressed the importance of keeping her appointments for labs. She asked who her new physician would be. I explained that she will get a call about a month before her June visit with her new physician. She voiced understanding. I asked her to call if any questions or concerns.

## 2014-02-19 ENCOUNTER — Other Ambulatory Visit: Payer: Self-pay | Admitting: Internal Medicine

## 2014-03-22 ENCOUNTER — Other Ambulatory Visit: Payer: Self-pay | Admitting: Hematology

## 2014-03-22 DIAGNOSIS — D751 Secondary polycythemia: Secondary | ICD-10-CM

## 2014-03-25 ENCOUNTER — Other Ambulatory Visit (HOSPITAL_BASED_OUTPATIENT_CLINIC_OR_DEPARTMENT_OTHER): Payer: Medicare Other

## 2014-03-25 ENCOUNTER — Ambulatory Visit (HOSPITAL_BASED_OUTPATIENT_CLINIC_OR_DEPARTMENT_OTHER): Payer: Medicare Other | Admitting: Hematology

## 2014-03-25 ENCOUNTER — Other Ambulatory Visit: Payer: Self-pay | Admitting: Hematology

## 2014-03-25 ENCOUNTER — Telehealth: Payer: Self-pay | Admitting: Hematology

## 2014-03-25 VITALS — BP 171/76 | HR 86 | Temp 97.9°F | Resp 18 | Ht 65.0 in | Wt 98.6 lb

## 2014-03-25 DIAGNOSIS — D751 Secondary polycythemia: Secondary | ICD-10-CM | POA: Diagnosis not present

## 2014-03-25 DIAGNOSIS — D473 Essential (hemorrhagic) thrombocythemia: Secondary | ICD-10-CM | POA: Diagnosis not present

## 2014-03-25 DIAGNOSIS — D45 Polycythemia vera: Secondary | ICD-10-CM

## 2014-03-25 DIAGNOSIS — Z853 Personal history of malignant neoplasm of breast: Secondary | ICD-10-CM

## 2014-03-25 DIAGNOSIS — C50919 Malignant neoplasm of unspecified site of unspecified female breast: Secondary | ICD-10-CM

## 2014-03-25 LAB — CBC WITH DIFFERENTIAL/PLATELET
BASO%: 0.7 % (ref 0.0–2.0)
Basophils Absolute: 0.1 10*3/uL (ref 0.0–0.1)
EOS ABS: 0.2 10*3/uL (ref 0.0–0.5)
EOS%: 1.7 % (ref 0.0–7.0)
HEMATOCRIT: 38.6 % (ref 34.8–46.6)
HEMOGLOBIN: 11.6 g/dL (ref 11.6–15.9)
LYMPH%: 6.5 % — AB (ref 14.0–49.7)
MCH: 19.5 pg — AB (ref 25.1–34.0)
MCHC: 30.2 g/dL — ABNORMAL LOW (ref 31.5–36.0)
MCV: 64.7 fL — ABNORMAL LOW (ref 79.5–101.0)
MONO#: 0.9 10*3/uL (ref 0.1–0.9)
MONO%: 7 % (ref 0.0–14.0)
NEUT%: 84.1 % — ABNORMAL HIGH (ref 38.4–76.8)
NEUTROS ABS: 10.3 10*3/uL — AB (ref 1.5–6.5)
PLATELETS: 586 10*3/uL — AB (ref 145–400)
RBC: 5.97 10*6/uL — AB (ref 3.70–5.45)
RDW: 19.3 % — AB (ref 11.2–14.5)
WBC: 12.2 10*3/uL — AB (ref 3.9–10.3)
lymph#: 0.8 10*3/uL — ABNORMAL LOW (ref 0.9–3.3)

## 2014-03-25 LAB — COMPREHENSIVE METABOLIC PANEL (CC13)
ALBUMIN: 3.7 g/dL (ref 3.5–5.0)
ALT: 12 U/L (ref 0–55)
ANION GAP: 9 meq/L (ref 3–11)
AST: 18 U/L (ref 5–34)
Alkaline Phosphatase: 72 U/L (ref 40–150)
BILIRUBIN TOTAL: 0.4 mg/dL (ref 0.20–1.20)
BUN: 15.7 mg/dL (ref 7.0–26.0)
CO2: 27 meq/L (ref 22–29)
CREATININE: 1.2 mg/dL — AB (ref 0.6–1.1)
Calcium: 9.9 mg/dL (ref 8.4–10.4)
Chloride: 98 mEq/L (ref 98–109)
EGFR: 40 mL/min/{1.73_m2} — AB (ref >90)
GLUCOSE: 99 mg/dL (ref 70–140)
Potassium: 4.6 mEq/L (ref 3.5–5.1)
SODIUM: 134 meq/L — AB (ref 136–145)
Total Protein: 6.4 g/dL (ref 6.4–8.3)

## 2014-03-25 NOTE — Telephone Encounter (Signed)
Gave avs & calendar for March. °

## 2014-03-25 NOTE — Progress Notes (Signed)
[facility] PROGRESS NOTE DATE OF VISIT: 12/03/2013  Kelly Medal, MD [address], [place] [address]  DIAGNOSIS: POLYCYTHEMIA  Malignant neoplasm of female breast, unspecified laterality, Breast Cancer  Chief Complaint  Patient presents with  . Follow-up    p.[name]    CURRENT THERAPY: Phlebotomy if hematocrit is greater than or equal to 42%. The patient receives 300 mL of normal saline following each 1-unit phlebotomy. Anagrelide 0.5 mg twice daily and aspirin 81 mg daily.    INTERVAL HISTORY:  [name] 81 y.o. female with a history of polycythemia [name] (04/21/2007) with positive JAK2 mutation is here for follow-up.  She was last seen by Dr [name] 3 month ago, who has left the practice. She feels well overall, no complains. She denies any chest pain, dyspnea or other discomfort. She has good energy and appetite.  MEDICAL HISTORY: Past Medical History  Diagnosis Date  . MVP (mitral valve prolapse)   . Hypertension   . Chest tightness   . Palpitation   . PVC's (premature ventricular contractions)   . Breast cancer     right  . Polycythemia   . Chronic gastritis   . Diverticulosis   . Candida esophagitis   . Arthritis     INTERIM HISTORY: has Malignant neoplasm of female breast; POLYCYTHEMIA; GASTRITIS, CHRONIC; DIVERTICULOSIS OF COLON; ARTHRITIS; ABDOMINAL PAIN, EPIGASTRIC; HTN (hypertension); and MVP (mitral valve prolapse) on her problem list.    ALLERGIES:  is allergic to sulfur.  MEDICATIONS: has a current medication list which includes the following prescription(s): amlodipine, anagrelide, aspirin, cetirizine hcl, vitamin d3, colestipol, dicyclomine, fluticasone, hydrochlorothiazide, lisinopril, loperamide, metoprolol succinate, multivitamin, pantoprazole, potassium chloride, and probiotic product.  SURGICAL HISTORY:  Past Surgical History  Procedure Laterality Date  . Tonsillectomy    . Appendectomy    .  Cardiovascular stress test  12/23/2000    EF 70%  . Transthoracic echocardiogram  03/05/2010    EF 55-60%  . Abdominal hysterectomy      ovaries taken  . Breast lumpectomy      right  . Elbow fracture surgery      left  . Cataract extraction      bilateral  . Hematoma evacuation      right   PROBLEM LIST:  1. Polycythemia [name] diagnosed in March 2009. JAK2 mutation was detected on 04/21/2007. Hemoglobin in April 2009 was 18.4 with hematocrit of 55.0. Platelet count on July 12, 2007, was 868,000. The patient has never had a bone marrow exam. She had been undergoing phlebotomies in the past, approximately once a month, for hematocrit greater than or equal to 45%. The patient has been on anagrelide 0.5 mg twice daily, as well as aspirin. She has not had any thrombotic complications. We will be carrying out a 1-unit phlebotomy whenever the hematocrit is greater than or equal to 42%. The patient does require normal saline 300 mL after phlebotomy because of symptomatic hypotension.  2. 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  Lumpectomy on 01/04/2006 followed by radiation treatments under the direction of Dr. Gery Rojas. The patient has not required any systemic therapy. She does have yearly mammograms.  3. Mitral valve prolapse.  4. Hypertension.  5. Diverticulosis.  6. History of esophageal stricture.  7. History of gastritis.  8. History of pelvic fractures after falling on August 23, 2006.  REVIEW OF SYSTEMS:   Constitutional: Denies fevers, chills or abnormal weight loss Eyes: Denies blurriness of vision  Ears, nose, mouth, throat, and face: Denies mucositis or sore throat Respiratory: Denies cough, dyspnea or wheezes Cardiovascular: Denies palpitation, chest discomfort or lower extremity swelling Gastrointestinal:  Denies nausea, heartburn or change in bowel habits Skin: Denies abnormal skin rashes Lymphatics: Denies new lymphadenopathy or easy  bruising Neurological:Denies numbness, tingling or new weaknesses Behavioral/Psych: Mood is stable, no new changes  All other systems were reviewed with the patient and are negative.  PHYSICAL EXAMINATION: ECOG PERFORMANCE STATUS: 0  Blood pressure 171/76, pulse 86, temperature 97.9 F (36.6 C), temperature source Oral, resp. rate 18, height 5\' 5"  (1.651 m), weight 98 lb 9.6 oz (44.725 kg), SpO2 98 %.  GENERAL:alert, no distress and comfortable; she has scoliosis and looks her stated age.  SKIN: skin color, texture, turgor are normal, no rashes but seborrheic keratosis.  EYES: normal, Conjunctiva are pink and non-injected, sclera clear OROPHARYNX:no exudate, no erythema and lips, buccal mucosa, and tongue normal  NECK: supple, thyroid normal size, non-tender, without nodularity LYMPH:  no palpable lymphadenopathy in the cervical, axillary or supraclavicular LUNGS: clear to auscultation and percussion with normal breathing effort HEART: regular rate & rhythm and no murmurs and no lower extremity edema ABDOMEN:abdomen soft, non-tender and normal bowel sounds Musculoskeletal:no cyanosis of digits and no clubbing  NEURO: alert & oriented x 3 with fluent speech, no focal motor/sensory deficits  LABORATORY DATA: CBC Latest Ref Rng 03/25/2014 02/06/2014 12/03/2013  WBC 3.9 - 10.3 10e3/uL 12.2(H) 11.7(H) 10.4(H)  Hemoglobin 11.6 - 15.9 g/dL 11.6 11.8 13.7  Hematocrit 34.8 - 46.6 % 38.6 38.4 43.8  Platelets 145 - 400 10e3/uL 586(H) 623(H) 539(H)    CMP Latest Ref Rng 03/25/2014 12/03/2013 08/01/2013  Glucose 70 - 140 mg/dl 99 107 98  BUN 7.0 - 26.0 mg/dL 15.7 19.4 18.2  Creatinine 0.6 - 1.1 mg/dL 1.2(H) 1.2(H) 1.3(H)  Sodium 136 - 145 mEq/L 134(L) 133(L) 134(L)  Potassium 3.5 - 5.1 mEq/L 4.6 4.5 5.0  Chloride 96 - 112 mEq/L - - -  CO2 22 - 29 mEq/L 27 26 27   Calcium 8.4 - 10.4 mg/dL 9.9 9.9 9.9  Total Protein 6.4 - 8.3 g/dL 6.4 6.8 6.5  Total Bilirubin 0.20 - 1.20 mg/dL 0.40 0.41 0.42   Alkaline Phos 40 - 150 U/L 72 76 67  AST 5 - 34 U/L 18 17 16   ALT 0 - 55 U/L 12 17 11        IMAGING STUDIES:  1. Ultrasound of the abdomen, limited, on 01/21/2006, showed a splenic volume of 73 cc with maximum ultrasound dimension of 8.8 cm. Spleen measured 8.8 x 4.1 x 3.9 cm, for a volume of 73 cc. No focal lesions were identified.  2. Diagnostic bilateral digital mammogram carried out at [facility] on 01/01/2011 showed no suspicious findings.  3. Mammogram, diagnostic, bilateral on 01/04/2012 showed no significant abnormalities. There were post lumpectomy changes present involving the right breast.  ASSESSMENT: Filbert Berthold Montesdeoca 81 y.o. female with a history of POLYCYTHEMIA  Malignant neoplasm of female breast, unspecified laterality   PLAN:  1. Polycythemia [name]. --The patient is doing well clinically. - her CBC today showed hemoglobin 11.6, her platelet count has been around 600K. I recommend to increase her anagrelide to better control her thrombocytosis, and reduce her risk of thrombosis.she agrees -We discussed this is a incurable disease, most people have indolent and benign course, the major complication is thrombosis. But some patients will develop leukemia and myelofibrosis at the end.  2. Thrombocytosis secondary to #  1. --I suggest her to increase anagrelide to 1.5 mg in the morning and 1 mg in the evening  3. DCIS  --Involving the right breast with biopsy on 11/29/2005.  Lumpectomy on 01/04/2006 followed by radiation treatments under the direction of Dr. Gery Rojas.  Her mammogram on 01/03/2013 was negative.    Plan -increase anagrilide to 1mg  am and 0.5mg  pm  -RTC in 4 weeks, and CBC in 2 and 4 weeks   All questions were answered. The patient knows to call the clinic with any problems, questions or concerns. We can certainly see the patient much sooner if necessary.  Patient was provided a handout on polycythemia [name].   I spent 20 minutes counseling  the patient face to face. The total time spent in the appointment was 25 minutes.   Truitt Merle  03/25/2014

## 2014-03-26 ENCOUNTER — Encounter: Payer: Self-pay | Admitting: Hematology

## 2014-03-27 ENCOUNTER — Encounter: Payer: Self-pay | Admitting: Hematology

## 2014-04-08 ENCOUNTER — Other Ambulatory Visit (HOSPITAL_BASED_OUTPATIENT_CLINIC_OR_DEPARTMENT_OTHER): Payer: Medicare Other

## 2014-04-08 DIAGNOSIS — D45 Polycythemia vera: Secondary | ICD-10-CM

## 2014-04-08 DIAGNOSIS — D751 Secondary polycythemia: Secondary | ICD-10-CM

## 2014-04-08 LAB — CBC WITH DIFFERENTIAL/PLATELET
BASO%: 0.3 % (ref 0.0–2.0)
BASOS ABS: 0 10*3/uL (ref 0.0–0.1)
EOS%: 1.6 % (ref 0.0–7.0)
Eosinophils Absolute: 0.2 10*3/uL (ref 0.0–0.5)
HEMATOCRIT: 40 % (ref 34.8–46.6)
HEMOGLOBIN: 12 g/dL (ref 11.6–15.9)
LYMPH#: 0.7 10*3/uL — AB (ref 0.9–3.3)
LYMPH%: 6.6 % — ABNORMAL LOW (ref 14.0–49.7)
MCH: 19.9 pg — ABNORMAL LOW (ref 25.1–34.0)
MCHC: 30 g/dL — ABNORMAL LOW (ref 31.5–36.0)
MCV: 66.4 fL — ABNORMAL LOW (ref 79.5–101.0)
MONO#: 0.6 10*3/uL (ref 0.1–0.9)
MONO%: 6.2 % (ref 0.0–14.0)
NEUT#: 8.6 10*3/uL — ABNORMAL HIGH (ref 1.5–6.5)
NEUT%: 85.3 % — AB (ref 38.4–76.8)
Platelets: 429 10*3/uL — ABNORMAL HIGH (ref 145–400)
RBC: 6.02 10*6/uL — ABNORMAL HIGH (ref 3.70–5.45)
RDW: 19.3 % — ABNORMAL HIGH (ref 11.2–14.5)
WBC: 10 10*3/uL (ref 3.9–10.3)

## 2014-04-22 ENCOUNTER — Telehealth: Payer: Self-pay | Admitting: Hematology

## 2014-04-22 ENCOUNTER — Ambulatory Visit (HOSPITAL_COMMUNITY)
Admission: RE | Admit: 2014-04-22 | Discharge: 2014-04-22 | Disposition: A | Discharge status: Home / Self Care | Payer: Medicare Other | Source: Ambulatory Visit | Attending: Hematology | Admitting: Hematology

## 2014-04-22 ENCOUNTER — Other Ambulatory Visit: Payer: Self-pay | Admitting: Hematology

## 2014-04-22 ENCOUNTER — Other Ambulatory Visit (HOSPITAL_BASED_OUTPATIENT_CLINIC_OR_DEPARTMENT_OTHER): Payer: Medicare Other

## 2014-04-22 ENCOUNTER — Ambulatory Visit (HOSPITAL_BASED_OUTPATIENT_CLINIC_OR_DEPARTMENT_OTHER): Payer: Medicare Other | Admitting: Hematology

## 2014-04-22 ENCOUNTER — Encounter: Payer: Self-pay | Admitting: Hematology

## 2014-04-22 ENCOUNTER — Telehealth: Payer: Self-pay | Admitting: *Deleted

## 2014-04-22 VITALS — BP 166/77 | HR 90 | Temp 97.0°F | Resp 18 | Ht 65.0 in | Wt 97.7 lb

## 2014-04-22 DIAGNOSIS — R102 Pelvic and perineal pain: Secondary | ICD-10-CM

## 2014-04-22 DIAGNOSIS — M25551 Pain in right hip: Secondary | ICD-10-CM | POA: Diagnosis not present

## 2014-04-22 DIAGNOSIS — W19XXXA Unspecified fall, initial encounter: Secondary | ICD-10-CM | POA: Insufficient documentation

## 2014-04-22 DIAGNOSIS — M545 Low back pain: Secondary | ICD-10-CM | POA: Diagnosis not present

## 2014-04-22 DIAGNOSIS — D751 Secondary polycythemia: Secondary | ICD-10-CM

## 2014-04-22 DIAGNOSIS — D45 Polycythemia vera: Secondary | ICD-10-CM

## 2014-04-22 DIAGNOSIS — M791 Myalgia: Secondary | ICD-10-CM | POA: Diagnosis not present

## 2014-04-22 DIAGNOSIS — Z853 Personal history of malignant neoplasm of breast: Secondary | ICD-10-CM

## 2014-04-22 DIAGNOSIS — D473 Essential (hemorrhagic) thrombocythemia: Secondary | ICD-10-CM | POA: Diagnosis not present

## 2014-04-22 LAB — CBC WITH DIFFERENTIAL/PLATELET
BASO%: 0.3 % (ref 0.0–2.0)
Basophils Absolute: 0 10*3/uL (ref 0.0–0.1)
EOS%: 0.8 % (ref 0.0–7.0)
Eosinophils Absolute: 0.1 10*3/uL (ref 0.0–0.5)
HCT: 41.2 % (ref 34.8–46.6)
HEMOGLOBIN: 12.4 g/dL (ref 11.6–15.9)
LYMPH%: 4.5 % — AB (ref 14.0–49.7)
MCH: 19.6 pg — ABNORMAL LOW (ref 25.1–34.0)
MCHC: 30.2 g/dL — ABNORMAL LOW (ref 31.5–36.0)
MCV: 65.2 fL — AB (ref 79.5–101.0)
MONO#: 0.9 10*3/uL (ref 0.1–0.9)
MONO%: 6.4 % (ref 0.0–14.0)
NEUT#: 12.1 10*3/uL — ABNORMAL HIGH (ref 1.5–6.5)
NEUT%: 88 % — AB (ref 38.4–76.8)
Platelets: 586 10*3/uL — ABNORMAL HIGH (ref 145–400)
RBC: 6.33 10*6/uL — AB (ref 3.70–5.45)
RDW: 19.9 % — ABNORMAL HIGH (ref 11.2–14.5)
WBC: 13.8 10*3/uL — ABNORMAL HIGH (ref 3.9–10.3)
lymph#: 0.6 10*3/uL — ABNORMAL LOW (ref 0.9–3.3)

## 2014-04-22 MED ORDER — CYCLOBENZAPRINE HCL 5 MG PO TABS: 5.0000 mg | ORAL_TABLET | Freq: Three times a day (TID) | ORAL | Status: DC | PRN

## 2014-04-22 NOTE — Telephone Encounter (Signed)
Spoke with pt at home and informed pt re:  Dr. Burr Medico reviewed xray results done today.  No acute abnormality per xray report.  Informed pt that a prescription for Flexeril was sent to pt's pharmacy by md. Pt voiced understanding.

## 2014-04-22 NOTE — Progress Notes (Signed)
[facility] PROGRESS NOTE DATE OF VISIT: 12/03/2013  [name], MD [address], [address] [place] [address]  DIAGNOSIS: POLYCYTHEMIA - Plan: DG Pelvis Comp Min 3V, Breast Cancer   CURRENT THERAPY: Phlebotomy if hematocrit is greater than or equal to 42%. The patient receives 300 mL of normal saline following each 1-unit phlebotomy. Anagrelide 0.5 mg twice daily and aspirin 81 mg daily.    INTERVAL HISTORY:  [name] 81 y.o. female with a history of polycythemia vera (04/21/2007) with positive JAK2 mutation is here for follow-up.  She tripped and fell down on her right side at home one week ago. She developed moderate pain at the right side pelvis and right hip. She is still able to move around and do dated activities without limitation, and she takes Aleve for the pain. She otherwise feels well no other new complaints. She did increase anagrelide 1 months ago.  MEDICAL HISTORY: Past Medical History  Diagnosis Date  . MVP (mitral valve prolapse)   . Hypertension   . Chest tightness   . Palpitation   . PVC's (premature ventricular contractions)   . Breast cancer     right  . Polycythemia   . Chronic gastritis   . Diverticulosis   . Candida esophagitis   . Arthritis     INTERIM HISTORY: has Malignant neoplasm of female breast; POLYCYTHEMIA; GASTRITIS, CHRONIC; DIVERTICULOSIS OF COLON; ARTHRITIS; ABDOMINAL PAIN, EPIGASTRIC; HTN (hypertension); and MVP (mitral valve prolapse) on her problem list.    ALLERGIES:  is allergic to sulfur.  MEDICATIONS: has a current medication list which includes the following prescription(s): amlodipine, anagrelide, aspirin, cetirizine hcl, vitamin d3, colestipol, dicyclomine, fluticasone, hydrochlorothiazide, lisinopril, loperamide, metoprolol succinate, multivitamin, pantoprazole, potassium chloride, probiotic product, and cyclobenzaprine.  SURGICAL HISTORY:  Past Surgical History  Procedure  Laterality Date  . Tonsillectomy    . Appendectomy    . Cardiovascular stress test  12/23/2000    EF 70%  . Transthoracic echocardiogram  03/05/2010    EF 55-60%  . Abdominal hysterectomy      ovaries taken  . Breast lumpectomy      right  . Elbow fracture surgery      left  . Cataract extraction      bilateral  . Hematoma evacuation      right   PROBLEM LIST:  1. Polycythemia vera diagnosed in March 2009. JAK2 mutation was detected on 04/21/2007. Hemoglobin in April 2009 was 18.4 with hematocrit of 55.0. Platelet count on July 12, 2007, was 868,000. The patient has never had a bone marrow exam. She had been undergoing phlebotomies in the past, approximately once a month, for hematocrit greater than or equal to 45%. The patient has been on anagrelide 0.5 mg twice daily, as well as aspirin. She has not had any thrombotic complications. We will be carrying out a 1-unit phlebotomy whenever the hematocrit is greater than or equal to 42%. The patient does require normal saline 300 mL after phlebotomy because of symptomatic hypotension.  2. 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  Lumpectomy on 01/04/2006 followed by radiation treatments under the direction of Dr. Gery Pray. The patient has not required any systemic therapy. She does have yearly mammograms.  3. Mitral valve prolapse.  4. Hypertension.  5. Diverticulosis.  6. History of esophageal stricture.  7. History of gastritis.  8. History of pelvic fractures after falling on August 23, 2006.  REVIEW OF SYSTEMS:   Constitutional: Denies fevers,  chills or abnormal weight loss Eyes: Denies blurriness of vision Ears, nose, mouth, throat, and face: Denies mucositis or sore throat Respiratory: Denies cough, dyspnea or wheezes Cardiovascular: Denies palpitation, chest discomfort or lower extremity swelling Gastrointestinal:  Denies nausea, heartburn or change in bowel habits Skin: Denies abnormal skin  rashes Lymphatics: Denies new lymphadenopathy or easy bruising Neurological:Denies numbness, tingling or new weaknesses Behavioral/Psych: Mood is stable, no new changes  All other systems were reviewed with the patient and are negative.  PHYSICAL EXAMINATION: ECOG PERFORMANCE STATUS: 0  Blood pressure 166/77, pulse 90, temperature 97 F (36.1 C), temperature source Oral, resp. rate 18, height 5\' 5"  (1.651 m), weight 97 lb 11.2 oz (44.316 kg), SpO2 97 %.  GENERAL:alert, no distress and comfortable; she has scoliosis and looks her stated age.  SKIN: skin color, texture, turgor are normal, no rashes but seborrheic keratosis.  EYES: normal, Conjunctiva are pink and non-injected, sclera clear OROPHARYNX:no exudate, no erythema and lips, buccal mucosa, and tongue normal  NECK: supple, thyroid normal size, non-tender, without nodularity LYMPH:  no palpable lymphadenopathy in the cervical, axillary or supraclavicular LUNGS: clear to auscultation and percussion with normal breathing effort HEART: regular rate & rhythm and no murmurs and no lower extremity edema ABDOMEN:abdomen soft, non-tender and normal bowel sounds Musculoskeletal: (+) Mild tenderness at the right hip and pelvic area. The range of motion of right hip is normal. NEURO: alert & oriented x 3 with fluent speech, no focal motor/sensory deficits  LABORATORY DATA: CBC Latest Ref Rng 04/22/2014 04/08/2014 03/25/2014  WBC 3.9 - 10.3 10e3/uL 13.8(H) 10.0 12.2(H)  Hemoglobin 11.6 - 15.9 g/dL 12.4 12.0 11.6  Hematocrit 34.8 - 46.6 % 41.2 40.0 38.6  Platelets 145 - 400 10e3/uL 586(H) 429(H) 586(H)    CMP Latest Ref Rng 03/25/2014 12/03/2013 08/01/2013  Glucose 70 - 140 mg/dl 99 107 98  BUN 7.0 - 26.0 mg/dL 15.7 19.4 18.2  Creatinine 0.6 - 1.1 mg/dL 1.2(H) 1.2(H) 1.3(H)  Sodium 136 - 145 mEq/L 134(L) 133(L) 134(L)  Potassium 3.5 - 5.1 mEq/L 4.6 4.5 5.0  Chloride 96 - 112 mEq/L - - -  CO2 22 - 29 mEq/L 27 26 27   Calcium 8.4 - 10.4 mg/dL  9.9 9.9 9.9  Total Protein 6.4 - 8.3 g/dL 6.4 6.8 6.5  Total Bilirubin 0.20 - 1.20 mg/dL 0.40 0.41 0.42  Alkaline Phos 40 - 150 U/L 72 76 67  AST 5 - 34 U/L 18 17 16   ALT 0 - 55 U/L 12 17 11        IMAGING STUDIES:  1. Ultrasound of the abdomen, limited, on 01/21/2006, showed a splenic volume of 73 cc with maximum ultrasound dimension of 8.8 cm. Spleen measured 8.8 x 4.1 x 3.9 cm, for a volume of 73 cc. No focal lesions were identified.  2. Diagnostic bilateral digital mammogram carried out at [facility] on 01/01/2011 showed no suspicious findings.  3. Mammogram, diagnostic, bilateral on 01/04/2012 showed no significant abnormalities. There were post lumpectomy changes present involving the right breast.  ASSESSMENT: [name] 81 y.o. female with a history of POLYCYTHEMIA - Plan: DG Pelvis Comp Min 3V   PLAN:  1. Polycythemia vera. --The patient is doing well clinically. - she increased her anagrelide we'll months ago, her platelet count was down to 429 2 weeks ago. Her platelet count is up to 589 again today, possibly related to her recent fall and information.  -We'll continue and chloride at 1 mg in the morning and  0.5 mg in the evening, and watch her CBC monthly. -We discussed this is a incurable disease, most people have indolent and benign course, the major complication is thrombosis. But some patients will develop leukemia and myelofibrosis at the end.  2. Thrombocytosis secondary to # 1. --She will continue anagrelide 1.5 mg in the morning and 1 mg in the evening  3. DCIS  --Involving the right breast with biopsy on 11/29/2005.  Lumpectomy on 01/04/2006 followed by radiation treatments under the direction of Dr. Gery Pray.  Her mammogram on 01/03/2013 was negative.   4. Right pelvic and hip pain after fall -I'll obtain a pelvic x-ray to ruled out a pelvic fracture. I'll call her the x-ray results later today. -I give her a prescription of Flexeril for  muscular pain.  Plan -CBC monthly, return to clinic in 2 months.  All questions were answered. The patient knows to call the clinic with any problems, questions or concerns. We can certainly see the patient much sooner if necessary.  Patient was provided a handout on polycythemia vera.   I spent 20 minutes counseling the patient face to face. The total time spent in the appointment was 25 minutes.   Truitt Merle  04/22/2014

## 2014-04-22 NOTE — Telephone Encounter (Signed)
gv and rpitned appt sched and avs for pt for April and May

## 2014-05-01 DIAGNOSIS — M549 Dorsalgia, unspecified: Secondary | ICD-10-CM | POA: Diagnosis not present

## 2014-05-01 DIAGNOSIS — M25551 Pain in right hip: Secondary | ICD-10-CM | POA: Diagnosis not present

## 2014-05-01 DIAGNOSIS — M47816 Spondylosis without myelopathy or radiculopathy, lumbar region: Secondary | ICD-10-CM | POA: Diagnosis not present

## 2014-05-01 DIAGNOSIS — M1611 Unilateral primary osteoarthritis, right hip: Secondary | ICD-10-CM | POA: Diagnosis not present

## 2014-05-01 DIAGNOSIS — M810 Age-related osteoporosis without current pathological fracture: Secondary | ICD-10-CM | POA: Diagnosis not present

## 2014-05-06 ENCOUNTER — Other Ambulatory Visit: Payer: Self-pay | Admitting: Internal Medicine

## 2014-05-06 DIAGNOSIS — M545 Low back pain: Secondary | ICD-10-CM

## 2014-05-16 ENCOUNTER — Other Ambulatory Visit: Payer: Self-pay | Admitting: Hematology

## 2014-05-16 DIAGNOSIS — D751 Secondary polycythemia: Secondary | ICD-10-CM

## 2014-05-16 MED ORDER — ANAGRELIDE HCL 0.5 MG PO CAPS: ORAL_CAPSULE | ORAL | Status: DC

## 2014-05-17 ENCOUNTER — Ambulatory Visit
Admission: RE | Admit: 2014-05-17 | Discharge: 2014-05-17 | Disposition: A | Discharge status: Home / Self Care | Payer: Medicare Other | Source: Ambulatory Visit | Attending: Internal Medicine | Admitting: Internal Medicine

## 2014-05-17 DIAGNOSIS — S32020A Wedge compression fracture of second lumbar vertebra, initial encounter for closed fracture: Secondary | ICD-10-CM | POA: Diagnosis not present

## 2014-05-17 DIAGNOSIS — M545 Low back pain: Secondary | ICD-10-CM

## 2014-05-17 DIAGNOSIS — S32010A Wedge compression fracture of first lumbar vertebra, initial encounter for closed fracture: Secondary | ICD-10-CM | POA: Diagnosis not present

## 2014-05-20 ENCOUNTER — Ambulatory Visit (HOSPITAL_BASED_OUTPATIENT_CLINIC_OR_DEPARTMENT_OTHER): Payer: Medicare Other

## 2014-05-20 ENCOUNTER — Other Ambulatory Visit (HOSPITAL_BASED_OUTPATIENT_CLINIC_OR_DEPARTMENT_OTHER): Payer: Medicare Other

## 2014-05-20 ENCOUNTER — Other Ambulatory Visit: Payer: Self-pay | Admitting: Hematology

## 2014-05-20 VITALS — BP 165/63 | HR 76 | Temp 98.0°F | Resp 18

## 2014-05-20 DIAGNOSIS — D45 Polycythemia vera: Secondary | ICD-10-CM | POA: Diagnosis present

## 2014-05-20 DIAGNOSIS — D473 Essential (hemorrhagic) thrombocythemia: Secondary | ICD-10-CM | POA: Diagnosis not present

## 2014-05-20 DIAGNOSIS — D751 Secondary polycythemia: Secondary | ICD-10-CM

## 2014-05-20 LAB — CBC WITH DIFFERENTIAL/PLATELET
BASO%: 0.1 % (ref 0.0–2.0)
BASOS ABS: 0 10*3/uL (ref 0.0–0.1)
EOS%: 1.9 % (ref 0.0–7.0)
Eosinophils Absolute: 0.2 10*3/uL (ref 0.0–0.5)
HEMATOCRIT: 43.3 % (ref 34.8–46.6)
HEMOGLOBIN: 13.3 g/dL (ref 11.6–15.9)
LYMPH#: 0.6 10*3/uL — AB (ref 0.9–3.3)
LYMPH%: 6.7 % — ABNORMAL LOW (ref 14.0–49.7)
MCH: 20.5 pg — ABNORMAL LOW (ref 25.1–34.0)
MCHC: 30.7 g/dL — AB (ref 31.5–36.0)
MCV: 66.7 fL — ABNORMAL LOW (ref 79.5–101.0)
MONO#: 0.7 10*3/uL (ref 0.1–0.9)
MONO%: 8 % (ref 0.0–14.0)
NEUT#: 7 10*3/uL — ABNORMAL HIGH (ref 1.5–6.5)
NEUT%: 83.3 % — ABNORMAL HIGH (ref 38.4–76.8)
Platelets: 414 10*3/uL — ABNORMAL HIGH (ref 145–400)
RBC: 6.49 10*6/uL — ABNORMAL HIGH (ref 3.70–5.45)
RDW: 18.3 % — ABNORMAL HIGH (ref 11.2–14.5)
WBC: 8.5 10*3/uL (ref 3.9–10.3)

## 2014-05-20 MED ORDER — SODIUM CHLORIDE 0.9 % IV SOLN
250.0000 mL | Freq: Once | INTRAVENOUS | Status: AC
  Administered 2014-05-20: 250 mL via INTRAVENOUS

## 2014-05-20 NOTE — Progress Notes (Signed)
Per Dr. Burr Medico therapeutic phlebotomy for 1/2 unit performed through IV site to Right AC. 250 grams removed without difficulty over approx 15 minutes. Pt tolerated the procedure without difficulty.  250 ml of NS administered over 30 min per Dr. Burr Medico after phlebotomy. Pt eating snack and had breakfast prior to phlebotomy.

## 2014-05-20 NOTE — Progress Notes (Signed)
1055: Pt discharged home in no apparent distress. Ambulatory without complaints.  Vital signs stable.

## 2014-05-20 NOTE — Patient Instructions (Signed)

## 2014-05-23 DIAGNOSIS — M62838 Other muscle spasm: Secondary | ICD-10-CM | POA: Diagnosis not present

## 2014-05-30 ENCOUNTER — Other Ambulatory Visit: Payer: Self-pay | Admitting: Cardiovascular Disease

## 2014-06-13 DIAGNOSIS — M8580 Other specified disorders of bone density and structure, unspecified site: Secondary | ICD-10-CM | POA: Diagnosis not present

## 2014-06-13 DIAGNOSIS — I10 Essential (primary) hypertension: Secondary | ICD-10-CM | POA: Diagnosis not present

## 2014-06-13 DIAGNOSIS — E559 Vitamin D deficiency, unspecified: Secondary | ICD-10-CM | POA: Diagnosis not present

## 2014-06-17 ENCOUNTER — Encounter: Payer: Self-pay | Admitting: Hematology

## 2014-06-17 ENCOUNTER — Other Ambulatory Visit (HOSPITAL_BASED_OUTPATIENT_CLINIC_OR_DEPARTMENT_OTHER): Payer: Medicare Other

## 2014-06-17 ENCOUNTER — Ambulatory Visit (HOSPITAL_BASED_OUTPATIENT_CLINIC_OR_DEPARTMENT_OTHER): Payer: Medicare Other | Admitting: Hematology

## 2014-06-17 ENCOUNTER — Telehealth: Payer: Self-pay | Admitting: *Deleted

## 2014-06-17 ENCOUNTER — Telehealth: Payer: Self-pay | Admitting: Hematology

## 2014-06-17 VITALS — BP 168/95 | HR 70 | Resp 17 | Ht 65.0 in | Wt 94.4 lb

## 2014-06-17 DIAGNOSIS — D473 Essential (hemorrhagic) thrombocythemia: Secondary | ICD-10-CM

## 2014-06-17 DIAGNOSIS — Z853 Personal history of malignant neoplasm of breast: Secondary | ICD-10-CM

## 2014-06-17 DIAGNOSIS — M4856XA Collapsed vertebra, not elsewhere classified, lumbar region, initial encounter for fracture: Secondary | ICD-10-CM | POA: Diagnosis not present

## 2014-06-17 DIAGNOSIS — C50919 Malignant neoplasm of unspecified site of unspecified female breast: Secondary | ICD-10-CM

## 2014-06-17 DIAGNOSIS — D751 Secondary polycythemia: Secondary | ICD-10-CM

## 2014-06-17 LAB — CBC WITH DIFFERENTIAL/PLATELET
BASO%: 0.4 % (ref 0.0–2.0)
Basophils Absolute: 0 10*3/uL (ref 0.0–0.1)
EOS ABS: 0.2 10*3/uL (ref 0.0–0.5)
EOS%: 1.6 % (ref 0.0–7.0)
HEMATOCRIT: 40.2 % (ref 34.8–46.6)
HEMOGLOBIN: 12.3 g/dL (ref 11.6–15.9)
LYMPH%: 7.1 % — AB (ref 14.0–49.7)
MCH: 19.9 pg — ABNORMAL LOW (ref 25.1–34.0)
MCHC: 30.6 g/dL — ABNORMAL LOW (ref 31.5–36.0)
MCV: 64.9 fL — ABNORMAL LOW (ref 79.5–101.0)
MONO#: 0.8 10*3/uL (ref 0.1–0.9)
MONO%: 6.9 % (ref 0.0–14.0)
NEUT#: 9.4 10*3/uL — ABNORMAL HIGH (ref 1.5–6.5)
NEUT%: 84 % — ABNORMAL HIGH (ref 38.4–76.8)
Platelets: 434 10*3/uL — ABNORMAL HIGH (ref 145–400)
RBC: 6.19 10*6/uL — ABNORMAL HIGH (ref 3.70–5.45)
RDW: 18.4 % — ABNORMAL HIGH (ref 11.2–14.5)
WBC: 11.1 10*3/uL — ABNORMAL HIGH (ref 3.9–10.3)
lymph#: 0.8 10*3/uL — ABNORMAL LOW (ref 0.9–3.3)

## 2014-06-17 NOTE — Telephone Encounter (Signed)
Per staff message and POF I have scheduled appts. Advised scheduler of appts. JMW  

## 2014-06-17 NOTE — Progress Notes (Signed)
[facility] PROGRESS NOTE DATE OF VISIT: 12/03/2013  [name], MD [address] [place] [address]  DIAGNOSIS: POLYCYTHEMIA  Malignant neoplasm of female breast, unspecified laterality, Breast Cancer   CURRENT THERAPY: Phlebotomy if hematocrit is greater than or equal to 42% (changed to 45% in May 2016). The patient receives 300 mL of normal saline following each 1-unit phlebotomy. Anagrelide 0.5 mg twice daily and aspirin 81 mg daily, anagrelide increased to 1.0mg  AM and 0.5mg  PM since Feb 2016   INTERVAL HISTORY:  [name] 81 y.o. female with a history of polycythemia vera (04/21/2007) with positive JAK2 mutation is here for follow-up.  She had lumbar MRI done on 05/17/2014, which showed compression fracture of L2. She still has low back pain, but better than 2 months ago. She is able to move around. No significant leg weakness. She otherwise feels well, no other new complaints.  MEDICAL HISTORY: Past Medical History  Diagnosis Date  . MVP (mitral valve prolapse)   . Hypertension   . Chest tightness   . Palpitation   . PVC's (premature ventricular contractions)   . Breast cancer     right  . Polycythemia   . Chronic gastritis   . Diverticulosis   . Candida esophagitis   . Arthritis     INTERIM HISTORY: has Malignant neoplasm of female breast; POLYCYTHEMIA; GASTRITIS, CHRONIC; DIVERTICULOSIS OF COLON; ARTHRITIS; ABDOMINAL PAIN, EPIGASTRIC; HTN (hypertension); and MVP (mitral valve prolapse) on her problem list.    ALLERGIES:  is allergic to sulfur.  MEDICATIONS: has a current medication list which includes the following prescription(s): amlodipine, anagrelide, aspirin, cetirizine hcl, vitamin d3, colestipol, cyclobenzaprine, dicyclomine, fluticasone, hydrochlorothiazide, ibandronate, lisinopril, loperamide, meloxicam, metoprolol succinate, multivitamin, pantoprazole, potassium chloride, and probiotic  product.  SURGICAL HISTORY:  Past Surgical History  Procedure Laterality Date  . Tonsillectomy    . Appendectomy    . Cardiovascular stress test  12/23/2000    EF 70%  . Transthoracic echocardiogram  03/05/2010    EF 55-60%  . Abdominal hysterectomy      ovaries taken  . Breast lumpectomy      right  . Elbow fracture surgery      left  . Cataract extraction      bilateral  . Hematoma evacuation      right   PROBLEM LIST:  1. Polycythemia vera diagnosed in March 2009. JAK2 mutation was detected on 04/21/2007. Hemoglobin in April 2009 was 18.4 with hematocrit of 55.0. Platelet count on July 12, 2007, was 868,000. The patient has never had a bone marrow exam. She had been undergoing phlebotomies in the past, approximately once a month, for hematocrit greater than or equal to 45%. The patient has been on anagrelide 0.5 mg twice daily, as well as aspirin. She has not had any thrombotic complications. We will be carrying out a 1-unit phlebotomy whenever the hematocrit is greater than or equal to 42%. The patient does require normal saline 300 mL after phlebotomy because of symptomatic hypotension.  2. 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  Lumpectomy on 01/04/2006 followed by radiation treatments under the direction of Dr. Gery Pray. The patient has not required any systemic therapy. She does have yearly mammograms.  3. Mitral valve prolapse.  4. Hypertension.  5. Diverticulosis.  6. History of esophageal stricture.  7. History of gastritis.  8. History of pelvic fractures after falling on August 23, 2006.  REVIEW OF SYSTEMS:   Constitutional: Denies  fevers, chills or abnormal weight loss Eyes: Denies blurriness of vision Ears, nose, mouth, throat, and face: Denies mucositis or sore throat Respiratory: Denies cough, dyspnea or wheezes Cardiovascular: Denies palpitation, chest discomfort or lower extremity swelling Gastrointestinal:  Denies nausea, heartburn or  change in bowel habits Skin: Denies abnormal skin rashes Lymphatics: Denies new lymphadenopathy or easy bruising Neurological:Denies numbness, tingling or new weaknesses Behavioral/Psych: Mood is stable, no new changes  All other systems were reviewed with the patient and are negative.  PHYSICAL EXAMINATION: ECOG PERFORMANCE STATUS: 0  Blood pressure 168/95, pulse 70, resp. rate 17, height 5\' 5"  (1.651 m), weight 94 lb 6.4 oz (42.82 kg), SpO2 99 %.  GENERAL:alert, no distress and comfortable; she has scoliosis and looks her stated age.  SKIN: skin color, texture, turgor are normal, no rashes but seborrheic keratosis.  EYES: normal, Conjunctiva are pink and non-injected, sclera clear OROPHARYNX:no exudate, no erythema and lips, buccal mucosa, and tongue normal  NECK: supple, thyroid normal size, non-tender, without nodularity LYMPH:  no palpable lymphadenopathy in the cervical, axillary or supraclavicular LUNGS: clear to auscultation and percussion with normal breathing effort HEART: regular rate & rhythm and no murmurs and no lower extremity edema ABDOMEN:abdomen soft, non-tender and normal bowel sounds Musculoskeletal: (+) Mild tenderness at the right hip and pelvic area. The range of motion of right hip is normal. NEURO: alert & oriented x 3 with fluent speech, no focal motor/sensory deficits  LABORATORY DATA: CBC Latest Ref Rng 06/17/2014 05/20/2014 04/22/2014  WBC 3.9 - 10.3 10e3/uL 11.1(H) 8.5 13.8(H)  Hemoglobin 11.6 - 15.9 g/dL 12.3 13.3 12.4  Hematocrit 34.8 - 46.6 % 40.2 43.3 41.2  Platelets 145 - 400 10e3/uL 434(H) 414(H) 586(H)    CMP Latest Ref Rng 03/25/2014 12/03/2013 08/01/2013  Glucose 70 - 140 mg/dl 99 107 98  BUN 7.0 - 26.0 mg/dL 15.7 19.4 18.2  Creatinine 0.6 - 1.1 mg/dL 1.2(H) 1.2(H) 1.3(H)  Sodium 136 - 145 mEq/L 134(L) 133(L) 134(L)  Potassium 3.5 - 5.1 mEq/L 4.6 4.5 5.0  Chloride 96 - 112 mEq/L - - -  CO2 22 - 29 mEq/L 27 26 27   Calcium 8.4 - 10.4 mg/dL 9.9 9.9  9.9  Total Protein 6.4 - 8.3 g/dL 6.4 6.8 6.5  Total Bilirubin 0.20 - 1.20 mg/dL 0.40 0.41 0.42  Alkaline Phos 40 - 150 U/L 72 76 67  AST 5 - 34 U/L 18 17 16   ALT 0 - 55 U/L 12 17 11        IMAGING STUDIES:  1. Ultrasound of the abdomen, limited, on 01/21/2006, showed a splenic volume of 73 cc with maximum ultrasound dimension of 8.8 cm. Spleen measured 8.8 x 4.1 x 3.9 cm, for a volume of 73 cc. No focal lesions were identified.  2. Diagnostic bilateral digital mammogram carried out at [facility] on 01/01/2011 showed no suspicious findings.  3. Mammogram, diagnostic, bilateral on 01/04/2012 showed no significant abnormalities. There were post lumpectomy changes present involving the right breast.   ASSESSMENT: [name] 81 y.o. female with a history of POLYCYTHEMIA  Malignant neoplasm of female breast, unspecified laterality   PLAN:  1. Polycythemia vera. --The patient is doing well clinically. - she increased her anagrelide in 03/2014, platelet count has been gradually coming down, 434K today.   -We'll continue and chloride at 1 mg in the morning and 0.5 mg in the evening, and watch her CBC closely. -We discussed this is a incurable disease, most people have indolent and benign  course, the major complication is thrombosis. Some patients may develop leukemia and myelofibrosis at the end.  2. Thrombocytosis secondary to # 1. --She will continue anagrelide 1.0 mg in the morning and 0.5 mg in the evening  3. DCIS  --Involving the right breast with biopsy on 11/29/2005.  Lumpectomy on 01/04/2006 followed by radiation treatments under the direction of Dr. Gery Pray.  Her mammogram on 01/03/2013 was negative.   4. L2 compression fracture in 04/2014 -Her pain is better -Continue supportive care and symptom management by her primary care physician  Plan -CBC monthly and, return to clinic in 3 months. Consider phlebotomy with half unit and normal saline infusion if her  hematocrit above 45%. I'll schedule her phlebotomy in June and next visit.  All questions were answered. The patient knows to call the clinic with any problems, questions or concerns. We can certainly see the patient much sooner if necessary.  Patient was provided a handout on polycythemia vera.   I spent 20 minutes counseling the patient face to face. The total time spent in the appointment was 25 minutes.   Truitt Merle  06/17/2014

## 2014-06-17 NOTE — Telephone Encounter (Signed)
Pt confirmed labs/ov per 05/16 POF, gave pt AVS and Calendar.... KJ, sent msg to add Phlebotomy

## 2014-06-19 DIAGNOSIS — T148 Other injury of unspecified body region: Secondary | ICD-10-CM | POA: Diagnosis not present

## 2014-06-19 DIAGNOSIS — Z23 Encounter for immunization: Secondary | ICD-10-CM | POA: Diagnosis not present

## 2014-06-19 DIAGNOSIS — I10 Essential (primary) hypertension: Secondary | ICD-10-CM | POA: Diagnosis not present

## 2014-06-19 DIAGNOSIS — M810 Age-related osteoporosis without current pathological fracture: Secondary | ICD-10-CM | POA: Diagnosis not present

## 2014-07-15 ENCOUNTER — Ambulatory Visit: Payer: Medicare Other | Admitting: Hematology

## 2014-07-15 ENCOUNTER — Other Ambulatory Visit (HOSPITAL_BASED_OUTPATIENT_CLINIC_OR_DEPARTMENT_OTHER): Payer: Medicare Other

## 2014-07-15 ENCOUNTER — Ambulatory Visit: Payer: Medicare Other

## 2014-07-15 DIAGNOSIS — D751 Secondary polycythemia: Secondary | ICD-10-CM

## 2014-07-15 LAB — CBC WITH DIFFERENTIAL/PLATELET
BASO%: 0.2 % (ref 0.0–2.0)
Basophils Absolute: 0 10*3/uL (ref 0.0–0.1)
EOS%: 3 % (ref 0.0–7.0)
Eosinophils Absolute: 0.3 10*3/uL (ref 0.0–0.5)
HCT: 39.9 % (ref 34.8–46.6)
HGB: 12.3 g/dL (ref 11.6–15.9)
LYMPH%: 6.3 % — ABNORMAL LOW (ref 14.0–49.7)
MCH: 20.5 pg — ABNORMAL LOW (ref 25.1–34.0)
MCHC: 30.8 g/dL — ABNORMAL LOW (ref 31.5–36.0)
MCV: 66.6 fL — AB (ref 79.5–101.0)
MONO#: 0.9 10*3/uL (ref 0.1–0.9)
MONO%: 8.1 % (ref 0.0–14.0)
NEUT#: 9.1 10*3/uL — ABNORMAL HIGH (ref 1.5–6.5)
NEUT%: 82.4 % — ABNORMAL HIGH (ref 38.4–76.8)
Platelets: 418 10*3/uL — ABNORMAL HIGH (ref 145–400)
RBC: 5.99 10*6/uL — AB (ref 3.70–5.45)
RDW: 18.3 % — ABNORMAL HIGH (ref 11.2–14.5)
WBC: 11 10*3/uL — ABNORMAL HIGH (ref 3.9–10.3)
lymph#: 0.7 10*3/uL — ABNORMAL LOW (ref 0.9–3.3)

## 2014-07-16 DIAGNOSIS — Z8781 Personal history of (healed) traumatic fracture: Secondary | ICD-10-CM | POA: Diagnosis not present

## 2014-07-16 DIAGNOSIS — M419 Scoliosis, unspecified: Secondary | ICD-10-CM | POA: Diagnosis not present

## 2014-07-16 DIAGNOSIS — M810 Age-related osteoporosis without current pathological fracture: Secondary | ICD-10-CM | POA: Diagnosis not present

## 2014-07-16 DIAGNOSIS — M545 Low back pain: Secondary | ICD-10-CM | POA: Diagnosis not present

## 2014-07-24 DIAGNOSIS — M545 Low back pain: Secondary | ICD-10-CM | POA: Diagnosis not present

## 2014-07-29 ENCOUNTER — Other Ambulatory Visit: Payer: Self-pay

## 2014-07-30 DIAGNOSIS — M5416 Radiculopathy, lumbar region: Secondary | ICD-10-CM | POA: Diagnosis not present

## 2014-07-30 DIAGNOSIS — M545 Low back pain: Secondary | ICD-10-CM | POA: Diagnosis not present

## 2014-08-02 DIAGNOSIS — M545 Low back pain: Secondary | ICD-10-CM | POA: Diagnosis not present

## 2014-08-02 DIAGNOSIS — M5416 Radiculopathy, lumbar region: Secondary | ICD-10-CM | POA: Diagnosis not present

## 2014-08-07 DIAGNOSIS — M545 Low back pain: Secondary | ICD-10-CM | POA: Diagnosis not present

## 2014-08-07 DIAGNOSIS — M5416 Radiculopathy, lumbar region: Secondary | ICD-10-CM | POA: Diagnosis not present

## 2014-08-08 DIAGNOSIS — M5416 Radiculopathy, lumbar region: Secondary | ICD-10-CM | POA: Diagnosis not present

## 2014-08-08 DIAGNOSIS — M545 Low back pain: Secondary | ICD-10-CM | POA: Diagnosis not present

## 2014-08-12 ENCOUNTER — Other Ambulatory Visit (HOSPITAL_BASED_OUTPATIENT_CLINIC_OR_DEPARTMENT_OTHER): Payer: Medicare Other

## 2014-08-12 DIAGNOSIS — D751 Secondary polycythemia: Secondary | ICD-10-CM

## 2014-08-12 DIAGNOSIS — D45 Polycythemia vera: Secondary | ICD-10-CM

## 2014-08-12 LAB — CBC WITH DIFFERENTIAL/PLATELET
BASO%: 0.2 % (ref 0.0–2.0)
BASOS ABS: 0 10*3/uL (ref 0.0–0.1)
EOS%: 2.7 % (ref 0.0–7.0)
Eosinophils Absolute: 0.3 10*3/uL (ref 0.0–0.5)
HCT: 40.3 % (ref 34.8–46.6)
HEMOGLOBIN: 12.6 g/dL (ref 11.6–15.9)
LYMPH#: 0.7 10*3/uL — AB (ref 0.9–3.3)
LYMPH%: 6 % — AB (ref 14.0–49.7)
MCH: 20.4 pg — AB (ref 25.1–34.0)
MCHC: 31.2 g/dL — ABNORMAL LOW (ref 31.5–36.0)
MCV: 65.2 fL — AB (ref 79.5–101.0)
MONO#: 0.8 10*3/uL (ref 0.1–0.9)
MONO%: 7.5 % (ref 0.0–14.0)
NEUT#: 9.1 10*3/uL — ABNORMAL HIGH (ref 1.5–6.5)
NEUT%: 83.6 % — ABNORMAL HIGH (ref 38.4–76.8)
PLATELETS: 483 10*3/uL — AB (ref 145–400)
RBC: 6.18 10*6/uL — AB (ref 3.70–5.45)
RDW: 18.5 % — AB (ref 11.2–14.5)
WBC: 10.9 10*3/uL — ABNORMAL HIGH (ref 3.9–10.3)

## 2014-08-15 DIAGNOSIS — M545 Low back pain: Secondary | ICD-10-CM | POA: Diagnosis not present

## 2014-08-15 DIAGNOSIS — M5416 Radiculopathy, lumbar region: Secondary | ICD-10-CM | POA: Diagnosis not present

## 2014-08-19 DIAGNOSIS — M5416 Radiculopathy, lumbar region: Secondary | ICD-10-CM | POA: Diagnosis not present

## 2014-08-19 DIAGNOSIS — M545 Low back pain: Secondary | ICD-10-CM | POA: Diagnosis not present

## 2014-08-20 DIAGNOSIS — M810 Age-related osteoporosis without current pathological fracture: Secondary | ICD-10-CM | POA: Diagnosis not present

## 2014-08-21 DIAGNOSIS — M545 Low back pain: Secondary | ICD-10-CM | POA: Diagnosis not present

## 2014-08-21 DIAGNOSIS — M5416 Radiculopathy, lumbar region: Secondary | ICD-10-CM | POA: Diagnosis not present

## 2014-08-22 DIAGNOSIS — M545 Low back pain: Secondary | ICD-10-CM | POA: Diagnosis not present

## 2014-08-22 DIAGNOSIS — M5416 Radiculopathy, lumbar region: Secondary | ICD-10-CM | POA: Diagnosis not present

## 2014-09-09 ENCOUNTER — Other Ambulatory Visit (HOSPITAL_BASED_OUTPATIENT_CLINIC_OR_DEPARTMENT_OTHER): Payer: Medicare Other

## 2014-09-09 ENCOUNTER — Encounter: Payer: Self-pay | Admitting: Hematology

## 2014-09-09 ENCOUNTER — Ambulatory Visit (HOSPITAL_BASED_OUTPATIENT_CLINIC_OR_DEPARTMENT_OTHER): Payer: Medicare Other | Admitting: Hematology

## 2014-09-09 ENCOUNTER — Telehealth: Payer: Self-pay | Admitting: Hematology

## 2014-09-09 VITALS — BP 163/62 | HR 74 | Temp 98.1°F | Resp 18 | Ht 65.0 in | Wt 94.1 lb

## 2014-09-09 DIAGNOSIS — M8088XS Other osteoporosis with current pathological fracture, vertebra(e), sequela: Secondary | ICD-10-CM

## 2014-09-09 DIAGNOSIS — D751 Secondary polycythemia: Secondary | ICD-10-CM

## 2014-09-09 DIAGNOSIS — Z853 Personal history of malignant neoplasm of breast: Secondary | ICD-10-CM | POA: Diagnosis not present

## 2014-09-09 DIAGNOSIS — D45 Polycythemia vera: Secondary | ICD-10-CM

## 2014-09-09 DIAGNOSIS — C50911 Malignant neoplasm of unspecified site of right female breast: Secondary | ICD-10-CM

## 2014-09-09 LAB — CBC WITH DIFFERENTIAL/PLATELET
BASO%: 0.7 % (ref 0.0–2.0)
Basophils Absolute: 0.1 10*3/uL (ref 0.0–0.1)
EOS ABS: 0.3 10*3/uL (ref 0.0–0.5)
EOS%: 2 % (ref 0.0–7.0)
HCT: 42.9 % (ref 34.8–46.6)
HGB: 13.3 g/dL (ref 11.6–15.9)
LYMPH#: 0.9 10*3/uL (ref 0.9–3.3)
LYMPH%: 7.1 % — AB (ref 14.0–49.7)
MCH: 20.6 pg — ABNORMAL LOW (ref 25.1–34.0)
MCHC: 31 g/dL — ABNORMAL LOW (ref 31.5–36.0)
MCV: 66.3 fL — ABNORMAL LOW (ref 79.5–101.0)
MONO#: 0.8 10*3/uL (ref 0.1–0.9)
MONO%: 6.6 % (ref 0.0–14.0)
NEUT%: 83.6 % — ABNORMAL HIGH (ref 38.4–76.8)
NEUTROS ABS: 10.6 10*3/uL — AB (ref 1.5–6.5)
PLATELETS: 389 10*3/uL (ref 145–400)
RBC: 6.46 10*6/uL — ABNORMAL HIGH (ref 3.70–5.45)
RDW: 19.4 % — AB (ref 11.2–14.5)
WBC: 12.7 10*3/uL — AB (ref 3.9–10.3)

## 2014-09-09 NOTE — Telephone Encounter (Signed)
Gave patient avs report and appointments for October and November.  °

## 2014-09-09 NOTE — Progress Notes (Signed)
[facility] PROGRESS NOTE DATE OF VISIT: 09/09/2014   Kelly Medal, MD [address], [place] [address]  DIAGNOSIS: POLYCYTHEMIA  Malignant neoplasm of female breast, right, Breast Cancer   CURRENT THERAPY: Phlebotomy if hematocrit is greater than or equal to 42% (changed to 45% in May 2016). The patient receives 300 mL of normal saline following each 1-unit phlebotomy (last on 05/20/2014) and he is. Anagrelide 0.5 mg twice daily and aspirin 81 mg daily, anagrelide increased to 1.0mg  AM and 0.5mg  PM since Feb 2016   INTERVAL HISTORY:  [name] 81 y.o. female with a history of polycythemia vera (04/21/2007) with positive JAK2 mutation is here for follow-up.  She is doing well overall. She recently finished her physical therapy for the thoracic spine compression fracture, her pain has improved, although she still has some discomfort when she bends over or move around. Her primary care physician has recommended Prolia, and she is likely commence testosterone. She otherwise denies any other pain, nausea, leg swollen, dyspnea, or other symptoms. She functions well at home. She is very compliant with her anagrelide.  MEDICAL HISTORY: Past Medical History  Diagnosis Date  . MVP (mitral valve prolapse)   . Hypertension   . Chest tightness   . Palpitation   . PVC's (premature ventricular contractions)   . Breast cancer     right  . Polycythemia   . Chronic gastritis   . Diverticulosis   . Candida esophagitis   . Arthritis     INTERIM HISTORY: has Malignant neoplasm of female breast; POLYCYTHEMIA; GASTRITIS, CHRONIC; DIVERTICULOSIS OF COLON; ARTHRITIS; ABDOMINAL PAIN, EPIGASTRIC; HTN (hypertension); and MVP (mitral valve prolapse) on her problem list.    ALLERGIES:  is allergic to sulfur.  MEDICATIONS: has a current medication list which includes the following prescription(s): amlodipine, anagrelide, aspirin, cetirizine hcl, vitamin  d3, colestipol, cyclobenzaprine, dicyclomine, fluticasone, hydrochlorothiazide, ibandronate, lisinopril, loperamide, meloxicam, metoprolol succinate, multivitamin, pantoprazole, potassium chloride, and probiotic product.  SURGICAL HISTORY:  Past Surgical History  Procedure Laterality Date  . Tonsillectomy    . Appendectomy    . Cardiovascular stress test  12/23/2000    EF 70%  . Transthoracic echocardiogram  03/05/2010    EF 55-60%  . Abdominal hysterectomy      ovaries taken  . Breast lumpectomy      right  . Elbow fracture surgery      left  . Cataract extraction      bilateral  . Hematoma evacuation      right   PROBLEM LIST:  1. Polycythemia vera diagnosed in March 2009. JAK2 mutation was detected on 04/21/2007. Hemoglobin in April 2009 was 18.4 with hematocrit of 55.0. Platelet count on July 12, 2007, was 868,000. The patient has never had a bone marrow exam. She had been undergoing phlebotomies in the past, approximately once a month, for hematocrit greater than or equal to 45%. The patient has been on anagrelide 0.5 mg twice daily, as well as aspirin. She has not had any thrombotic complications. We will be carrying out a 1-unit phlebotomy whenever the hematocrit is greater than or equal to 42%. The patient does require normal saline 300 mL after phlebotomy because of symptomatic hypotension.  2. 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  Lumpectomy on 01/04/2006 followed by radiation treatments under the direction of Dr. Gery Pray. The patient has not required any systemic therapy. She does have yearly mammograms.  3. Mitral valve prolapse.  4.  Hypertension.  5. Diverticulosis.  6. History of esophageal stricture.  7. History of gastritis.  8. History of pelvic fractures after falling on August 23, 2006.  REVIEW OF SYSTEMS:   Constitutional: Denies fevers, chills or abnormal weight loss Eyes: Denies blurriness of vision Ears, nose, mouth, throat, and face:  Denies mucositis or sore throat Respiratory: Denies cough, dyspnea or wheezes Cardiovascular: Denies palpitation, chest discomfort or lower extremity swelling Gastrointestinal:  Denies nausea, heartburn or change in bowel habits Skin: Denies abnormal skin rashes Lymphatics: Denies new lymphadenopathy or easy bruising Neurological:Denies numbness, tingling or new weaknesses Behavioral/Psych: Mood is stable, no new changes  All other systems were reviewed with the patient and are negative.  PHYSICAL EXAMINATION: ECOG PERFORMANCE STATUS: 0  There were no vitals taken for this visit.  GENERAL:alert, no distress and comfortable; she has scoliosis and looks her stated age.  SKIN: skin color, texture, turgor are normal, no rashes but seborrheic keratosis.  EYES: normal, Conjunctiva are pink and non-injected, sclera clear OROPHARYNX:no exudate, no erythema and lips, buccal mucosa, and tongue normal  NECK: supple, thyroid normal size, non-tender, without nodularity LYMPH:  no palpable lymphadenopathy in the cervical, axillary or supraclavicular LUNGS: clear to auscultation and percussion with normal breathing effort HEART: regular rate & rhythm and no murmurs and no lower extremity edema ABDOMEN:abdomen soft, non-tender and normal bowel sounds Musculoskeletal: (+) Mild tenderness at the right hip and pelvic area. The range of motion of right hip is normal. NEURO: alert & oriented x 3 with fluent speech, no focal motor/sensory deficits Breasts: Breast inspection showed them to be symmetrical with no nipple discharge. Status post right lumpectomy change.  Palpation of the breasts and axilla revealed no obvious mass that I could appreciate.   LABORATORY DATA: CBC Latest Ref Rng 09/09/2014 08/12/2014 07/15/2014  WBC 3.9 - 10.3 10e3/uL 12.7(H) 10.9(H) 11.0(H)  Hemoglobin 11.6 - 15.9 g/dL 13.3 12.6 12.3  Hematocrit 34.8 - 46.6 % 42.9 40.3 39.9  Platelets 145 - 400 10e3/uL 389 483(H) 418(H)    CMP  Latest Ref Rng 03/25/2014 12/03/2013 08/01/2013  Glucose 70 - 140 mg/dl 99 107 98  BUN 7.0 - 26.0 mg/dL 15.7 19.4 18.2  Creatinine 0.6 - 1.1 mg/dL 1.2(H) 1.2(H) 1.3(H)  Sodium 136 - 145 mEq/L 134(L) 133(L) 134(L)  Potassium 3.5 - 5.1 mEq/L 4.6 4.5 5.0  Chloride 96 - 112 mEq/L - - -  CO2 22 - 29 mEq/L 27 26 27   Calcium 8.4 - 10.4 mg/dL 9.9 9.9 9.9  Total Protein 6.4 - 8.3 g/dL 6.4 6.8 6.5  Total Bilirubin 0.20 - 1.20 mg/dL 0.40 0.41 0.42  Alkaline Phos 40 - 150 U/L 72 76 67  AST 5 - 34 U/L 18 17 16   ALT 0 - 55 U/L 12 17 11        IMAGING STUDIES:  1. Ultrasound of the abdomen, limited, on 01/21/2006, showed a splenic volume of 73 cc with maximum ultrasound dimension of 8.8 cm. Spleen measured 8.8 x 4.1 x 3.9 cm, for a volume of 73 cc. No focal lesions were identified.  2. Diagnostic bilateral digital mammogram carried out at [facility] on 01/01/2011 showed no suspicious findings.  3. Mammogram, diagnostic, bilateral on 01/04/2012 showed no significant abnormalities. There were post lumpectomy changes present involving the right breast.   ASSESSMENT: [name] 81 y.o. female with a history of POLYCYTHEMIA  Malignant neoplasm of female breast, right   PLAN:  1. Myeloproliferative neoplasm, Polycythemia vera. --The patient is doing  well clinically. -Her hematocrit is 43% today, she has not needed phlebotomies since April 2016  - she increased her anagrelide in 03/2014, platelet count has been gradually coming down to normal, 389K today.   -We'll continue anagrelide at 1 mg in the morning and 0.5 mg in the evening, and watch her CBC closely. -We discussed this is a incurable disease, most people have indolent and benign course, the major complication is thrombosis. Some patients may develop leukemia and myelofibrosis at the end. -Continue baby aspirin daily  2. History of right breast DCIS  --Involving the right breast with biopsy on 11/29/2005.  Lumpectomy on 01/04/2006  followed by radiation treatments under the direction of Dr. Gery Pray.  Her mammogram on 01/03/2013 was negative.  -She is scheduled to have a repeated MRI in November. Her breast exam was unremarkable today.  3. L2 compression fracture in 04/2014, osteoporosis -Her pain is better -Continue supportive care and symptom management by her primary care physician -She will likely start Prolia with her PCP, I recommend her to increase calcium and vitamin D to 2 tablets a day   Plan -CBC in 2 month and  I'll see her back in 4 months. Consider phlebotomy with half unit and normal saline infusion if her hematocrit above 45%.  All questions were answered. The patient knows to call the clinic with any problems, questions or concerns. We can certainly see the patient much sooner if necessary.  Patient was provided a handout on polycythemia vera.   I spent 20 minutes counseling the patient face to face. The total time spent in the appointment was 25 minutes.   Truitt Merle  09/09/2014

## 2014-10-08 DIAGNOSIS — B029 Zoster without complications: Secondary | ICD-10-CM | POA: Diagnosis not present

## 2014-10-16 DIAGNOSIS — B029 Zoster without complications: Secondary | ICD-10-CM | POA: Diagnosis not present

## 2014-10-16 DIAGNOSIS — Z23 Encounter for immunization: Secondary | ICD-10-CM | POA: Diagnosis not present

## 2014-11-04 ENCOUNTER — Other Ambulatory Visit (HOSPITAL_BASED_OUTPATIENT_CLINIC_OR_DEPARTMENT_OTHER): Payer: Medicare Other

## 2014-11-04 ENCOUNTER — Ambulatory Visit: Payer: Medicare Other

## 2014-11-04 DIAGNOSIS — D45 Polycythemia vera: Secondary | ICD-10-CM

## 2014-11-04 DIAGNOSIS — D751 Secondary polycythemia: Secondary | ICD-10-CM

## 2014-11-04 LAB — CBC WITH DIFFERENTIAL/PLATELET
BASO%: 0.2 % (ref 0.0–2.0)
BASOS ABS: 0 10*3/uL (ref 0.0–0.1)
EOS%: 1.3 % (ref 0.0–7.0)
Eosinophils Absolute: 0.1 10*3/uL (ref 0.0–0.5)
HEMATOCRIT: 44.6 % (ref 34.8–46.6)
HEMOGLOBIN: 14.1 g/dL (ref 11.6–15.9)
LYMPH#: 0.6 10*3/uL — AB (ref 0.9–3.3)
LYMPH%: 6 % — ABNORMAL LOW (ref 14.0–49.7)
MCH: 21.5 pg — AB (ref 25.1–34.0)
MCHC: 31.6 g/dL (ref 31.5–36.0)
MCV: 68 fL — ABNORMAL LOW (ref 79.5–101.0)
MONO#: 0.8 10*3/uL (ref 0.1–0.9)
MONO%: 7.6 % (ref 0.0–14.0)
NEUT#: 9.1 10*3/uL — ABNORMAL HIGH (ref 1.5–6.5)
NEUT%: 84.9 % — ABNORMAL HIGH (ref 38.4–76.8)
Platelets: 490 10*3/uL — ABNORMAL HIGH (ref 145–400)
RBC: 6.56 10*6/uL — ABNORMAL HIGH (ref 3.70–5.45)
RDW: 20.4 % — ABNORMAL HIGH (ref 11.2–14.5)
WBC: 10.7 10*3/uL — ABNORMAL HIGH (ref 3.9–10.3)

## 2014-11-04 NOTE — Progress Notes (Signed)
Per Dr. Burr Medico, pt does not need phlebotomy today.  Spoke with pt in lobby who states she does not feel she needs it if labs looked okay.  Pt informed to return as scheduled next month for labs and possible phlebotomy.  Pt also notified to let us know if anything comes up or changes.  Pt discharged from lobby with no further questions or concerns.

## 2014-11-08 DIAGNOSIS — H04123 Dry eye syndrome of bilateral lacrimal glands: Secondary | ICD-10-CM | POA: Diagnosis not present

## 2014-11-08 DIAGNOSIS — H01001 Unspecified blepharitis right upper eyelid: Secondary | ICD-10-CM | POA: Diagnosis not present

## 2014-11-08 DIAGNOSIS — H01004 Unspecified blepharitis left upper eyelid: Secondary | ICD-10-CM | POA: Diagnosis not present

## 2014-11-08 DIAGNOSIS — H524 Presbyopia: Secondary | ICD-10-CM | POA: Diagnosis not present

## 2014-12-02 ENCOUNTER — Other Ambulatory Visit: Payer: Self-pay | Admitting: Cardiovascular Disease

## 2014-12-09 ENCOUNTER — Other Ambulatory Visit: Payer: Self-pay | Admitting: Hematology

## 2014-12-30 ENCOUNTER — Encounter: Payer: Self-pay | Admitting: Hematology

## 2014-12-30 ENCOUNTER — Ambulatory Visit (HOSPITAL_BASED_OUTPATIENT_CLINIC_OR_DEPARTMENT_OTHER): Payer: Medicare Other

## 2014-12-30 ENCOUNTER — Other Ambulatory Visit (HOSPITAL_BASED_OUTPATIENT_CLINIC_OR_DEPARTMENT_OTHER): Payer: Medicare Other

## 2014-12-30 ENCOUNTER — Ambulatory Visit (HOSPITAL_BASED_OUTPATIENT_CLINIC_OR_DEPARTMENT_OTHER): Payer: Medicare Other | Admitting: Hematology

## 2014-12-30 ENCOUNTER — Telehealth: Payer: Self-pay | Admitting: Hematology

## 2014-12-30 ENCOUNTER — Telehealth: Payer: Self-pay | Admitting: *Deleted

## 2014-12-30 VITALS — BP 164/73 | HR 82 | Temp 98.1°F | Resp 18 | Ht 65.0 in | Wt 95.8 lb

## 2014-12-30 VITALS — BP 150/68 | HR 88 | Temp 97.8°F | Resp 20

## 2014-12-30 DIAGNOSIS — D751 Secondary polycythemia: Secondary | ICD-10-CM

## 2014-12-30 DIAGNOSIS — D45 Polycythemia vera: Secondary | ICD-10-CM

## 2014-12-30 DIAGNOSIS — M810 Age-related osteoporosis without current pathological fracture: Secondary | ICD-10-CM | POA: Diagnosis not present

## 2014-12-30 DIAGNOSIS — C50511 Malignant neoplasm of lower-outer quadrant of right female breast: Secondary | ICD-10-CM

## 2014-12-30 LAB — CBC WITH DIFFERENTIAL/PLATELET
BASO%: 0.1 % (ref 0.0–2.0)
BASOS ABS: 0 10*3/uL (ref 0.0–0.1)
EOS%: 2 % (ref 0.0–7.0)
Eosinophils Absolute: 0.2 10*3/uL (ref 0.0–0.5)
HEMATOCRIT: 46.8 % — AB (ref 34.8–46.6)
HGB: 14.8 g/dL (ref 11.6–15.9)
LYMPH#: 0.7 10*3/uL — AB (ref 0.9–3.3)
LYMPH%: 6.8 % — AB (ref 14.0–49.7)
MCH: 22.3 pg — AB (ref 25.1–34.0)
MCHC: 31.6 g/dL (ref 31.5–36.0)
MCV: 70.6 fL — ABNORMAL LOW (ref 79.5–101.0)
MONO#: 0.8 10*3/uL (ref 0.1–0.9)
MONO%: 8 % (ref 0.0–14.0)
NEUT#: 8.6 10*3/uL — ABNORMAL HIGH (ref 1.5–6.5)
NEUT%: 83.1 % — AB (ref 38.4–76.8)
Platelets: 405 10*3/uL — ABNORMAL HIGH (ref 145–400)
RBC: 6.63 10*6/uL — AB (ref 3.70–5.45)
RDW: 18.3 % — ABNORMAL HIGH (ref 11.2–14.5)
WBC: 10.4 10*3/uL — ABNORMAL HIGH (ref 3.9–10.3)

## 2014-12-30 MED ORDER — SODIUM CHLORIDE 0.9 % IV SOLN: Freq: Once | INTRAVENOUS | Status: DC

## 2014-12-30 NOTE — Progress Notes (Signed)
[place] ONCOLOGY OFFICE PROGRESS NOTE DATE OF VISIT: 12/30/2014   Kelly Medal, MD [address] [place] [address]  DIAGNOSIS: Malignant neoplasm of lower-outer quadrant of right female breast ([place])  Polycythemia vera ([place]), Breast Cancer   PREVIOUS AND CURRENT THERAPY:  1. Phlebotomy if hematocrit is greater than or equal to 42% (changed to 45% in May 2016). The patient receives 300 mL of normal saline following each 1-unit phlebotomy (last on 05/20/2014)  2. Anagrelide 0.5 mg twice daily and aspirin 81 mg daily, anagrelide increased to 1.0mg  AM and 0.5mg  PM since Feb 2016  3. She previously tried Hydrea 500,000,001 daily, developed thrombocytopenia, was subsequently held.  INTERVAL HISTORY:  [name] 81 y.o. female with a history of polycythemia vera (04/21/2007) with positive JAK2 mutation is here for follow-up.  She is doing well overall. She still has mild to moderate back pain from her previous compression fracture, tolerable, she does not take much pain medication. She also has mild-to-moderate fatigue, able to do all self-care and light activity, still drives, but could not tolerate some activities she used to do. No other new complaints.  MEDICAL HISTORY: Past Medical History  Diagnosis Date  . MVP (mitral valve prolapse)   . Hypertension   . Chest tightness   . Palpitation   . PVC's (premature ventricular contractions)   . Breast cancer ([place])     right  . Polycythemia   . Chronic gastritis   . Diverticulosis   . Candida esophagitis ([place])   . Arthritis     INTERIM HISTORY: has Malignant neoplasm of female breast ([place]); Polycythemia vera ([place]); GASTRITIS, CHRONIC; DIVERTICULOSIS OF COLON; ARTHRITIS; ABDOMINAL PAIN, EPIGASTRIC; HTN (hypertension); and MVP (mitral valve prolapse) on her problem list.    ALLERGIES:  is allergic to sulfur.  MEDICATIONS: has a current medication list which includes the following prescription(s):  amlodipine, anagrelide, aspirin, calcium carbonate-vitamin d, cetirizine hcl, vitamin d3, colestipol, cyclobenzaprine, dicyclomine, fluticasone, hydrochlorothiazide, ibandronate, lisinopril, loperamide, metoprolol succinate, multivitamin, pantoprazole, potassium chloride, and probiotic product.  SURGICAL HISTORY:  Past Surgical History  Procedure Laterality Date  . Tonsillectomy    . Appendectomy    . Cardiovascular stress test  12/23/2000    EF 70%  . Transthoracic echocardiogram  03/05/2010    EF 55-60%  . Abdominal hysterectomy      ovaries taken  . Breast lumpectomy      right  . Elbow fracture surgery      left  . Cataract extraction      bilateral  . Hematoma evacuation      right   PROBLEM LIST:  1. Polycythemia vera diagnosed in March 2009. JAK2 mutation was detected on 04/21/2007. Hemoglobin in April 2009 was 18.4 with hematocrit of 55.0. Platelet count on July 12, 2007, was 868,000. The patient has never had a bone marrow exam. She had been undergoing phlebotomies in the past, approximately once a month, for hematocrit greater than or equal to 45%. The patient has been on anagrelide 0.5 mg twice daily, as well as aspirin. She has not had any thrombotic complications. We will be carrying out a 1-unit phlebotomy whenever the hematocrit is greater than or equal to 42%. The patient does require normal saline 300 mL after phlebotomy because of symptomatic hypotension.  2. 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  Lumpectomy on 01/04/2006 followed by radiation treatments under the direction of [name]. The patient has not required any systemic therapy. She  does have yearly mammograms.  3. Mitral valve prolapse.  4. Hypertension.  5. Diverticulosis.  6. History of esophageal stricture.  7. History of gastritis.  8. History of pelvic fractures after falling on August 23, 2006.  REVIEW OF SYSTEMS:   Constitutional: Denies fevers, chills or abnormal weight  loss Eyes: Denies blurriness of vision Ears, nose, mouth, throat, and face: Denies mucositis or sore throat Respiratory: Denies cough, dyspnea or wheezes Cardiovascular: Denies palpitation, chest discomfort or lower extremity swelling Gastrointestinal:  Denies nausea, heartburn or change in bowel habits Skin: Denies abnormal skin rashes Lymphatics: Denies new lymphadenopathy or easy bruising Neurological:Denies numbness, tingling or new weaknesses Behavioral/Psych: Mood is stable, no new changes  All other systems were reviewed with the patient and are negative.  PHYSICAL EXAMINATION: ECOG PERFORMANCE STATUS: 0  Blood pressure 164/73, pulse 82, temperature 98.1 F (36.7 C), temperature source Oral, resp. rate 18, height 5\' 5"  (1.651 m), weight 95 lb 12.8 oz (43.455 kg), SpO2 98 %.  GENERAL:alert, no distress and comfortable; she has scoliosis and looks her stated age.  SKIN: skin color, texture, turgor are normal, no rashes but seborrheic keratosis.  EYES: normal, Conjunctiva are pink and non-injected, sclera clear OROPHARYNX:no exudate, no erythema and lips, buccal mucosa, and tongue normal  NECK: supple, thyroid normal size, non-tender, without nodularity LYMPH:  no palpable lymphadenopathy in the cervical, axillary or supraclavicular LUNGS: clear to auscultation and percussion with normal breathing effort HEART: regular rate & rhythm and no murmurs and no lower extremity edema ABDOMEN:abdomen soft, non-tender and normal bowel sounds Musculoskeletal: (+) Mild tenderness at the right hip and pelvic area. The range of motion of right hip is normal. NEURO: alert & oriented x 3 with fluent speech, no focal motor/sensory deficits Breasts: Breast inspection showed them to be symmetrical with no nipple discharge. Status post right lumpectomy change.  Palpation of the breasts and axilla revealed no obvious mass that I could appreciate.   LABORATORY DATA: CBC Latest Ref Rng 12/30/2014  11/04/2014 09/09/2014  WBC 3.9 - 10.3 10e3/uL 10.4(H) 10.7(H) 12.7(H)  Hemoglobin 11.6 - 15.9 g/dL 14.8 14.1 13.3  Hematocrit 34.8 - 46.6 % 46.8(H) 44.6 42.9  Platelets 145 - 400 10e3/uL 405(H) 490(H) 389    CMP Latest Ref Rng 03/25/2014 12/03/2013 08/01/2013  Glucose 70 - 140 mg/dl 99 107 98  BUN 7.0 - 26.0 mg/dL 15.7 19.4 18.2  Creatinine 0.6 - 1.1 mg/dL 1.2(H) 1.2(H) 1.3(H)  Sodium 136 - 145 mEq/L 134(L) 133(L) 134(L)  Potassium 3.5 - 5.1 mEq/L 4.6 4.5 5.0  Chloride 96 - 112 mEq/L - - -  CO2 22 - 29 mEq/L 27 26 27   Calcium 8.4 - 10.4 mg/dL 9.9 9.9 9.9  Total Protein 6.4 - 8.3 g/dL 6.4 6.8 6.5  Total Bilirubin 0.20 - 1.20 mg/dL 0.40 0.41 0.42  Alkaline Phos 40 - 150 U/L 72 76 67  AST 5 - 34 U/L 18 17 16   ALT 0 - 55 U/L 12 17 11        IMAGING STUDIES:  1. Ultrasound of the abdomen, limited, on 01/21/2006, showed a splenic volume of 73 cc with maximum ultrasound dimension of 8.8 cm. Spleen measured 8.8 x 4.1 x 3.9 cm, for a volume of 73 cc. No focal lesions were identified.  2. Diagnostic bilateral digital mamm[facility] on 01[facility]01/2011 showed no suspicious findings.  3. Mammogram, diagnostic, bilateral on 01/04/2012 showed no significant abnormalities. There were post lumpectomy changes present involving the right breast.  ASSESSMENT: [name] 81 y.o. female with a history of Malignant neoplasm of lower-outer quadrant of right female breast ([place])  Polycythemia vera ([place])   PLAN:  1. Myeloproliferative neoplasm, Polycythemia vera, JAK2(+)  --The patient is doing well clinically. -Her hematocrit is 46% today, she is agreeable to have phlebotomy with half unit blood, and I will give her her to 50 mL normal saline today - she increased her anagrelide in 03/2014, platelet count has been gradually coming down to normal, 405K today.   -We'll continue anagrelide at 1 mg in the morning and 0.5 mg in the evening, and watch her CBC closely. -She previously  did not tolerate Hydrea due to thrombocytopenia. -We discussed this is a incurable disease, most people have indolent and benign course, the major complication is thrombosis. Some patients may develop leukemia and myelofibrosis at the end. -Continue baby aspirin daily  2. History of right breast DCIS  --Involving the right breast with biopsy on 11/29/2005.  Lumpectomy on 01/04/2006 followed by radiation treatments under the direction of [name].  -She is still on annual screening mammogram surveillance, last one this year was normal per pt.  3. L2 compression fracture in 04/2014, osteoporosis -Her pain is better -Continue supportive care and symptom management by her primary care physician -She is on Prolia with her PCP, I encourage her to continue calcium and vitamin D to 2 tablets a day   Plan -CBC every 2 month and  I'll see her back in 6 months.  -Consider phlebotomy with half unit and normal saline infusion if her hematocrit above 45%, she will have one today  All questions were answered. The patient knows to call the clinic with any problems, questions or concerns. We can certainly see the patient much sooner if necessary.  Patient was provided a handout on polycythemia vera.   I spent 20 minutes counseling the patient face to face. The total time spent in the appointment was 25 minutes.   Truitt Merle  12/30/2014

## 2014-12-30 NOTE — Patient Instructions (Signed)

## 2014-12-30 NOTE — Progress Notes (Signed)
Post  Phleb VSS, denies distress,  Rt AC site clean dry intact.

## 2014-12-30 NOTE — Telephone Encounter (Signed)
Per staff message and POF I have scheduled appts. Advised scheduler of appts. JMW  

## 2014-12-30 NOTE — Telephone Encounter (Signed)
per pof to sch pt appt-gave pt copy of sch-will call pt after reply-pt stated has MY CHART adv i will call

## 2015-01-07 ENCOUNTER — Ambulatory Visit (INDEPENDENT_AMBULATORY_CARE_PROVIDER_SITE_OTHER): Payer: Medicare Other | Admitting: Cardiovascular Disease

## 2015-01-07 ENCOUNTER — Encounter: Payer: Self-pay | Admitting: Cardiovascular Disease

## 2015-01-07 VITALS — BP 114/64 | HR 97 | Ht 65.0 in | Wt 95.1 lb

## 2015-01-07 DIAGNOSIS — I10 Essential (primary) hypertension: Secondary | ICD-10-CM

## 2015-01-07 DIAGNOSIS — I341 Nonrheumatic mitral (valve) prolapse: Secondary | ICD-10-CM

## 2015-01-07 LAB — BASIC METABOLIC PANEL
BUN: 22 mg/dL (ref 7–25)
CHLORIDE: 97 mmol/L — AB (ref 98–110)
CO2: 30 mmol/L (ref 20–31)
CREATININE: 1.05 mg/dL — AB (ref 0.60–0.88)
Calcium: 9.6 mg/dL (ref 8.6–10.4)
Glucose, Bld: 121 mg/dL — ABNORMAL HIGH (ref 65–99)
POTASSIUM: 4.3 mmol/L (ref 3.5–5.3)
Sodium: 134 mmol/L — ABNORMAL LOW (ref 135–146)

## 2015-01-07 NOTE — Progress Notes (Signed)
[name] Date of Birth  22-Aug-1926 [place] [address]    [address] [place], [place]  [address]    [place], [place]  [address] [phone]  Fax  [phone]  [phone]  Fax [phone]   History of Present Illness:  Problem list: 1. Mitral valve prolapse 2. Breast cancer 3. Polycythemia 4. Hypertension  [name] is an 81 year old female with a history of mitral valve prolapse, hypertension, and polycythemia. She has phlebotomy  about once a month for her polycythemia. Her blood pressure typically is little bit elevated and then goes down quickly after her phlebotomy. She's feeling quite well. She's exercising regularly.  July 05, 2012:  No CP.  She has mild  Dyspnea.  She is still getting phlebotomy regularly.  She has some mild dyspnea when she first lies down but it typically resolves and she doe not have to sleep on any pillows.  She is able to do her normal activities without problems.   07/09/2013:  [name] is doing ok.  Not walking as much as she used to.  Stays active. h Has lots of cramps in her feet.   Dec. 7 ,2015:  [name] is an 81 yo who we follow for HTN and MVP.  She also has polycythemia and gets phlebotomy once a month.  Staying moderately active.   Gets fatigued easy She had labs a month ago  Dec. 6, 2016:   [name] is doing ok from a cardiac standpoint . Broke her back this past march .  No CP ,  Occasional dyspnea.  Has polycythemia vera.  Had a phlebotomy last week .    Current Outpatient Prescriptions on File Prior to Visit  Medication Sig Dispense Refill  . amLODipine (NORVASC) 2.5 MG tablet TAKE (1/2) TABLET DAILY. (Patient taking differently: TAKE (1/2) TABLET BY MOUTH DAILY.) 15 tablet 11  . anagrelide (AGRYLIN) 0.5 MG capsule TAKE 2 CAPSULES IN THE MORNING AND 1 CAPSULE IN THE AFTERNOON. (Patient taking differently: TAKE 2 CAPSULES IN THE MORNING AND 1 CAPSULE IN THE AFTERNOON BY  MOUTH) 90 capsule 0  . aspirin 81 MG tablet Take 81 mg by mouth daily.      . Calcium Carbonate-Vitamin D (CALCIUM 500/D PO) Take 1 capsule by mouth daily.    . Cetirizine HCl (ZYRTEC PO) Take 1 tablet by mouth as needed (AS NEEDED FOR ALLERGIES).     . Cholecalciferol (VITAMIN D3) 1000 UNITS CAPS Take 2,600 mg by mouth 2 (two) times daily.     . colestipol (COLESTID) 1 G tablet Take 1 tablet (1 g total) by mouth daily. 30 tablet 5  . cyclobenzaprine (FLEXERIL) 5 MG tablet Take 1 tablet (5 mg total) by mouth 3 (three) times daily as needed for muscle spasms. 15 tablet 0  . dicyclomine (BENTYL) 10 MG capsule Take 1 capsule (10 mg total) by mouth 4 (four) times daily as needed for spasms. 120 capsule 1  . fluticasone (FLONASE) 50 MCG/ACT nasal spray Place 2 sprays into the nose as needed for allergies or rhinitis.     . hydrochlorothiazide (MICROZIDE) 12.5 MG capsule TAKE (1) CAPSULE DAILY. 30 capsule 11  . ibandronate (BONIVA) 150 MG tablet Take 150 mg by mouth every 30 (thirty) days.     Marland Kitchen lisinopril (PRINIVIL,ZESTRIL) 20 MG tablet TAKE 1 TABLET EACH DAY. (Patient taking differently: TAKE 1 TABLET BY MOUTH EACH DAY.) 30 tablet 11  .  loperamide (IMODIUM) 1 MG/5ML solution Take 5 mLs (1 mg total) by mouth daily. 150 mL 0  . metoprolol succinate (TOPROL-XL) 50 MG 24 hr tablet Take 1 tablet (50 mg total) by mouth daily. 90 tablet 2  . Multiple Vitamin (MULTIVITAMIN) tablet Take 1 tablet by mouth daily.      . pantoprazole (PROTONIX) 40 MG tablet Take 40 mg by mouth as needed (AS NEEDED FOR STOMACH).     Marland Kitchen potassium chloride (K-DUR) 10 MEQ tablet TAKE 1 TABLET ONCE DAILY. (Patient taking differently: TAKE 1 TABLET BY MOUTH ONCE DAILY.) 30 tablet 11  . Probiotic Product (ULTRAFLORA IMMUNE HEALTH PO) Take 1 tablet by mouth daily.     No current facility-administered medications on file prior to visit.    Allergies  Allergen Reactions  . Sulfur Other (See Comments)    Pt doesn't remember reaction.      Past Medical History  Diagnosis Date  . MVP (mitral valve prolapse)   . Hypertension   . Chest tightness   . Palpitation   . PVC's (premature ventricular contractions)   . Breast cancer ([place])     right  . Polycythemia   . Chronic gastritis   . Diverticulosis   . Candida esophagitis ([place])   . Arthritis     Past Surgical History  Procedure Laterality Date  . Tonsillectomy    . Appendectomy    . Cardiovascular stress test  12/23/2000    EF 70%  . Transthoracic echocardiogram  03/05/2010    EF 55-60%  . Abdominal hysterectomy      ovaries taken  . Breast lumpectomy      right  . Elbow fracture surgery      left  . Cataract extraction      bilateral  . Hematoma evacuation      right    History  Smoking status  . Former Smoker  . Quit date: 08/24/1980  Smokeless tobacco  . Never Used    History  Alcohol Use  . Yes    Comment: occasional    Family History  Problem Relation Age of Onset  . Stomach cancer Mother   . Hypertension Father   . Stroke Father   . Hypertension Sister   . Hypertension Brother   . Prostate cancer Brother   . Colon cancer Neg Hx   . Prostate cancer Father   . Diabetes Brother     Reviw of Systems:  Reviewed in the HPI.  All other systems are negative.  Physical Exam: BP 114/64 mmHg  Pulse 97  Ht 5\' 5"  (1.651 m)  Wt 95 lb 1.9 oz (43.146 kg)  BMI 15.83 kg/m2 The patient is alert and oriented x 3.  The mood and affect are normal.   Skin: warm and dry.  Color is normal.   HEENT:   Neck is supple. His membranes are moist. The carotids are normal. No JVD. Lungs: Lungs are clear.  Heart: Regular rate S1-S2.  She is a soft systolic murmur. Abdomen: She is thin. She has good bowel sounds. There are no bruits  Extremities:  No clubbing cyanosis or edema Neuro:  Nonfocal.    ECG: Dec. 6, 2016:  NSR at 97.   No ST or T wave changes.   Assessment / Plan:   1. Mitral valve prolapse- stable 2. Breast cancer 3.  Polycythemia 4. Hypertension - blood pressure is well-controlled. Continue current medications. We'll check a basic medical profile today. I'll see her again in  one year.    Nahser, Wonda Cheng, MD  01/07/2015 2:52 PM    [facility] [place],  [place], [place]  [address] Pager [phone] Phone: [phone]; Fax: [phone]   [facility]  [address] [place], [place]  [address] [phone]   Fax [phone]

## 2015-01-07 NOTE — Patient Instructions (Signed)
Medication Instructions:  Your physician recommends that you continue on your current medications as directed. Please refer to the Current Medication list given to you today.   Labwork: TODAY - basic metabolic panel   Testing/Procedures: None Ordered   Follow-Up: Your physician wants you to follow-up in: 1 year with Dr. Nahser. You will receive a reminder letter in the mail two months in advance. If you don't receive a letter, please call our office to schedule the follow-up appointment.   If you need a refill on your cardiac medications before your next appointment, please call your pharmacy.   Thank you for choosing [facility]! Shannin Naab, RN [phone]    

## 2015-01-14 ENCOUNTER — Other Ambulatory Visit: Payer: Self-pay | Admitting: Hematology

## 2015-01-16 DIAGNOSIS — Z1231 Encounter for screening mammogram for malignant neoplasm of breast: Secondary | ICD-10-CM | POA: Diagnosis not present

## 2015-01-16 DIAGNOSIS — Z853 Personal history of malignant neoplasm of breast: Secondary | ICD-10-CM | POA: Diagnosis not present

## 2015-01-21 ENCOUNTER — Other Ambulatory Visit: Payer: Self-pay | Admitting: Cardiovascular Disease

## 2015-02-11 ENCOUNTER — Other Ambulatory Visit: Payer: Self-pay | Admitting: Hematology

## 2015-02-17 ENCOUNTER — Other Ambulatory Visit: Payer: Self-pay | Admitting: Cardiovascular Disease

## 2015-02-28 ENCOUNTER — Other Ambulatory Visit: Payer: Self-pay | Admitting: *Deleted

## 2015-02-28 DIAGNOSIS — D45 Polycythemia vera: Secondary | ICD-10-CM

## 2015-03-03 ENCOUNTER — Other Ambulatory Visit (HOSPITAL_BASED_OUTPATIENT_CLINIC_OR_DEPARTMENT_OTHER): Payer: Medicare Other

## 2015-03-03 ENCOUNTER — Other Ambulatory Visit: Payer: Self-pay | Admitting: Cardiovascular Disease

## 2015-03-03 ENCOUNTER — Ambulatory Visit (HOSPITAL_BASED_OUTPATIENT_CLINIC_OR_DEPARTMENT_OTHER): Payer: Medicare Other

## 2015-03-03 VITALS — BP 156/55 | HR 64 | Temp 97.7°F | Resp 18

## 2015-03-03 DIAGNOSIS — D45 Polycythemia vera: Secondary | ICD-10-CM

## 2015-03-03 DIAGNOSIS — D751 Secondary polycythemia: Secondary | ICD-10-CM

## 2015-03-03 LAB — CBC WITH DIFFERENTIAL/PLATELET
BASO%: 0.6 % (ref 0.0–2.0)
BASOS ABS: 0.1 10*3/uL (ref 0.0–0.1)
EOS ABS: 0.2 10*3/uL (ref 0.0–0.5)
EOS%: 1.7 % (ref 0.0–7.0)
HEMATOCRIT: 45.3 % (ref 34.8–46.6)
HEMOGLOBIN: 14 g/dL (ref 11.6–15.9)
LYMPH#: 0.7 10*3/uL — AB (ref 0.9–3.3)
LYMPH%: 5.2 % — ABNORMAL LOW (ref 14.0–49.7)
MCH: 20.7 pg — ABNORMAL LOW (ref 25.1–34.0)
MCHC: 30.9 g/dL — ABNORMAL LOW (ref 31.5–36.0)
MCV: 67.1 fL — AB (ref 79.5–101.0)
MONO#: 0.8 10*3/uL (ref 0.1–0.9)
MONO%: 6.5 % (ref 0.0–14.0)
NEUT%: 86 % — ABNORMAL HIGH (ref 38.4–76.8)
NEUTROS ABS: 10.9 10*3/uL — AB (ref 1.5–6.5)
Platelets: 457 10*3/uL — ABNORMAL HIGH (ref 145–400)
RBC: 6.75 10*6/uL — ABNORMAL HIGH (ref 3.70–5.45)
RDW: 17.9 % — AB (ref 11.2–14.5)
WBC: 12.6 10*3/uL — AB (ref 3.9–10.3)

## 2015-03-03 MED ORDER — SODIUM CHLORIDE 0.9 % IV SOLN
Freq: Once | INTRAVENOUS | Status: AC
  Administered 2015-03-03: 11:00:00 via INTRAVENOUS

## 2015-03-03 NOTE — Progress Notes (Signed)
1100-Phlebotomy via 20g angio cath to right ac without difficulty, 250 grams removed.  250 ml of NS given after phlebotomy over 30 minutes per MD order.  Pt ate snack and drink post phlebotomy.  Pressure dressing applied to IV site.  Post phlebotomy discharge instructions reviewed with pt and she has no questions at this time.

## 2015-03-03 NOTE — Patient Instructions (Signed)

## 2015-03-17 ENCOUNTER — Other Ambulatory Visit: Payer: Self-pay | Admitting: Hematology

## 2015-03-24 ENCOUNTER — Other Ambulatory Visit: Payer: Self-pay

## 2015-04-14 ENCOUNTER — Other Ambulatory Visit: Payer: Self-pay | Admitting: Hematology

## 2015-04-29 ENCOUNTER — Ambulatory Visit: Payer: Medicare Other

## 2015-04-29 ENCOUNTER — Other Ambulatory Visit (HOSPITAL_BASED_OUTPATIENT_CLINIC_OR_DEPARTMENT_OTHER): Payer: Medicare Other

## 2015-04-29 DIAGNOSIS — D45 Polycythemia vera: Secondary | ICD-10-CM | POA: Diagnosis not present

## 2015-04-29 LAB — CBC WITH DIFFERENTIAL/PLATELET
BASO%: 0.5 % (ref 0.0–2.0)
Basophils Absolute: 0.1 10*3/uL (ref 0.0–0.1)
EOS%: 2.1 % (ref 0.0–7.0)
Eosinophils Absolute: 0.3 10*3/uL (ref 0.0–0.5)
HCT: 44.4 % (ref 34.8–46.6)
HGB: 13.3 g/dL (ref 11.6–15.9)
LYMPH%: 6.8 % — AB (ref 14.0–49.7)
MCH: 19.7 pg — ABNORMAL LOW (ref 25.1–34.0)
MCHC: 30 g/dL — AB (ref 31.5–36.0)
MCV: 65.8 fL — AB (ref 79.5–101.0)
MONO#: 0.8 10*3/uL (ref 0.1–0.9)
MONO%: 6 % (ref 0.0–14.0)
NEUT#: 10.6 10*3/uL — ABNORMAL HIGH (ref 1.5–6.5)
NEUT%: 84.6 % — AB (ref 38.4–76.8)
PLATELETS: 471 10*3/uL — AB (ref 145–400)
RBC: 6.76 10*6/uL — AB (ref 3.70–5.45)
RDW: 18.5 % — ABNORMAL HIGH (ref 11.2–14.5)
WBC: 12.5 10*3/uL — ABNORMAL HIGH (ref 3.9–10.3)
lymph#: 0.9 10*3/uL (ref 0.9–3.3)

## 2015-04-29 NOTE — Progress Notes (Signed)
Labs reviewed by Dr. Burr Medico and patient was given a copy of labs by nurse and agreed she did not need therapeutic phlebotomy today - she feels well.

## 2015-04-30 DIAGNOSIS — D1801 Hemangioma of skin and subcutaneous tissue: Secondary | ICD-10-CM | POA: Diagnosis not present

## 2015-04-30 DIAGNOSIS — L820 Inflamed seborrheic keratosis: Secondary | ICD-10-CM | POA: Diagnosis not present

## 2015-05-08 ENCOUNTER — Inpatient Hospital Stay (HOSPITAL_COMMUNITY): Payer: Medicare Other | Admitting: Anesthesiology

## 2015-05-08 ENCOUNTER — Encounter (HOSPITAL_COMMUNITY)
Admission: EM | Disposition: A | Discharge status: Skilled Nursing Facility | Payer: Self-pay | Source: Home / Self Care | Attending: Internal Medicine

## 2015-05-08 ENCOUNTER — Inpatient Hospital Stay (HOSPITAL_COMMUNITY)
Admission: EM | Admit: 2015-05-08 | Discharge: 2015-05-13 | DRG: 481 | Disposition: A | Discharge status: Skilled Nursing Facility | Payer: Medicare Other | Attending: Internal Medicine | Admitting: Internal Medicine

## 2015-05-08 ENCOUNTER — Other Ambulatory Visit: Payer: Self-pay

## 2015-05-08 ENCOUNTER — Inpatient Hospital Stay (HOSPITAL_COMMUNITY): Payer: Medicare Other

## 2015-05-08 ENCOUNTER — Encounter (HOSPITAL_COMMUNITY): Payer: Self-pay

## 2015-05-08 ENCOUNTER — Emergency Department (HOSPITAL_COMMUNITY): Payer: Medicare Other

## 2015-05-08 DIAGNOSIS — S72142D Displaced intertrochanteric fracture of left femur, subsequent encounter for closed fracture with routine healing: Secondary | ICD-10-CM | POA: Diagnosis not present

## 2015-05-08 DIAGNOSIS — Z66 Do not resuscitate: Secondary | ICD-10-CM | POA: Diagnosis present

## 2015-05-08 DIAGNOSIS — Y92009 Unspecified place in unspecified non-institutional (private) residence as the place of occurrence of the external cause: Secondary | ICD-10-CM

## 2015-05-08 DIAGNOSIS — D45 Polycythemia vera: Secondary | ICD-10-CM

## 2015-05-08 DIAGNOSIS — D72829 Elevated white blood cell count, unspecified: Secondary | ICD-10-CM | POA: Diagnosis present

## 2015-05-08 DIAGNOSIS — I341 Nonrheumatic mitral (valve) prolapse: Secondary | ICD-10-CM | POA: Diagnosis not present

## 2015-05-08 DIAGNOSIS — D62 Acute posthemorrhagic anemia: Secondary | ICD-10-CM | POA: Diagnosis not present

## 2015-05-08 DIAGNOSIS — K219 Gastro-esophageal reflux disease without esophagitis: Secondary | ICD-10-CM | POA: Diagnosis not present

## 2015-05-08 DIAGNOSIS — S72002A Fracture of unspecified part of neck of left femur, initial encounter for closed fracture: Secondary | ICD-10-CM

## 2015-05-08 DIAGNOSIS — Z853 Personal history of malignant neoplasm of breast: Secondary | ICD-10-CM | POA: Diagnosis not present

## 2015-05-08 DIAGNOSIS — T148XXA Other injury of unspecified body region, initial encounter: Secondary | ICD-10-CM

## 2015-05-08 DIAGNOSIS — W1830XA Fall on same level, unspecified, initial encounter: Secondary | ICD-10-CM | POA: Diagnosis present

## 2015-05-08 DIAGNOSIS — M6281 Muscle weakness (generalized): Secondary | ICD-10-CM | POA: Diagnosis not present

## 2015-05-08 DIAGNOSIS — D473 Essential (hemorrhagic) thrombocythemia: Secondary | ICD-10-CM | POA: Insufficient documentation

## 2015-05-08 DIAGNOSIS — T148 Other injury of unspecified body region: Secondary | ICD-10-CM | POA: Diagnosis not present

## 2015-05-08 DIAGNOSIS — D75839 Thrombocytosis, unspecified: Secondary | ICD-10-CM | POA: Insufficient documentation

## 2015-05-08 DIAGNOSIS — M25552 Pain in left hip: Secondary | ICD-10-CM | POA: Diagnosis not present

## 2015-05-08 DIAGNOSIS — R55 Syncope and collapse: Secondary | ICD-10-CM | POA: Diagnosis not present

## 2015-05-08 DIAGNOSIS — I10 Essential (primary) hypertension: Secondary | ICD-10-CM | POA: Diagnosis not present

## 2015-05-08 DIAGNOSIS — M79605 Pain in left leg: Secondary | ICD-10-CM | POA: Diagnosis not present

## 2015-05-08 DIAGNOSIS — Z8249 Family history of ischemic heart disease and other diseases of the circulatory system: Secondary | ICD-10-CM | POA: Diagnosis not present

## 2015-05-08 DIAGNOSIS — I951 Orthostatic hypotension: Secondary | ICD-10-CM | POA: Diagnosis present

## 2015-05-08 DIAGNOSIS — Z7982 Long term (current) use of aspirin: Secondary | ICD-10-CM

## 2015-05-08 DIAGNOSIS — S72142A Displaced intertrochanteric fracture of left femur, initial encounter for closed fracture: Principal | ICD-10-CM | POA: Diagnosis present

## 2015-05-08 DIAGNOSIS — Z87891 Personal history of nicotine dependence: Secondary | ICD-10-CM

## 2015-05-08 DIAGNOSIS — S728X9A Other fracture of unspecified femur, initial encounter for closed fracture: Secondary | ICD-10-CM | POA: Diagnosis not present

## 2015-05-08 DIAGNOSIS — Z01818 Encounter for other preprocedural examination: Secondary | ICD-10-CM | POA: Diagnosis not present

## 2015-05-08 DIAGNOSIS — Z4789 Encounter for other orthopedic aftercare: Secondary | ICD-10-CM | POA: Diagnosis not present

## 2015-05-08 HISTORY — PX: FEMUR IM NAIL: SHX1597

## 2015-05-08 LAB — CBC WITH DIFFERENTIAL/PLATELET
Basophils Absolute: 0 10*3/uL (ref 0.0–0.1)
Basophils Relative: 0 %
Eosinophils Absolute: 0.3 10*3/uL (ref 0.0–0.7)
Eosinophils Relative: 2 %
HCT: 43.5 % (ref 36.0–46.0)
Hemoglobin: 13.5 g/dL (ref 12.0–15.0)
Lymphocytes Relative: 7 %
Lymphs Abs: 1 10*3/uL (ref 0.7–4.0)
MCH: 20.1 pg — ABNORMAL LOW (ref 26.0–34.0)
MCHC: 31 g/dL (ref 30.0–36.0)
MCV: 64.9 fL — ABNORMAL LOW (ref 78.0–100.0)
Monocytes Absolute: 0.7 10*3/uL (ref 0.1–1.0)
Monocytes Relative: 5 %
Neutro Abs: 12 10*3/uL — ABNORMAL HIGH (ref 1.7–7.7)
Neutrophils Relative %: 86 %
Platelets: 505 10*3/uL — ABNORMAL HIGH (ref 150–400)
RBC: 6.7 MIL/uL — ABNORMAL HIGH (ref 3.87–5.11)
RDW: 18.1 % — ABNORMAL HIGH (ref 11.5–15.5)
WBC: 14 10*3/uL — ABNORMAL HIGH (ref 4.0–10.5)

## 2015-05-08 LAB — BASIC METABOLIC PANEL
Anion gap: 10 (ref 5–15)
BUN: 22 mg/dL — ABNORMAL HIGH (ref 6–20)
CO2: 24 mmol/L (ref 22–32)
Calcium: 9.7 mg/dL (ref 8.9–10.3)
Chloride: 100 mmol/L — ABNORMAL LOW (ref 101–111)
Creatinine, Ser: 1.01 mg/dL — ABNORMAL HIGH (ref 0.44–1.00)
GFR calc Af Amer: 57 mL/min — ABNORMAL LOW (ref >60)
GFR calc non Af Amer: 49 mL/min — ABNORMAL LOW (ref >60)
Glucose, Bld: 104 mg/dL — ABNORMAL HIGH (ref 65–99)
Potassium: 4.2 mmol/L (ref 3.5–5.1)
Sodium: 134 mmol/L — ABNORMAL LOW (ref 135–145)

## 2015-05-08 LAB — SURGICAL PCR SCREEN
MRSA, PCR: NEGATIVE
STAPHYLOCOCCUS AUREUS: NEGATIVE

## 2015-05-08 LAB — ABO/RH: ABO/RH(D): B POS

## 2015-05-08 SURGERY — INSERTION, INTRAMEDULLARY ROD, FEMUR
Anesthesia: General | Site: Hip | Laterality: Left

## 2015-05-08 MED ORDER — PHENOL 1.4 % MT LIQD: 1.0000 | OROMUCOSAL | Status: DC | PRN

## 2015-05-08 MED ORDER — METOCLOPRAMIDE HCL 5 MG/ML IJ SOLN: 10.0000 mg | Freq: Once | INTRAMUSCULAR | Status: DC | PRN

## 2015-05-08 MED ORDER — METOCLOPRAMIDE HCL 5 MG/ML IJ SOLN: 5.0000 mg | Freq: Three times a day (TID) | INTRAMUSCULAR | Status: DC | PRN

## 2015-05-08 MED ORDER — FENTANYL CITRATE (PF) 100 MCG/2ML IJ SOLN
25.0000 ug | INTRAMUSCULAR | Status: DC | PRN
  Administered 2015-05-08 (×4): 50 ug via INTRAVENOUS

## 2015-05-08 MED ORDER — HYDROCODONE-ACETAMINOPHEN 5-325 MG PO TABS: 1.0000 | ORAL_TABLET | Freq: Four times a day (QID) | ORAL | Status: DC | PRN

## 2015-05-08 MED ORDER — HYDROCODONE-ACETAMINOPHEN 5-325 MG PO TABS
1.0000 | ORAL_TABLET | Freq: Four times a day (QID) | ORAL | Status: DC | PRN
  Administered 2015-05-08 – 2015-05-11 (×5): 1 via ORAL
  Filled 2015-05-08 (×5): qty 1

## 2015-05-08 MED ORDER — ACETAMINOPHEN 650 MG RE SUPP
650.0000 mg | Freq: Four times a day (QID) | RECTAL | Status: DC | PRN
  Filled 2015-05-08: qty 1

## 2015-05-08 MED ORDER — ENOXAPARIN SODIUM 30 MG/0.3ML ~~LOC~~ SOLN
30.0000 mg | SUBCUTANEOUS | Status: DC
  Administered 2015-05-09 – 2015-05-13 (×5): 30 mg via SUBCUTANEOUS
  Filled 2015-05-08 (×5): qty 0.3

## 2015-05-08 MED ORDER — MEPERIDINE HCL 50 MG/ML IJ SOLN: 6.2500 mg | INTRAMUSCULAR | Status: DC | PRN

## 2015-05-08 MED ORDER — HYDROMORPHONE HCL 1 MG/ML IJ SOLN
0.5000 mg | Freq: Once | INTRAMUSCULAR | Status: AC
  Administered 2015-05-08: 0.5 mg via INTRAVENOUS
  Filled 2015-05-08: qty 1

## 2015-05-08 MED ORDER — FENTANYL CITRATE (PF) 100 MCG/2ML IJ SOLN
INTRAMUSCULAR | Status: DC | PRN
  Administered 2015-05-08 (×2): 25 ug via INTRAVENOUS
  Administered 2015-05-08: 50 ug via INTRAVENOUS

## 2015-05-08 MED ORDER — LACTATED RINGERS IV SOLN
INTRAVENOUS | Status: DC | PRN
  Administered 2015-05-08: 18:00:00 via INTRAVENOUS

## 2015-05-08 MED ORDER — AMLODIPINE BESYLATE 2.5 MG PO TABS
1.2500 mg | ORAL_TABLET | Freq: Every day | ORAL | Status: DC
  Administered 2015-05-10 – 2015-05-13 (×4): 1.25 mg via ORAL
  Filled 2015-05-08 (×6): qty 0.5

## 2015-05-08 MED ORDER — METHOCARBAMOL 1000 MG/10ML IJ SOLN
500.0000 mg | Freq: Four times a day (QID) | INTRAVENOUS | Status: DC | PRN
  Administered 2015-05-08: 500 mg via INTRAVENOUS
  Filled 2015-05-08: qty 550
  Filled 2015-05-08: qty 5

## 2015-05-08 MED ORDER — MORPHINE SULFATE (PF) 2 MG/ML IV SOLN: 0.5000 mg | INTRAVENOUS | Status: DC | PRN

## 2015-05-08 MED ORDER — SODIUM CHLORIDE 0.9 % IJ SOLN
INTRAMUSCULAR | Status: AC
  Filled 2015-05-08: qty 10

## 2015-05-08 MED ORDER — ISOPROPYL ALCOHOL 70 % SOLN
Status: AC
  Filled 2015-05-08: qty 480

## 2015-05-08 MED ORDER — PROPOFOL 10 MG/ML IV BOLUS
INTRAVENOUS | Status: DC | PRN
  Administered 2015-05-08: 100 mg via INTRAVENOUS

## 2015-05-08 MED ORDER — LISINOPRIL 20 MG PO TABS
20.0000 mg | ORAL_TABLET | Freq: Every day | ORAL | Status: DC
  Administered 2015-05-10 – 2015-05-13 (×4): 20 mg via ORAL
  Filled 2015-05-08 (×6): qty 1

## 2015-05-08 MED ORDER — CEFAZOLIN SODIUM-DEXTROSE 2-4 GM/100ML-% IV SOLN
2.0000 g | Freq: Four times a day (QID) | INTRAVENOUS | Status: AC
Start: 2015-05-09 — End: 2015-05-09
  Administered 2015-05-09 (×2): 2 g via INTRAVENOUS
  Filled 2015-05-08 (×2): qty 100

## 2015-05-08 MED ORDER — FENTANYL CITRATE (PF) 100 MCG/2ML IJ SOLN
INTRAMUSCULAR | Status: AC
  Filled 2015-05-08: qty 2

## 2015-05-08 MED ORDER — MENTHOL 3 MG MT LOZG: 1.0000 | LOZENGE | OROMUCOSAL | Status: DC | PRN

## 2015-05-08 MED ORDER — METOPROLOL SUCCINATE ER 50 MG PO TB24
50.0000 mg | ORAL_TABLET | Freq: Every day | ORAL | Status: DC
  Administered 2015-05-08 – 2015-05-13 (×5): 50 mg via ORAL
  Filled 2015-05-08 (×6): qty 1

## 2015-05-08 MED ORDER — CHLORHEXIDINE GLUCONATE 4 % EX LIQD
60.0000 mL | Freq: Once | CUTANEOUS | Status: DC
  Filled 2015-05-08: qty 60

## 2015-05-08 MED ORDER — PANTOPRAZOLE SODIUM 40 MG PO TBEC: 40.0000 mg | DELAYED_RELEASE_TABLET | Freq: Every day | ORAL | Status: DC | PRN

## 2015-05-08 MED ORDER — EPHEDRINE SULFATE 50 MG/ML IJ SOLN
INTRAMUSCULAR | Status: DC | PRN
  Administered 2015-05-08 (×3): 5 mg via INTRAVENOUS

## 2015-05-08 MED ORDER — ISOPROPYL ALCOHOL 70 % SOLN
Status: DC | PRN
  Administered 2015-05-08: 1 via TOPICAL

## 2015-05-08 MED ORDER — SUCCINYLCHOLINE CHLORIDE 20 MG/ML IJ SOLN
INTRAMUSCULAR | Status: DC | PRN
  Administered 2015-05-08: 80 mg via INTRAVENOUS

## 2015-05-08 MED ORDER — SODIUM CHLORIDE 0.9 % IV SOLN
INTRAVENOUS | Status: DC
  Administered 2015-05-08: 09:00:00 via INTRAVENOUS

## 2015-05-08 MED ORDER — ASPIRIN EC 81 MG PO TBEC: 81.0000 mg | DELAYED_RELEASE_TABLET | Freq: Every day | ORAL | Status: DC

## 2015-05-08 MED ORDER — LACTATED RINGERS IV SOLN: INTRAVENOUS | Status: DC

## 2015-05-08 MED ORDER — ONDANSETRON HCL 4 MG/2ML IJ SOLN: 4.0000 mg | Freq: Four times a day (QID) | INTRAMUSCULAR | Status: DC | PRN

## 2015-05-08 MED ORDER — SODIUM CHLORIDE 0.9 % IV SOLN
INTRAVENOUS | Status: DC
  Administered 2015-05-08 – 2015-05-13 (×6): via INTRAVENOUS

## 2015-05-08 MED ORDER — ONDANSETRON HCL 4 MG/2ML IJ SOLN
INTRAMUSCULAR | Status: DC | PRN
  Administered 2015-05-08: 4 mg via INTRAVENOUS

## 2015-05-08 MED ORDER — FLUTICASONE PROPIONATE 50 MCG/ACT NA SUSP
2.0000 | NASAL | Status: DC | PRN
  Filled 2015-05-08: qty 16

## 2015-05-08 MED ORDER — EPHEDRINE SULFATE 50 MG/ML IJ SOLN
INTRAMUSCULAR | Status: AC
  Filled 2015-05-08: qty 1

## 2015-05-08 MED ORDER — HYDROCHLOROTHIAZIDE 12.5 MG PO CAPS
12.5000 mg | ORAL_CAPSULE | Freq: Every day | ORAL | Status: DC
  Administered 2015-05-10 – 2015-05-13 (×4): 12.5 mg via ORAL
  Filled 2015-05-08 (×6): qty 1

## 2015-05-08 MED ORDER — METOCLOPRAMIDE HCL 5 MG PO TABS
5.0000 mg | ORAL_TABLET | Freq: Three times a day (TID) | ORAL | Status: DC | PRN
  Filled 2015-05-08: qty 2

## 2015-05-08 MED ORDER — POTASSIUM CHLORIDE ER 10 MEQ PO TBCR
10.0000 meq | EXTENDED_RELEASE_TABLET | Freq: Every day | ORAL | Status: DC
  Administered 2015-05-09 – 2015-05-13 (×5): 10 meq via ORAL
  Filled 2015-05-08 (×6): qty 1

## 2015-05-08 MED ORDER — CEFAZOLIN SODIUM-DEXTROSE 2-4 GM/100ML-% IV SOLN
INTRAVENOUS | Status: AC
  Filled 2015-05-08: qty 100

## 2015-05-08 MED ORDER — LIDOCAINE HCL (CARDIAC) 20 MG/ML IV SOLN
INTRAVENOUS | Status: DC | PRN
  Administered 2015-05-08: 50 mg via INTRAVENOUS

## 2015-05-08 MED ORDER — CALCIUM CARBONATE-VITAMIN D 500-200 MG-UNIT PO TABS
1.0000 | ORAL_TABLET | Freq: Every day | ORAL | Status: DC
Start: 2015-05-08 — End: 2015-05-13
  Administered 2015-05-09 – 2015-05-13 (×5): 1 via ORAL
  Filled 2015-05-08 (×6): qty 1

## 2015-05-08 MED ORDER — PROPOFOL 10 MG/ML IV BOLUS
INTRAVENOUS | Status: AC
  Filled 2015-05-08: qty 20

## 2015-05-08 MED ORDER — POVIDONE-IODINE 10 % EX SWAB: 2.0000 "application " | Freq: Once | CUTANEOUS | Status: DC

## 2015-05-08 MED ORDER — ADULT MULTIVITAMIN W/MINERALS CH
1.0000 | ORAL_TABLET | Freq: Every day | ORAL | Status: DC
  Administered 2015-05-09 – 2015-05-13 (×5): 1 via ORAL
  Filled 2015-05-08 (×6): qty 1

## 2015-05-08 MED ORDER — SODIUM CHLORIDE 0.9 % IR SOLN
Status: DC | PRN
Start: 2015-05-08 — End: 2015-05-08
  Administered 2015-05-08: 1000 mL

## 2015-05-08 MED ORDER — DICYCLOMINE HCL 10 MG PO CAPS
10.0000 mg | ORAL_CAPSULE | Freq: Four times a day (QID) | ORAL | Status: DC | PRN
  Administered 2015-05-10 – 2015-05-11 (×2): 10 mg via ORAL
  Filled 2015-05-08 (×3): qty 1

## 2015-05-08 MED ORDER — MORPHINE SULFATE (PF) 4 MG/ML IV SOLN
3.0000 mg | INTRAVENOUS | Status: DC | PRN
  Administered 2015-05-08: 3 mg via INTRAVENOUS
  Filled 2015-05-08: qty 1

## 2015-05-08 MED ORDER — LIDOCAINE HCL (CARDIAC) 20 MG/ML IV SOLN
INTRAVENOUS | Status: AC
  Filled 2015-05-08: qty 5

## 2015-05-08 MED ORDER — CEFAZOLIN SODIUM-DEXTROSE 2-4 GM/100ML-% IV SOLN
2.0000 g | INTRAVENOUS | Status: AC
  Administered 2015-05-08: 2 g via INTRAVENOUS
  Filled 2015-05-08: qty 100

## 2015-05-08 MED ORDER — ACETAMINOPHEN 325 MG PO TABS
650.0000 mg | ORAL_TABLET | Freq: Four times a day (QID) | ORAL | Status: DC | PRN
  Administered 2015-05-09: 650 mg via ORAL
  Filled 2015-05-08 (×2): qty 2

## 2015-05-08 SURGICAL SUPPLY — 37 items
BAG SPEC THK2 15X12 ZIP CLS (MISCELLANEOUS) ×1
BAG ZIPLOCK 12X15 (MISCELLANEOUS) ×2 IMPLANT
BIT DRILL 4.2 (DRILL) IMPLANT
BLADE HELICAL TFNA 90 HIP (Anchor) ×1 IMPLANT
BLADE HELICAL TFNA 90MM HIP (Anchor) ×1 IMPLANT
CHLORAPREP W/TINT 26ML (MISCELLANEOUS) ×3 IMPLANT
COVER PERINEAL POST (MISCELLANEOUS) ×3 IMPLANT
DRAPE C-ARM 42X120 X-RAY (DRAPES) ×3 IMPLANT
DRAPE C-ARMOR (DRAPES) ×3 IMPLANT
DRAPE ORTHO SPLIT 77X108 STRL (DRAPES) ×3
DRAPE STERI IOBAN 125X83 (DRAPES) ×3 IMPLANT
DRAPE SURG ORHT 6 SPLT 77X108 (DRAPES) IMPLANT
DRAPE U-SHAPE 47X51 STRL (DRAPES) ×6 IMPLANT
DRILL 4.2 (DRILL) ×3
DRSG AQUACEL AG ADV 3.5X 6 (GAUZE/BANDAGES/DRESSINGS) ×2 IMPLANT
DRSG MEPILEX BORDER 4X4 (GAUZE/BANDAGES/DRESSINGS) ×9 IMPLANT
ELECT BLADE TIP CTD 4 INCH (ELECTRODE) ×3 IMPLANT
GAUZE SPONGE 4X4 12PLY STRL (GAUZE/BANDAGES/DRESSINGS) ×3 IMPLANT
GLOVE BIO SURGEON STRL SZ8.5 (GLOVE) ×6 IMPLANT
GLOVE BIOGEL PI IND STRL 8.5 (GLOVE) ×1 IMPLANT
GLOVE BIOGEL PI INDICATOR 8.5 (GLOVE) ×2
GOWN SPEC L3 XXLG W/TWL (GOWN DISPOSABLE) ×6 IMPLANT
GUIDEWIRE 3.2X400 (WIRE) ×4 IMPLANT
KIT BASIN OR (CUSTOM PROCEDURE TRAY) ×3 IMPLANT
LIQUID BAND (GAUZE/BANDAGES/DRESSINGS) ×4 IMPLANT
MANIFOLD NEPTUNE II (INSTRUMENTS) ×3 IMPLANT
MARKER SKIN DUAL TIP RULER LAB (MISCELLANEOUS) ×3 IMPLANT
NAIL CANN TFNA TI 10MM/130DEG (Nail) ×2 IMPLANT
PACK TOTAL JOINT (CUSTOM PROCEDURE TRAY) ×3 IMPLANT
REAMER ROD DEEP FLUTE 2.5X950 (INSTRUMENTS) ×2 IMPLANT
SCREW LOCKING 5.0X38MM (Screw) ×2 IMPLANT
SUT MNCRL AB 3-0 PS2 18 (SUTURE) ×6 IMPLANT
SUT VIC AB 1 CT1 27 (SUTURE) ×6
SUT VIC AB 1 CT1 27XBRD ANTBC (SUTURE) ×2 IMPLANT
SUT VIC AB 2-0 CT1 27 (SUTURE) ×6
SUT VIC AB 2-0 CT1 27XBRD (SUTURE) ×2 IMPLANT
YANKAUER SUCT BULB TIP NO VENT (SUCTIONS) ×3 IMPLANT

## 2015-05-08 NOTE — Discharge Instructions (Signed)
°Dr. Elisa Sorlie °Adult Hip & Knee Specialist °[facility] °[address] °[place], [place] [address] °[phone] ° ° °POSTOPERATIVE DIRECTIONS ° ° ° °Hip Rehabilitation, Guidelines Following Surgery  ° °WEIGHT BEARING °Partial weight bearing with assist device as directed.  touch down weight bearing (30%) ° ° °HOME CARE INSTRUCTIONS  °Remove items at home which could result in a fall. This includes throw rugs or furniture in walking pathways.  °Continue medications as instructed at time of discharge. °· You may have some home medications which will be placed on hold until you complete the course of blood thinner medication. °· 4 days after discharge, you may start showering. No tub baths or soaking your incisions. °Do not put on socks or shoes without following the instructions of your caregivers.   °Sit on chairs with arms. Use the chair arms to help push yourself up when arising.  °Arrange for the use of a toilet seat elevator so you are not sitting low.  °· Walk with walker as instructed.  °You may resume a sexual relationship in one month or when given the OK by your caregiver.  °Use walker as long as suggested by your caregivers.  °Avoid periods of inactivity such as sitting longer than an hour when not asleep. This helps prevent blood clots.  °You may return to work once you are cleared by your surgeon.  °Do not drive a car for 6 weeks or until released by your surgeon.  °Do not drive while taking narcotics.  °Wear elastic stockings for two weeks following surgery during the day but you may remove then at night.  °Make sure you keep all of your appointments after your operation with all of your doctors and caregivers. You should call the office at the above phone number and make an appointment for approximately two weeks after the date of your surgery. °Please pick up a stool softener and laxative for home use as long as you are requiring pain medications. °· ICE to the affected  hip every three hours for 30 minutes at a time and then as needed for pain and swelling. Continue to use ice on the hip for pain and swelling from surgery. You may notice swelling that will progress down to the foot and ankle.  This is normal after surgery.  Elevate the leg when you are not up walking on it.   °It is important for you to complete the blood thinner medication as prescribed by your doctor. °· Continue to use the breathing machine which will help keep your temperature down.  It is common for your temperature to cycle up and down following surgery, especially at night when you are not up moving around and exerting yourself.  The breathing machine keeps your lungs expanded and your temperature down. ° °RANGE OF MOTION AND STRENGTHENING EXERCISES  °These exercises are designed to help you keep full movement of your hip joint. Follow your caregiver's or physical therapist's instructions. Perform all exercises about fifteen times, three times per day or as directed. Exercise both hips, even if you have had only one joint replacement. These exercises can be done on a training (exercise) mat, on the floor, on a table or on a bed. Use whatever works the best and is most comfortable for you. Use music or television while you are exercising so that the exercises are a pleasant break in your day. This will make your life better with the exercises acting as a break in routine you   can look forward to.  °Lying on your back, slowly slide your foot toward your buttocks, raising your knee up off the floor. Then slowly slide your foot back down until your leg is straight again.  °Lying on your back spread your legs as far apart as you can without causing discomfort.  °Lying on your side, raise your upper leg and foot straight up from the floor as far as is comfortable. Slowly lower the leg and repeat.  °Lying on your back, tighten up the muscle in the front of your thigh (quadriceps muscles). You can do this by keeping  your leg straight and trying to raise your heel off the floor. This helps strengthen the largest muscle supporting your knee.  °Lying on your back, tighten up the muscles of your buttocks both with the legs straight and with the knee bent at a comfortable angle while keeping your heel on the floor.  ° °SKILLED REHAB INSTRUCTIONS: °If the patient is transferred to a skilled rehab facility following release from the hospital, a list of the current medications will be sent to the facility for the patient to continue.  When discharged from the skilled rehab facility, please have the facility set up the patient's Home Health Physical Therapy prior to being released. Also, the skilled facility will be responsible for providing the patient with their medications at time of release from the facility to include their pain medication and their blood thinner medication. If the patient is still at the rehab facility at time of the two week follow up appointment, the skilled rehab facility will also need to assist the patient in arranging follow up appointment in our office and any transportation needs. ° °MAKE SURE YOU:  °Understand these instructions.  °Will watch your condition.  °Will get help right away if you are not doing well or get worse. ° °Pick up stool softner and laxative for home use following surgery while on pain medications. °Daily dry dressing changes as needed. °In 4 days, you may remove your dressings and begin taking showers - no tub baths or soaking the incisions. °Continue to use ice for pain and swelling after surgery. °Do not use any lotions or creams on the incision until instructed by your surgeon. ° ° °

## 2015-05-08 NOTE — ED Notes (Signed)
Per EMS, pt from home.  Pt fell this morning landing on left hip.  Pt was coming out of back door and lost footing going down steps.  Pt left leg bent under her.  Family able to call 911. Pt has hx of hip in past.  Unsure of shortening or rotation.  Pt has htn and has not had her meds.  BP elevated in route.  Vitals:  197/91, hr 80, resp 16, 96% ra.

## 2015-05-08 NOTE — ED Notes (Signed)
Patient has questions for anesthesiologist. Unable to sign consent for surgery at this time.

## 2015-05-08 NOTE — ED Notes (Signed)
MD at bedside. 

## 2015-05-08 NOTE — H&P (Signed)
[facility] History and Physical  KELBIE DOENGES [id number] DOB: [birth year] DOA: 05/08/2015  Referring physician: Dr. Virgel Manifold, EDP PCP: [name], MD  Specialists: Dr. Acie Fredrickson, cardiology  Chief Complaint: Fall, left hip pain  HPI: [name] is a 82 y.o. female  With a history of hypertension, mitral valve prolapse, polycythemia, that presented to the emergency department after sustaining a fall and having left leg pain. Patient states her leg simply "gave out" after going down one stair. She denies any dizziness prior to the episode. Patient does not use a walker or cane for ambulation. She does live alone. Currently patient denies any chest pain, shortness breath, abdominal pain, dizziness or headache. She does complain of pain left hip which radiates to her left thigh. In the emergency department, x-rays did show intertrochanteric fracture of the left femur. [facility] called for admission.  Review of Systems:  Constitutional: Denies fever, chills, diaphoresis, appetite change and fatigue.  HEENT: Denies photophobia, eye pain, redness, hearing loss, ear pain, congestion, sore throat, rhinorrhea, sneezing, mouth sores, trouble swallowing, neck pain, neck stiffness and tinnitus.   Respiratory: Denies SOB, DOE, cough, chest tightness,  and wheezing.   Cardiovascular: Denies chest pain, palpitations and leg swelling.  Gastrointestinal: Denies nausea, vomiting, abdominal pain, diarrhea, constipation, blood in stool and abdominal distention.  Genitourinary: Denies dysuria, urgency, frequency, hematuria, flank pain and difficulty urinating.  Musculoskeletal: Complains of left leg pain. Skin: Denies pallor, rash and wound.  Neurological: Denies dizziness, seizures, syncope, weakness, light-headedness, numbness and headaches.  Hematological: Denies adenopathy. Easy bruising, personal or family bleeding history  Psychiatric/Behavioral: Denies suicidal ideation, mood changes,  confusion, nervousness, sleep disturbance and agitation  Past Medical History  Diagnosis Date  . MVP (mitral valve prolapse)   . Hypertension   . Chest tightness   . Palpitation   . PVC's (premature ventricular contractions)   . Breast cancer ([place])     right  . Polycythemia   . Chronic gastritis   . Diverticulosis   . Candida esophagitis ([place])   . Arthritis    Past Surgical History  Procedure Laterality Date  . Tonsillectomy    . Appendectomy    . Cardiovascular stress test  12/23/2000    EF 70%  . Transthoracic echocardiogram  03/05/2010    EF 55-60%  . Abdominal hysterectomy      ovaries taken  . Breast lumpectomy      right  . Elbow fracture surgery      left  . Cataract extraction      bilateral  . Hematoma evacuation      right   Social History:  reports that she quit smoking about 34 years ago. She has never used smokeless tobacco. She reports that she drinks alcohol. She reports that she does not use illicit drugs.   Allergies  Allergen Reactions  . Sulfur Other (See Comments)    Pt doesn't remember reaction.    Family History  Problem Relation Age of Onset  . Stomach cancer Mother   . Hypertension Father   . Stroke Father   . Hypertension Sister   . Hypertension Brother   . Prostate cancer Brother   . Colon cancer Neg Hx   . Prostate cancer Father   . Diabetes Brother     Prior to Admission medications   Medication Sig Start Date End Date Taking? Authorizing Provider  amLODipine (NORVASC) 2.5 MG tablet Take one-half tablet by mouth daily 01/21/15  Yes Thayer Headings, MD  anagrelide (AGRYLIN) 0.5 MG capsule TAKE 2 CAPSULES IN THE MORNING AND 1 CAPSULE IN THE AFTERNOON. 04/14/15  Yes Truitt Merle, MD  aspirin 81 MG tablet Take 81 mg by mouth daily.     Yes Historical Provider, MD  Calcium Carbonate-Vitamin D (CALCIUM 500/D PO) Take 1 capsule by mouth daily.   Yes Historical Provider, MD  Cetirizine HCl (ZYRTEC PO) Take 1 tablet by mouth as needed (AS  NEEDED FOR ALLERGIES).    Yes Historical Provider, MD  Cholecalciferol (VITAMIN D3) 1000 UNITS CAPS Take 2,000 mg by mouth 2 (two) times daily.    Yes Historical Provider, MD  dicyclomine (BENTYL) 10 MG capsule Take 1 capsule (10 mg total) by mouth 4 (four) times daily as needed for spasms. 02/19/14  Yes Lafayette Dragon, MD  fluticasone [facility]) 50 MCG/ACT nasal spray Place 2 sprays into the nose as needed for allergies or rhinitis.    Yes Historical Provider, MD  hydrochlorothiazide (MICROZIDE) 12.5 MG capsule Take 1 capsule (12.5 mg total) by mouth daily. 01/21/15  Yes Thayer Headings, MD  lisinopril (PRINIVIL,ZESTRIL) 20 MG tablet TAKE 1 TABLET EACH DAY. 03/04/15  Yes Thayer Headings, MD  loperamide (IMODIUM) 1 MG/5ML solution Take 5 mLs (1 mg total) by mouth daily. 05/16/13  Yes Lafayette Dragon, MD  metoprolol succinate (TOPROL-XL) 50 MG 24 hr tablet Take 1 tablet (50 mg total) by mouth daily. 12/03/14  Yes Thayer Headings, MD  Multiple Vitamin (MULTIVITAMIN) tablet Take 1 tablet by mouth daily.     Yes Historical Provider, MD  pantoprazole (PROTONIX) 40 MG tablet Take 40 mg by mouth as needed (AS NEEDED FOR STOMACH).    Yes Historical Provider, MD  potassium chloride (K-DUR) 10 MEQ tablet Take 1 tablet (10 mEq total) by mouth daily. 02/18/15  Yes Thayer Headings, MD  Probiotic Product ([place] PO) Take 1 tablet by mouth daily.   Yes Historical Provider, MD  colestipol (COLESTID) 1 G tablet Take 1 tablet (1 g total) by mouth daily. Patient not taking: Reported on 05/08/2015 08/02/13   Lafayette Dragon, MD  cyclobenzaprine (FLEXERIL) 5 MG tablet Take 1 tablet (5 mg total) by mouth 3 (three) times daily as needed for muscle spasms. Patient not taking: Reported on 05/08/2015 04/22/14   Truitt Merle, MD   Physical Exam: Filed Vitals:   05/08/15 1200 05/08/15 1230  BP: 151/60 154/67  Pulse: 75 73  Temp:    Resp: 15 14     General: Well developed, well nourished, NAD, appears stated age  HEENT:  NCAT, PERRLA, EOMI, Anicteic Sclera, mucous membranes moist.   Neck: Supple, no JVD, no masses  Cardiovascular: S1 S2 auscultated, 1/6 SEM, Regular rate and rhythm.  Respiratory: Clear to auscultation bilaterally with equal chest rise  Abdomen: Soft, nontender, nondistended, + bowel sounds  Extremities: warm dry without cyanosis clubbing or edema. Left lower extremity externally rotated and shortened, TTP.  Neuro: AAOx3, cranial nerves grossly intact. Strength normal in upper ext, not tested in lower extremities due to pain.   Skin: Without rashes exudates or nodules  Psych: Normal affect and demeanor with intact judgement and insight  Labs on Admission:  Basic Metabolic Panel:  Recent Labs Lab 05/08/15 1054  NA 134*  K 4.2  CL 100*  CO2 24  GLUCOSE 104*  BUN 22*  CREATININE 1.01*  CALCIUM 9.7   Liver Function Tests: No results for input(s): AST, ALT, ALKPHOS, BILITOT, PROT, ALBUMIN in the last 168  hours. No results for input(s): LIPASE, AMYLASE in the last 168 hours. No results for input(s): AMMONIA in the last 168 hours. CBC:  Recent Labs Lab 05/08/15 1054  WBC 14.0*  NEUTROABS 12.0*  HGB 13.5  HCT 43.5  MCV 64.9*  PLT 505*   Cardiac Enzymes: No results for input(s): CKTOTAL, CKMB, CKMBINDEX, TROPONINI in the last 168 hours.  BNP (last 3 results) No results for input(s): BNP in the last 8760 hours.  ProBNP (last 3 results) No results for input(s): PROBNP in the last 8760 hours.  CBG: No results for input(s): GLUCAP in the last 168 hours.  Radiological Exams on Admission: Dg Chest 1 View  05/08/2015  CLINICAL DATA:  Preop for left hip fracture. EXAM: CHEST 1 VIEW COMPARISON:  12/31/2005 FINDINGS: Normal heart size and mediastinal contours. No acute infiltrate or edema. No effusion or pneumothorax. L1 superior endplate fracture which is chronic based on 2016 lumbar spine MRI. Remote lateral right seventh rib fracture. No acute osseous findings.  IMPRESSION: No evidence of active disease. Electronically Signed   By: Monte Fantasia M.D.   On: 05/08/2015 09:41   Dg Knee Left Port  05/08/2015  CLINICAL DATA:  Intertrochanteric left femur fracture. Initial encounter. EXAM: PORTABLE LEFT KNEE - 1-2 VIEW COMPARISON:  None. FINDINGS: Limited patient positioning due to ipsilateral hip fracture. No evidence of fracture or subluxation at the knee. No visualized joint effusion but limited by obliquity. Osteopenia. IMPRESSION: Limited study with no evidence of fracture at the knee. Electronically Signed   By: Monte Fantasia M.D.   On: 05/08/2015 12:40   Dg Hip Unilat With Pelvis 2-3 Views Left  05/08/2015  CLINICAL DATA:  Fall with left hip pain.  Initial encounter. EXAM: DG HIP (WITH OR WITHOUT PELVIS) 2-3V LEFT COMPARISON:  None. FINDINGS: Acute intertrochanteric left femur fracture with varus angulation. Remote left obturator ring fracture with healed distortion. No evidence of acute pelvic fracture. Located hips. IMPRESSION: 1. Angulated intertrochanteric left femur fracture. 2. Remote left obturator ring fractures. Electronically Signed   By: Monte Fantasia M.D.   On: 05/08/2015 09:35    EKG: Independently reviewed. Sinus rhythm, rate 77  Assessment/Plan  Left intertrochanteric femur fracture -Status post fall -X-ray showed angulated intertrochanteric left femur fracture -Orthopedics, Dr. Lyla Glassing, consulted and appreciated. Plan for surgery later this evening. -Continue pain control -Given patient's age and comorbidities, she is low to moderate risk. -Spoke with Dr. Acie Fredrickson, patient's cardiologist, patient is felt to be low risk for surgery. -Patient does live alone, will likely need SNF placement.   Leukocytosis -Likely reactive, continue to monitor CBC -Patient currently afebrile -Denies any urinary symptoms, chest x-ray unremarkable  Essential hypertension/Mitral valve prolpase -Continue amlodipine, lisinopril,  HCTZ  GERD -Continue PPI  History of breast cancer -Stable  Polycythemia vera/thombocytosis  -Stable -Will hold agrylin for surgery  DVT prophylaxis: SCDs  Code Status: DNR  Condition: Guarded   Family Communication: Daughter at bedside. Admission, patients condition and plan of care including tests being ordered have been discussed with the patient and daughter who indicate understanding and agree with the plan and Code Status.  Disposition Plan: Admitted. Pending surgery today.   Time spent: 65 minutes  Kemora Pinard D.O. [facility] Pager [phone]  If 7PM-7AM, please contact night-coverage [email or web address] Password [email or web address] 05/08/2015, 1:34 PM

## 2015-05-08 NOTE — ED Provider Notes (Signed)
CSN: [id number]     Arrival date & time 05/08/15  0759 History   First MD Initiated Contact with Patient 05/08/15 0809     Chief Complaint  Patient presents with  . Fall  . Hip Pain     (Consider location/radiation/quality/duration/timing/severity/associated sxs/prior Treatment) HPI   23y female with left hip pain. Patient fall shortly before arrival. She is going out of her porch to get the paper. There is one step down. She states that her leg "gave out." She fell onto her left side and said severe persistent left hip pain since then. She does not think she struck her head. Denies any significant acute headache, neck or back pain. Reports prior pelvic fractures which were managed nonoperatively. She does not take any blood thinners. She lives alone. Does not use any assistive devices for ambulation at baseline. Daughters at bedside reports that she is at her baseline mental status. She has been nothing by mouth since last night.   Past Medical History  Diagnosis Date  . MVP (mitral valve prolapse)   . Hypertension   . Chest tightness   . Palpitation   . PVC's (premature ventricular contractions)   . Breast cancer ([place])     right  . Polycythemia   . Chronic gastritis   . Diverticulosis   . Candida esophagitis ([place])   . Arthritis    Past Surgical History  Procedure Laterality Date  . Tonsillectomy    . Appendectomy    . Cardiovascular stress test  12/23/2000    EF 70%  . Transthoracic echocardiogram  03/05/2010    EF 55-60%  . Abdominal hysterectomy      ovaries taken  . Breast lumpectomy      right  . Elbow fracture surgery      left  . Cataract extraction      bilateral  . Hematoma evacuation      right   Family History  Problem Relation Age of Onset  . Stomach cancer Mother   . Hypertension Father   . Stroke Father   . Hypertension Sister   . Hypertension Brother   . Prostate cancer Brother   . Colon cancer Neg Hx   . Prostate cancer Father   . Diabetes  Brother    Social History  Substance Use Topics  . Smoking status: Former Smoker    Quit date: 08/24/1980  . Smokeless tobacco: Never Used  . Alcohol Use: Yes     Comment: occasional   OB History    No data available     Review of Systems  All systems reviewed and negative, other than as noted in HPI.   Allergies  Sulfur  Home Medications   Prior to Admission medications   Medication Sig Start Date End Date Taking? Authorizing Provider  amLODipine (NORVASC) 2.5 MG tablet Take one-half tablet by mouth daily 01/21/15  Yes Thayer Headings, MD  anagrelide (AGRYLIN) 0.5 MG capsule TAKE 2 CAPSULES IN THE MORNING AND 1 CAPSULE IN THE AFTERNOON. 04/14/15  Yes Truitt Merle, MD  aspirin 81 MG tablet Take 81 mg by mouth daily.     Yes Historical Provider, MD  Calcium Carbonate-Vitamin D (CALCIUM 500/D PO) Take 1 capsule by mouth daily.   Yes Historical Provider, MD  Cetirizine HCl (ZYRTEC PO) Take 1 tablet by mouth as needed (AS NEEDED FOR ALLERGIES).    Yes Historical Provider, MD  Cholecalciferol (VITAMIN D3) 1000 UNITS CAPS Take 2,000 mg by mouth 2 (two) times  daily.    Yes Historical Provider, MD  dicyclomine (BENTYL) 10 MG capsule Take 1 capsule (10 mg total) by mouth 4 (four) times daily as needed for spasms. 02/19/14  Yes Lafayette Dragon, MD  fluticasone [facility]) 50 MCG/ACT nasal spray Place 2 sprays into the nose as needed for allergies or rhinitis.    Yes Historical Provider, MD  hydrochlorothiazide (MICROZIDE) 12.5 MG capsule Take 1 capsule (12.5 mg total) by mouth daily. 01/21/15  Yes Thayer Headings, MD  lisinopril (PRINIVIL,ZESTRIL) 20 MG tablet TAKE 1 TABLET EACH DAY. 03/04/15  Yes Thayer Headings, MD  loperamide (IMODIUM) 1 MG/5ML solution Take 5 mLs (1 mg total) by mouth daily. 05/16/13  Yes Lafayette Dragon, MD  metoprolol succinate (TOPROL-XL) 50 MG 24 hr tablet Take 1 tablet (50 mg total) by mouth daily. 12/03/14  Yes Thayer Headings, MD  Multiple Vitamin (MULTIVITAMIN) tablet Take 1  tablet by mouth daily.     Yes Historical Provider, MD  pantoprazole (PROTONIX) 40 MG tablet Take 40 mg by mouth as needed (AS NEEDED FOR STOMACH).    Yes Historical Provider, MD  potassium chloride (K-DUR) 10 MEQ tablet Take 1 tablet (10 mEq total) by mouth daily. 02/18/15  Yes Thayer Headings, MD  Probiotic Product ([place] PO) Take 1 tablet by mouth daily.   Yes Historical Provider, MD  colestipol (COLESTID) 1 G tablet Take 1 tablet (1 g total) by mouth daily. Patient not taking: Reported on 05/08/2015 08/02/13   Lafayette Dragon, MD  cyclobenzaprine (FLEXERIL) 5 MG tablet Take 1 tablet (5 mg total) by mouth 3 (three) times daily as needed for muscle spasms. Patient not taking: Reported on 05/08/2015 04/22/14   Truitt Merle, MD   BP 211/78 mmHg  Pulse 78  Temp(Src) 98.4 F (36.9 C) (Oral)  Resp 18  SpO2 97% Physical Exam  Constitutional: She appears well-developed and well-nourished. No distress.  HENT:  Head: Normocephalic and atraumatic.  Eyes: Conjunctivae are normal. Right eye exhibits no discharge. Left eye exhibits no discharge.  Neck: Neck supple.  Cardiovascular: Normal rate, regular rhythm and normal heart sounds.  Exam reveals no gallop and no friction rub.   No murmur heard. Pulmonary/Chest: Effort normal and breath sounds normal. No respiratory distress.  Abdominal: Soft. She exhibits no distension. There is no tenderness.  Musculoskeletal: She exhibits no edema or tenderness.  Left lower extremity is shortened and externally rotated. Exquisite pain with palpation and attempted range of motion of the left hip. No significant tenderness in the mid to distal thigh or knee. Can actively range the knee but does complain a lot of pain in her hip when doing so.. Closed injury. Neurovascular intact.  Neurological: She is alert.  Skin: Skin is warm and dry.  Psychiatric: She has a normal mood and affect. Her behavior is normal. Thought content normal.  Nursing note and vitals  reviewed.   ED Course  Procedures (including critical care time) Labs Review Labs Reviewed  CBC WITH DIFFERENTIAL/PLATELET - Abnormal; Notable for the following:    WBC 14.0 (*)    RBC 6.70 (*)    MCV 64.9 (*)    MCH 20.1 (*)    RDW 18.1 (*)    Platelets 505 (*)    Neutro Abs 12.0 (*)    All other components within normal limits  BASIC METABOLIC PANEL - Abnormal; Notable for the following:    Sodium 134 (*)    Chloride 100 (*)  Glucose, Bld 104 (*)    BUN 22 (*)    Creatinine, Ser 1.01 (*)    GFR calc non Af Amer 49 (*)    GFR calc Af Amer 57 (*)    All other components within normal limits    Imaging Review Dg Chest 1 View  05/08/2015  CLINICAL DATA:  Preop for left hip fracture. EXAM: CHEST 1 VIEW COMPARISON:  12/31/2005 FINDINGS: Normal heart size and mediastinal contours. No acute infiltrate or edema. No effusion or pneumothorax. L1 superior endplate fracture which is chronic based on 2016 lumbar spine MRI. Remote lateral right seventh rib fracture. No acute osseous findings. IMPRESSION: No evidence of active disease. Electronically Signed   By: Monte Fantasia M.D.   On: 05/08/2015 09:41   Dg Hip Unilat With Pelvis 2-3 Views Left  05/08/2015  CLINICAL DATA:  Fall with left hip pain.  Initial encounter. EXAM: DG HIP (WITH OR WITHOUT PELVIS) 2-3V LEFT COMPARISON:  None. FINDINGS: Acute intertrochanteric left femur fracture with varus angulation. Remote left obturator ring fracture with healed distortion. No evidence of acute pelvic fracture. Located hips. IMPRESSION: 1. Angulated intertrochanteric left femur fracture. 2. Remote left obturator ring fractures. Electronically Signed   By: Monte Fantasia M.D.   On: 05/08/2015 09:35   I have personally reviewed and evaluated these images and lab results as part of my medical decision-making.   EKG Interpretation None      MDM   Final diagnoses:  Fracture, intertrochanteric, left femur ([place])    88yF with L hip pain after  mechanical fall. -Clinically has fx. Severe pain with any attempted ROM. Shortened/externally rotated. -Place IV, pain meds, NPO, imaging.   -Unfortunately, does have intertrochanteric fx. Closed injury. NVI. Will discuss with ortho. Medicine admit.       Virgel Manifold, MD 05/16/15 1209

## 2015-05-08 NOTE — ED Notes (Signed)
Bed: [id number] Expected date:  Expected time:  Means of arrival:  Comments: Fall 82yo

## 2015-05-08 NOTE — Anesthesia Postprocedure Evaluation (Signed)
Anesthesia Post Note  Patient: [name]  Procedure(s) Performed: Procedure(s) (LRB): INTERTROCHANTERIC FEMORAL NAIL (Left)  Patient location during evaluation: PACU Anesthesia Type: General Level of consciousness: awake and alert Pain management: pain level controlled Vital Signs Assessment: post-procedure vital signs reviewed and stable Respiratory status: spontaneous breathing, nonlabored ventilation, respiratory function stable and patient connected to nasal cannula oxygen Cardiovascular status: blood pressure returned to baseline and stable Postop Assessment: no signs of nausea or vomiting Anesthetic complications: no    Last Vitals:  Filed Vitals:   05/08/15 2137 05/08/15 2238  BP: 127/57 95/45  Pulse: 84 77  Temp: 36.6 C 36.6 C  Resp: 22 20    Last Pain:  Filed Vitals:   05/08/15 2256  PainSc: 2                  Montez Hageman

## 2015-05-08 NOTE — Anesthesia Preprocedure Evaluation (Addendum)
Anesthesia Evaluation  Patient identified by MRN, date of birth, ID band Patient awake    Reviewed: Allergy & Precautions, NPO status , Patient's Chart, lab work & pertinent test results  Airway Mallampati: II  TM Distance: >3 FB Neck ROM: Full    Dental no notable dental hx.    Pulmonary former smoker,    Pulmonary exam normal breath sounds clear to auscultation       Cardiovascular hypertension, Pt. on medications negative cardio ROS Normal cardiovascular exam Rhythm:Regular Rate:Normal     Neuro/Psych negative neurological ROS  negative psych ROS   GI/Hepatic negative GI ROS, Neg liver ROS,   Endo/Other  negative endocrine ROS  Renal/GU negative Renal ROS  negative genitourinary   Musculoskeletal negative musculoskeletal ROS (+)   Abdominal   Peds negative pediatric ROS (+)  Hematology Polycythemia vera/thombocytosis    Anesthesia Other Findings   Reproductive/Obstetrics negative OB ROS                            Anesthesia Physical Anesthesia Plan  ASA: II  Anesthesia Plan: General   Post-op Pain Management:    Induction: Intravenous  Airway Management Planned: Oral ETT and LMA  Additional Equipment:   Intra-op Plan:   Post-operative Plan: Extubation in OR  Informed Consent: I have reviewed the patients History and Physical, chart, labs and discussed the procedure including the risks, benefits and alternatives for the proposed anesthesia with the patient or authorized representative who has indicated his/her understanding and acceptance.   Dental advisory given  Plan Discussed with: CRNA  Anesthesia Plan Comments:         Anesthesia Quick Evaluation

## 2015-05-08 NOTE — Brief Op Note (Signed)
05/08/2015  7:20 PM  PATIENT:  [name]  82 y.o. female  PRE-OPERATIVE DIAGNOSIS:  left intertrochanteric femur fracture  POST-OPERATIVE DIAGNOSIS:  left intertrochanteric femur fracture  PROCEDURE:  Procedure(s): INTERTROCHANTERIC FEMORAL NAIL (Left)  SURGEON:  Surgeon(s) and Role:    * Rod Can, MD - Primary  PHYSICIAN ASSISTANT: Merla Riches, PA-A  ASSISTANTS: staff   ANESTHESIA:   general  EBL:  Total I/O In: 200 [I.V.:200] Out: 200 [Blood:200]  BLOOD ADMINISTERED:none  DRAINS: none   LOCAL MEDICATIONS USED:  NONE  SPECIMEN:  No Specimen  DISPOSITION OF SPECIMEN:  N/A  COUNTS:  YES  TOURNIQUET:  * No tourniquets in log *  DICTATION: .Other Dictation: Dictation Number [id number]  PLAN OF CARE: Admit to inpatient   PATIENT DISPOSITION:  PACU - hemodynamically stable.   Delay start of Pharmacological VTE agent (>24hrs) due to surgical blood loss or risk of bleeding: no

## 2015-05-08 NOTE — Transfer of Care (Signed)
Immediate Anesthesia Transfer of Care Note  Patient: [name]  Procedure(s) Performed: Procedure(s): INTERTROCHANTERIC FEMORAL NAIL (Left)  Patient Location: PACU  Anesthesia Type:General  Level of Consciousness:  sedated, patient cooperative and responds to stimulation  Airway & Oxygen Therapy:Patient Spontanous Breathing and Patient connected to face mask oxgen  Post-op Assessment:  Report given to PACU RN and Post -op Vital signs reviewed and stable  Post vital signs:  Reviewed and stable  Last Vitals:  Filed Vitals:   05/08/15 1658 05/08/15 1757  BP: 170/69 166/69  Pulse: 96 84  Temp:  37.2 C  Resp: 16 16    Complications: No apparent anesthesia complications

## 2015-05-08 NOTE — Anesthesia Procedure Notes (Signed)
Procedure Name: Intubation Date/Time: 05/08/2015 6:45 PM Performed by: Gari Hartsell, Virgel Gess Pre-anesthesia Checklist: Patient identified, Emergency Drugs available, Suction available, Patient being monitored and Timeout performed Patient Re-evaluated:Patient Re-evaluated prior to inductionOxygen Delivery Method: Circle system utilized Preoxygenation: Pre-oxygenation with 100% oxygen Intubation Type: IV induction Ventilation: Mask ventilation without difficulty Laryngoscope Size: [name] and 3 Grade View: Grade II Tube type: Oral Tube size: 7.0 mm Number of attempts: 1 Airway Equipment and Method: Stylet Placement Confirmation: ETT inserted through vocal cords under direct vision,  positive ETCO2,  CO2 detector and breath sounds checked- equal and bilateral Secured at: 20 cm Tube secured with: Tape Dental Injury: Teeth and Oropharynx as per pre-operative assessment

## 2015-05-08 NOTE — Consult Note (Signed)
ORTHOPAEDIC CONSULTATION  REQUESTING PHYSICIAN: Cristal Ford, DO  PCP:  [name], MD  Chief Complaint: left hip pain  HPI: [name] is a 82 y.o. female who complains of left hip pain.  Leg gave out and she fell today. Ortho consult for Left hip fracture. Lives alone. Denies other injuries.  Past Medical History  Diagnosis Date  . MVP (mitral valve prolapse)   . Hypertension   . Chest tightness   . Palpitation   . PVC's (premature ventricular contractions)   . Breast cancer ([place])     right  . Polycythemia   . Chronic gastritis   . Diverticulosis   . Candida esophagitis ([place])   . Arthritis    Past Surgical History  Procedure Laterality Date  . Tonsillectomy    . Appendectomy    . Cardiovascular stress test  12/23/2000    EF 70%  . Transthoracic echocardiogram  03/05/2010    EF 55-60%  . Abdominal hysterectomy      ovaries taken  . Breast lumpectomy      right  . Elbow fracture surgery      left  . Cataract extraction      bilateral  . Hematoma evacuation      right   Social History   Social History  . Marital Status: Widowed    Spouse Name: N/A  . Number of Children: 2  . Years of Education: N/A   Occupational History  . retired    Social History Main Topics  . Smoking status: Former Smoker    Quit date: 08/24/1980  . Smokeless tobacco: Never Used  . Alcohol Use: Yes     Comment: occasional  . Drug Use: No  . Sexual Activity: Not Asked   Other Topics Concern  . None   Social History Narrative   Family History  Problem Relation Age of Onset  . Stomach cancer Mother   . Hypertension Father   . Stroke Father   . Hypertension Sister   . Hypertension Brother   . Prostate cancer Brother   . Colon cancer Neg Hx   . Prostate cancer Father   . Diabetes Brother    Allergies  Allergen Reactions  . Sulfur Other (See Comments)    Pt doesn't remember reaction.   Prior to Admission medications   Medication Sig Start Date  End Date Taking? Authorizing Provider  amLODipine (NORVASC) 2.5 MG tablet Take one-half tablet by mouth daily 01/21/15  Yes Thayer Headings, MD  anagrelide (AGRYLIN) 0.5 MG capsule TAKE 2 CAPSULES IN THE MORNING AND 1 CAPSULE IN THE AFTERNOON. 04/14/15  Yes Truitt Merle, MD  aspirin 81 MG tablet Take 81 mg by mouth daily.     Yes Historical Provider, MD  Calcium Carbonate-Vitamin D (CALCIUM 500/D PO) Take 1 capsule by mouth daily.   Yes Historical Provider, MD  Cetirizine HCl (ZYRTEC PO) Take 1 tablet by mouth as needed (AS NEEDED FOR ALLERGIES).    Yes Historical Provider, MD  Cholecalciferol (VITAMIN D3) 1000 UNITS CAPS Take 2,000 mg by mouth 2 (two) times daily.    Yes Historical Provider, MD  dicyclomine (BENTYL) 10 MG capsule Take 1 capsule (10 mg total) by mouth 4 (four) times daily as needed for spasms. 02/19/14  Yes Lafayette Dragon, MD  fluticasone [facility]) 50 MCG/ACT nasal spray Place 2 sprays into the nose as needed for allergies or rhinitis.    Yes Historical Provider, MD  hydrochlorothiazide (MICROZIDE) 12.5 MG capsule  Take 1 capsule (12.5 mg total) by mouth daily. 01/21/15  Yes Thayer Headings, MD  lisinopril (PRINIVIL,ZESTRIL) 20 MG tablet TAKE 1 TABLET EACH DAY. 03/04/15  Yes Thayer Headings, MD  loperamide (IMODIUM) 1 MG/5ML solution Take 5 mLs (1 mg total) by mouth daily. 05/16/13  Yes Lafayette Dragon, MD  metoprolol succinate (TOPROL-XL) 50 MG 24 hr tablet Take 1 tablet (50 mg total) by mouth daily. 12/03/14  Yes Thayer Headings, MD  Multiple Vitamin (MULTIVITAMIN) tablet Take 1 tablet by mouth daily.     Yes Historical Provider, MD  pantoprazole (PROTONIX) 40 MG tablet Take 40 mg by mouth as needed (AS NEEDED FOR STOMACH).    Yes Historical Provider, MD  potassium chloride (K-DUR) 10 MEQ tablet Take 1 tablet (10 mEq total) by mouth daily. 02/18/15  Yes Thayer Headings, MD  Probiotic Product ([place] PO) Take 1 tablet by mouth daily.   Yes Historical Provider, MD  colestipol  (COLESTID) 1 G tablet Take 1 tablet (1 g total) by mouth daily. Patient not taking: Reported on 05/08/2015 08/02/13   Lafayette Dragon, MD  cyclobenzaprine (FLEXERIL) 5 MG tablet Take 1 tablet (5 mg total) by mouth 3 (three) times daily as needed for muscle spasms. Patient not taking: Reported on 05/08/2015 04/22/14   Truitt Merle, MD   Dg Chest 1 View  05/08/2015  CLINICAL DATA:  Preop for left hip fracture. EXAM: CHEST 1 VIEW COMPARISON:  12/31/2005 FINDINGS: Normal heart size and mediastinal contours. No acute infiltrate or edema. No effusion or pneumothorax. L1 superior endplate fracture which is chronic based on 2016 lumbar spine MRI. Remote lateral right seventh rib fracture. No acute osseous findings. IMPRESSION: No evidence of active disease. Electronically Signed   By: Monte Fantasia M.D.   On: 05/08/2015 09:41   Dg Knee Left Port  05/08/2015  CLINICAL DATA:  Intertrochanteric left femur fracture. Initial encounter. EXAM: PORTABLE LEFT KNEE - 1-2 VIEW COMPARISON:  None. FINDINGS: Limited patient positioning due to ipsilateral hip fracture. No evidence of fracture or subluxation at the knee. No visualized joint effusion but limited by obliquity. Osteopenia. IMPRESSION: Limited study with no evidence of fracture at the knee. Electronically Signed   By: Monte Fantasia M.D.   On: 05/08/2015 12:40   Dg Hip Unilat With Pelvis 2-3 Views Left  05/08/2015  CLINICAL DATA:  Fall with left hip pain.  Initial encounter. EXAM: DG HIP (WITH OR WITHOUT PELVIS) 2-3V LEFT COMPARISON:  None. FINDINGS: Acute intertrochanteric left femur fracture with varus angulation. Remote left obturator ring fracture with healed distortion. No evidence of acute pelvic fracture. Located hips. IMPRESSION: 1. Angulated intertrochanteric left femur fracture. 2. Remote left obturator ring fractures. Electronically Signed   By: Monte Fantasia M.D.   On: 05/08/2015 09:35    Positive ROS: All other systems have been reviewed and were otherwise  negative with the exception of those mentioned in the HPI and as above.  Physical Exam: General: Alert, no acute distress Cardiovascular: No pedal edema Respiratory: No cyanosis, no use of accessory musculature GI: No organomegaly, abdomen is soft and non-tender Skin: No lesions in the area of chief complaint Neurologic: Sensation intact distally Psychiatric: Patient is competent for consent with normal mood and affect Lymphatic: No axillary or cervical lymphadenopathy  MUSCULOSKELETAL: LLE shortened and externally rotated. Pain with logroll. Skin intact. + TA/GS/EHL. 2+ DP. SILT.  Assessment: Displaced L reverse obliquity IT femur fx  Plan: Admit to hospitalist bedrest NPO  Hold chemical DVT ppx for now To OR for IM nail LEFT femur later tonight Discussed R/B/A with patient and her daughter Will likely need SNF postop   The risks, benefits, and alternatives were discussed with the patient. There are risks associated with the surgery including, but not limited to, problems with anesthesia (death), infection, differences in leg length/angulation/rotation, fracture of bones, loosening or failure of implants, malunion, nonunion, hematoma (blood accumulation) which may require surgical drainage, blood clots, pulmonary embolism, nerve injury (foot drop), and blood vessel injury. This particular injury carries a one year mortality of 30-50%. The patient understands these risks and elects to proceed.   Daria Mcmeekin, Horald Pollen, MD Cell [phone]    05/08/2015 1:05 PM

## 2015-05-08 NOTE — ED Notes (Addendum)
13:01 called the floor and charge wanted to check with the [facility] to make sure pt coming to that floor. 13:21

## 2015-05-09 ENCOUNTER — Encounter (HOSPITAL_COMMUNITY): Payer: Self-pay | Admitting: Orthopedic Surgery

## 2015-05-09 DIAGNOSIS — I341 Nonrheumatic mitral (valve) prolapse: Secondary | ICD-10-CM

## 2015-05-09 DIAGNOSIS — I10 Essential (primary) hypertension: Secondary | ICD-10-CM

## 2015-05-09 DIAGNOSIS — R55 Syncope and collapse: Secondary | ICD-10-CM | POA: Insufficient documentation

## 2015-05-09 LAB — BASIC METABOLIC PANEL
ANION GAP: 8 (ref 5–15)
BUN: 22 mg/dL — ABNORMAL HIGH (ref 6–20)
CHLORIDE: 101 mmol/L (ref 101–111)
CO2: 26 mmol/L (ref 22–32)
Calcium: 8.5 mg/dL — ABNORMAL LOW (ref 8.9–10.3)
Creatinine, Ser: 1.26 mg/dL — ABNORMAL HIGH (ref 0.44–1.00)
GFR calc Af Amer: 43 mL/min — ABNORMAL LOW (ref >60)
GFR, EST NON AFRICAN AMERICAN: 37 mL/min — AB (ref >60)
Glucose, Bld: 165 mg/dL — ABNORMAL HIGH (ref 65–99)
POTASSIUM: 4.5 mmol/L (ref 3.5–5.1)
SODIUM: 135 mmol/L (ref 135–145)

## 2015-05-09 LAB — CBC
HEMATOCRIT: 34.2 % — AB (ref 36.0–46.0)
HEMOGLOBIN: 10.4 g/dL — AB (ref 12.0–15.0)
MCH: 20.4 pg — AB (ref 26.0–34.0)
MCHC: 30.4 g/dL (ref 30.0–36.0)
MCV: 66.9 fL — ABNORMAL LOW (ref 78.0–100.0)
Platelets: 481 10*3/uL — ABNORMAL HIGH (ref 150–400)
RBC: 5.11 MIL/uL (ref 3.87–5.11)
RDW: 18.1 % — ABNORMAL HIGH (ref 11.5–15.5)
WBC: 27.1 10*3/uL — ABNORMAL HIGH (ref 4.0–10.5)

## 2015-05-09 MED ORDER — ANAGRELIDE HCL 0.5 MG PO CAPS
1.0000 mg | ORAL_CAPSULE | Freq: Every day | ORAL | Status: DC
  Administered 2015-05-09 – 2015-05-13 (×5): 1 mg via ORAL
  Filled 2015-05-09 (×5): qty 2

## 2015-05-09 MED ORDER — ANAGRELIDE HCL 0.5 MG PO CAPS
0.5000 mg | ORAL_CAPSULE | Freq: Every day | ORAL | Status: DC
  Administered 2015-05-09 – 2015-05-11 (×3): 0.5 mg via ORAL
  Filled 2015-05-09 (×6): qty 1

## 2015-05-09 MED ORDER — SODIUM CHLORIDE 0.9 % IV BOLUS (SEPSIS)
250.0000 mL | Freq: Once | INTRAVENOUS | Status: AC
  Administered 2015-05-09: 250 mL via INTRAVENOUS

## 2015-05-09 MED ORDER — ENSURE ENLIVE PO LIQD
237.0000 mL | Freq: Two times a day (BID) | ORAL | Status: DC
  Administered 2015-05-09 – 2015-05-12 (×4): 237 mL via ORAL

## 2015-05-09 NOTE — Progress Notes (Signed)
Initial Nutrition Assessment  DOCUMENTATION CODES:   Non-severe (moderate) malnutrition in context of chronic illness  INTERVENTION:  -Ensure Enlive po BID, each supplement provides 350 kcal and 20 grams of protein -RD continue to monitor   NUTRITION DIAGNOSIS:   Malnutrition related to chronic illness as evidenced by moderate depletion of body fat, moderate depletions of muscle mass.  GOAL:   Patient will meet greater than or equal to 90% of their needs  MONITOR:   PO intake, Labs, I & O's, Supplement acceptance  REASON FOR ASSESSMENT:   Consult Assessment of nutrition requirement/status  ASSESSMENT:   [name] is an 82 yo female With a history of hypertension, mitral valve prolapse, polycythemia, that presented to the emergency department after sustaining a fall and having left leg pain. Patient states her leg simply "gave out" after going down one stair.  [name] is an pleasant 82 yo woman who endorses ok appetite, denies recent weight loss. She states "I've never been a big eater, but I do eat."   Pt states that she normally does oatmeal with honey or a bagel with an egg and bacon for breakfast followed by a boost or ensure for lunch and a salad with protein for dinner. It appears patient is likely close to meeting estimated needs.  Her weight, per chart is down form 99#-95# over the course of 1.5 years for a 4#/4% insignificant weight loss.  Nutrition-Focused physical exam completed. Findings are moderate fat depletion, moderate muscle depletion, and no edema.   Pt is relatively thin, and could probably do to put on 10# but seems to be doing ok nutrition-wise.  Labs and Medications reviewed.  Diet Order:  Diet regular Room service appropriate?: Yes; Fluid consistency:: Thin  Skin:  Wound (see comment) (Incision @ L Thigh)  Last BM:  4/7  Height:   Ht Readings from Last 1 Encounters:  05/08/15 5\' 3"  (1.6 m)    Weight:   Wt Readings from Last 1  Encounters:  05/08/15 95 lb 10.9 oz (43.4 kg)    Ideal Body Weight:  52.27 kg  BMI:  Body mass index is 16.95 kg/(m^2).  Estimated Nutritional Needs:   Kcal:  1300-1500 calories  Protein:  40-50 grams  Fluid:  >/= 1.3L  EDUCATION NEEDS:   No education needs identified at this time  Satira Anis. Kelly Trent, MS, RD LDN After Hours/Weekend Pager [phone]

## 2015-05-09 NOTE — Clinical Social Work Note (Signed)
Clinical Social Work Assessment  Patient Details  Name: [name] MRN: [id number] Date of Birth: 04/30/1926  Date of referral:  05/09/15               Reason for consult:  Facility Placement                Permission sought to share information with:  Chartered certified accountant granted to share information::  Yes, Verbal Permission Granted  Name::        Agency::     Relationship::     Contact Information:     Housing/Transportation Living arrangements for the past 2 months:  Single Family Home Source of Information:  Patient, Adult Children Patient Interpreter Needed:  None Criminal Activity/Legal Involvement Pertinent to Current Situation/Hospitalization:  No - Comment as needed Significant Relationships:  Adult Children Lives with:  Self Do you feel safe going back to the place where you live?  No Need for family participation in patient care:  Yes (Comment)  Care giving concerns:  CSW received consult for SNF placement.    Social Worker assessment / plan:  CSW spoke with patient & son, [name] & daughter, [name] via phone - all are in agreement with plan for SNF at discharge.   Employment status:  Retired Forensic scientist:  Medicare PT Recommendations:  [place] / Referral to community resources:  [place]  Patient/Family's Response to care:  Patient's daughter is requesting [place] confirmed with [name] at [facility] that they would be able to take patient when ready.   Patient/Family's Understanding of and Emotional Response to Diagnosis, Current Treatment, and Prognosis:  Patient is relieved that surgery is over & is looking forward to rehab.   Emotional Assessment Appearance:  Appears stated age Attitude/Demeanor/Rapport:    Affect (typically observed):  Quiet, Pleasant, Calm Orientation:  Oriented to Self, Oriented to Place, Oriented to  Time, Oriented to Situation Alcohol / Substance  use:    Psych involvement (Current and /or in the community):     Discharge Needs  Concerns to be addressed:    Readmission within the last 30 days:    Current discharge risk:    Barriers to Discharge:      Standley Brooking, LCSW 05/09/2015, 12:35 PM

## 2015-05-09 NOTE — Progress Notes (Signed)
Physical Therapy Treatment Patient Details Name: [name] MRN: [id number] DOB: 1926/10/28 Today's Date: 05/09/2015    History of Present Illness Pt s/p fall with L intertrochanteric femur fx and IM nailing repair.      PT Comments    Pt continues very cooperative but ltd by onset dizziness with activity in standing.  RN aware.  Follow Up Recommendations  SNF     Equipment Recommendations  None recommended by PT    Recommendations for Other Services OT consult     Precautions / Restrictions Precautions Precautions: Fall Restrictions Weight Bearing Restrictions: Yes LLE Weight Bearing: Touchdown weight bearing LLE Partial Weight Bearing Percentage or Pounds: 30%    Mobility  Bed Mobility Overal bed mobility: Needs Assistance;+2 for physical assistance Bed Mobility: Sit to Supine       Sit to supine: +2 for physical assistance;Mod assist;Max assist   General bed mobility comments: cues for sequence and use of R LE to self assist.   Transfers Overall transfer level: Needs assistance Equipment used: Rolling walker (2 wheeled) Transfers: Sit to/from Stand Sit to Stand: Min assist;Mod assist;+2 physical assistance;+2 safety/equipment         General transfer comment: cues for LE management and use of UEs to self assist  Ambulation/Gait Ambulation/Gait assistance: Min assist;Mod assist;+2 physical assistance;+2 safety/equipment Ambulation Distance (Feet): 2 Feet Assistive device: Rolling walker (2 wheeled) Gait Pattern/deviations: Step-to pattern;Decreased step length - right;Decreased step length - left;Shuffle Gait velocity: decr   General Gait Details: cues for sequence, posture, position from RW and TDWB.  Distance ltd by onset dizziness/nausea   Financial trader Rankin (Stroke Patients Only)       Balance                                    Cognition Arousal/Alertness:  Awake/alert Behavior During Therapy: WFL for tasks assessed/performed Overall Cognitive Status: Within Functional Limits for tasks assessed                      Exercises General Exercises - Lower Extremity Ankle Circles/Pumps: AROM;Both;15 reps;Supine    General Comments        Pertinent Vitals/Pain Pain Assessment: 0-10 Pain Score: 8  (with activity) Pain Location: L hip Pain Descriptors / Indicators: Aching;Sore Pain Intervention(s): Limited activity within patient's tolerance;Monitored during session;Ice applied    Home Living                      Prior Function            PT Goals (current goals can now be found in the care plan section) Acute Rehab PT Goals Patient Stated Goal: Regain IND to return home and live alone PT Goal Formulation: With patient Time For Goal Achievement: 05/17/15 Potential to Achieve Goals: Good    Frequency  Min 3X/week    PT Plan Current plan remains appropriate    Co-evaluation             End of Session Equipment Utilized During Treatment: Gait belt Activity Tolerance: Patient limited by fatigue;Patient limited by pain;Other (comment) (Dizziness with OOB activity) Patient left: in bed;with call bell/phone within reach     Time: 1355-1407 PT Time Calculation (min) (ACUTE ONLY): 12 min  Charges:  $Therapeutic Activity: 8-22 mins  G Codes:      Kealie Barrie 22-May-2015, 6:01 PM

## 2015-05-09 NOTE — Clinical Social Work Placement (Signed)
CSW confirmed with [name] at [facility] that they would be able to take patient over the weekend if ready.   Please call weekend CSW (ph#: [phone]) to facilitate discharge.      Raynaldo Opitz, [facility] Clinical Social Worker cell #: [phone]      CLINICAL SOCIAL WORK PLACEMENT  NOTE  Date:  05/09/2015  Patient Details  Name: [name] MRN: [id number] Date of Birth: Mar 01, 1926  Clinical Social Work is seeking post-discharge placement for this patient at the [place] level of care (*CSW will initial, date and re-position this form in  chart as items are completed):  Yes   Patient/family provided with [facility] Work Department's list of facilities offering this level of care within the geographic area requested by the patient (or if unable, by the patient's family).  Yes   Patient/family informed of their freedom to choose among providers that offer the needed level of care, that participate in Medicare, Medicaid or managed care program needed by the patient, have an available bed and are willing to accept the patient.  Yes   Patient/family informed of [facility]'s ownership interest in [facility] and [facility], as well as of the fact that they are under no obligation to receive care at these facilities.  PASRR submitted to EDS on 05/09/15     PASRR number received on 05/09/15     Existing PASRR number confirmed on       FL2 transmitted to all facilities in geographic area requested by pt/family on 05/09/15     FL2 transmitted to all facilities within larger geographic area on       Patient informed that his/her managed care company has contracts with or will negotiate with certain facilities, including the following:        Yes   Patient/family informed of bed offers received.  Patient [facility] bed at [facility]     Physician recommends and patient chooses bed at      Patient to be  transferred to [facility] on  .  Patient to be transferred to facility by       Patient family notified on   of transfer.  Name of family member notified:        PHYSICIAN       Additional Comment:    _______________________________________________ Standley Brooking, LCSW 05/09/2015, 12:38 PM

## 2015-05-09 NOTE — Op Note (Signed)
[name], [name]                ACCOUNT NO.:  [id number]  MEDICAL RECORD NO.:  [id number]  LOCATION:  [id number]                         FACILITY:  [facility]  PHYSICIAN:  Rod Can, MD     DATE OF BIRTH:  Oct 20, 1926  DATE OF PROCEDURE:  05/08/2015 DATE OF DISCHARGE:                              OPERATIVE REPORT   PREOPERATIVE DIAGNOSIS:  Left pertrochanteric femur fracture.  POSTOPERATIVE DIAGNOSIS:  Left pertrochanteric femur fracture.  PROCEDURE PERFORMED:  Intramedullary fixation of left pertrochanteric femur fracture.  IMPLANTS: 1. Synthes TFNA nail, size 10 x 380 mm, 130 degrees. 2. TFNA helical blade, size 90 mm. 3. 5.0 x 38-mm distal interlocking screw x1.  SURGEON:  Rod Can, MD  ASSISTANT:  Abbott Pao. Dixon, PA-C.  ANESTHESIA:  General.  EBL:  150 mL.  COMPLICATIONS:  None.  TUBES AND DRAINS:  None.  DISPOSITION:  Stable to PACU.  INDICATIONS:  The patient is an 82 year old female, tripped, had a ground-level fall at home earlier today.  She had hip pain and inability to weight bear.  She was brought to the emergency department, x-rays revealed the above-mentioned injury.  Risks, benefits, and alternatives to operative fixation were explained, the patient elected to proceed. She did undergo perioperative risk stratification and medical optimization by the Hospitalist.  PROCEDURE IN DETAIL:  I identified the patient in the holding area using two identifiers.  I marked the surgical site.  She was taken to the operating room, general anesthesia was induced on her bed.  She was transferred to the [facility] table.  The right lower extremity was scissored underneath the left.  I reduced the fracture with standard traction, internal rotation and adduction.  AP, lateral fluoroscopy views were used to confirm near anatomic reduction.  She did have a comminuted pertrochanteric femur fracture.  The main fracture line was intertrochanteric line.  The anterior aspect of  the trochanter was fractured as well.  The left hip was prepped and draped in the normal sterile surgical fashion.  She did receive 2 g of IV Ancef within 60 minutes at the beginning of the procedure.  I began by using fluoroscopy to define her anatomy.  I made a 4-cm incision proximal to the tip of the trochanter.  I used a starting awl to determine the standard starting point for the trochanteric entry nail.  I placed the guidepin under live AP and lateral fluoroscopic views.  I then used the entry reamer.  I then passed the guidewire down to the physeal scar of the knee.  I measured the length of the nail.  I reamed up to 11 x 5-mm with excellent chatter; therefore, 10 x 380 was selected and passed.  Nail was seated to the appropriate depth.  I made a stab incision, and then I inserted the cannula through the jig down to the bone for the cephalomedullary device.  I passed the guidepin under AP and lateral fluoroscopy views.  I then measured, reamed and placed the real TFNA blade.  The set screw was tightened and then loosened one-quarter turn. I removed the jig.  Using perfect circle technique, I placed one distal interlocking screw.  Final AP and  lateral fluoroscopy views were obtained.  Reduction was near anatomic.  The tip apex distance was appropriate.  There was no chondral penetration.  Wounds were copiously irrigated with saline.  The wounds were closed in layers with #1 Vicryl for the fascia, 2-0 Monocryl for the deep dermal layer, running 3-0 Monocryl subcuticular stitch.  Glue was applied followed by sterile dressings.  The patient was then extubated, taken to the PACU in stable condition.  Sponge, needle, and instrument counts were correct at the end of the case x2.  There were no known complications.  We will readmit the patient to the Hospitalist Service.  She may be touchdown weightbearing to the left lower extremity.  She will need physical therapy and disposition  planning.  We will put her on Lovenox for DVT prophylaxis.  I will see her in the office 2 weeks after discharge.          ______________________________ Rod Can, MD     BS/MEDQ  D:  05/08/2015  T:  05/09/2015  Job:  [id number]

## 2015-05-09 NOTE — NC FL2 (Signed)
[place] LEVEL OF CARE SCREENING TOOL     IDENTIFICATION  Patient Name: [name] Birthdate: [birth year] Sex: female Admission Date (Current Location): 05/08/2015  [facility] and [place] Number:  Herbalist and Address:  [facility] [place],  [place] [address] [place]      Provider Number: [id number]  Attending Physician Name and Address:  Cristal Ford, DO  Relative Name and Phone Number:       Current Level of Care: Hospital Recommended Level of Care: [place] Prior Approval Number:    Date Approved/Denied:   PASRR Number: [id number]  Discharge Plan: SNF    Current Diagnoses: Patient Active Problem List   Diagnosis Date Noted  . Closed left hip fracture ([place]) 05/08/2015  . Leukocytosis 05/08/2015  . Fracture, intertrochanteric, left femur ([place]) 05/08/2015  . HTN (hypertension) 08/27/2010  . MVP (mitral valve prolapse) 08/27/2010  . Malignant neoplasm of female breast ([place]) 06/20/2007  . Polycythemia vera ([place]) 06/20/2007  . ABDOMINAL PAIN, EPIGASTRIC 06/20/2007  . GASTRITIS, CHRONIC 06/19/2007  . DIVERTICULOSIS OF COLON 06/19/2007  . ARTHRITIS 06/19/2007    Orientation RESPIRATION BLADDER Height & Weight     Self, Time, Situation, Place  Normal Continent Weight: 95 lb 10.9 oz (43.4 kg) Height:  5\' 3"  (160 cm)  BEHAVIORAL SYMPTOMS/MOOD NEUROLOGICAL BOWEL NUTRITION STATUS      Continent Diet (Regular)  AMBULATORY STATUS COMMUNICATION OF NEEDS Skin   Extensive Assist Verbally Surgical wounds (Incision(Closed)04/06/17ThighLeft)                       Personal Care Assistance Level of Assistance  Bathing, Dressing Bathing Assistance: Limited assistance   Dressing Assistance: Limited assistance     Functional Limitations Info             SPECIAL CARE FACTORS FREQUENCY  PT (By licensed PT), OT (By licensed OT)     PT Frequency: 5 OT Frequency: 5            Contractures       Additional Factors Info                  Current Medications (05/09/2015):  This is the current hospital active medication list Current Facility-Administered Medications  Medication Dose Route Frequency Provider Last Rate Last Dose  . 0.9 %  sodium chloride infusion   Intravenous Continuous Maryann Mikhail, DO 75 mL/hr at 05/08/15 2111    . acetaminophen (TYLENOL) tablet 650 mg  650 mg Oral Q6H PRN Rod Can, MD   650 mg at 05/09/15 1020   Or  . acetaminophen (TYLENOL) suppository 650 mg  650 mg Rectal Q6H PRN Rod Can, MD      . amLODipine (NORVASC) tablet 1.25 mg  1.25 mg Oral Daily Maryann Mikhail, DO   Stopped at 05/09/15 [phone]  . anagrelide (AGRYLIN) capsule 0.5 mg  0.5 mg Oral QAC supper Maryann Mikhail, DO      . anagrelide (AGRYLIN) capsule 1 mg  1 mg Oral Daily Maryann Mikhail, DO      . calcium-vitamin D (OSCAL WITH D) 500-200 MG-UNIT per tablet 1 tablet  1 tablet Oral Daily Maryann Mikhail, DO   1 tablet at 05/09/15 0939  . dicyclomine (BENTYL) capsule 10 mg  10 mg Oral QID PRN Maryann Mikhail, DO      . enoxaparin (LOVENOX) injection 30 mg  30 mg Subcutaneous Q24H Rod Can, MD   30 mg at 05/09/15 0857  .  fluticasone (FLONASE) 50 MCG/ACT nasal spray 2 spray  2 spray Each Nare PRN Maryann Mikhail, DO      . hydrochlorothiazide (MICROZIDE) capsule 12.5 mg  12.5 mg Oral Daily Maryann Mikhail, DO   Stopped at 05/09/15 0946  . HYDROcodone-acetaminophen (NORCO/VICODIN) 5-325 MG per tablet 1-2 tablet  1-2 tablet Oral Q6H PRN Rod Can, MD   1 tablet at 05/08/15 2255  . lisinopril (PRINIVIL,ZESTRIL) tablet 20 mg  20 mg Oral Daily Maryann Mikhail, DO   Stopped at 05/09/15 [phone]  . menthol-cetylpyridinium (CEPACOL) lozenge 3 mg  1 lozenge Oral PRN Rod Can, MD       Or  . phenol (CHLORASEPTIC) mouth spray 1 spray  1 spray Mouth/Throat PRN Rod Can, MD      . metoCLOPramide (REGLAN) tablet 5-10 mg  5-10 mg Oral Q8H PRN Rod Can, MD       Or  .  metoCLOPramide (REGLAN) injection 5-10 mg  5-10 mg Intravenous Q8H PRN Rod Can, MD      . metoprolol succinate (TOPROL-XL) 24 hr tablet 50 mg  50 mg Oral Daily Maryann Mikhail, DO   Stopped at 05/09/15 [id number]  . morphine 2 MG/ML injection 0.5 mg  0.5 mg Intravenous Q2H PRN Rod Can, MD      . multivitamin with minerals tablet 1 tablet  1 tablet Oral Daily Maryann Mikhail, DO   1 tablet at 05/09/15 0939  . ondansetron (ZOFRAN) injection 4 mg  4 mg Intravenous Q6H PRN Maryann Mikhail, DO      . pantoprazole (PROTONIX) EC tablet 40 mg  40 mg Oral Daily PRN Maryann Mikhail, DO      . potassium chloride (K-DUR) CR tablet 10 mEq  10 mEq Oral Daily Maryann Mikhail, DO   10 mEq at 05/09/15 [id number]     Discharge Medications: Please see discharge summary for a list of discharge medications.  Relevant Imaging Results:  Relevant Lab Results:   Additional Information SSN: [id number]  Standley Brooking, LCSW

## 2015-05-09 NOTE — Evaluation (Signed)
Physical Therapy Evaluation Patient Details Name: [name] MRN: [id number] DOB: 14-Jun-1926 Today's Date: 05/09/2015   History of Present Illness  Pt s/p fall with L intertrochanteric femur fx and IM nailing repair.    Clinical Impression  Pt admitted as above and presenting with functional mobility limitations 2* decreased L LE strength/ROM, post op pain, TDWB on L LE and dizziness/nausea with OOB activity.  Pt would benefit from follow up rehab at SNF level to maximize IND and safety prior to return home with ltd assist.  This session, pt c/o dizziness/nausea with attempts to ambulate.  Pt moved to chair and became unresponsive for several seconds.  RN alerted.  BP 103/48 with HR 78 BPM and SaO2 97%.    Follow Up Recommendations SNF    Equipment Recommendations  None recommended by PT    Recommendations for Other Services OT consult     Precautions / Restrictions Precautions Precautions: Fall Restrictions Weight Bearing Restrictions: Yes LLE Weight Bearing: Touchdown weight bearing LLE Partial Weight Bearing Percentage or Pounds: 30%      Mobility  Bed Mobility Overal bed mobility: Needs Assistance;+2 for physical assistance Bed Mobility: Supine to Sit     Supine to sit: Min assist;Mod assist;+2 for physical assistance     General bed mobility comments: cues for sequence and use of R LE to self assist.  Pt assisted to EOB with pad  Transfers Overall transfer level: Needs assistance Equipment used: Rolling walker (2 wheeled) Transfers: Sit to/from Stand Sit to Stand: Min assist;Mod assist;+2 physical assistance;+2 safety/equipment         General transfer comment: cues for LE management and use of UEs to self assist  Ambulation/Gait Ambulation/Gait assistance: Min assist;Mod assist;+2 physical assistance;+2 safety/equipment Ambulation Distance (Feet): 3 Feet Assistive device: Rolling walker (2 wheeled) Gait Pattern/deviations: Step-to pattern;Decreased step  length - right;Decreased step length - left;Shuffle;Trunk flexed Gait velocity: decr   General Gait Details: cues for sequence, posture, position from RW and TDWB.  Distance ltd by onset dizziness/nausea  Financial trader Rankin (Stroke Patients Only)       Balance                                             Pertinent Vitals/Pain Pain Assessment: 0-10 Pain Score: 8  (8 with activity; 2/10 at rest) Pain Location: L hip Pain Descriptors / Indicators: Aching;Sore Pain Intervention(s): Limited activity within patient's tolerance;Monitored during session;Premedicated before session;Ice applied (Premed with Tyelenol only 2* low BP)    Home Living Family/patient expects to be discharged to:: Skilled nursing facility                      Prior Function Level of Independence: Independent               Hand Dominance        Extremity/Trunk Assessment   Upper Extremity Assessment: Overall WFL for tasks assessed           Lower Extremity Assessment: LLE deficits/detail      Cervical / Trunk Assessment: Normal  Communication   Communication: No difficulties  Cognition Arousal/Alertness: Awake/alert Behavior During Therapy: WFL for tasks assessed/performed Overall Cognitive Status: Within Functional Limits for tasks assessed  General Comments      Exercises General Exercises - Lower Extremity Ankle Circles/Pumps: AROM;Both;15 reps;Supine      Assessment/Plan    PT Assessment Patient needs continued PT services  PT Diagnosis Difficulty walking   PT Problem List Decreased strength;Decreased activity tolerance;Decreased balance;Decreased range of motion;Decreased mobility;Decreased knowledge of use of DME;Pain  PT Treatment Interventions DME instruction;Gait training;Stair training;Functional mobility training;Therapeutic activities;Therapeutic  exercise;Patient/family education   PT Goals (Current goals can be found in the Care Plan section) Acute Rehab PT Goals Patient Stated Goal: Regain IND to return home and live alone PT Goal Formulation: With patient Time For Goal Achievement: 05/17/15 Potential to Achieve Goals: Good    Frequency Min 3X/week   Barriers to discharge        Co-evaluation PT/OT/SLP Co-Evaluation/Treatment: Yes Reason for Co-Treatment: For patient/therapist safety PT goals addressed during session: Mobility/safety with mobility OT goals addressed during session: ADL's and self-care       End of Session Equipment Utilized During Treatment: Gait belt Activity Tolerance: Other (comment);Patient limited by fatigue;Patient limited by pain (Dizziness with OOB activity) Patient left: in chair;with call bell/phone within reach;with chair alarm set;with family/visitor present Nurse Communication: Mobility status         Time: [id number] PT Time Calculation (min) (ACUTE ONLY): 23 min   Charges:   PT Evaluation $PT Eval Low Complexity: 1 Procedure     PT G Codes:        Sheyenne Konz Jun 03, 2015, 12:34 PM

## 2015-05-09 NOTE — Progress Notes (Addendum)
CARDIOLOGY CONSULT NOTE      Patient ID: [name] MRN: [id number] DOB/AGE: 80-Aug-1928 80 y.o.  Admit date: 05/08/2015 Referring PhysicianMaryann Mikhail, DO Primary Ladona Ridgel, MD Primary Cardiologist Dr. Acie Fredrickson Reason for Consultation syncope   HPI: 82 y/o who had a fall and hip fracture after tripping while going down the stairs.  SHe had surgery yesterday.  SHe was getting up to walk with PT today and then felt like she was going to Pass out. She sat back down and did not injure herself. She was told by the nurse that she was out for about 3 seconds.  Later in the day, she was trying to get up to sit in a chair. She had a similar feeling on the nursing reports that she was out for approximately 3 seconds. She does not have much appetite since her surgery. She does feel that her appetite is improving.  Review of systems complete and found to be negative unless listed above   Past Medical History  Diagnosis Date  . MVP (mitral valve prolapse)   . Hypertension   . Chest tightness   . Palpitation   . PVC's (premature ventricular contractions)   . Breast cancer ([place])     right  . Polycythemia   . Chronic gastritis   . Diverticulosis   . Candida esophagitis ([place])   . Arthritis     Family History  Problem Relation Age of Onset  . Stomach cancer Mother   . Hypertension Father   . Stroke Father   . Hypertension Sister   . Hypertension Brother   . Prostate cancer Brother   . Colon cancer Neg Hx   . Prostate cancer Father   . Diabetes Brother     Social History   Social History  . Marital Status: Widowed    Spouse Name: N/A  . Number of Children: 2  . Years of Education: N/A   Occupational History  . retired    Social History Main Topics  . Smoking status: Former Smoker    Quit date: 08/24/1980  . Smokeless tobacco: Never Used  . Alcohol Use: Yes     Comment: occasional  . Drug Use: No  . Sexual Activity: Not on file   Other Topics  Concern  . Not on file   Social History Narrative    Past Surgical History  Procedure Laterality Date  . Tonsillectomy    . Appendectomy    . Cardiovascular stress test  12/23/2000    EF 70%  . Transthoracic echocardiogram  03/05/2010    EF 55-60%  . Abdominal hysterectomy      ovaries taken  . Breast lumpectomy      right  . Elbow fracture surgery      left  . Cataract extraction      bilateral  . Hematoma evacuation      right  . Femur im nail Left 05/08/2015    Procedure: INTERTROCHANTERIC FEMORAL NAIL;  Surgeon: Rod Can, MD;  Location: [facility] ORS;  Service: Orthopedics;  Laterality: Left;     Prescriptions prior to admission  Medication Sig Dispense Refill Last Dose  . amLODipine (NORVASC) 2.5 MG tablet Take one-half tablet by mouth daily 15 tablet 11 05/07/2015 at Unknown time  . anagrelide (AGRYLIN) 0.5 MG capsule TAKE 2 CAPSULES IN THE MORNING AND 1 CAPSULE IN THE AFTERNOON. 90 capsule 0 05/07/2015 at Unknown time  . aspirin 81 MG tablet Take 81 mg by mouth daily.  05/07/2015 at Unknown time  . Calcium Carbonate-Vitamin D (CALCIUM 500/D PO) Take 1 capsule by mouth daily.   05/07/2015 at Unknown time  . Cetirizine HCl (ZYRTEC PO) Take 1 tablet by mouth as needed (AS NEEDED FOR ALLERGIES).    05/07/2015 at Unknown time  . Cholecalciferol (VITAMIN D3) 1000 UNITS CAPS Take 2,000 mg by mouth 2 (two) times daily.    05/07/2015 at Unknown time  . dicyclomine (BENTYL) 10 MG capsule Take 1 capsule (10 mg total) by mouth 4 (four) times daily as needed for spasms. 120 capsule 1 05/07/2015 at Unknown time  . fluticasone (FLONASE) 50 MCG/ACT nasal spray Place 2 sprays into the nose as needed for allergies or rhinitis.    05/07/2015 at Unknown time  . hydrochlorothiazide (MICROZIDE) 12.5 MG capsule Take 1 capsule (12.5 mg total) by mouth daily. 30 capsule 11 05/07/2015 at Unknown time  . lisinopril (PRINIVIL,ZESTRIL) 20 MG tablet TAKE 1 TABLET EACH DAY. 30 tablet 11 05/07/2015 at Unknown time  .  loperamide (IMODIUM) 1 MG/5ML solution Take 5 mLs (1 mg total) by mouth daily. 150 mL 0 Past Week at Unknown time  . metoprolol succinate (TOPROL-XL) 50 MG 24 hr tablet Take 1 tablet (50 mg total) by mouth daily. 90 tablet 2 05/07/2015 at 9am  . Multiple Vitamin (MULTIVITAMIN) tablet Take 1 tablet by mouth daily.     05/07/2015 at Unknown time  . pantoprazole (PROTONIX) 40 MG tablet Take 40 mg by mouth as needed (AS NEEDED FOR STOMACH).    05/07/2015 at Unknown time  . potassium chloride (K-DUR) 10 MEQ tablet Take 1 tablet (10 mEq total) by mouth daily. 30 tablet 11 05/07/2015 at Unknown time  . Probiotic Product (ULTRAFLORA IMMUNE HEALTH PO) Take 1 tablet by mouth daily.   05/07/2015 at Unknown time  . colestipol (COLESTID) 1 G tablet Take 1 tablet (1 g total) by mouth daily. (Patient not taking: Reported on 05/08/2015) 30 tablet 5 Taking  . cyclobenzaprine (FLEXERIL) 5 MG tablet Take 1 tablet (5 mg total) by mouth 3 (three) times daily as needed for muscle spasms. (Patient not taking: Reported on 05/08/2015) 15 tablet 0 Taking    Physical Exam: Vitals:   Filed Vitals:   05/09/15 1130 05/09/15 1300 05/09/15 1428 05/09/15 1820  BP: 104/48 110/46 108/44 132/47  Pulse: 78 89 94 116  Temp:  97.5 F (36.4 C) 97.8 F (36.6 C) 97.9 F (36.6 C)  TempSrc:  Oral Oral Oral  Resp:  18 18 18   Height:      Weight:      SpO2: 97% 94% 96% 95%   I&O's:   Intake/Output Summary (Last 24 hours) at 05/09/15 1835 Last data filed at 05/09/15 1756  Gross per 24 hour  Intake   1040 ml  Output    550 ml  Net    490 ml   Physical exam:  [place]/AT EOMI No JVD, No carotid bruit RRR [id number] 1/6 systolic murmur No wheezing Soft. NT, nondistended No edema. No focal motor or sensory deficits Normal affect Bruises on arms  Labs:   Lab Results  Component Value Date   WBC 27.1* 05/09/2015   HGB 10.4* 05/09/2015   HCT 34.2* 05/09/2015   MCV 66.9* 05/09/2015   PLT 481* 05/09/2015    Recent Labs Lab 05/09/15 0355    NA 135  K 4.5  CL 101  CO2 26  BUN 22*  CREATININE 1.26*  CALCIUM 8.5*  GLUCOSE 165*   No results found  for: CKTOTAL, CKMB, CKMBINDEX, TROPONINI No results found for: CHOL No results found for: HDL No results found for: LDLCALC No results found for: TRIG No results found for: CHOLHDL No results found for: LDLDIRECT    Radiology:No active disease noted on chest x-ray EKG: Normal sinus rhythm, no ST segment changes  ASSESSMENT AND PLAN:  Active Problems:   Polycythemia vera ([facility])   HTN (hypertension)   MVP (mitral valve prolapse)   Closed left hip fracture ([facility])   Leukocytosis   Fracture, intertrochanteric, left femur ([facility])  Syncope appears to be from orthostatic hypotension. This is likely caused by her lack of appetite postoperatively. Both episodes occurred while she went from a sitting to a standing position. I encouraged her to try to stay well-hydrated. I have spoken to the nursing staff about giving her  Gatorade other non- caffeinated beverages to help keep her hydrated.   MVP: No signs of severe MR or heart failure.  She'll also be placed on telemetry. Based on the history, I doubt a rhythm disturbance but will watch this for 24 hours on telemetry.   Signed:   Mina Marble, MD, [facility] 05/09/2015, 6:35 PM

## 2015-05-09 NOTE — Progress Notes (Signed)
Occupational Therapy Evaluation Patient Details Name: [name] MRN: [id number] DOB: 1926/08/07 Today's Date: 05/09/2015    History of Present Illness Pt s/p fall with L intertrochanteric femur fx and IM nailing repair.     Clinical Impression   Patient admitted as above and presents with decreased ADL independence and safety due to deficits listed below, TDWB LLE, and dizziness with OOB activity. OT will follow. Recommend SNF as d/c plan.    Follow Up Recommendations  SNF    Equipment Recommendations  Other (comment) (tbd at next venue of care)    Recommendations for Other Services       Precautions / Restrictions Precautions Precautions: Fall Restrictions Weight Bearing Restrictions: Yes LLE Weight Bearing: Touchdown weight bearing LLE Partial Weight Bearing Percentage or Pounds: 30%      Mobility Bed Mobility Overal bed mobility: Needs Assistance;+2 for physical assistance Bed Mobility: Supine to Sit     Supine to sit: Min assist;Mod assist;+2 for physical assistance     General bed mobility comments: cues for sequence and use of R LE to self assist.  Pt assisted to EOB with pad  Transfers Overall transfer level: Needs assistance Equipment used: Rolling walker (2 wheeled) Transfers: Sit to/from Stand Sit to Stand: Min assist;Mod assist;+2 physical assistance;+2 safety/equipment         General transfer comment: cues for LE management and use of UEs to self assist    Balance                                            ADL Overall ADL's : Needs assistance/impaired Eating/Feeding: Set up;Sitting   Grooming: Set up;Sitting       Lower Body Bathing: Total assistance       Lower Body Dressing: Total assistance   Toilet Transfer: Minimal assistance;BSC;RW;Moderate assistance;+2 for physical assistance;+2 for safety/equipment   Toileting- Clothing Manipulation and Hygiene: Total assistance       Functional mobility during  ADLs: Minimal assistance;Moderate assistance;+2 for physical assistance;+2 for safety/equipment General ADL Comments: Focused on basic functional mobility this session with patient passing out during session for about 3 seconds, then aroused. RN made aware and pt left in care of RN. Up in recliner at end of session.     Vision     Perception     Praxis      Pertinent Vitals/Pain Pain Assessment: 0-10 Pain Score: 8  (8/10 with activity; 2/10 at rest) Pain Location: L hip Pain Descriptors / Indicators: Aching;Sore Pain Intervention(s): Limited activity within patient's tolerance;Monitored during session;Premedicated before session;Ice applied     Hand Dominance     Extremity/Trunk Assessment Upper Extremity Assessment Upper Extremity Assessment: Overall WFL for tasks assessed   Lower Extremity Assessment Lower Extremity Assessment: Defer to PT evaluation LLE: Unable to fully assess due to pain   Cervical / Trunk Assessment Cervical / Trunk Assessment: Normal   Communication Communication Communication: No difficulties   Cognition Arousal/Alertness: Awake/alert Behavior During Therapy: WFL for tasks assessed/performed Overall Cognitive Status: Within Functional Limits for tasks assessed                     General Comments       Exercises Exercises: General Lower Extremity     Shoulder Instructions      Home Living Family/patient expects to be discharged to:: Skilled nursing facility Living Arrangements:  Alone                                      Prior Functioning/Environment Level of Independence: Independent             OT Diagnosis: Acute pain;Generalized weakness   OT Problem List: Decreased strength;Decreased range of motion;Decreased activity tolerance;Impaired balance (sitting and/or standing);Decreased knowledge of use of DME or AE;Decreased knowledge of precautions;Pain   OT Treatment/Interventions: Self-care/ADL  training;DME and/or AE instruction;Therapeutic activities;Patient/family education    OT Goals(Current goals can be found in the care plan section) Acute Rehab OT Goals Patient Stated Goal: Regain IND to return home and live alone OT Goal Formulation: With patient Time For Goal Achievement: 05/23/15 Potential to Achieve Goals: Good ADL Goals Pt Will Perform Lower Body Bathing: with min assist;sit to/from stand Pt Will Perform Lower Body Dressing: with min assist;sit to/from stand Pt Will Transfer to Toilet: with min assist;bedside commode Pt Will Perform Toileting - Clothing Manipulation and hygiene: with min assist;sit to/from stand  OT Frequency: Min 1X/week   Barriers to D/C: Decreased caregiver support  lives alone       Co-evaluation PT/OT/SLP Co-Evaluation/Treatment: Yes Reason for Co-Treatment: For patient/therapist safety PT goals addressed during session: Mobility/safety with mobility OT goals addressed during session: ADL's and self-care      End of Session Equipment Utilized During Treatment: Rolling walker Nurse Communication: Mobility status  Activity Tolerance: Other (comment) (patient limited by lightheadedness/passing out during sessio) Patient left: in chair;with call bell/phone within reach;with chair alarm set;with nursing/sitter in room;with family/visitor present   Time: 1115-1138 OT Time Calculation (min): 23 min Charges:  OT General Charges $OT Visit: 1 Procedure OT Evaluation $OT Eval Moderate Complexity: 1 Procedure G-Codes:    Minha Fulco A Jun 02, 2015, 12:40 PM

## 2015-05-09 NOTE — Progress Notes (Signed)
[facility]                                                                              Patient Demographics  [name], is a 82 y.o. female, DOB - 1926/12/16, [id number]  Admit date - 05/08/2015   Admitting Physician Cristal Ford, DO  Outpatient Primary MD for the patient is [name], MD  LOS - 1   Chief Complaint  Patient presents with  . Fall  . Hip Pain      HPI on 05/08/2015  [name] is a 82 y.o. female with a history of hypertension, mitral valve prolapse, polycythemia, that presented to the emergency department after sustaining a fall and having left leg pain. Patient states her leg simply "gave out" after going down one stair. She denies any dizziness prior to the episode. Patient does not use a walker or cane for ambulation. She does live alone. Currently patient denies any chest pain, shortness breath, abdominal pain, dizziness or headache. She does complain of pain left hip which radiates to her left thigh. In the emergency department, x-rays did show intertrochanteric fracture of the left femur. TR[facility] called for admission.  Assessment & Plan   Left intertrochanteric femur fracture -Status post fall -X-ray showed angulated intertrochanteric left femur fracture -Orthopedics, Dr. Lyla Glassing, consulted and appreciated.  -S/p left intramedullary fixation of left pertrochanteric femur fracture -Continue pain control -Given patient's age and comorbidities, she is low to moderate risk. -Spoke with Dr. Acie Fredrickson, patient's cardiologist, patient is felt to be low risk for surgery. -Patient does live alone, will likely need SNF placement.  -PT and OT consulted today  Leukocytosis -Likely reactive, continue to monitor CBC -Patient currently afebrile -Denies any urinary symptoms, chest x-ray unremarkable  Essential hypertension/Mitral valve prolpase -BP has been soft, will hold antihypertensives for now  GERD -Continue PPI  History of breast  cancer -Stable  Polycythemia vera/thombocytosis  -Stable -Will restart agrylin  Code Status: DNR  Family Communication: None at bedside  Disposition Plan: Admitted. Pending PT/OT.  Time Spent in minutes   30 minutes  Procedures  left intramedullary fixation of left pertrochanteric femur fracture  Consults   Orthopedic surgery, Dr. Lyla Glassing  DVT Prophylaxis  SCDs/Lovenox  Lab Results  Component Value Date   PLT 481* 05/09/2015    Medications  Scheduled Meds: . amLODipine  1.25 mg Oral Daily  . calcium-vitamin D  1 tablet Oral Daily  . enoxaparin (LOVENOX) injection  30 mg Subcutaneous Q24H  . hydrochlorothiazide  12.5 mg Oral Daily  . lisinopril  20 mg Oral Daily  . metoprolol succinate  50 mg Oral Daily  . multivitamin with minerals  1 tablet Oral Daily  . potassium chloride  10 mEq Oral Daily   Continuous Infusions: . sodium chloride 75 mL/hr at 05/08/15 2111   PRN Meds:.acetaminophen **OR** acetaminophen, dicyclomine, fluticasone, HYDROcodone-acetaminophen, menthol-cetylpyridinium **OR** phenol, metoCLOPramide **OR** metoCLOPramide (REGLAN) injection, morphine injection, ondansetron (ZOFRAN) IV, pantoprazole  Antibiotics    Anti-infectives    Start     Dose/Rate Route Frequency Ordered Stop   05/09/15 0030  ceFAZolin (ANCEF) IVPB 2g/100 mL premix     2 g 200 mL/hr over 30 Minutes Intravenous  Every 6 hours 05/08/15 2110 05/09/15 0708   05/08/15 1800  ceFAZolin (ANCEF) IVPB 2g/100 mL premix     2 g 200 mL/hr over 30 Minutes Intravenous On call to O.R. 05/08/15 1404 05/08/15 1830      Subjective:   [name] seen and examined today.  Patient complains of left leg pain, but states it is mild.  She denies chest pain, shortness of breath, abdominal pain, headache, dizziness.   Objective:   Filed Vitals:   05/08/15 2338 05/09/15 0039 05/09/15 0627 05/09/15 0900  BP: 95/46 93/43 100/51 99/40  Pulse: 79 73 79 83  Temp: 97.8 F (36.6 C) 97.7 F (36.5  C) 97.5 F (36.4 C)   TempSrc: Axillary Oral Oral   Resp: 20 17 18    Height:      Weight:      SpO2: 100% 98% 100%     Wt Readings from Last 3 Encounters:  05/08/15 43.4 kg (95 lb 10.9 oz)  01/07/15 43.146 kg (95 lb 1.9 oz)  12/30/14 43.455 kg (95 lb 12.8 oz)     Intake/Output Summary (Last 24 hours) at 05/09/15 0934 Last data filed at 05/09/15 0900  Gross per 24 hour  Intake    940 ml  Output    350 ml  Net    590 ml    Exam  General: Well developed, well nourished, NAD, appears stated age  65: NCAT,  mucous membranes moist.   Cardiovascular: S1 S2 auscultated, 1/6 SEM, RRR  Respiratory: Clear to auscultation bilaterally   Abdomen: Soft, nontender, nondistended, + bowel sounds  Extremities: warm dry without cyanosis clubbing or edema. Left hip dressing  Neuro: AAOx3, nonfocal  Psych: Normal affect and demeanor  Data Review   Micro Results Recent Results (from the past 240 hour(s))  Surgical pcr screen     Status: None   Collection Time: 05/08/15  3:31 PM  Result Value Ref Range Status   MRSA, PCR NEGATIVE NEGATIVE Final   Staphylococcus aureus NEGATIVE NEGATIVE Final    Comment:        The Xpert SA Assay (FDA approved for NASAL specimens in patients over 24 years of age), is one component of a comprehensive surveillance program.  Test performance has been validated by [facility] for patients greater than or equal to 1 year old. It is not intended to diagnose infection nor to guide or monitor treatment.     Radiology Reports Dg Chest 1 View  05/08/2015  CLINICAL DATA:  Preop for left hip fracture. EXAM: CHEST 1 VIEW COMPARISON:  12/31/2005 FINDINGS: Normal heart size and mediastinal contours. No acute infiltrate or edema. No effusion or pneumothorax. L1 superior endplate fracture which is chronic based on 2016 lumbar spine MRI. Remote lateral right seventh rib fracture. No acute osseous findings. IMPRESSION: No evidence of active disease.  Electronically Signed   By: Monte Fantasia M.D.   On: 05/08/2015 09:41   Pelvis Portable  05/08/2015  CLINICAL DATA:  ORIF of the left hip. EXAM: PORTABLE PELVIS 1-2 VIEWS COMPARISON:  Films earlier today. FINDINGS: Postoperative film shows improved alignment at the level of intratrochanteric fracture with intramedullary nail placement. Old fractures of the left pubic rami present. IMPRESSION: Improved alignment at the level of the left hip intertrochanteric fracture after ORIF. Electronically Signed   By: Aletta Edouard M.D.   On: 05/08/2015 20:53   Dg Knee Left Port  05/08/2015  CLINICAL DATA:  Intertrochanteric left femur fracture. Initial encounter. EXAM: PORTABLE LEFT  KNEE - 1-2 VIEW COMPARISON:  None. FINDINGS: Limited patient positioning due to ipsilateral hip fracture. No evidence of fracture or subluxation at the knee. No visualized joint effusion but limited by obliquity. Osteopenia. IMPRESSION: Limited study with no evidence of fracture at the knee. Electronically Signed   By: Monte Fantasia M.D.   On: 05/08/2015 12:40   Dg C-arm 1-60 Min-no Report  05/08/2015  CLINICAL DATA: surgery C-ARM 1-60 MINUTES Fluoroscopy was utilized by the requesting physician.  No radiographic interpretation.   Dg Hip Unilat With Pelvis 2-3 Views Left  05/08/2015  CLINICAL DATA:  Fall with left hip pain.  Initial encounter. EXAM: DG HIP (WITH OR WITHOUT PELVIS) 2-3V LEFT COMPARISON:  None. FINDINGS: Acute intertrochanteric left femur fracture with varus angulation. Remote left obturator ring fracture with healed distortion. No evidence of acute pelvic fracture. Located hips. IMPRESSION: 1. Angulated intertrochanteric left femur fracture. 2. Remote left obturator ring fractures. Electronically Signed   By: Monte Fantasia M.D.   On: 05/08/2015 09:35   Dg Femur Min 2 Views Left  05/08/2015  CLINICAL DATA:  Left intra medullary nail EXAM: LEFT FEMUR 2 VIEWS COMPARISON:  05/08/2015 FINDINGS: Improved positioning and  alignment intertrochanteric left femur fracture. Intra medullary nail across fracture identified. Radiation Exposure Index (as provided by the fluoroscopic device): 5.39 mGy Number of Acquired Images:  5 IMPRESSION: postoperative change Electronically Signed   By: Skipper Cliche M.D.   On: 05/08/2015 20:06   Dg Femur Port Min 2 Views Left  05/08/2015  CLINICAL DATA:  Status post ORIF of basilar intratrochanteric left hip fracture. EXAM: LEFT FEMUR PORTABLE 2 VIEWS COMPARISON:  Films earlier today. FINDINGS: Alignment of the proximal left femur appears improved after placement of an intramedullary nail. There is some mild displacement remaining at the level of the intertrochanteric fracture. IMPRESSION: Improved alignment following ORIF of the left femur. Electronically Signed   By: Aletta Edouard M.D.   On: 05/08/2015 21:03    CBC  Recent Labs Lab 05/08/15 1054 05/09/15 0355  WBC 14.0* 27.1*  HGB 13.5 10.4*  HCT 43.5 34.2*  PLT 505* 481*  MCV 64.9* 66.9*  MCH 20.1* 20.4*  MCHC 31.0 30.4  RDW 18.1* 18.1*  LYMPHSABS 1.0  --   MONOABS 0.7  --   EOSABS 0.3  --   BASOSABS 0.0  --     Chemistries   Recent Labs Lab 05/08/15 1054 05/09/15 0355  NA 134* 135  K 4.2 4.5  CL 100* 101  CO2 24 26  GLUCOSE 104* 165*  BUN 22* 22*  CREATININE 1.01* 1.26*  CALCIUM 9.7 8.5*   ------------------------------------------------------------------------------------------------------------------ estimated creatinine clearance is 22 mL/min (by C-G formula based on Cr of 1.26). ------------------------------------------------------------------------------------------------------------------ No results for input(s): HGBA1C in the last 72 hours. ------------------------------------------------------------------------------------------------------------------ No results for input(s): CHOL, HDL, LDLCALC, TRIG, CHOLHDL, LDLDIRECT in the last 72  hours. ------------------------------------------------------------------------------------------------------------------ No results for input(s): TSH, T4TOTAL, T3FREE, THYROIDAB in the last 72 hours.  Invalid input(s): FREET3 ------------------------------------------------------------------------------------------------------------------ No results for input(s): VITAMINB12, FOLATE, FERRITIN, TIBC, IRON, RETICCTPCT in the last 72 hours.  Coagulation profile No results for input(s): INR, PROTIME in the last 168 hours.  No results for input(s): DDIMER in the last 72 hours.  Cardiac Enzymes No results for input(s): CKMB, TROPONINI, MYOGLOBIN in the last 168 hours.  Invalid input(s): CK ------------------------------------------------------------------------------------------------------------------ Invalid input(s): POCBNP    Daneli Butkiewicz D.O. on 05/09/2015 at 9:34 AM  Between 7am to 7pm - Pager - [phone]  After 7pm go  to [email or web address] - password [email or web address]  And look for the night coverage person covering for me after hours  [facility] Office  [phone]

## 2015-05-09 NOTE — Progress Notes (Signed)
Nurse and Nurse tech transferred patient to Telemetry floor, Room 1410. Report given to [facility] on Telemetry floor. Dana at bedside, has no questions at this time.

## 2015-05-09 NOTE — Progress Notes (Signed)
   Subjective:  Patient reports pain as mild to moderate.  No c/o.  Objective:   VITALS:   Filed Vitals:   05/08/15 2238 05/08/15 2338 05/09/15 0039 05/09/15 0627  BP: 95/45 95/46 93/43  100/51  Pulse: 77 79 73 79  Temp: 97.8 F (36.6 C) 97.8 F (36.6 C) 97.7 F (36.5 C) 97.5 F (36.4 C)  TempSrc: Oral Axillary Oral Oral  Resp: 20 20 17 18   Height:      Weight:      SpO2: 100% 100% 98% 100%    ABD soft Sensation intact distally Intact pulses distally Dorsiflexion/Plantar flexion intact Incision: dressing C/D/I Compartment soft   Lab Results  Component Value Date   WBC 27.1* 05/09/2015   HGB 10.4* 05/09/2015   HCT 34.2* 05/09/2015   MCV 66.9* 05/09/2015   PLT 481* 05/09/2015   BMET    Component Value Date/Time   NA 135 05/09/2015 0355   NA 134* 03/25/2014 1000   K 4.5 05/09/2015 0355   K 4.6 03/25/2014 1000   CL 101 05/09/2015 0355   CL 99 05/25/2012 0942   CO2 26 05/09/2015 0355   CO2 27 03/25/2014 1000   GLUCOSE 165* 05/09/2015 0355   GLUCOSE 99 03/25/2014 1000   GLUCOSE 103* 05/25/2012 0942   BUN 22* 05/09/2015 0355   BUN 15.7 03/25/2014 1000   CREATININE 1.26* 05/09/2015 0355   CREATININE 1.05* 01/07/2015 1520   CREATININE 1.2* 03/25/2014 1000   CALCIUM 8.5* 05/09/2015 0355   CALCIUM 9.9 03/25/2014 1000   GFRNONAA 37* 05/09/2015 0355   GFRAA 43* 05/09/2015 0355     Assessment/Plan: 1 Day Post-Op   Active Problems:   Polycythemia vera (HCC)   HTN (hypertension)   MVP (mitral valve prolapse)   Closed left hip fracture (HCC)   Leukocytosis   Fracture, intertrochanteric, left femur ([place])   TDWB LLE with walker DVT ppx: lovenox x30 days, SCDs, TEDs PO pain control PT/OT D/C planning   Gurfateh Mcclain, Horald Pollen 05/09/2015, 7:16 AM   Rod Can, MD Cell [phone]

## 2015-05-10 ENCOUNTER — Inpatient Hospital Stay (HOSPITAL_COMMUNITY): Payer: Medicare Other

## 2015-05-10 DIAGNOSIS — R55 Syncope and collapse: Secondary | ICD-10-CM

## 2015-05-10 LAB — CBC
HCT: 25.8 % — ABNORMAL LOW (ref 36.0–46.0)
HEMOGLOBIN: 7.9 g/dL — AB (ref 12.0–15.0)
MCH: 20.4 pg — AB (ref 26.0–34.0)
MCHC: 30.6 g/dL (ref 30.0–36.0)
MCV: 66.7 fL — ABNORMAL LOW (ref 78.0–100.0)
Platelets: 490 10*3/uL — ABNORMAL HIGH (ref 150–400)
RBC: 3.87 MIL/uL (ref 3.87–5.11)
RDW: 18.5 % — ABNORMAL HIGH (ref 11.5–15.5)
WBC: 16.5 10*3/uL — AB (ref 4.0–10.5)

## 2015-05-10 LAB — FERRITIN: Ferritin: 10 ng/mL — ABNORMAL LOW (ref 11–307)

## 2015-05-10 LAB — IRON AND TIBC
IRON: 6 ug/dL — AB (ref 28–170)
Saturation Ratios: 2 % — ABNORMAL LOW (ref 10.4–31.8)
TIBC: 259 ug/dL (ref 250–450)
UIBC: 253 ug/dL

## 2015-05-10 LAB — BASIC METABOLIC PANEL
ANION GAP: 5 (ref 5–15)
BUN: 24 mg/dL — AB (ref 6–20)
CHLORIDE: 102 mmol/L (ref 101–111)
CO2: 24 mmol/L (ref 22–32)
Calcium: 8.2 mg/dL — ABNORMAL LOW (ref 8.9–10.3)
Creatinine, Ser: 1.04 mg/dL — ABNORMAL HIGH (ref 0.44–1.00)
GFR calc Af Amer: 55 mL/min — ABNORMAL LOW (ref >60)
GFR, EST NON AFRICAN AMERICAN: 47 mL/min — AB (ref >60)
Glucose, Bld: 135 mg/dL — ABNORMAL HIGH (ref 65–99)
POTASSIUM: 4 mmol/L (ref 3.5–5.1)
SODIUM: 131 mmol/L — AB (ref 135–145)

## 2015-05-10 LAB — FOLATE: FOLATE: 21.7 ng/mL (ref >5.9)

## 2015-05-10 LAB — RETICULOCYTES
RBC.: 3.83 MIL/uL — AB (ref 3.87–5.11)
RETIC COUNT ABSOLUTE: 99.6 10*3/uL (ref 19.0–186.0)
Retic Ct Pct: 2.6 % (ref 0.4–3.1)

## 2015-05-10 LAB — ECHOCARDIOGRAM COMPLETE
HEIGHTINCHES: 63 in
WEIGHTICAEL: 1530.87 [oz_av]

## 2015-05-10 LAB — VITAMIN B12: VITAMIN B 12: 1067 pg/mL — AB (ref 180–914)

## 2015-05-10 LAB — PREPARE RBC (CROSSMATCH)

## 2015-05-10 LAB — MAGNESIUM: MAGNESIUM: 1.9 mg/dL (ref 1.7–2.4)

## 2015-05-10 MED ORDER — SODIUM CHLORIDE 0.9 % IV SOLN
Freq: Once | INTRAVENOUS | Status: AC
  Administered 2015-05-11: 19:00:00 via INTRAVENOUS

## 2015-05-10 NOTE — Progress Notes (Signed)
[facility]                                                                              Patient Demographics  [name], is a 82 y.o. female, DOB - 1926-11-21, [id number]  Admit date - 05/08/2015   Admitting Physician Cristal Ford, DO  Outpatient Primary MD for the patient is [name], MD  LOS - 2   Chief Complaint  Patient presents with  . Fall  . Hip Pain      HPI on 05/08/2015  [name] is a 82 y.o. female with a history of hypertension, mitral valve prolapse, polycythemia, that presented to the emergency department after sustaining a fall and having left leg pain. Patient states her leg simply "gave out" after going down one stair. She denies any dizziness prior to the episode. Patient does not use a walker or cane for ambulation. She does live alone. Currently patient denies any chest pain, shortness breath, abdominal pain, dizziness or headache. She does complain of pain left hip which radiates to her left thigh. In the emergency department, x-rays did show intertrochanteric fracture of the left femur. [facility] called for admission.  Assessment & Plan   Syncope/Orthostatic Hypotension -Patient was working with physical therapy on 05/09/2015, while going from a sitting to standing position, patient became near syncopal. This again occurred yesterday afternoon, patient lost consciousness for approximately 3 seconds. -All antihypertensive medications were held -Continue telemetry monitoring -Echocardiogram pending -Cardiology consulted and appreciated -Possibly secondary to anemia -Continue to monitor closely  Acute blood loss anemia/Symptomatic anemia -Status post recent surgery  -On admission, hemoglobin was 13, currently 7.9  -Given that patient has had questionable syncopal episodes, will transfuse 1 unit PRBC  -Anemia panel, FOBT pending  Left intertrochanteric femur fracture -Status post fall -X-ray showed angulated intertrochanteric left  femur fracture -Orthopedics, Dr. Lyla Glassing, consulted and appreciated. Recommended lovenox for 30days, SCDS, TEDS  -S/p left intramedullary fixation of left pertrochanteric femur fracture -Continue pain control -Given patient's age and comorbidities, she is low to moderate risk. -Spoke with Dr. Acie Fredrickson, patient's cardiologist, patient is felt to be low risk for surgery. -PT and OT consulted, recommended SNF  Leukocytosis -Likely reactive -WBC trending downward -Patient currently afebrile -Denies any urinary symptoms, chest x-ray unremarkable -Continue to monitor CBC  Essential hypertension/Mitral valve prolpase -BP has been soft, will hold antihypertensives for now  GERD -Continue PPI  History of breast cancer -Stable  Polycythemia vera/thombocytosis  -Stable, continue agrylin  Code Status: DNR  Family Communication: None at bedside  Disposition Plan: Admitted. Pending PT/OT.  Time Spent in minutes   30 minutes  Procedures  left intramedullary fixation of left pertrochanteric femur fracture  Consults   Orthopedic surgery, Dr. Lyla Glassing Cardiology  DVT Prophylaxis  SCDs/Lovenox  Lab Results  Component Value Date   PLT 490* 05/10/2015    Medications  Scheduled Meds: . sodium chloride   Intravenous Once  . amLODipine  1.25 mg Oral Daily  . anagrelide  0.5 mg Oral QAC supper  . anagrelide  1 mg Oral Daily  . calcium-vitamin D  1 tablet Oral Daily  . enoxaparin (LOVENOX) injection  30 mg Subcutaneous Q24H  .  feeding supplement (ENSURE ENLIVE)  237 mL Oral BID BM  . hydrochlorothiazide  12.5 mg Oral Daily  . lisinopril  20 mg Oral Daily  . metoprolol succinate  50 mg Oral Daily  . multivitamin with minerals  1 tablet Oral Daily  . potassium chloride  10 mEq Oral Daily   Continuous Infusions: . sodium chloride 75 mL/hr at 05/10/15 0047   PRN Meds:.acetaminophen **OR** acetaminophen, dicyclomine, fluticasone, HYDROcodone-acetaminophen, menthol-cetylpyridinium  **OR** phenol, metoCLOPramide **OR** metoCLOPramide (REGLAN) injection, morphine injection, ondansetron (ZOFRAN) IV, pantoprazole  Antibiotics    Anti-infectives    Start     Dose/Rate Route Frequency Ordered Stop   05/09/15 0030  ceFAZolin (ANCEF) IVPB 2g/100 mL premix     2 g 200 mL/hr over 30 Minutes Intravenous Every 6 hours 05/08/15 2110 05/09/15 0708   05/08/15 1800  ceFAZolin (ANCEF) IVPB 2g/100 mL premix     2 g 200 mL/hr over 30 Minutes Intravenous On call to O.R. 05/08/15 1404 05/08/15 1830      Subjective:   [name] seen and examined today.  Patient complains of left leg pain when she moves.  States she did "go out of it for a few seconds yesterday." She denies chest pain, shortness of breath, abdominal pain, headache, dizziness.   Objective:   Filed Vitals:   05/09/15 1300 05/09/15 1428 05/09/15 1820 05/10/15 0548  BP: 110/46 108/44 132/47 130/55  Pulse: 89 94 116 111  Temp: 97.5 F (36.4 C) 97.8 F (36.6 C) 97.9 F (36.6 C) 97.5 F (36.4 C)  TempSrc: Oral Oral Oral Oral  Resp: 18 18 18 18   Height:      Weight:      SpO2: 94% 96% 95% 98%    Wt Readings from Last 3 Encounters:  05/08/15 43.4 kg (95 lb 10.9 oz)  01/07/15 43.146 kg (95 lb 1.9 oz)  12/30/14 43.455 kg (95 lb 12.8 oz)     Intake/Output Summary (Last 24 hours) at 05/10/15 1022 Last data filed at 05/10/15 0900  Gross per 24 hour  Intake   1335 ml  Output    200 ml  Net   1135 ml    Exam  General: Well developed, well nourished, NAD  HEENT: NCAT,  mucous membranes moist.   Cardiovascular: S1 S2 auscultated, 1/6 SEM, RRR  Respiratory: Clear to auscultation bilaterally   Abdomen: Soft, nontender, nondistended, + bowel sounds  Extremities: warm dry without cyanosis clubbing or edema. Left hip dressing  Neuro: AAOx3, nonfocal  Psych: Normal affect and demeanor, pleasant  Data Review   Micro Results Recent Results (from the past 240 hour(s))  Surgical pcr screen     Status:  None   Collection Time: 05/08/15  3:31 PM  Result Value Ref Range Status   MRSA, PCR NEGATIVE NEGATIVE Final   Staphylococcus aureus NEGATIVE NEGATIVE Final    Comment:        The Xpert SA Assay (FDA approved for NASAL specimens in patients over 64 years of age), is one component of a comprehensive surveillance program.  Test performance has been validated by [facility] for patients greater than or equal to 10 year old. It is not intended to diagnose infection nor to guide or monitor treatment.     Radiology Reports Dg Chest 1 View  05/08/2015  CLINICAL DATA:  Preop for left hip fracture. EXAM: CHEST 1 VIEW COMPARISON:  12/31/2005 FINDINGS: Normal heart size and mediastinal contours. No acute infiltrate or edema. No effusion or pneumothorax. L1  superior endplate fracture which is chronic based on 2016 lumbar spine MRI. Remote lateral right seventh rib fracture. No acute osseous findings. IMPRESSION: No evidence of active disease. Electronically Signed   By: Monte Fantasia M.D.   On: 05/08/2015 09:41   Pelvis Portable  05/08/2015  CLINICAL DATA:  ORIF of the left hip. EXAM: PORTABLE PELVIS 1-2 VIEWS COMPARISON:  Films earlier today. FINDINGS: Postoperative film shows improved alignment at the level of intratrochanteric fracture with intramedullary nail placement. Old fractures of the left pubic rami present. IMPRESSION: Improved alignment at the level of the left hip intertrochanteric fracture after ORIF. Electronically Signed   By: Aletta Edouard M.D.   On: 05/08/2015 20:53   Dg Knee Left Port  05/08/2015  CLINICAL DATA:  Intertrochanteric left femur fracture. Initial encounter. EXAM: PORTABLE LEFT KNEE - 1-2 VIEW COMPARISON:  None. FINDINGS: Limited patient positioning due to ipsilateral hip fracture. No evidence of fracture or subluxation at the knee. No visualized joint effusion but limited by obliquity. Osteopenia. IMPRESSION: Limited study with no evidence of fracture at the knee.  Electronically Signed   By: Monte Fantasia M.D.   On: 05/08/2015 12:40   Dg C-arm 1-60 Min-no Report  05/08/2015  CLINICAL DATA: surgery C-ARM 1-60 MINUTES Fluoroscopy was utilized by the requesting physician.  No radiographic interpretation.   Dg Hip Unilat With Pelvis 2-3 Views Left  05/08/2015  CLINICAL DATA:  Fall with left hip pain.  Initial encounter. EXAM: DG HIP (WITH OR WITHOUT PELVIS) 2-3V LEFT COMPARISON:  None. FINDINGS: Acute intertrochanteric left femur fracture with varus angulation. Remote left obturator ring fracture with healed distortion. No evidence of acute pelvic fracture. Located hips. IMPRESSION: 1. Angulated intertrochanteric left femur fracture. 2. Remote left obturator ring fractures. Electronically Signed   By: Monte Fantasia M.D.   On: 05/08/2015 09:35   Dg Femur Min 2 Views Left  05/08/2015  CLINICAL DATA:  Left intra medullary nail EXAM: LEFT FEMUR 2 VIEWS COMPARISON:  05/08/2015 FINDINGS: Improved positioning and alignment intertrochanteric left femur fracture. Intra medullary nail across fracture identified. Radiation Exposure Index (as provided by the fluoroscopic device): 5.39 mGy Number of Acquired Images:  5 IMPRESSION: postoperative change Electronically Signed   By: Skipper Cliche M.D.   On: 05/08/2015 20:06   Dg Femur Port Min 2 Views Left  05/08/2015  CLINICAL DATA:  Status post ORIF of basilar intratrochanteric left hip fracture. EXAM: LEFT FEMUR PORTABLE 2 VIEWS COMPARISON:  Films earlier today. FINDINGS: Alignment of the proximal left femur appears improved after placement of an intramedullary nail. There is some mild displacement remaining at the level of the intertrochanteric fracture. IMPRESSION: Improved alignment following ORIF of the left femur. Electronically Signed   By: Aletta Edouard M.D.   On: 05/08/2015 21:03    CBC  Recent Labs Lab 05/08/15 1054 05/09/15 0355 05/10/15 0547  WBC 14.0* 27.1* 16.5*  HGB 13.5 10.4* 7.9*  HCT 43.5 34.2*  25.8*  PLT 505* 481* 490*  MCV 64.9* 66.9* 66.7*  MCH 20.1* 20.4* 20.4*  MCHC 31.0 30.4 30.6  RDW 18.1* 18.1* 18.5*  LYMPHSABS 1.0  --   --   MONOABS 0.7  --   --   EOSABS 0.3  --   --   BASOSABS 0.0  --   --     Chemistries   Recent Labs Lab 05/08/15 1054 05/09/15 0355 05/10/15 0547  NA 134* 135 131*  K 4.2 4.5 4.0  CL 100* 101 102  CO2 24  26 24  GLUCOSE 104* 165* 135*  BUN 22* 22* 24*  CREATININE 1.01* 1.26* 1.04*  CALCIUM 9.7 8.5* 8.2*  MG  --   --  1.9   ------------------------------------------------------------------------------------------------------------------ estimated creatinine clearance is 26.6 mL/min (by C-G formula based on Cr of 1.04). ------------------------------------------------------------------------------------------------------------------ No results for input(s): HGBA1C in the last 72 hours. ------------------------------------------------------------------------------------------------------------------ No results for input(s): CHOL, HDL, LDLCALC, TRIG, CHOLHDL, LDLDIRECT in the last 72 hours. ------------------------------------------------------------------------------------------------------------------ No results for input(s): TSH, T4TOTAL, T3FREE, THYROIDAB in the last 72 hours.  Invalid input(s): FREET3 ------------------------------------------------------------------------------------------------------------------ No results for input(s): VITAMINB12, FOLATE, FERRITIN, TIBC, IRON, RETICCTPCT in the last 72 hours.  Coagulation profile No results for input(s): INR, PROTIME in the last 168 hours.  No results for input(s): DDIMER in the last 72 hours.  Cardiac Enzymes No results for input(s): CKMB, TROPONINI, MYOGLOBIN in the last 168 hours.  Invalid input(s): CK ------------------------------------------------------------------------------------------------------------------ Invalid input(s): POCBNP    Jona Erkkila D.O. on  05/10/2015 at 10:22 AM  Between 7am to 7pm - Pager - [phone]  After 7pm go to [email or web address] - password [email or web address]  And look for the night coverage person covering for me after hours  [facility] Office  [phone]

## 2015-05-10 NOTE — Progress Notes (Signed)
Received report from [facility] and agree with assessment.

## 2015-05-10 NOTE — Progress Notes (Signed)
Subjective: 2 Days Post-Op Procedure(s) (LRB): INTERTROCHANTERIC FEMORAL NAIL (Left) Patient reports pain as 2 on 0-10 scale.Had Syncopal episodes. These may be do to low HBg. She was instructed not to get out of bed alone.WBC 16.5 and decreasing.    Objective: Vital signs in last 24 hours: Temp:  [97.5 F (36.4 C)-97.9 F (36.6 C)] 97.5 F (36.4 C) (04/08 0548) Pulse Rate:  [78-116] 111 (04/08 0548) Resp:  [18] 18 (04/08 0548) BP: (99-132)/(40-55) 130/55 mmHg (04/08 0548) SpO2:  [94 %-98 %] 98 % (04/08 0548)  Intake/Output from previous day: 04/07 0701 - 04/08 0700 In: 1215 [P.O.:240; I.V.:975] Out: 350 [Urine:350] Intake/Output this shift:     Recent Labs  05/08/15 1054 05/09/15 0355 05/10/15 0547  HGB 13.5 10.4* 7.9*    Recent Labs  05/09/15 0355 05/10/15 0547  WBC 27.1* 16.5*  RBC 5.11 3.87  HCT 34.2* 25.8*  PLT 481* 490*    Recent Labs  05/09/15 0355 05/10/15 0547  NA 135 131*  K 4.5 4.0  CL 101 102  CO2 26 24  BUN 22* 24*  CREATININE 1.26* 1.04*  GLUCOSE 165* 135*  CALCIUM 8.5* 8.2*   No results for input(s): LABPT, INR in the last 72 hours.  Compartment soft  Assessment/Plan: 2 Days Post-Op Procedure(s) (LRB): INTERTROCHANTERIC FEMORAL NAIL (Left) Up with therapy   need to do this gradually. She should have a follow-up HBg  [name] A 05/10/2015, 8:55 AM

## 2015-05-10 NOTE — Progress Notes (Signed)
Echocardiogram 2D Echocardiogram has been performed.  [name] 05/10/2015, 2:48 PM

## 2015-05-11 LAB — CBC
HEMATOCRIT: 28.5 % — AB (ref 36.0–46.0)
Hemoglobin: 9 g/dL — ABNORMAL LOW (ref 12.0–15.0)
MCH: 21.7 pg — AB (ref 26.0–34.0)
MCHC: 31.6 g/dL (ref 30.0–36.0)
MCV: 68.7 fL — AB (ref 78.0–100.0)
PLATELETS: 464 10*3/uL — AB (ref 150–400)
RBC: 4.15 MIL/uL (ref 3.87–5.11)
RDW: 19.6 % — AB (ref 11.5–15.5)
WBC: 16.7 10*3/uL — AB (ref 4.0–10.5)

## 2015-05-11 MED ORDER — SODIUM CHLORIDE 0.9 % IV SOLN
510.0000 mg | Freq: Once | INTRAVENOUS | Status: AC
  Administered 2015-05-11: 510 mg via INTRAVENOUS
  Filled 2015-05-11: qty 17

## 2015-05-11 NOTE — Progress Notes (Signed)
     Subjective: 3 Days Post-Op Procedure(s) (LRB): INTERTROCHANTERIC FEMORAL NAIL (Left)   Patient reports pain as mild, pain controlled. No events throughout the night, but did have an episode of lightheadedness yesterday when working with PT.  Is currently receiving blood. We spent some time reviewing the procedure.  Objective:   VITALS:   Filed Vitals:   05/10/15 2037 05/11/15 0627  BP: 151/54 171/104  Pulse: 113 109  Temp: 98.5 F (36.9 C) 99.1 F (37.3 C)  Resp: 20 20    Dorsiflexion/Plantar flexion intact Incision: dressing C/D/I No cellulitis present Compartment soft  LABS  Recent Labs  05/09/15 0355 05/10/15 0547 05/11/15 0522  HGB 10.4* 7.9* 9.0*  HCT 34.2* 25.8* 28.5*  WBC 27.1* 16.5* 16.7*  PLT 481* 490* 464*     Recent Labs  05/08/15 1054 05/09/15 0355 05/10/15 0547  NA 134* 135 131*  K 4.2 4.5 4.0  BUN 22* 22* 24*  CREATININE 1.01* 1.26* 1.04*  GLUCOSE 104* 165* 135*     Assessment/Plan: 3 Days Post-Op Procedure(s) (LRB): INTERTROCHANTERIC FEMORAL NAIL (Left)  Up with therapy Discharge will be based on medicine     [place] Pugh. Hatice Bubel   PAC  05/11/2015, 9:45 AM

## 2015-05-11 NOTE — Progress Notes (Signed)
Physical Therapy Treatment Patient Details Name: [name] MRN: [id number] DOB: 1926/10/07 Today's Date: 05/11/2015    History of Present Illness Pt s/p fall with L intertrochanteric femur fx and IM nailing repair.      PT Comments    Pt cooperative but continues ltd by dizziness with attempts to stand/ambulate.  BP supine 154/60; sit 167/80 with HR 114; Standing 130/57 with HR 95 and c/o dizziness, nausea and feeling hot.  Follow Up Recommendations  SNF     Equipment Recommendations  None recommended by PT    Recommendations for Other Services OT consult     Precautions / Restrictions Precautions Precautions: Fall Precaution Comments: Pt orthostatic Restrictions Weight Bearing Restrictions: Yes LLE Weight Bearing: Touchdown weight bearing LLE Partial Weight Bearing Percentage or Pounds: 30%    Mobility  Bed Mobility Overal bed mobility: Needs Assistance;+2 for physical assistance Bed Mobility: Supine to Sit;Sit to Supine     Supine to sit: Mod assist;+2 for physical assistance;+2 for safety/equipment Sit to supine: +2 for physical assistance;Mod assist;Max assist   General bed mobility comments: cues for sequence and use of R LE to self assist.  Physical assist to manage L LE and to control trunk  Transfers Overall transfer level: Needs assistance Equipment used: Rolling walker (2 wheeled) Transfers: Sit to/from Stand Sit to Stand: Min assist;Mod assist;+2 physical assistance;+2 safety/equipment         General transfer comment: cues for LE management and use of UEs to self assist  Ambulation/Gait             General Gait Details: Pt stood with RW and assist - maintained standing ~ 1 min before return to sitting  with c/o nausea, dizziness and feeling hot.   Stairs            Wheelchair Mobility    Modified Rankin (Stroke Patients Only)       Balance                                    Cognition Arousal/Alertness:  Awake/alert Behavior During Therapy: WFL for tasks assessed/performed Overall Cognitive Status: Within Functional Limits for tasks assessed                      Exercises General Exercises - Lower Extremity Ankle Circles/Pumps: AROM;Both;15 reps;Supine Quad Sets: AROM;Both;10 reps;Supine Heel Slides: AAROM;Left;15 reps;Supine Hip ABduction/ADduction: AAROM;Left;10 reps;Supine    General Comments        Pertinent Vitals/Pain Pain Assessment: 0-10 Pain Score: 4  Pain Location: L hip Pain Descriptors / Indicators: Aching;Sore Pain Intervention(s): Limited activity within patient's tolerance;Monitored during session;Premedicated before session    Home Living                      Prior Function            PT Goals (current goals can now be found in the care plan section) Acute Rehab PT Goals Patient Stated Goal: Regain IND to return home and live alone PT Goal Formulation: With patient Time For Goal Achievement: 05/17/15 Potential to Achieve Goals: Good Progress towards PT goals: Progressing toward goals    Frequency  Min 3X/week    PT Plan Current plan remains appropriate    Co-evaluation             End of Session Equipment Utilized During Treatment: Gait belt Activity Tolerance:  Patient limited by fatigue;Other (comment) (nausea, dizziness, ) Patient left: in bed;with call bell/phone within reach     Time: [id number] PT Time Calculation (min) (ACUTE ONLY): 34 min  Charges:  $Therapeutic Exercise: 8-22 mins $Therapeutic Activity: 8-22 mins                    G Codes:      Finnigan Warriner May 25, 2015, 3:25 PM

## 2015-05-11 NOTE — Progress Notes (Signed)
PT in to work with patient. Got her up and she became dizzy, faint feeling and pale.  Checked orthostatics on her as follows:  Supine 154/60  Sitting 167/80 HR 114 O2 sat 94%  Standing 130/57 HR 95. Patient put back to bed with no further treatment by PT. Eulas Post RN

## 2015-05-11 NOTE — Progress Notes (Signed)
[facility]                                                                              Patient Demographics  [name], is a 82 y.o. female, DOB - December 05, 1926, [id number]  Admit date - 05/08/2015   Admitting Physician Cristal Ford, DO  Outpatient Primary MD for the patient is [name], MD  LOS - 3   Chief Complaint  Patient presents with  . Fall  . Hip Pain      HPI on 05/08/2015  [name] is a 83 y.o. female with a history of hypertension, mitral valve prolapse, polycythemia, that presented to the emergency department after sustaining a fall and having left leg pain. Patient states her leg simply "gave out" after going down one stair. She denies any dizziness prior to the episode. Patient does not use a walker or cane for ambulation. She does live alone. Currently patient denies any chest pain, shortness breath, abdominal pain, dizziness or headache. She does complain of pain left hip which radiates to her left thigh. In the emergency department, x-rays did show intertrochanteric fracture of the left femur. [facility] called for admission.  Assessment & Plan   Syncope/Orthostatic Hypotension -Patient was working with physical therapy on 05/09/2015, while going from a sitting to standing position, patient became near syncopal twice. -All antihypertensive medications were held.  Will restart as patient BP is now high. -Continue telemetry monitoring -Echocardiogram: EF [id number], Grade 1 diastolic dysfunction -Cardiology consulted and appreciated -Possibly secondary to anemia- patient given 1 unit PRBC on 05/10/2015 -Continue to monitor closely  Acute blood loss anemia/Symptomatic anemia -Status post recent surgery  -On admission, hemoglobin was 13 -Hemoglobin dropped to 7.9 -Patient transfused 1 unit PRBC, hemoglobin today 9 -Anemia panel: Iron 6, ferritin 10 -Given dose of Feraheme -Pending FOBT  Left intertrochanteric femur fracture -Status post  fall -X-ray showed angulated intertrochanteric left femur fracture -Orthopedics, Dr. Lyla Glassing, consulted and appreciated. Recommended lovenox for 30days, SCDS, TEDS  -S/p left intramedullary fixation of left pertrochanteric femur fracture -Continue pain control -Given patient's age and comorbidities, she is low to moderate risk. -Spoke with Dr. Acie Fredrickson, patient's cardiologist, patient is felt to be low risk for surgery. -PT and OT consulted, recommended SNF  Leukocytosis -Likely reactive -WBC trending downward -Patient currently afebrile -Denies any urinary symptoms, chest x-ray unremarkable -Continue to monitor CBC  Essential hypertension/Mitral valve prolpase -Antihypertensives were initially held as patient was hypotensive. -Continue amlodipine, lisinopril, HCTZ, metoprolol  GERD -Continue PPI  History of breast cancer -Stable  Polycythemia vera/thombocytosis  -Stable, continue agrylin  Code Status: DNR  Family Communication: None at bedside  Disposition Plan: Admitted. Pending FOBT. Continue to monitor for initial 24 hours. Likely discharge to SNF in the next 24-48 hours.  Time Spent in minutes   30 minutes  Procedures  left intramedullary fixation of left pertrochanteric femur fracture  Consults   Orthopedic surgery, Dr. Lyla Glassing Cardiology  DVT Prophylaxis  SCDs/Lovenox  Lab Results  Component Value Date   PLT 464* 05/11/2015    Medications  Scheduled Meds: . sodium chloride   Intravenous Once  . amLODipine  1.25 mg Oral Daily  . anagrelide  0.5  mg Oral QAC supper  . anagrelide  1 mg Oral Daily  . calcium-vitamin D  1 tablet Oral Daily  . enoxaparin (LOVENOX) injection  30 mg Subcutaneous Q24H  . feeding supplement (ENSURE ENLIVE)  237 mL Oral BID BM  . hydrochlorothiazide  12.5 mg Oral Daily  . lisinopril  20 mg Oral Daily  . metoprolol succinate  50 mg Oral Daily  . multivitamin with minerals  1 tablet Oral Daily  . potassium chloride  10 mEq  Oral Daily   Continuous Infusions: . sodium chloride 75 mL/hr at 05/10/15 0047   PRN Meds:.acetaminophen **OR** acetaminophen, dicyclomine, fluticasone, HYDROcodone-acetaminophen, menthol-cetylpyridinium **OR** phenol, metoCLOPramide **OR** metoCLOPramide (REGLAN) injection, morphine injection, ondansetron (ZOFRAN) IV, pantoprazole  Antibiotics    Anti-infectives    Start     Dose/Rate Route Frequency Ordered Stop   05/09/15 0030  ceFAZolin (ANCEF) IVPB 2g/100 mL premix     2 g 200 mL/hr over 30 Minutes Intravenous Every 6 hours 05/08/15 2110 05/09/15 0708   05/08/15 1800  ceFAZolin (ANCEF) IVPB 2g/100 mL premix     2 g 200 mL/hr over 30 Minutes Intravenous On call to O.R. 05/08/15 1404 05/08/15 1830      Subjective:   [name] seen and examined today.  Patient complains of left leg pain when she moves.  Denies denies chest pain, shortness of breath, abdominal pain, headache, dizziness.   Objective:   Filed Vitals:   05/10/15 1300 05/10/15 1600 05/10/15 2037 05/11/15 0627  BP: 152/57 157/63 151/54 171/104  Pulse: 115 107 113 109  Temp: 98.8 F (37.1 C) 99.8 F (37.7 C) 98.5 F (36.9 C) 99.1 F (37.3 C)  TempSrc: Oral Oral Oral Oral  Resp: 16 16 20 20   Height:      Weight:      SpO2: 98% 98% 94% 95%    Wt Readings from Last 3 Encounters:  05/08/15 43.4 kg (95 lb 10.9 oz)  01/07/15 43.146 kg (95 lb 1.9 oz)  12/30/14 43.455 kg (95 lb 12.8 oz)     Intake/Output Summary (Last 24 hours) at 05/11/15 0947 Last data filed at 05/10/15 1600  Gross per 24 hour  Intake    335 ml  Output      0 ml  Net    335 ml    Exam  General: Well developed, well nourished, NAD  HEENT: NCAT,  mucous membranes moist.   Cardiovascular: S1 S2 auscultated, 1/6 SEM, RRR  Respiratory: Clear to auscultation bilaterally   Abdomen: Soft, nontender, nondistended, + bowel sounds  Extremities: warm dry without cyanosis clubbing or edema. Left hip dressing  Neuro: AAOx3,  nonfocal  Psych: Appropriate mood and affect, pleasant  Data Review   Micro Results Recent Results (from the past 240 hour(s))  Surgical pcr screen     Status: None   Collection Time: 05/08/15  3:31 PM  Result Value Ref Range Status   MRSA, PCR NEGATIVE NEGATIVE Final   Staphylococcus aureus NEGATIVE NEGATIVE Final    Comment:        The Xpert SA Assay (FDA approved for NASAL specimens in patients over 51 years of age), is one component of a comprehensive surveillance program.  Test performance has been validated by [facility] for patients greater than or equal to 66 year old. It is not intended to diagnose infection nor to guide or monitor treatment.     Radiology Reports Dg Chest 1 View  05/08/2015  CLINICAL DATA:  Preop for left hip  fracture. EXAM: CHEST 1 VIEW COMPARISON:  12/31/2005 FINDINGS: Normal heart size and mediastinal contours. No acute infiltrate or edema. No effusion or pneumothorax. L1 superior endplate fracture which is chronic based on 2016 lumbar spine MRI. Remote lateral right seventh rib fracture. No acute osseous findings. IMPRESSION: No evidence of active disease. Electronically Signed   By: Monte Fantasia M.D.   On: 05/08/2015 09:41   Pelvis Portable  05/08/2015  CLINICAL DATA:  ORIF of the left hip. EXAM: PORTABLE PELVIS 1-2 VIEWS COMPARISON:  Films earlier today. FINDINGS: Postoperative film shows improved alignment at the level of intratrochanteric fracture with intramedullary nail placement. Old fractures of the left pubic rami present. IMPRESSION: Improved alignment at the level of the left hip intertrochanteric fracture after ORIF. Electronically Signed   By: Aletta Edouard M.D.   On: 05/08/2015 20:53   Dg Knee Left Port  05/08/2015  CLINICAL DATA:  Intertrochanteric left femur fracture. Initial encounter. EXAM: PORTABLE LEFT KNEE - 1-2 VIEW COMPARISON:  None. FINDINGS: Limited patient positioning due to ipsilateral hip fracture. No evidence of  fracture or subluxation at the knee. No visualized joint effusion but limited by obliquity. Osteopenia. IMPRESSION: Limited study with no evidence of fracture at the knee. Electronically Signed   By: Monte Fantasia M.D.   On: 05/08/2015 12:40   Dg C-arm 1-60 Min-no Report  05/08/2015  CLINICAL DATA: surgery C-ARM 1-60 MINUTES Fluoroscopy was utilized by the requesting physician.  No radiographic interpretation.   Dg Hip Unilat With Pelvis 2-3 Views Left  05/08/2015  CLINICAL DATA:  Fall with left hip pain.  Initial encounter. EXAM: DG HIP (WITH OR WITHOUT PELVIS) 2-3V LEFT COMPARISON:  None. FINDINGS: Acute intertrochanteric left femur fracture with varus angulation. Remote left obturator ring fracture with healed distortion. No evidence of acute pelvic fracture. Located hips. IMPRESSION: 1. Angulated intertrochanteric left femur fracture. 2. Remote left obturator ring fractures. Electronically Signed   By: Monte Fantasia M.D.   On: 05/08/2015 09:35   Dg Femur Min 2 Views Left  05/08/2015  CLINICAL DATA:  Left intra medullary nail EXAM: LEFT FEMUR 2 VIEWS COMPARISON:  05/08/2015 FINDINGS: Improved positioning and alignment intertrochanteric left femur fracture. Intra medullary nail across fracture identified. Radiation Exposure Index (as provided by the fluoroscopic device): 5.39 mGy Number of Acquired Images:  5 IMPRESSION: postoperative change Electronically Signed   By: Skipper Cliche M.D.   On: 05/08/2015 20:06   Dg Femur Port Min 2 Views Left  05/08/2015  CLINICAL DATA:  Status post ORIF of basilar intratrochanteric left hip fracture. EXAM: LEFT FEMUR PORTABLE 2 VIEWS COMPARISON:  Films earlier today. FINDINGS: Alignment of the proximal left femur appears improved after placement of an intramedullary nail. There is some mild displacement remaining at the level of the intertrochanteric fracture. IMPRESSION: Improved alignment following ORIF of the left femur. Electronically Signed   By: Aletta Edouard M.D.   On: 05/08/2015 21:03    CBC  Recent Labs Lab 05/08/15 1054 05/09/15 0355 05/10/15 0547 05/11/15 0522  WBC 14.0* 27.1* 16.5* 16.7*  HGB 13.5 10.4* 7.9* 9.0*  HCT 43.5 34.2* 25.8* 28.5*  PLT 505* 481* 490* 464*  MCV 64.9* 66.9* 66.7* 68.7*  MCH 20.1* 20.4* 20.4* 21.7*  MCHC 31.0 30.4 30.6 31.6  RDW 18.1* 18.1* 18.5* 19.6*  LYMPHSABS 1.0  --   --   --   MONOABS 0.7  --   --   --   EOSABS 0.3  --   --   --  BASOSABS 0.0  --   --   --     Chemistries   Recent Labs Lab 05/08/15 1054 05/09/15 0355 05/10/15 0547  NA 134* 135 131*  K 4.2 4.5 4.0  CL 100* 101 102  CO2 24 26 24   GLUCOSE 104* 165* 135*  BUN 22* 22* 24*  CREATININE 1.01* 1.26* 1.04*  CALCIUM 9.7 8.5* 8.2*  MG  --   --  1.9   ------------------------------------------------------------------------------------------------------------------ estimated creatinine clearance is 26.6 mL/min (by C-G formula based on Cr of 1.04). ------------------------------------------------------------------------------------------------------------------ No results for input(s): HGBA1C in the last 72 hours. ------------------------------------------------------------------------------------------------------------------ No results for input(s): CHOL, HDL, LDLCALC, TRIG, CHOLHDL, LDLDIRECT in the last 72 hours. ------------------------------------------------------------------------------------------------------------------ No results for input(s): TSH, T4TOTAL, T3FREE, THYROIDAB in the last 72 hours.  Invalid input(s): FREET3 ------------------------------------------------------------------------------------------------------------------  Recent Labs  05/10/15 1013  VITAMINB12 1067*  FOLATE 21.7  FERRITIN 10*  TIBC 259  IRON 6*  RETICCTPCT 2.6    Coagulation profile No results for input(s): INR, PROTIME in the last 168 hours.  No results for input(s): DDIMER in the last 72 hours.  Cardiac  Enzymes No results for input(s): CKMB, TROPONINI, MYOGLOBIN in the last 168 hours.  Invalid input(s): CK ------------------------------------------------------------------------------------------------------------------ Invalid input(s): POCBNP    [place] D.O. on 05/11/2015 at 9:47 AM  Between 7am to 7pm - Pager - [phone]  After 7pm go to [email or web address] - password [email or web address]  And look for the night coverage person covering for me after hours  [facility] Office  [phone]

## 2015-05-11 NOTE — Care Management Important Message (Signed)
Important Message  Patient Details  Name: [name] MRN: [id number] Date of Birth: 12-06-1926   Medicare Important Message Given:  Yes    Apolonio Schneiders, RN 05/11/2015, 10:30 AM

## 2015-05-11 NOTE — Progress Notes (Addendum)
No dc indicated for patient today per MD note.  CSW services will continue to monitor for stability.  Lorie Phenix. Pauline Good, [place] (weekend coverage)

## 2015-05-12 DIAGNOSIS — D62 Acute posthemorrhagic anemia: Secondary | ICD-10-CM

## 2015-05-12 DIAGNOSIS — K219 Gastro-esophageal reflux disease without esophagitis: Secondary | ICD-10-CM

## 2015-05-12 DIAGNOSIS — S72142D Displaced intertrochanteric fracture of left femur, subsequent encounter for closed fracture with routine healing: Secondary | ICD-10-CM

## 2015-05-12 DIAGNOSIS — D72829 Elevated white blood cell count, unspecified: Secondary | ICD-10-CM

## 2015-05-12 LAB — CBC
HCT: 25.1 % — ABNORMAL LOW (ref 36.0–46.0)
HEMOGLOBIN: 8 g/dL — AB (ref 12.0–15.0)
MCH: 22 pg — AB (ref 26.0–34.0)
MCHC: 31.9 g/dL (ref 30.0–36.0)
MCV: 69.1 fL — AB (ref 78.0–100.0)
Platelets: 534 10*3/uL — ABNORMAL HIGH (ref 150–400)
RBC: 3.63 MIL/uL — AB (ref 3.87–5.11)
RDW: 20.3 % — ABNORMAL HIGH (ref 11.5–15.5)
WBC: 15.1 10*3/uL — ABNORMAL HIGH (ref 4.0–10.5)

## 2015-05-12 LAB — BASIC METABOLIC PANEL
Anion gap: 6 (ref 5–15)
BUN: 12 mg/dL (ref 4–21)
BUN: 12 mg/dL (ref 6–20)
CHLORIDE: 103 mmol/L (ref 101–111)
CO2: 23 mmol/L (ref 22–32)
CREATININE: 0.81 mg/dL (ref 0.44–1.00)
Calcium: 8.1 mg/dL — ABNORMAL LOW (ref 8.9–10.3)
Creatinine: 0.8 mg/dL (ref 0.5–1.1)
GFR calc Af Amer: >60 mL/min (ref >60)
GFR calc non Af Amer: >60 mL/min (ref >60)
GLUCOSE: 103 mg/dL — AB (ref 65–99)
Glucose: 103 mg/dL
POTASSIUM: 4 mmol/L (ref 3.5–5.1)
SODIUM: 132 mmol/L — AB (ref 135–145)
Sodium: 132 mmol/L — AB (ref 137–147)

## 2015-05-12 LAB — TYPE AND SCREEN
ABO/RH(D): B POS
ANTIBODY SCREEN: NEGATIVE
UNIT DIVISION: 0

## 2015-05-12 LAB — PREPARE RBC (CROSSMATCH)

## 2015-05-12 MED ORDER — SODIUM CHLORIDE 0.9 % IV SOLN
Freq: Once | INTRAVENOUS | Status: AC
  Administered 2015-05-12: 12:00:00 via INTRAVENOUS

## 2015-05-12 MED ORDER — FUROSEMIDE 10 MG/ML IJ SOLN
20.0000 mg | Freq: Once | INTRAMUSCULAR | Status: AC
  Administered 2015-05-12: 20 mg via INTRAVENOUS
  Filled 2015-05-12: qty 2

## 2015-05-12 NOTE — Progress Notes (Signed)
   Subjective:  Patient reports pain as mild to moderate.  No c/o.  Objective:   VITALS:   Filed Vitals:   05/12/15 0928 05/12/15 1216 05/12/15 1300 05/12/15 1539  BP: 145/56 160/65 156/66 156/71  Pulse: 101 105 110 99  Temp:  98.7 F (37.1 C) 98.7 F (37.1 C) 99.3 F (37.4 C)  TempSrc:  Oral Oral Oral  Resp:  16 18 18   Height:      Weight:      SpO2:        ABD soft Sensation intact distally Intact pulses distally Dorsiflexion/Plantar flexion intact Incision: dressing C/D/I Compartment soft No hematoma  Lab Results  Component Value Date   WBC 15.1* 05/12/2015   HGB 8.0* 05/12/2015   HCT 25.1* 05/12/2015   MCV 69.1* 05/12/2015   PLT 534* 05/12/2015   BMET    Component Value Date/Time   NA 132* 05/12/2015 0509   NA 134* 03/25/2014 1000   K 4.0 05/12/2015 0509   K 4.6 03/25/2014 1000   CL 103 05/12/2015 0509   CL 99 05/25/2012 0942   CO2 23 05/12/2015 0509   CO2 27 03/25/2014 1000   GLUCOSE 103* 05/12/2015 0509   GLUCOSE 99 03/25/2014 1000   GLUCOSE 103* 05/25/2012 0942   BUN 12 05/12/2015 0509   BUN 15.7 03/25/2014 1000   CREATININE 0.81 05/12/2015 0509   CREATININE 1.05* 01/07/2015 1520   CREATININE 1.2* 03/25/2014 1000   CALCIUM 8.1* 05/12/2015 0509   CALCIUM 9.9 03/25/2014 1000   GFRNONAA >60 05/12/2015 0509   GFRAA >60 05/12/2015 0509     Assessment/Plan: 4 Days Post-Op   Active Problems:   Polycythemia vera (HCC)   HTN (hypertension)   MVP (mitral valve prolapse)   Closed left hip fracture (HCC)   Leukocytosis   Fracture, intertrochanteric, left femur (Progress)   Faintness   [facility] LLE with walker lovenox x30 days PT/OT D/C planning   [name], Horald Pollen 05/12/2015, 5:45 PM   Rod Can, MD Cell [phone]

## 2015-05-12 NOTE — Progress Notes (Signed)
[facility]                                                                              Patient Demographics  [name], is a 82 y.o. female, DOB - 1926/02/15, [id number]  Admit date - 05/08/2015   Admitting Physician Cristal Ford, DO  Outpatient Primary MD for the patient is [name], MD  LOS - 4   Chief Complaint  Patient presents with  . Fall  . Hip Pain      HPI on 05/08/2015  [name] is a 82 y.o. female with a history of hypertension, mitral valve prolapse, polycythemia, that presented to the emergency department after sustaining a fall and having left leg pain. Patient states her leg simply "gave out" after going down one stair. She denies any dizziness prior to the episode. Patient does not use a walker or cane for ambulation. She does live alone. Currently patient denies any chest pain, shortness breath, abdominal pain, dizziness or headache. She does complain of pain left hip which radiates to her left thigh. In the emergency department, x-rays did show intertrochanteric fracture of the left femur. [facility] called for admission.  Assessment & Plan   Syncope/Orthostatic Hypotension -Patient was working with physical therapy on 05/09/2015, while going from a sitting to standing position, patient became near syncopal twice. -though to be secondary to symptomatic anemia and antihypertensive drugs -Continue telemetry monitoring -Echocardiogram: EF [id number], Grade 1 diastolic dysfunction -Cardiology consulted and no further rec's at this point  Left intertrochanteric femur fracture -Status post fall and with X-ray showed angulated intertrochanteric left femur fracture -Orthopedics, Dr. Lyla Glassing, consulted and appreciate rec's and assistance.  -S/p left intramedullary fixation of left pertrochanteric femur fracture -Recommended lovenox for 30days, SCDS, TEDS  -Continue pain control -PT and OT consulted, recommended SNF  Acute blood loss anemia -s/p  1 unit of PRBC; Hgb down to 8.0 and mild symptoms of anemia -will transfuse another unit of PRBC -CBC in am  -Anemia panel: Iron 6, ferritin 10 -received IV iron -will discharge on niferex  Leukocytosis -Likely reactive -WBC trending downward -Patient currently afebrile -Denies any urinary symptoms, chest x-ray unremarkable -Continue to monitor CBC  Essential hypertension/Mitral valve prolpase -BP stable -Continue amlodipine, lisinopril, HCTZ, metoprolol  GERD -Continue PPI  History of breast cancer -Stable  Thombocytosis  -Stable, continue agrylin   Code Status: DNR  Family Communication: None at bedside  Disposition Plan: Admitted. Pending FOBT. Continue to monitor for initial 24 hours. Likely discharge to SNF in the next 24 hours.  Time Spent in minutes   30 minutes  Procedures  left intramedullary fixation of left pertrochanteric femur fracture  Consults   Orthopedic surgery, Dr. Lyla Glassing Cardiology  DVT Prophylaxis  SCDs/Lovenox  Lab Results  Component Value Date   PLT 534* 05/12/2015    Medications  Scheduled Meds: . amLODipine  1.25 mg Oral Daily  . anagrelide  0.5 mg Oral QAC supper  . anagrelide  1 mg Oral Daily  . calcium-vitamin D  1 tablet Oral Daily  . enoxaparin (LOVENOX) injection  30 mg Subcutaneous Q24H  . feeding supplement (ENSURE ENLIVE)  237 mL Oral BID BM  . hydrochlorothiazide  12.5 mg Oral Daily  . lisinopril  20 mg Oral Daily  . metoprolol succinate  50 mg Oral Daily  . multivitamin with minerals  1 tablet Oral Daily  . potassium chloride  10 mEq Oral Daily   Continuous Infusions: . sodium chloride 75 mL/hr at 05/12/15 1014   PRN Meds:.acetaminophen **OR** acetaminophen, dicyclomine, fluticasone, HYDROcodone-acetaminophen, menthol-cetylpyridinium **OR** phenol, metoCLOPramide **OR** metoCLOPramide (REGLAN) injection, morphine injection, ondansetron (ZOFRAN) IV, pantoprazole  Antibiotics    Anti-infectives    Start      Dose/Rate Route Frequency Ordered Stop   05/09/15 0030  ceFAZolin (ANCEF) IVPB 2g/100 mL premix     2 g 200 mL/hr over 30 Minutes Intravenous Every 6 hours 05/08/15 2110 05/09/15 0708   05/08/15 1800  ceFAZolin (ANCEF) IVPB 2g/100 mL premix     2 g 200 mL/hr over 30 Minutes Intravenous On call to O.R. 05/08/15 1404 05/08/15 1830      Subjective:   [name] afebrile, no CP or SOB. Still feeling weak.  Patient complains of left leg pain when she moves; but control overall. No other complaints.   Objective:   Filed Vitals:   05/12/15 0928 05/12/15 1216 05/12/15 1300 05/12/15 1539  BP: 145/56 160/65 156/66 156/71  Pulse: 101 105 110 99  Temp:  98.7 F (37.1 C) 98.7 F (37.1 C) 99.3 F (37.4 C)  TempSrc:  Oral Oral Oral  Resp:  16 18 18   Height:      Weight:      SpO2:        Wt Readings from Last 3 Encounters:  05/08/15 43.4 kg (95 lb 10.9 oz)  01/07/15 43.146 kg (95 lb 1.9 oz)  12/30/14 43.455 kg (95 lb 12.8 oz)     Intake/Output Summary (Last 24 hours) at 05/12/15 1925 Last data filed at 05/12/15 1908  Gross per 24 hour  Intake    575 ml  Output    701 ml  Net   -126 ml    Exam  General: Well developed, well nourished, NAD; afebrile, AAOX3  HEENT: NCAT,  mucous membranes moist.   Cardiovascular: S1 S2 auscultated, 1/6 SEM, RRR  Respiratory: Clear to auscultation bilaterally   Abdomen: Soft, nontender, nondistended, + bowel sounds  Extremities: warm dry without cyanosis clubbing or edema. Left hip dressing in place  Neuro: AAOx3, nonfocal  Psych: Appropriate mood and affect, pleasant  Data Review   Micro Results Recent Results (from the past 240 hour(s))  Surgical pcr screen     Status: None   Collection Time: 05/08/15  3:31 PM  Result Value Ref Range Status   MRSA, PCR NEGATIVE NEGATIVE Final   Staphylococcus aureus NEGATIVE NEGATIVE Final    Comment:        The Xpert SA Assay (FDA approved for NASAL specimens in patients over 21 years of  age), is one component of a comprehensive surveillance program.  Test performance has been validated by [facility] for patients greater than or equal to 30 year old. It is not intended to diagnose infection nor to guide or monitor treatment.     Radiology Reports Dg Chest 1 View  05/08/2015  CLINICAL DATA:  Preop for left hip fracture. EXAM: CHEST 1 VIEW COMPARISON:  12/31/2005 FINDINGS: Normal heart size and mediastinal contours. No acute infiltrate or edema. No effusion or pneumothorax. L1 superior endplate fracture which is chronic based on 2016 lumbar spine MRI. Remote lateral right seventh rib fracture. No acute osseous findings. IMPRESSION: No evidence of  active disease. Electronically Signed   By: Monte Fantasia M.D.   On: 05/08/2015 09:41   Pelvis Portable  05/08/2015  CLINICAL DATA:  ORIF of the left hip. EXAM: PORTABLE PELVIS 1-2 VIEWS COMPARISON:  Films earlier today. FINDINGS: Postoperative film shows improved alignment at the level of intratrochanteric fracture with intramedullary nail placement. Old fractures of the left pubic rami present. IMPRESSION: Improved alignment at the level of the left hip intertrochanteric fracture after ORIF. Electronically Signed   By: Aletta Edouard M.D.   On: 05/08/2015 20:53   Dg Knee Left Port  05/08/2015  CLINICAL DATA:  Intertrochanteric left femur fracture. Initial encounter. EXAM: PORTABLE LEFT KNEE - 1-2 VIEW COMPARISON:  None. FINDINGS: Limited patient positioning due to ipsilateral hip fracture. No evidence of fracture or subluxation at the knee. No visualized joint effusion but limited by obliquity. Osteopenia. IMPRESSION: Limited study with no evidence of fracture at the knee. Electronically Signed   By: Monte Fantasia M.D.   On: 05/08/2015 12:40   Dg C-arm 1-60 Min-no Report  05/08/2015  CLINICAL DATA: surgery C-ARM 1-60 MINUTES Fluoroscopy was utilized by the requesting physician.  No radiographic interpretation.   Dg Hip Unilat With  Pelvis 2-3 Views Left  05/08/2015  CLINICAL DATA:  Fall with left hip pain.  Initial encounter. EXAM: DG HIP (WITH OR WITHOUT PELVIS) 2-3V LEFT COMPARISON:  None. FINDINGS: Acute intertrochanteric left femur fracture with varus angulation. Remote left obturator ring fracture with healed distortion. No evidence of acute pelvic fracture. Located hips. IMPRESSION: 1. Angulated intertrochanteric left femur fracture. 2. Remote left obturator ring fractures. Electronically Signed   By: Monte Fantasia M.D.   On: 05/08/2015 09:35   Dg Femur Min 2 Views Left  05/08/2015  CLINICAL DATA:  Left intra medullary nail EXAM: LEFT FEMUR 2 VIEWS COMPARISON:  05/08/2015 FINDINGS: Improved positioning and alignment intertrochanteric left femur fracture. Intra medullary nail across fracture identified. Radiation Exposure Index (as provided by the fluoroscopic device): 5.39 mGy Number of Acquired Images:  5 IMPRESSION: postoperative change Electronically Signed   By: Skipper Cliche M.D.   On: 05/08/2015 20:06   Dg Femur Port Min 2 Views Left  05/08/2015  CLINICAL DATA:  Status post ORIF of basilar intratrochanteric left hip fracture. EXAM: LEFT FEMUR PORTABLE 2 VIEWS COMPARISON:  Films earlier today. FINDINGS: Alignment of the proximal left femur appears improved after placement of an intramedullary nail. There is some mild displacement remaining at the level of the intertrochanteric fracture. IMPRESSION: Improved alignment following ORIF of the left femur. Electronically Signed   By: Aletta Edouard M.D.   On: 05/08/2015 21:03    CBC  Recent Labs Lab 05/08/15 1054 05/09/15 0355 05/10/15 0547 05/11/15 0522 05/12/15 0509  WBC 14.0* 27.1* 16.5* 16.7* 15.1*  HGB 13.5 10.4* 7.9* 9.0* 8.0*  HCT 43.5 34.2* 25.8* 28.5* 25.1*  PLT 505* 481* 490* 464* 534*  MCV 64.9* 66.9* 66.7* 68.7* 69.1*  MCH 20.1* 20.4* 20.4* 21.7* 22.0*  MCHC 31.0 30.4 30.6 31.6 31.9  RDW 18.1* 18.1* 18.5* 19.6* 20.3*  LYMPHSABS 1.0  --   --   --    --   MONOABS 0.7  --   --   --   --   EOSABS 0.3  --   --   --   --   BASOSABS 0.0  --   --   --   --     Chemistries   Recent Labs Lab 05/08/15 1054 05/09/15 0355 05/10/15 0547  05/12/15 0509  NA 134* 135 131* 132*  K 4.2 4.5 4.0 4.0  CL 100* 101 102 103  CO2 24 26 24 23   GLUCOSE 104* 165* 135* 103*  BUN 22* 22* 24* 12  CREATININE 1.01* 1.26* 1.04* 0.81  CALCIUM 9.7 8.5* 8.2* 8.1*  MG  --   --  1.9  --    ------------------------------------------------------------------------------------------------------------------ estimated creatinine clearance is 34.2 mL/min (by C-G formula based on Cr of 0.81). ------------------------------------------------------------------------------------------------------------------ No results for input(s): HGBA1C in the last 72 hours. ------------------------------------------------------------------------------------------------------------------ No results for input(s): CHOL, HDL, LDLCALC, TRIG, CHOLHDL, LDLDIRECT in the last 72 hours. ------------------------------------------------------------------------------------------------------------------ No results for input(s): TSH, T4TOTAL, T3FREE, THYROIDAB in the last 72 hours.  Invalid input(s): FREET3 ------------------------------------------------------------------------------------------------------------------  Recent Labs  05/10/15 1013  VITAMINB12 1067*  FOLATE 21.7  FERRITIN 10*  TIBC 259  IRON 6*  RETICCTPCT 2.6   Cardiac Enzymes No results for input(s): CKMB, TROPONINI, MYOGLOBIN in the last 168 hours.  Invalid input(s): CK ------------------------------------------------------------------------------------------------------------------ Invalid input(s): POCBNP    Barton Dubois MD on 05/12/2015 at 7:25 PM [phone]

## 2015-05-13 DIAGNOSIS — K2940 Chronic atrophic gastritis without bleeding: Secondary | ICD-10-CM | POA: Diagnosis not present

## 2015-05-13 DIAGNOSIS — K589 Irritable bowel syndrome without diarrhea: Secondary | ICD-10-CM | POA: Diagnosis not present

## 2015-05-13 DIAGNOSIS — T148 Other injury of unspecified body region: Secondary | ICD-10-CM | POA: Diagnosis not present

## 2015-05-13 DIAGNOSIS — S72142A Displaced intertrochanteric fracture of left femur, initial encounter for closed fracture: Secondary | ICD-10-CM | POA: Diagnosis not present

## 2015-05-13 DIAGNOSIS — D72829 Elevated white blood cell count, unspecified: Secondary | ICD-10-CM | POA: Diagnosis not present

## 2015-05-13 DIAGNOSIS — Z4789 Encounter for other orthopedic aftercare: Secondary | ICD-10-CM | POA: Diagnosis not present

## 2015-05-13 DIAGNOSIS — D75839 Thrombocytosis, unspecified: Secondary | ICD-10-CM | POA: Insufficient documentation

## 2015-05-13 DIAGNOSIS — M79605 Pain in left leg: Secondary | ICD-10-CM | POA: Diagnosis not present

## 2015-05-13 DIAGNOSIS — S72142D Displaced intertrochanteric fracture of left femur, subsequent encounter for closed fracture with routine healing: Secondary | ICD-10-CM | POA: Diagnosis not present

## 2015-05-13 DIAGNOSIS — D45 Polycythemia vera: Secondary | ICD-10-CM | POA: Diagnosis not present

## 2015-05-13 DIAGNOSIS — S72102D Unspecified trochanteric fracture of left femur, subsequent encounter for closed fracture with routine healing: Secondary | ICD-10-CM | POA: Diagnosis not present

## 2015-05-13 DIAGNOSIS — D62 Acute posthemorrhagic anemia: Secondary | ICD-10-CM | POA: Diagnosis not present

## 2015-05-13 DIAGNOSIS — E46 Unspecified protein-calorie malnutrition: Secondary | ICD-10-CM | POA: Diagnosis not present

## 2015-05-13 DIAGNOSIS — M6281 Muscle weakness (generalized): Secondary | ICD-10-CM | POA: Diagnosis not present

## 2015-05-13 DIAGNOSIS — I341 Nonrheumatic mitral (valve) prolapse: Secondary | ICD-10-CM | POA: Diagnosis not present

## 2015-05-13 DIAGNOSIS — J309 Allergic rhinitis, unspecified: Secondary | ICD-10-CM | POA: Diagnosis not present

## 2015-05-13 DIAGNOSIS — S728X9A Other fracture of unspecified femur, initial encounter for closed fracture: Secondary | ICD-10-CM | POA: Diagnosis not present

## 2015-05-13 DIAGNOSIS — R2681 Unsteadiness on feet: Secondary | ICD-10-CM | POA: Diagnosis not present

## 2015-05-13 DIAGNOSIS — I10 Essential (primary) hypertension: Secondary | ICD-10-CM | POA: Diagnosis not present

## 2015-05-13 DIAGNOSIS — D473 Essential (hemorrhagic) thrombocythemia: Secondary | ICD-10-CM | POA: Insufficient documentation

## 2015-05-13 LAB — CBC
HCT: 30.6 % — ABNORMAL LOW (ref 36.0–46.0)
HEMOGLOBIN: 9.9 g/dL — AB (ref 12.0–15.0)
MCH: 22.1 pg — AB (ref 26.0–34.0)
MCHC: 32.4 g/dL (ref 30.0–36.0)
MCV: 68.5 fL — AB (ref 78.0–100.0)
Platelets: 544 10*3/uL — ABNORMAL HIGH (ref 150–400)
RBC: 4.47 MIL/uL (ref 3.87–5.11)
RDW: 20.7 % — ABNORMAL HIGH (ref 11.5–15.5)
WBC: 17.6 10*3/uL — ABNORMAL HIGH (ref 4.0–10.5)

## 2015-05-13 LAB — CBC AND DIFFERENTIAL: WBC: 17.6 10*3/mL

## 2015-05-13 LAB — TYPE AND SCREEN
ABO/RH(D): B POS
Antibody Screen: NEGATIVE
Unit division: 0

## 2015-05-13 MED ORDER — ENSURE ENLIVE PO LIQD: 237.0000 mL | Freq: Two times a day (BID) | ORAL | Status: DC

## 2015-05-13 MED ORDER — LOPERAMIDE HCL 1 MG/5ML PO LIQD: 1.0000 mg | ORAL | Status: DC | PRN

## 2015-05-13 MED ORDER — ENOXAPARIN SODIUM 30 MG/0.3ML ~~LOC~~ SOLN: 30.0000 mg | SUBCUTANEOUS | Status: DC

## 2015-05-13 MED ORDER — HYDROCODONE-ACETAMINOPHEN 5-325 MG PO TABS: 1.0000 | ORAL_TABLET | Freq: Four times a day (QID) | ORAL | Status: DC | PRN

## 2015-05-13 MED ORDER — POLYSACCHARIDE IRON COMPLEX 150 MG PO CAPS: 150.0000 mg | ORAL_CAPSULE | Freq: Every day | ORAL | Status: DC

## 2015-05-13 NOTE — Clinical Social Work Placement (Signed)
Patient is set to discharge to [facility] today. Patient & daughter, [name] at bedside aware. Discharge packet given to RN, Robert Bellow. [facility] called for transport.     Raynaldo Opitz, [facility] Clinical Social Worker cell #: [phone]    CLINICAL SOCIAL WORK PLACEMENT  NOTE  Date:  05/13/2015  Patient Details  Name: [name] MRN: [id number] Date of Birth: Jun 30, 1926  Clinical Social Work is seeking post-discharge placement for this patient at the [place] level of care (*CSW will initial, date and re-position this form in  chart as items are completed):  Yes   Patient/family provided with [facility]'s list of facilities offering this level of care within the geographic area requested by the patient (or if unable, by the patient's family).  Yes   Patient/family informed of their freedom to choose among providers that offer the needed level of care, that participate in Medicare, Medicaid or managed care program needed by the patient, have an available bed and are willing to accept the patient.  Yes   Patient/family informed of [facility]'s ownership interest in [facility] and [facility], as well as of the fact that they are under no obligation to receive care at these facilities.  PASRR submitted to EDS on 05/09/15     PASRR number received on 05/09/15     Existing PASRR number confirmed on       FL2 transmitted to all facilities in geographic area requested by pt/family on 05/09/15     FL2 transmitted to all facilities within larger geographic area on       Patient informed that his/her managed care company has contracts with or will negotiate with certain facilities, including the following:        Yes   Patient/family informed of bed offers received.  Patient chooses bed at [facility]     Physician recommends and patient chooses bed at      Patient to be transferred to [facility] on 05/13/15.  Patient to be transferred to facility by [facility]     Patient family notified on 05/13/15 of transfer.  Name of family member notified:  patient's daughter, [name] at bedside     PHYSICIAN       Additional Comment:    _______________________________________________ Standley Brooking, LCSW 05/13/2015, 3:07 PM

## 2015-05-13 NOTE — Discharge Summary (Signed)
Physician Discharge Summary  [name] [id number] DOB: 04-18-1926 DOA: 05/08/2015  PCP: Kelly Pel, MD  Admit date: 05/08/2015 Discharge date: 05/13/2015  Time spent: 35 minutes  Recommendations for Outpatient Follow-up:  Repeat CBC in 5 days to follow Hgb trend Repeat BMET in 5 days to follow electrolytes and renal function Weight bearing as tolerated Follow up with orthopedic surgery in 2 weeks  Discharge Diagnoses:  Active Problems:   Polycythemia vera (HCC)   HTN (hypertension)   MVP (mitral valve prolapse)   Closed left hip fracture (HCC)   Leukocytosis   Fracture, intertrochanteric, left femur ([place])   Faintness   Discharge Condition: stable and improved. Will discharge to [facility] for rehabilitation and conditioning.  Diet recommendation: heart healthy diet  Filed Weights   05/08/15 1700  Weight: 43.4 kg (95 lb 10.9 oz)    History of present illness:  As per H&P written by Dr. Ree Rojas on 05/08/15 82 y.o. female With a history of hypertension, mitral valve prolapse, polycythemia, that presented to the emergency department after sustaining a fall and having left leg pain. Patient states her leg simply "gave out" after going down one stair. She denies any dizziness prior to the episode. Patient does not use a walker or cane for ambulation. She does live alone. Currently patient denies any chest pain, shortness breath, abdominal pain, dizziness or headache. She does complain of pain left hip which radiates to her left thigh. In the emergency department, x-rays did show intertrochanteric fracture of the left femur. TRH called for admission.  Hospital Course:  Syncope/Orthostatic Hypotension -Patient was working with physical therapy on 05/09/2015, while going from a sitting to standing position, patient became near syncopal twice. -though to be secondary to symptomatic anemia and antihypertensive drugs -Continue telemetry monitoring -Echocardiogram: EF 60-65%,  Grade 1 diastolic dysfunction -Cardiology consulted and no further rec's at this point  Left intertrochanteric femur fracture -Status post fall and with X-ray showed angulated intertrochanteric left femur fracture -Orthopedics, [name], consulted and appreciate rec's and assistance.  -S/p left intramedullary fixation of left pertrochanteric femur fracture -Recommended lovenox for 25 days -Continue pain control -PT and OT consulted, recommended SNF -weight bearing as tolerated   Acute blood loss anemia -s/p 2 unit of PRBC; Hgb 9.9 at discharge -CBC in 5 days to follow Hgb trend -Anemia panel: Iron 6, ferritin 10 -received IV iron during this admission and will discharge on niferex  Leukocytosis -Likely reactive -Patient currently afebrile and Denying any urinary symptoms, chest x-ray unremarkable -Continue to monitor CBC intermittently  Essential hypertension/Mitral valve prolpase -BP stable -Continue amlodipine, lisinopril, HCTZ, metoprolol  GERD -Continue PPI  History of breast cancer -Stable  Thombocytosis  -Stable, continue agrylin  Procedures: left intramedullary fixation of left pertrochanteric femur fracture  Consultations:  Orthopedic surgery   Cardiology   Discharge Exam: Filed Vitals:   05/12/15 2048 05/13/15 0532  BP: 155/71 157/72  Pulse: 107 99  Temp: 98.9 F (37.2 C) 98.2 F (36.8 C)  Resp: 18 12    General: Well developed, well nourished, NAD; afebrile, AAOX3  HEENT: NCAT, mucous membranes moist.   Cardiovascular: S1 S2 auscultated, 1/6 SEM, RRR  Respiratory: Clear to auscultation bilaterally  Abdomen: Soft, nontender, nondistended, + bowel sounds  Extremities: warm dry without cyanosis clubbing or edema. Left hip dressing in place  Neuro: AAOx3, nonfocal  Psych: Appropriate mood and affect, pleasant  Discharge Instructions   Discharge Instructions    Diet - low sodium heart healthy  Complete by:  As directed       Discharge instructions    Complete by:  As directed   Take medications as prescribed Repeat CBC in 5 days to follow Hgb trend Repeat BMET in 5 days to follow electrolytes and renal function Weight bearing as tolerated Follow up with orthopedic surgery in 2 weeks Physical rehabilitation as per SNF protocol     Increase activity slowly    Complete by:  As directed           Current Discharge Medication List    START taking these medications   Details  enoxaparin (LOVENOX) 30 MG/0.3ML injection Inject 0.3 mLs (30 mg total) into the skin daily. Treatment for 25 days as part of DVT prophylaxis after hip surgery Qty: 0 Syringe    feeding supplement, ENSURE ENLIVE, (ENSURE ENLIVE) LIQD Take 237 mLs by mouth 2 (two) times daily between meals.    HYDROcodone-acetaminophen (NORCO/VICODIN) 5-325 MG tablet Take 1-2 tablets by mouth every 6 (six) hours as needed for moderate pain. Qty: 30 tablet, Refills: 0    iron polysaccharides (NIFEREX) 150 MG capsule Take 1 capsule (150 mg total) by mouth daily.      CONTINUE these medications which have CHANGED   Details  loperamide (IMODIUM) 1 MG/5ML solution Take 5 mLs (1 mg total) by mouth as needed for diarrhea or loose stools.      CONTINUE these medications which have NOT CHANGED   Details  amLODipine (NORVASC) 2.5 MG tablet Take one-half tablet by mouth daily Qty: 15 tablet, Refills: 11    anagrelide (AGRYLIN) 0.5 MG capsule TAKE 2 CAPSULES IN THE MORNING AND 1 CAPSULE IN THE AFTERNOON. Qty: 90 capsule, Refills: 0    aspirin 81 MG tablet Take 81 mg by mouth daily.      Calcium Carbonate-Vitamin D (CALCIUM 500/D PO) Take 1 capsule by mouth daily.    Cetirizine HCl (ZYRTEC PO) Take 1 tablet by mouth as needed (AS NEEDED FOR ALLERGIES).     Cholecalciferol (VITAMIN D3) 1000 UNITS CAPS Take 2,000 mg by mouth 2 (two) times daily.     dicyclomine (BENTYL) 10 MG capsule Take 1 capsule (10 mg total) by mouth 4 (four) times daily as needed for  spasms. Qty: 120 capsule, Refills: 1    fluticasone (FLONASE) 50 MCG/ACT nasal spray Place 2 sprays into the nose as needed for allergies or rhinitis.     hydrochlorothiazide (MICROZIDE) 12.5 MG capsule Take 1 capsule (12.5 mg total) by mouth daily. Qty: 30 capsule, Refills: 11    lisinopril (PRINIVIL,ZESTRIL) 20 MG tablet TAKE 1 TABLET EACH DAY. Qty: 30 tablet, Refills: 11    metoprolol succinate (TOPROL-XL) 50 MG 24 hr tablet Take 1 tablet (50 mg total) by mouth daily. Qty: 90 tablet, Refills: 2    Multiple Vitamin (MULTIVITAMIN) tablet Take 1 tablet by mouth daily.      pantoprazole (PROTONIX) 40 MG tablet Take 40 mg by mouth as needed (AS NEEDED FOR STOMACH).     potassium chloride (K-DUR) 10 MEQ tablet Take 1 tablet (10 mEq total) by mouth daily. Qty: 30 tablet, Refills: 11    Probiotic Product (ULTRAFLORA IMMUNE HEALTH PO) Take 1 tablet by mouth daily.    colestipol (COLESTID) 1 G tablet Take 1 tablet (1 g total) by mouth daily. Qty: 30 tablet, Refills: 5      STOP taking these medications     cyclobenzaprine (FLEXERIL) 5 MG tablet        Allergies  Allergen Reactions  . Sulfur Other (See Comments)    Pt doesn't remember reaction.   Follow-up Information    Follow up with Swinteck, Horald Pollen, MD. Schedule an appointment as soon as possible for a visit in 2 weeks.   Specialty:  Orthopedic Surgery   Why:  For wound re-check   Contact information:   [address] [address] [phone]       Follow up with Kelly Pel, MD.   Specialty:  Internal Medicine   Why:  follow up in 10 days after discharge from SNF   Contact information:   [address] [place] [address] [phone]       The results of significant diagnostics from this hospitalization (including imaging, microbiology, ancillary and laboratory) are listed below for reference.    Significant Diagnostic Studies: Dg Chest 1  View  05/08/2015  CLINICAL DATA:  Preop for left hip fracture. EXAM: CHEST 1 VIEW COMPARISON:  12/31/2005 FINDINGS: Normal heart size and mediastinal contours. No acute infiltrate or edema. No effusion or pneumothorax. L1 superior endplate fracture which is chronic based on 2016 lumbar spine MRI. Remote lateral right seventh rib fracture. No acute osseous findings. IMPRESSION: No evidence of active disease. Electronically Signed   By: Monte Fantasia M.D.   On: 05/08/2015 09:41   Pelvis Portable  05/08/2015  CLINICAL DATA:  ORIF of the left hip. EXAM: PORTABLE PELVIS 1-2 VIEWS COMPARISON:  Films earlier today. FINDINGS: Postoperative film shows improved alignment at the level of intratrochanteric fracture with intramedullary nail placement. Old fractures of the left pubic rami present. IMPRESSION: Improved alignment at the level of the left hip intertrochanteric fracture after ORIF. Electronically Signed   By: Aletta Edouard M.D.   On: 05/08/2015 20:53   Dg Knee Left Port  05/08/2015  CLINICAL DATA:  Intertrochanteric left femur fracture. Initial encounter. EXAM: PORTABLE LEFT KNEE - 1-2 VIEW COMPARISON:  None. FINDINGS: Limited patient positioning due to ipsilateral hip fracture. No evidence of fracture or subluxation at the knee. No visualized joint effusion but limited by obliquity. Osteopenia. IMPRESSION: Limited study with no evidence of fracture at the knee. Electronically Signed   By: Monte Fantasia M.D.   On: 05/08/2015 12:40   Dg C-arm 1-60 Min-no Report  05/08/2015  CLINICAL DATA: surgery C-ARM 1-60 MINUTES Fluoroscopy was utilized by the requesting physician.  No radiographic interpretation.   Dg Hip Unilat With Pelvis 2-3 Views Left  05/08/2015  CLINICAL DATA:  Fall with left hip pain.  Initial encounter. EXAM: DG HIP (WITH OR WITHOUT PELVIS) 2-3V LEFT COMPARISON:  None. FINDINGS: Acute intertrochanteric left femur fracture with varus angulation. Remote left obturator ring fracture with healed  distortion. No evidence of acute pelvic fracture. Located hips. IMPRESSION: 1. Angulated intertrochanteric left femur fracture. 2. Remote left obturator ring fractures. Electronically Signed   By: Monte Fantasia M.D.   On: 05/08/2015 09:35   Dg Femur Min 2 Views Left  05/08/2015  CLINICAL DATA:  Left intra medullary nail EXAM: LEFT FEMUR 2 VIEWS COMPARISON:  05/08/2015 FINDINGS: Improved positioning and alignment intertrochanteric left femur fracture. Intra medullary nail across fracture identified. Radiation Exposure Index (as provided by the fluoroscopic device): 5.39 mGy Number of Acquired Images:  5 IMPRESSION: postoperative change Electronically Signed   By: Skipper Cliche M.D.   On: 05/08/2015 20:06   Dg Femur Port Min 2 Views Left  05/08/2015  CLINICAL DATA:  Status post ORIF of basilar intratrochanteric left hip  fracture. EXAM: LEFT FEMUR PORTABLE 2 VIEWS COMPARISON:  Films earlier today. FINDINGS: Alignment of the proximal left femur appears improved after placement of an intramedullary nail. There is some mild displacement remaining at the level of the intertrochanteric fracture. IMPRESSION: Improved alignment following ORIF of the left femur. Electronically Signed   By: Aletta Edouard M.D.   On: 05/08/2015 21:03    Microbiology: Recent Results (from the past 240 hour(s))  Surgical pcr screen     Status: None   Collection Time: 05/08/15  3:31 PM  Result Value Ref Range Status   MRSA, PCR NEGATIVE NEGATIVE Final   Staphylococcus aureus NEGATIVE NEGATIVE Final    Comment:        The Xpert SA Assay (FDA approved for NASAL specimens in patients over 36 years of age), is one component of a comprehensive surveillance program.  Test performance has been validated by [facility] for patients greater than or equal to 58 year old. It is not intended to diagnose infection nor to guide or monitor treatment.      Labs: Basic Metabolic Panel:  Recent Labs Lab 05/08/15 1054  05/09/15 0355 05/10/15 0547 05/12/15 0509  NA 134* 135 131* 132*  K 4.2 4.5 4.0 4.0  CL 100* 101 102 103  CO2 24 26 24 23   GLUCOSE 104* 165* 135* 103*  BUN 22* 22* 24* 12  CREATININE 1.01* 1.26* 1.04* 0.81  CALCIUM 9.7 8.5* 8.2* 8.1*  MG  --   --  1.9  --    CBC:  Recent Labs Lab 05/08/15 1054 05/09/15 0355 05/10/15 0547 05/11/15 0522 05/12/15 0509 05/13/15 0458  WBC 14.0* 27.1* 16.5* 16.7* 15.1* 17.6*  NEUTROABS 12.0*  --   --   --   --   --   HGB 13.5 10.4* 7.9* 9.0* 8.0* 9.9*  HCT 43.5 34.2* 25.8* 28.5* 25.1* 30.6*  MCV 64.9* 66.9* 66.7* 68.7* 69.1* 68.5*  PLT 505* 481* 490* 464* 534* 544*    Signed:  Barton Dubois MD.  [facility] 05/13/2015, 12:56 PM

## 2015-05-14 ENCOUNTER — Encounter: Payer: Self-pay | Admitting: Internal Medicine

## 2015-05-14 ENCOUNTER — Non-Acute Institutional Stay (SKILLED_NURSING_FACILITY): Payer: Medicare Other | Admitting: Internal Medicine

## 2015-05-14 DIAGNOSIS — D62 Acute posthemorrhagic anemia: Secondary | ICD-10-CM | POA: Diagnosis not present

## 2015-05-14 DIAGNOSIS — K589 Irritable bowel syndrome without diarrhea: Secondary | ICD-10-CM | POA: Diagnosis not present

## 2015-05-14 DIAGNOSIS — K2940 Chronic atrophic gastritis without bleeding: Secondary | ICD-10-CM

## 2015-05-14 DIAGNOSIS — S72142D Displaced intertrochanteric fracture of left femur, subsequent encounter for closed fracture with routine healing: Secondary | ICD-10-CM

## 2015-05-14 DIAGNOSIS — J309 Allergic rhinitis, unspecified: Secondary | ICD-10-CM | POA: Diagnosis not present

## 2015-05-14 DIAGNOSIS — R2681 Unsteadiness on feet: Secondary | ICD-10-CM

## 2015-05-14 DIAGNOSIS — I10 Essential (primary) hypertension: Secondary | ICD-10-CM | POA: Diagnosis not present

## 2015-05-14 DIAGNOSIS — D45 Polycythemia vera: Secondary | ICD-10-CM | POA: Diagnosis not present

## 2015-05-14 DIAGNOSIS — E46 Unspecified protein-calorie malnutrition: Secondary | ICD-10-CM | POA: Diagnosis not present

## 2015-05-14 NOTE — Progress Notes (Signed)
LOCATION: [place]  PCP: [name], MD   Code Status: DNR  Goals of care: Advanced Directive information Advanced Directives 05/14/2015  Does patient have an advance directive? No  Type of Advance Directive Out of facility DNR (pink MOST or yellow form)  Does patient want to make changes to advanced directive? No - Patient declined  Copy of advanced directive(s) in chart? Yes       Extended Emergency Contact Information Primary Emergency Contact: Kivitt,Charles Jr.  [place] Phone: [phone] Work Phone: [phone] Mobile Phone: [phone] Relation: Son Secondary Emergency Contact: Ola, [place], [place] Phone: [phone] Relation: Other   Allergies  Allergen Reactions  . Sulfur Other (See Comments)    Pt doesn't remember reaction.    Chief Complaint  Patient presents with  . New Admit To SNF    New Admission     HPI:  Patient is a 82 y.o. female seen today for short term rehabilitation post hospital admission from 05/08/15-05/13/15 post fall with left femur intertrochanteric fracture. She underwent ORIF. She had workup for near syncope episode. Her echocardiogram showed preserved EF. She had acute blood loss anemia and received 2 u prbc transfusion and iv iron. She is seen in her room today.   Review of Systems:  Constitutional: Negative for fever, chills, diaphoresis. Gets tired easily. HENT: Negative for headache, congestion, nasal discharge, hearing loss, sore throat. Positive difficulty swallowing. Chronic dysphagia.   Eyes: Negative for blurred vision, double vision and discharge.  Respiratory: Negative for cough, shortness of breath and wheezing.   Cardiovascular: Negative for chest pain, palpitations, leg swelling.  Gastrointestinal: Negative for heartburn, vomiting, abdominal pain, loss of appetite. Positive for nausea. Last bowel movement was yesterday. History of IBS.  Genitourinary:  Positive for burning with urination. denies hematuria or flank pain. Musculoskeletal: Negative for back pain, fall in the facility.  Skin: Negative for itching, rash.  Neurological: Negative for weakness. Positive for chronic dizziness when changing position. Psychiatric/Behavioral: Negative for depression.    Past Medical History  Diagnosis Date  . MVP (mitral valve prolapse)   . Hypertension   . Chest tightness   . Palpitation   . PVC's (premature ventricular contractions)   . Breast cancer ([place])     right  . Polycythemia   . Chronic gastritis   . Diverticulosis   . Candida esophagitis ([place])   . Arthritis    Past Surgical History  Procedure Laterality Date  . Tonsillectomy    . Appendectomy    . Cardiovascular stress test  12/23/2000    EF 70%  . Transthoracic echocardiogram  03/05/2010    EF 55-60%  . Abdominal hysterectomy      ovaries taken  . Breast lumpectomy      right  . Elbow fracture surgery      left  . Cataract extraction      bilateral  . Hematoma evacuation      right  . Femur im nail Left 05/08/2015    Procedure: INTERTROCHANTERIC FEMORAL NAIL;  Surgeon: Rod Can, MD;  Location: [facility];  Service: Orthopedics;  Laterality: Left;   Social History:   reports that she quit smoking about 34 years ago. She has never used smokeless tobacco. She reports that she drinks alcohol. She reports that she does not use illicit drugs.  Family History  Problem Relation Age of Onset  . Stomach cancer Mother   . Hypertension  Father   . Stroke Father   . Hypertension Sister   . Hypertension Brother   . Prostate cancer Brother   . Colon cancer Neg Hx   . Prostate cancer Father   . Diabetes Brother     Medications:   Medication List       This list is accurate as of: 05/14/15  2:57 PM.  Always use your most recent med list.               amLODipine 2.5 MG tablet  Commonly known as:  NORVASC  Take one-half tablet by mouth daily     anagrelide 0.5 MG  capsule  Commonly known as:  AGRYLIN  TAKE 2 CAPSULES IN THE MORNING AND 1 CAPSULE IN THE AFTERNOON.     aspirin 81 MG tablet  Take 81 mg by mouth daily.     CALCIUM 500/D PO  Take 1 capsule by mouth daily.     colestipol 1 g tablet  Commonly known as:  COLESTID  Take 1 g by mouth daily. Reported on 05/14/2015     dicyclomine 10 MG capsule  Commonly known as:  BENTYL  Take 1 capsule (10 mg total) by mouth 4 (four) times daily as needed for spasms.     enoxaparin 30 MG/0.3ML injection  Commonly known as:  LOVENOX  Inject 0.3 mLs (30 mg total) into the skin daily. Treatment for 25 days as part of DVT prophylaxis after hip surgery     fluticasone 50 MCG/ACT nasal spray  Commonly known as:  FLONASE  Place 2 sprays into the nose as needed for allergies or rhinitis.     hydrochlorothiazide 12.5 MG capsule  Commonly known as:  MICROZIDE  Take 1 capsule (12.5 mg total) by mouth daily.     HYDROcodone-acetaminophen 5-325 MG tablet  Commonly known as:  NORCO/VICODIN  Take 1-2 tablets by mouth every 6 (six) hours as needed for moderate pain.     iron polysaccharides 150 MG capsule  Commonly known as:  NIFEREX  Take 1 capsule (150 mg total) by mouth daily.     lisinopril 20 MG tablet  Commonly known as:  PRINIVIL,ZESTRIL  TAKE 1 TABLET EACH DAY.     loperamide 1 MG/5ML solution  Commonly known as:  IMODIUM  Take 5 mLs (1 mg total) by mouth as needed for diarrhea or loose stools.     metoprolol succinate 50 MG 24 hr tablet  Commonly known as:  TOPROL-XL  Take 1 tablet (50 mg total) by mouth daily.     multivitamin tablet  Take 1 tablet by mouth daily.     pantoprazole 40 MG tablet  Commonly known as:  PROTONIX  Take 40 mg by mouth as needed (AS NEEDED FOR STOMACH).     potassium chloride 10 MEQ tablet  Commonly known as:  K-DUR  Take 1 tablet (10 mEq total) by mouth daily.     ULTRAFLORA IMMUNE HEALTH PO  Take 1 tablet by mouth daily.     UNABLE TO FIND  Med Name:  Med pass 90 mL by mouth 2 times daily     Vitamin D3 1000 units Caps  Take 2,000 mg by mouth 2 (two) times daily.     ZYRTEC PO  Take 1 tablet by mouth as needed (AS NEEDED FOR ALLERGIES).        Immunizations: Immunization History  Administered Date(s) Administered  . Influenza-Unspecified 11/15/2012, 11/30/2013     Physical Exam: Filed Vitals:  05/14/15 1447  BP: 148/78  Pulse: 86  Temp: 98.2 F (36.8 C)  TempSrc: Oral  Resp: 18  Height: 5\' 3"  (1.6 m)  Weight: 95 lb 9.6 oz (43.364 kg)  SpO2: 97%   Body mass index is 16.94 kg/(m^2).   General- elderly female, thin built, frail, in no acute distress Head- normocephalic, atraumatic Nose- no maxillary or frontal sinus tenderness, no nasal discharge Throat- moist mucus membrane  Eyes- PERRLA, EOMI, no pallor, no icterus, no discharge, normal conjunctiva, normal sclera Neck- no cervical lymphadenopathy Cardiovascular- normal s1,s2, no murmur, trace leg edema Respiratory- bilateral clear to auscultation, no wheeze, no rhonchi, no crackles, no use of accessory muscles Abdomen- bowel sounds present, soft, non tender Musculoskeletal- able to move all 4 extremities, limited left leg range of motion Neurological- alert and oriented to person, place and time Skin- warm and dry, left hip 3 surgical incision with mepilex dresssing Psychiatry- normal mood and affect    Labs reviewed: Basic Metabolic Panel:  Recent Labs  05/09/15 0355 05/10/15 0547 05/12/15 05/12/15 0509  NA 135 131* 132* 132*  K 4.5 4.0  --  4.0  CL 101 102  --  103  CO2 26 24  --  23  GLUCOSE 165* 135*  --  103*  BUN 22* 24* 12 12  CREATININE 1.26* 1.04* 0.8 0.81  CALCIUM 8.5* 8.2*  --  8.1*  MG  --  1.9  --   --    CBC:  Recent Labs  03/03/15 0947 04/29/15 0951 05/08/15 1054  05/11/15 0522 05/12/15 0509 05/13/15 05/13/15 0458  WBC 12.6* 12.5* 14.0*  < > 16.7* 15.1* 17.6 17.6*  NEUTROABS 10.9* 10.6* 12.0*  --   --   --   --   --     HGB 14.0 13.3 13.5  < > 9.0* 8.0*  --  9.9*  HCT 45.3 44.4 43.5  < > 28.5* 25.1*  --  30.6*  MCV 67.1* 65.8* 64.9*  < > 68.7* 69.1*  --  68.5*  PLT 457* 471* 505*  < > 464* 534*  --  544*  < > = values in this interval not displayed.   Radiological Exams: Dg Chest 1 View  05/08/2015  CLINICAL DATA:  Preop for left hip fracture. EXAM: CHEST 1 VIEW COMPARISON:  12/31/2005 FINDINGS: Normal heart size and mediastinal contours. No acute infiltrate or edema. No effusion or pneumothorax. L1 superior endplate fracture which is chronic based on 2016 lumbar spine MRI. Remote lateral right seventh rib fracture. No acute osseous findings. IMPRESSION: No evidence of active disease. Electronically Signed   By: Monte Fantasia M.D.   On: 05/08/2015 09:41   Pelvis Portable  05/08/2015  CLINICAL DATA:  ORIF of the left hip. EXAM: PORTABLE PELVIS 1-2 VIEWS COMPARISON:  Films earlier today. FINDINGS: Postoperative film shows improved alignment at the level of intratrochanteric fracture with intramedullary nail placement. Old fractures of the left pubic rami present. IMPRESSION: Improved alignment at the level of the left hip intertrochanteric fracture after ORIF. Electronically Signed   By: Aletta Edouard M.D.   On: 05/08/2015 20:53   Dg Knee Left Port  05/08/2015  CLINICAL DATA:  Intertrochanteric left femur fracture. Initial encounter. EXAM: PORTABLE LEFT KNEE - 1-2 VIEW COMPARISON:  None. FINDINGS: Limited patient positioning due to ipsilateral hip fracture. No evidence of fracture or subluxation at the knee. No visualized joint effusion but limited by obliquity. Osteopenia. IMPRESSION: Limited study with no evidence of fracture at the knee.  Electronically Signed   By: Monte Fantasia M.D.   On: 05/08/2015 12:40   Dg C-arm 1-60 Min-no Report  05/08/2015  CLINICAL DATA: surgery C-ARM 1-60 MINUTES Fluoroscopy was utilized by the requesting physician.  No radiographic interpretation.   Dg Hip Unilat With Pelvis 2-3  Views Left  05/08/2015  CLINICAL DATA:  Fall with left hip pain.  Initial encounter. EXAM: DG HIP (WITH OR WITHOUT PELVIS) 2-3V LEFT COMPARISON:  None. FINDINGS: Acute intertrochanteric left femur fracture with varus angulation. Remote left obturator ring fracture with healed distortion. No evidence of acute pelvic fracture. Located hips. IMPRESSION: 1. Angulated intertrochanteric left femur fracture. 2. Remote left obturator ring fractures. Electronically Signed   By: Monte Fantasia M.D.   On: 05/08/2015 09:35   Dg Femur Min 2 Views Left  05/08/2015  CLINICAL DATA:  Left intra medullary nail EXAM: LEFT FEMUR 2 VIEWS COMPARISON:  05/08/2015 FINDINGS: Improved positioning and alignment intertrochanteric left femur fracture. Intra medullary nail across fracture identified. Radiation Exposure Index (as provided by the fluoroscopic device): 5.39 mGy Number of Acquired Images:  5 IMPRESSION: postoperative change Electronically Signed   By: Skipper Cliche M.D.   On: 05/08/2015 20:06   Dg Femur Port Min 2 Views Left  05/08/2015  CLINICAL DATA:  Status post ORIF of basilar intratrochanteric left hip fracture. EXAM: LEFT FEMUR PORTABLE 2 VIEWS COMPARISON:  Films earlier today. FINDINGS: Alignment of the proximal left femur appears improved after placement of an intramedullary nail. There is some mild displacement remaining at the level of the intertrochanteric fracture. IMPRESSION: Improved alignment following ORIF of the left femur. Electronically Signed   By: Aletta Edouard M.D.   On: 05/08/2015 21:03    Assessment/Plan  Unsteady gait With recent left femur fracture. Will have patient work with PT/OT as tolerated to regain strength and restore function.  Fall precautions are in place.  Left femur fracture S/p ORIF. Has f/u with orthopedics. TDWB to LLE. Continue norco 5-325 mg 1-2 tab q6h prn pain. Continue lovenox for dvt prophylaxis. Will have her work with physical therapy and occupational therapy team  to help with gait training and muscle strengthening exercises.fall precautions. Skin care. Encourage to be out of bed. Add ted hose  Blood loss anemia Post op, s/p 2 u prbc transfusion and iv iron. Check cbc. Continue iferex  Protein calorie malnutrition Continue medpass supplement, get dietary consult  HTN Elevated BP but has orthostasis on exam with complaint of dizziness. Discontinue her amlodipine and HCTZ for now and increase lisinopril to 40 mg daily and monitor. Check orthostasis daily x 1 week and bp/HR q shift x 1 week and reassess. Monitor bmp. Continue metoprolol 50 mg daily  gerd Stable, continue protonix 40 mg daily as needed  Polycythemia Continue agrylin 0.5 mg 2 capsule in am and 1 at pm  Allergic rhinitis Stable, continue loratadine and flonase  IBS Continue loperamide as needed and bentyl as needed    Goals of care: short term rehabilitation   Labs/tests ordered: cbc, cmp 05/15/15  Family/ staff Communication: reviewed care plan with patient and nursing supervisor    Blanchie Serve, MD Internal Medicine [place], [place] [address] Cell Phone (Monday-Friday 8 am - 5 pm): [phone] On Call: [phone] and follow prompts after 5 pm and on weekends Office Phone: [phone] Office Fax: [phone]

## 2015-05-15 LAB — HEPATIC FUNCTION PANEL
ALT: 38 U/L — AB (ref 7–35)
AST: 40 U/L — AB (ref 13–35)
Alkaline Phosphatase: 80 U/L (ref 25–125)
Bilirubin, Total: 1 mg/dL

## 2015-05-15 LAB — CBC AND DIFFERENTIAL
HCT: 34 % — AB (ref 36–46)
Hemoglobin: 10.2 g/dL — AB (ref 12.0–16.0)
Neutrophils Absolute: 15 /uL
Platelets: 565 10*3/uL — AB (ref 150–399)
WBC: 17.5 10*3/mL

## 2015-05-15 LAB — BASIC METABOLIC PANEL
BUN: 18 mg/dL (ref 4–21)
CREATININE: 0.8 mg/dL (ref 0.5–1.1)
GLUCOSE: 96 mg/dL
POTASSIUM: 4.4 mmol/L (ref 3.4–5.3)
Sodium: 131 mmol/L — AB (ref 137–147)

## 2015-05-26 DIAGNOSIS — S72102D Unspecified trochanteric fracture of left femur, subsequent encounter for closed fracture with routine healing: Secondary | ICD-10-CM | POA: Diagnosis not present

## 2015-06-06 ENCOUNTER — Non-Acute Institutional Stay (SKILLED_NURSING_FACILITY): Payer: Medicare Other | Admitting: Adult Health

## 2015-06-06 ENCOUNTER — Encounter: Payer: Self-pay | Admitting: Adult Health

## 2015-06-06 DIAGNOSIS — E46 Unspecified protein-calorie malnutrition: Secondary | ICD-10-CM | POA: Diagnosis not present

## 2015-06-06 DIAGNOSIS — K589 Irritable bowel syndrome without diarrhea: Secondary | ICD-10-CM | POA: Diagnosis not present

## 2015-06-06 DIAGNOSIS — I10 Essential (primary) hypertension: Secondary | ICD-10-CM | POA: Diagnosis not present

## 2015-06-06 DIAGNOSIS — S72142D Displaced intertrochanteric fracture of left femur, subsequent encounter for closed fracture with routine healing: Secondary | ICD-10-CM | POA: Diagnosis not present

## 2015-06-06 DIAGNOSIS — D62 Acute posthemorrhagic anemia: Secondary | ICD-10-CM

## 2015-06-06 DIAGNOSIS — D45 Polycythemia vera: Secondary | ICD-10-CM

## 2015-06-06 DIAGNOSIS — J309 Allergic rhinitis, unspecified: Secondary | ICD-10-CM

## 2015-06-06 DIAGNOSIS — K2940 Chronic atrophic gastritis without bleeding: Secondary | ICD-10-CM

## 2015-06-06 NOTE — Progress Notes (Signed)
Patient ID: JEFFERY BRUFF, female   DOB: [birth year], 82 y.o.   MRN: [id number]    DATE:  06/06/2015   MRN:  [id number]  BIRTHDAY: 1926/08/24  Facility:  Nursing Home Location:  [place] Room Number: 1206-P  LEVEL OF CARE:  SNF (31)  Contact Information    Name Relation Home Work Mobile   [place]. Son [phone]   [place], [place] Other [phone]         Code Status History    Date Active Date Inactive Code Status Order ID Comments User Context   05/08/2015  2:04 PM 05/13/2015  5:43 PM DNR [id number]  Cristal Ford, DO Inpatient    Questions for Most Recent Historical Code Status (Order [id number])    Question Answer Comment   In the event of cardiac or respiratory ARREST Do not call a "code blue"    In the event of cardiac or respiratory ARREST Do not perform Intubation, CPR, defibrillation or ACLS    In the event of cardiac or respiratory ARREST Use medication by any route, position, wound care, and other measures to relive pain and suffering. May use oxygen, suction and manual treatment of airway obstruction as needed for comfort.        Chief Complaint  Patient presents with  . Discharge Note    HISTORY OF PRESENT ILLNESS:  This is an 82 year old female who is for discharge home with Home health PT for endurance, OT for ADLs, CNA for showers and Nurse for medication management. DME:  Standard wheelchair with elevating leg rests, anti-tippers, removable arm rest and cushion and 16" X 16" 3-in-1 commode.  She has been admitted to [facility] on 05/13/15 from [facility]. She has PMH MVP, Chronic gastritis, Polycythemia and right breast cancer. She had a fall and sustained a left femur intertrochanteric fracture for which she had ORIF. She had workup for near syncope episode. Her echocardiogram showed preserved EF. She had acute blood loss anemia and received 2 u prbc transfusion and IV  iron.  Patient was admitted to this facility for short-term rehabilitation after the patient's recent hospitalization.  Patient has completed SNF rehabilitation and therapy has cleared the patient for discharge.   PAST MEDICAL HISTORY:  Past Medical History  Diagnosis Date  . MVP (mitral valve prolapse)   . Hypertension   . Chest tightness   . Palpitation   . PVC's (premature ventricular contractions)   . Breast cancer ([place])     right  . Polycythemia   . Chronic gastritis   . Diverticulosis   . Candida esophagitis ([place])   . Arthritis   . Unsteady gait   . Closed intertrochanteric fracture of left femur with routine healing   . Acute blood loss anemia   . IBS (irritable bowel syndrome)   . Rhinitis   . Atrophic gastritis      CURRENT MEDICATIONS: Reviewed  Patient's Medications  New Prescriptions   No medications on file  Previous Medications   ANAGRELIDE (AGRYLIN) 0.5 MG CAPSULE    TAKE 2 CAPSULES IN THE MORNING AND 1 CAPSULE IN THE AFTERNOON.   ASPIRIN 81 MG TABLET    Take 81 mg by mouth daily.     BISACODYL (DULCOLAX) 10 MG SUPPOSITORY    Place 10 mg rectally daily as needed for moderate constipation. For 2 days   CALCIUM CARBONATE-VITAMIN D (CALCIUM 500/D PO)    Take 1 capsule by mouth daily.  CETIRIZINE HCL (ZYRTEC PO)    Take 1 tablet by mouth as needed (AS NEEDED FOR ALLERGIES).    CHOLECALCIFEROL (VITAMIN D3) 1000 UNITS CAPS    Take 2,000 mg by mouth 2 (two) times daily.    COLESTIPOL (COLESTID) 1 G TABLET    Take 1 g by mouth daily. Reported on 05/14/2015   DICYCLOMINE (BENTYL) 10 MG CAPSULE    Take 1 capsule (10 mg total) by mouth 4 (four) times daily as needed for spasms.   ENOXAPARIN (LOVENOX) 30 MG/0.3ML INJECTION    Inject 0.3 mLs (30 mg total) into the skin daily. Treatment for 25 days as part of DVT prophylaxis after hip surgery   FLUTICASONE (FLONASE) 50 MCG/ACT NASAL SPRAY    Place 2 sprays into the nose as needed for allergies or rhinitis.     HYDROCODONE-ACETAMINOPHEN (NORCO/VICODIN) 5-325 MG TABLET    Take 1-2 tablets by mouth every 6 (six) hours as needed for moderate pain.   IRON POLYSACCHARIDES (NIFEREX) 150 MG CAPSULE    Take 1 capsule (150 mg total) by mouth daily.   LISINOPRIL (PRINIVIL,ZESTRIL) 40 MG TABLET    Take 40 mg by mouth daily.   LOPERAMIDE (IMODIUM) 1 MG/5ML SOLUTION    Take 5 mLs (1 mg total) by mouth as needed for diarrhea or loose stools.   METOPROLOL SUCCINATE (TOPROL-XL) 50 MG 24 HR TABLET    Take 1 tablet (50 mg total) by mouth daily.   MULTIPLE VITAMIN (MULTIVITAMIN) TABLET    Take 1 tablet by mouth daily.     PANTOPRAZOLE (PROTONIX) 40 MG TABLET    Take 40 mg by mouth as needed (AS NEEDED FOR STOMACH).    POTASSIUM CHLORIDE (K-DUR) 10 MEQ TABLET    Take 1 tablet (10 mEq total) by mouth daily.   PROBIOTIC PRODUCT (ULTRAFLORA IMMUNE HEALTH PO)    Take 1 tablet by mouth daily.   UNABLE TO FIND    Med Name: Med pass 120 mL by mouth 2 times daily  Modified Medications   No medications on file  Discontinued Medications   AMLODIPINE (NORVASC) 2.5 MG TABLET    Take one-half tablet by mouth daily   HYDROCHLOROTHIAZIDE (MICROZIDE) 12.5 MG CAPSULE    Take 1 capsule (12.5 mg total) by mouth daily.   LISINOPRIL (PRINIVIL,ZESTRIL) 20 MG TABLET    TAKE 1 TABLET EACH DAY.     Allergies  Allergen Reactions  . Sulfur Other (See Comments)    Pt doesn't remember reaction.     REVIEW OF SYSTEMS:  GENERAL: no change in appetite, no fatigue, no weight changes, no fever, chills or weakness EYES: Denies change in vision, dry eyes, eye pain, itching or discharge EARS: Denies change in hearing, ringing in ears, or earache NOSE: Denies nasal congestion or epistaxis MOUTH and THROAT: Denies oral discomfort, gingival pain or bleeding, pain from teeth or hoarseness   RESPIRATORY: no cough, SOB, DOE, wheezing, hemoptysis CARDIAC: no chest pain, edema or palpitations GI: no abdominal pain, diarrhea, constipation, heart burn,  nausea or vomiting GU: Denies dysuria, frequency, hematuria, incontinence, or discharge PSYCHIATRIC: Denies feeling of depression or anxiety. No report of hallucinations, insomnia, paranoia, or agitation   PHYSICAL EXAMINATION  GENERAL APPEARANCE: Well nourished. In no acute distress. Normal body habitus SKIN:  Left hip surgical incision is healed HEAD: Normal in size and contour. No evidence of trauma EYES: Lids open and close normally. No blepharitis, entropion or ectropion. PERRL. Conjunctivae are clear and sclerae are white. Lenses are without  opacity EARS: Pinnae are normal. Patient hears normal voice tunes of the examiner MOUTH and THROAT: Lips are without lesions. Oral mucosa is moist and without lesions. Tongue is normal in shape, size, and color and without lesions NECK: supple, trachea midline, no neck masses, no thyroid tenderness, no thyromegaly LYMPHATICS: no LAN in the neck, no supraclavicular LAN RESPIRATORY: breathing is even & unlabored, BS CTAB CARDIAC: RRR, no murmur,no extra heart sounds, no edema GI: abdomen soft, normal BS, no masses, no tenderness, no hepatomegaly, no splenomegaly EXTREMITIES:  Able to move X 4 extremities PSYCHIATRIC: Alert and oriented X 3. Affect and behavior are appropriate  LABS/RADIOLOGY: Labs reviewed: Basic Metabolic Panel:  Recent Labs  05/09/15 0355 05/10/15 0547 05/12/15 05/12/15 0509 05/15/15  NA 135 131* 132* 132* 131*  K 4.5 4.0  --  4.0 4.4  CL 101 102  --  103  --   CO2 26 24  --  23  --   GLUCOSE 165* 135*  --  103*  --   BUN 22* 24* 12 12 18   CREATININE 1.26* 1.04* 0.8 0.81 0.8  CALCIUM 8.5* 8.2*  --  8.1*  --   MG  --  1.9  --   --   --    Liver Function Tests:  Recent Labs  05/15/15  AST 40*  ALT 38*  ALKPHOS 80   CBC:  Recent Labs  04/29/15 0951 05/08/15 1054  05/11/15 0522 05/12/15 0509 05/13/15 05/13/15 0458 05/15/15  WBC 12.5* 14.0*  < > 16.7* 15.1* 17.6 17.6* 17.5  NEUTROABS 10.6* 12.0*  --   --    --   --   --  15  HGB 13.3 13.5  < > 9.0* 8.0*  --  9.9* 10.2*  HCT 44.4 43.5  < > 28.5* 25.1*  --  30.6* 34*  MCV 65.8* 64.9*  < > 68.7* 69.1*  --  68.5*  --   PLT 471* 505*  < > 464* 534*  --  544* 565*  < > = values in this interval not displayed.    Dg Chest 1 View  05/08/2015  CLINICAL DATA:  Preop for left hip fracture. EXAM: CHEST 1 VIEW COMPARISON:  12/31/2005 FINDINGS: Normal heart size and mediastinal contours. No acute infiltrate or edema. No effusion or pneumothorax. L1 superior endplate fracture which is chronic based on 2016 lumbar spine MRI. Remote lateral right seventh rib fracture. No acute osseous findings. IMPRESSION: No evidence of active disease. Electronically Signed   By: Monte Fantasia M.D.   On: 05/08/2015 09:41   Pelvis Portable  05/08/2015  CLINICAL DATA:  ORIF of the left hip. EXAM: PORTABLE PELVIS 1-2 VIEWS COMPARISON:  Films earlier today. FINDINGS: Postoperative film shows improved alignment at the level of intratrochanteric fracture with intramedullary nail placement. Old fractures of the left pubic rami present. IMPRESSION: Improved alignment at the level of the left hip intertrochanteric fracture after ORIF. Electronically Signed   By: Aletta Edouard M.D.   On: 05/08/2015 20:53   Dg Knee Left Port  05/08/2015  CLINICAL DATA:  Intertrochanteric left femur fracture. Initial encounter. EXAM: PORTABLE LEFT KNEE - 1-2 VIEW COMPARISON:  None. FINDINGS: Limited patient positioning due to ipsilateral hip fracture. No evidence of fracture or subluxation at the knee. No visualized joint effusion but limited by obliquity. Osteopenia. IMPRESSION: Limited study with no evidence of fracture at the knee. Electronically Signed   By: Monte Fantasia M.D.   On: 05/08/2015 12:40   Dg  C-arm 1-60 Min-no Report  05/08/2015  CLINICAL DATA: surgery C-ARM 1-60 MINUTES Fluoroscopy was utilized by the requesting physician.  No radiographic interpretation.   Dg Hip Unilat With Pelvis 2-3  Views Left  05/08/2015  CLINICAL DATA:  Fall with left hip pain.  Initial encounter. EXAM: DG HIP (WITH OR WITHOUT PELVIS) 2-3V LEFT COMPARISON:  None. FINDINGS: Acute intertrochanteric left femur fracture with varus angulation. Remote left obturator ring fracture with healed distortion. No evidence of acute pelvic fracture. Located hips. IMPRESSION: 1. Angulated intertrochanteric left femur fracture. 2. Remote left obturator ring fractures. Electronically Signed   By: Monte Fantasia M.D.   On: 05/08/2015 09:35   Dg Femur Min 2 Views Left  05/08/2015  CLINICAL DATA:  Left intra medullary nail EXAM: LEFT FEMUR 2 VIEWS COMPARISON:  05/08/2015 FINDINGS: Improved positioning and alignment intertrochanteric left femur fracture. Intra medullary nail across fracture identified. Radiation Exposure Index (as provided by the fluoroscopic device): 5.39 mGy Number of Acquired Images:  5 IMPRESSION: postoperative change Electronically Signed   By: Skipper Cliche M.D.   On: 05/08/2015 20:06   Dg Femur Port Min 2 Views Left  05/08/2015  CLINICAL DATA:  Status post ORIF of basilar intratrochanteric left hip fracture. EXAM: LEFT FEMUR PORTABLE 2 VIEWS COMPARISON:  Films earlier today. FINDINGS: Alignment of the proximal left femur appears improved after placement of an intramedullary nail. There is some mild displacement remaining at the level of the intertrochanteric fracture. IMPRESSION: Improved alignment following ORIF of the left femur. Electronically Signed   By: Aletta Edouard M.D.   On: 05/08/2015 21:03    ASSESSMENT/PLAN:  Left femur fracture S/P ORIF  - follow-up with Home health PT, OT, CNA and Nurse; follow-up with orthopedics.,TDWB to LLE, continue norco 5-325 mg 1-2 tab q6h prn pain;  Acute blood loss anemia - hgb 10.2; S/P transfusion of 2 units PRBC and IV iron; continue Niferex  Protein calorie malnutrition, severe - albumin 2.59; continue supplementation  Hypertension - continue metoprolol  succinate ER 10 MDQ 1 tab by mouth daily and lisinopril 40 mg 1 tab by mouth daily  Gastritis - continue Protonix 40 mg daily when necessary  Polycythemia - continue agrylin 0.5 mg 2 capsule in am and 1 at pm  Allergic rhinitis - continue loratadine and flonase  IBS - continue loperamide as needed and bentyl as needed      I have filled out patient's discharge paperwork and written prescriptions.  Patient will receive home health PT, OTNursing and CNA.  DME provided:  Standard wheelchair with elevating leg rests, anti-tippers, removable arm rest and cushion and 16" X 16" 3-in-1 commode  Total discharge time: Greater than 30 minutes  Discharge time involved coordination of the discharge process with social worker, nursing staff and therapy department. Medical justification for home health services/DME verified.    Durenda Age, NP [employer] [phone]

## 2015-06-08 DIAGNOSIS — Z853 Personal history of malignant neoplasm of breast: Secondary | ICD-10-CM | POA: Diagnosis not present

## 2015-06-08 DIAGNOSIS — E46 Unspecified protein-calorie malnutrition: Secondary | ICD-10-CM | POA: Diagnosis not present

## 2015-06-08 DIAGNOSIS — S72142D Displaced intertrochanteric fracture of left femur, subsequent encounter for closed fracture with routine healing: Secondary | ICD-10-CM | POA: Diagnosis not present

## 2015-06-08 DIAGNOSIS — M1990 Unspecified osteoarthritis, unspecified site: Secondary | ICD-10-CM | POA: Diagnosis not present

## 2015-06-08 DIAGNOSIS — D45 Polycythemia vera: Secondary | ICD-10-CM | POA: Diagnosis not present

## 2015-06-08 DIAGNOSIS — R32 Unspecified urinary incontinence: Secondary | ICD-10-CM | POA: Diagnosis not present

## 2015-06-08 DIAGNOSIS — R296 Repeated falls: Secondary | ICD-10-CM | POA: Diagnosis not present

## 2015-06-08 DIAGNOSIS — R278 Other lack of coordination: Secondary | ICD-10-CM | POA: Diagnosis not present

## 2015-06-08 DIAGNOSIS — I10 Essential (primary) hypertension: Secondary | ICD-10-CM | POA: Diagnosis not present

## 2015-06-08 DIAGNOSIS — K580 Irritable bowel syndrome with diarrhea: Secondary | ICD-10-CM | POA: Diagnosis not present

## 2015-06-08 DIAGNOSIS — I341 Nonrheumatic mitral (valve) prolapse: Secondary | ICD-10-CM | POA: Diagnosis not present

## 2015-06-08 DIAGNOSIS — M6281 Muscle weakness (generalized): Secondary | ICD-10-CM | POA: Diagnosis not present

## 2015-06-08 DIAGNOSIS — Z7902 Long term (current) use of antithrombotics/antiplatelets: Secondary | ICD-10-CM | POA: Diagnosis not present

## 2015-06-08 DIAGNOSIS — W108XXD Fall (on) (from) other stairs and steps, subsequent encounter: Secondary | ICD-10-CM | POA: Diagnosis not present

## 2015-06-09 DIAGNOSIS — I341 Nonrheumatic mitral (valve) prolapse: Secondary | ICD-10-CM | POA: Diagnosis not present

## 2015-06-09 DIAGNOSIS — D45 Polycythemia vera: Secondary | ICD-10-CM | POA: Diagnosis not present

## 2015-06-09 DIAGNOSIS — S72142D Displaced intertrochanteric fracture of left femur, subsequent encounter for closed fracture with routine healing: Secondary | ICD-10-CM | POA: Diagnosis not present

## 2015-06-09 DIAGNOSIS — I10 Essential (primary) hypertension: Secondary | ICD-10-CM | POA: Diagnosis not present

## 2015-06-09 DIAGNOSIS — W108XXD Fall (on) (from) other stairs and steps, subsequent encounter: Secondary | ICD-10-CM | POA: Diagnosis not present

## 2015-06-09 DIAGNOSIS — M6281 Muscle weakness (generalized): Secondary | ICD-10-CM | POA: Diagnosis not present

## 2015-06-10 ENCOUNTER — Emergency Department (HOSPITAL_COMMUNITY)
Admission: EM | Admit: 2015-06-10 | Discharge: 2015-06-10 | Discharge status: Against Medical Advice | Payer: Medicare Other

## 2015-06-10 DIAGNOSIS — E559 Vitamin D deficiency, unspecified: Secondary | ICD-10-CM | POA: Diagnosis not present

## 2015-06-10 DIAGNOSIS — M8000XA Age-related osteoporosis with current pathological fracture, unspecified site, initial encounter for fracture: Secondary | ICD-10-CM | POA: Diagnosis not present

## 2015-06-10 DIAGNOSIS — D473 Essential (hemorrhagic) thrombocythemia: Secondary | ICD-10-CM | POA: Diagnosis not present

## 2015-06-10 DIAGNOSIS — R636 Underweight: Secondary | ICD-10-CM | POA: Diagnosis not present

## 2015-06-10 DIAGNOSIS — I10 Essential (primary) hypertension: Secondary | ICD-10-CM | POA: Diagnosis not present

## 2015-06-10 NOTE — ED Notes (Signed)
Pt is calling the cancer center for the possibility of her bypassing the ED.

## 2015-06-10 NOTE — ED Notes (Signed)
Patient called for triage, not in lobby.

## 2015-06-10 NOTE — ED Notes (Signed)
Called for triage, no response.

## 2015-06-10 NOTE — ED Notes (Signed)
Patient called to triage all parts of lobby x 2, pt not in lobby. No individuals present in lobby who appear to be near to 80 years old.

## 2015-06-11 ENCOUNTER — Other Ambulatory Visit: Payer: Self-pay | Admitting: *Deleted

## 2015-06-11 ENCOUNTER — Encounter: Payer: Medicare Other | Admitting: Nurse Practitioner

## 2015-06-11 ENCOUNTER — Other Ambulatory Visit (HOSPITAL_BASED_OUTPATIENT_CLINIC_OR_DEPARTMENT_OTHER): Payer: Medicare Other

## 2015-06-11 ENCOUNTER — Encounter: Payer: Self-pay | Admitting: Nurse Practitioner

## 2015-06-11 ENCOUNTER — Ambulatory Visit (HOSPITAL_BASED_OUTPATIENT_CLINIC_OR_DEPARTMENT_OTHER): Payer: Medicare Other

## 2015-06-11 ENCOUNTER — Ambulatory Visit (HOSPITAL_BASED_OUTPATIENT_CLINIC_OR_DEPARTMENT_OTHER): Payer: Medicare Other | Admitting: Nurse Practitioner

## 2015-06-11 VITALS — BP 115/54 | HR 97 | Temp 98.0°F | Resp 16 | Ht 63.0 in

## 2015-06-11 DIAGNOSIS — E871 Hypo-osmolality and hyponatremia: Secondary | ICD-10-CM | POA: Diagnosis not present

## 2015-06-11 DIAGNOSIS — E860 Dehydration: Secondary | ICD-10-CM

## 2015-06-11 DIAGNOSIS — D45 Polycythemia vera: Secondary | ICD-10-CM | POA: Diagnosis not present

## 2015-06-11 LAB — CBC WITH DIFFERENTIAL/PLATELET
BASO%: 0.4 % (ref 0.0–2.0)
Basophils Absolute: 0.1 10*3/uL (ref 0.0–0.1)
EOS%: 1.9 % (ref 0.0–7.0)
Eosinophils Absolute: 0.3 10*3/uL (ref 0.0–0.5)
HCT: 49.9 % — ABNORMAL HIGH (ref 34.8–46.6)
HGB: 16.1 g/dL — ABNORMAL HIGH (ref 11.6–15.9)
LYMPH%: 4.7 % — AB (ref 14.0–49.7)
MCH: 27 pg (ref 25.1–34.0)
MCHC: 32.2 g/dL (ref 31.5–36.0)
MCV: 83.7 fL (ref 79.5–101.0)
MONO#: 0.8 10*3/uL (ref 0.1–0.9)
MONO%: 5.7 % (ref 0.0–14.0)
NEUT%: 87.3 % — ABNORMAL HIGH (ref 38.4–76.8)
NEUTROS ABS: 12.4 10*3/uL — AB (ref 1.5–6.5)
Platelets: 463 10*3/uL — ABNORMAL HIGH (ref 145–400)
RBC: 5.96 10*6/uL — AB (ref 3.70–5.45)
RDW: 29.8 % — ABNORMAL HIGH (ref 11.2–14.5)
WBC: 14.1 10*3/uL — AB (ref 3.9–10.3)
lymph#: 0.7 10*3/uL — ABNORMAL LOW (ref 0.9–3.3)

## 2015-06-11 LAB — COMPREHENSIVE METABOLIC PANEL
ALT: 12 U/L (ref 0–55)
AST: 16 U/L (ref 5–34)
Albumin: 3.5 g/dL (ref 3.5–5.0)
Alkaline Phosphatase: 160 U/L — ABNORMAL HIGH (ref 40–150)
Anion Gap: 8 mEq/L (ref 3–11)
BILIRUBIN TOTAL: 0.6 mg/dL (ref 0.20–1.20)
BUN: 16.1 mg/dL (ref 7.0–26.0)
CO2: 25 meq/L (ref 22–29)
CREATININE: 1 mg/dL (ref 0.6–1.1)
Calcium: 9.9 mg/dL (ref 8.4–10.4)
Chloride: 94 mEq/L — ABNORMAL LOW (ref 98–109)
EGFR: 52 mL/min/{1.73_m2} — ABNORMAL LOW (ref >90)
GLUCOSE: 149 mg/dL — AB (ref 70–140)
Potassium: 4.2 mEq/L (ref 3.5–5.1)
SODIUM: 127 meq/L — AB (ref 136–145)
TOTAL PROTEIN: 6.4 g/dL (ref 6.4–8.3)

## 2015-06-11 MED ORDER — SODIUM CHLORIDE 0.9 % IV SOLN
Freq: Once | INTRAVENOUS | Status: AC
  Administered 2015-06-11: 12:00:00 via INTRAVENOUS

## 2015-06-11 NOTE — Patient Instructions (Signed)

## 2015-06-11 NOTE — Assessment & Plan Note (Signed)
Patient has a history of chronic hyponatremia.  Patient states that she has been living in a nursing facility; that had held all of patient's salt and changed her diet to a low sodium diet.  However, patient was found to have increased hyponatremia yesterday when visiting with her primary care physician Dr. Albesa Seen.  Sodium level was 123 yesterday.  Patient presents to the [place] today for follow-up.  Patient states that she has no new symptoms whatsoever.  She denies any neurological changes or confusion.  Labs obtained today revealed sodium was 127 and chloride was 97.  Patient once again had no other complaints.  Patient's vital signs were stable; and patient was afebrile.  Patient will receive IV fluid rehydration today.  She was also encouraged to add salt back into her diet.  Advice patient would review all lab results with Dr. Burr Medico; and let her know regarding the possibility of a future phlebotomy.

## 2015-06-11 NOTE — Assessment & Plan Note (Addendum)
Patient receives intermittent phlebotomies on an as-needed basis only.  The goal is for the patient to only receive phlebotomies when her hematocrit is greater than 45%.  Patient also continues to take anagrelide orally as directed.  Blood counts obtained today reveal a WBC of 14.1, ANC 12.4, hemoglobin 16.1, and platelet count is 463.  Hematocrit was 49.9% today.  Will review all lab results with Dr. Burr Medico; and then make decision regarding possibility of a phlebotomy again this week.  At this time and-patient is asymptomatic regarding her polycythemia..  Also, patient is scheduled for labs, visit, and a possible phlebotomy again on 07/01/2015.

## 2015-06-11 NOTE — Progress Notes (Signed)
SYMPTOM MANAGEMENT CLINIC    Chief Complaint: Hyponatremia  HPI:  [name] 82 y.o. female diagnosed with polycythemia vera.  Currently undergoing anagrelide oral therapy and therapeutic phlebotomies on an as-needed basis.   Patient has a history of chronic hyponatremia.  Patient states that she has been living in a nursing facility; that had held all of patient's salt and changed her diet to a low sodium diet.  However, patient was found to have increased hyponatremia yesterday when visiting with her primary care physician Dr. Albesa Seen.  Sodium level was 123 yesterday.  Patient presents to the [facility] today for follow-up.  Patient states that she has no new symptoms whatsoever.  She denies any neurological changes or confusion.  Labs obtained today revealed sodium was 127 and chloride was 97.  Patient once again had no other complaints.  Patient's vital signs were stable; and patient was afebrile.  Patient will receive IV fluid rehydration today.  She was also encouraged to add salt back into her diet.  Advice patient would review all lab results with Dr. Burr Medico; and let her know regarding the possibility of a future phlebotomy.   No history exists.    Review of Systems  All other systems reviewed and are negative.   Past Medical History  Diagnosis Date  . MVP (mitral valve prolapse)   . Hypertension   . Chest tightness   . Palpitation   . PVC's (premature ventricular contractions)   . Breast cancer ([place])     right  . Polycythemia   . Chronic gastritis   . Diverticulosis   . Candida esophagitis ([place])   . Arthritis   . Unsteady gait   . Closed intertrochanteric fracture of left femur with routine healing   . Acute blood loss anemia   . IBS (irritable bowel syndrome)   . Rhinitis   . Atrophic gastritis     Past Surgical History  Procedure Laterality Date  . Tonsillectomy    . Appendectomy    . Cardiovascular stress test  12/23/2000    EF 70%  .  Transthoracic echocardiogram  03/05/2010    EF 55-60%  . Abdominal hysterectomy      ovaries taken  . Breast lumpectomy      right  . Elbow fracture surgery      left  . Cataract extraction      bilateral  . Hematoma evacuation      right  . Femur im nail Left 05/08/2015    Procedure: INTERTROCHANTERIC FEMORAL NAIL;  Surgeon: Rod Can, MD;  Location: [facility];  Service: Orthopedics;  Laterality: Left;    has Malignant neoplasm of female breast ([place]); Polycythemia vera ([place]); Atrophic gastritis; DIVERTICULOSIS OF COLON; ARTHRITIS; ABDOMINAL PAIN, EPIGASTRIC; HTN (hypertension); MVP (mitral valve prolapse); Closed left hip fracture ([place]); Leukocytosis; Fracture, intertrochanteric, left femur ([place]); Faintness; Acute blood loss anemia; Thrombocytosis ([place]); and Dehydration on her problem list.    is allergic to sulfur.    Medication List       This list is accurate as of: 06/11/15  5:44 PM.  Always use your most recent med list.               anagrelide 0.5 MG capsule  Commonly known as:  AGRYLIN  TAKE 2 CAPSULES IN THE MORNING AND 1 CAPSULE IN THE AFTERNOON.     aspirin 81 MG tablet  Take 81 mg by mouth daily.     bisacodyl 10 MG suppository  Commonly known as:  DULCOLAX  Place 10 mg rectally daily as needed for moderate constipation. Reported on 06/11/2015     CALCIUM 500/D PO  Take 1 capsule by mouth daily.     colestipol 1 g tablet  Commonly known as:  COLESTID  Take 1 g by mouth daily. Reported on 05/14/2015     dicyclomine 10 MG capsule  Commonly known as:  BENTYL  Take 1 capsule (10 mg total) by mouth 4 (four) times daily as needed for spasms.     enoxaparin 30 MG/0.3ML injection  Commonly known as:  LOVENOX  Inject 0.3 mLs (30 mg total) into the skin daily. Treatment for 25 days as part of DVT prophylaxis after hip surgery     fluticasone 50 MCG/ACT nasal spray  Commonly known as:  FLONASE  Place 2 sprays into the nose as needed for allergies or rhinitis.  Reported on 06/11/2015     HYDROcodone-acetaminophen 5-325 MG tablet  Commonly known as:  NORCO/VICODIN  Take 1-2 tablets by mouth every 6 (six) hours as needed for moderate pain.     iron polysaccharides 150 MG capsule  Commonly known as:  NIFEREX  Take 1 capsule (150 mg total) by mouth daily.     lisinopril 40 MG tablet  Commonly known as:  PRINIVIL,ZESTRIL  Take 40 mg by mouth daily.     loperamide 1 MG/5ML solution  Commonly known as:  IMODIUM  Take 5 mLs (1 mg total) by mouth as needed for diarrhea or loose stools.     metoprolol succinate 50 MG 24 hr tablet  Commonly known as:  TOPROL-XL  Take 1 tablet (50 mg total) by mouth daily.     multivitamin tablet  Take 1 tablet by mouth daily.     pantoprazole 40 MG tablet  Commonly known as:  PROTONIX  Take 40 mg by mouth as needed (AS NEEDED FOR STOMACH).     potassium chloride 10 MEQ tablet  Commonly known as:  K-DUR  Take 1 tablet (10 mEq total) by mouth daily.     ULTRAFLORA IMMUNE HEALTH PO  Take 1 tablet by mouth daily.     UNABLE TO FIND  Med Name: Med pass 120 mL by mouth 2 times daily     Vitamin D3 1000 units Caps  Take 2,000 mg by mouth 2 (two) times daily.     ZYRTEC PO  Take 1 tablet by mouth as needed (AS NEEDED FOR ALLERGIES).         PHYSICAL EXAMINATION  Oncology Vitals 06/11/2015 06/11/2015  Height - 160 cm  Weight - (No Data)  Weight (lbs) - (No Data)  BMI (kg/m2) - -  Temp - 98  Pulse 74 97  Resp 18 16  SpO2 98 98  BSA (m2) - -   BP Readings from Last 2 Encounters:  06/11/15 127/56  06/11/15 115/54    Physical Exam  Constitutional: She is oriented to person, place, and time. Vital signs are normal. She appears dehydrated. She appears unhealthy.  HENT:  Head: Normocephalic and atraumatic.  Eyes: Conjunctivae and EOM are normal. Pupils are equal, round, and reactive to light. Right eye exhibits no discharge. Left eye exhibits no discharge. No scleral icterus.  Neck: Normal range of  motion.  Pulmonary/Chest: Effort normal. No respiratory distress.  Neurological: She is alert and oriented to person, place, and time.  Skin: Skin is warm and dry. There is pallor.  Psychiatric: Affect normal.  Nursing note and vitals  reviewed.   LABORATORY DATA:. Appointment on 06/11/2015  Component Date Value Ref Range Status  . WBC 06/11/2015 14.1* 3.9 - 10.3 10e3/uL Final  . NEUT# 06/11/2015 12.4* 1.5 - 6.5 10e3/uL Final  . HGB 06/11/2015 16.1* 11.6 - 15.9 g/dL Final  . HCT 06/11/2015 49.9* 34.8 - 46.6 % Final  . Platelets 06/11/2015 463* 145 - 400 10e3/uL Final  . MCV 06/11/2015 83.7  79.5 - 101.0 fL Final  . MCH 06/11/2015 27.0  25.1 - 34.0 pg Final  . MCHC 06/11/2015 32.2  31.5 - 36.0 g/dL Final  . RBC 06/11/2015 5.96* 3.70 - 5.45 10e6/uL Final  . RDW 06/11/2015 29.8* 11.2 - 14.5 % Final  . lymph# 06/11/2015 0.7* 0.9 - 3.3 10e3/uL Final  . MONO# 06/11/2015 0.8  0.1 - 0.9 10e3/uL Final  . Eosinophils Absolute 06/11/2015 0.3  0.0 - 0.5 10e3/uL Final  . Basophils Absolute 06/11/2015 0.1  0.0 - 0.1 10e3/uL Final  . NEUT% 06/11/2015 87.3* 38.4 - 76.8 % Final  . LYMPH% 06/11/2015 4.7* 14.0 - 49.7 % Final  . MONO% 06/11/2015 5.7  0.0 - 14.0 % Final  . EOS% 06/11/2015 1.9  0.0 - 7.0 % Final  . BASO% 06/11/2015 0.4  0.0 - 2.0 % Final  . Sodium 06/11/2015 127* 136 - 145 mEq/L Final  . Potassium 06/11/2015 4.2  3.5 - 5.1 mEq/L Final  . Chloride 06/11/2015 94* 98 - 109 mEq/L Final  . CO2 06/11/2015 25  22 - 29 mEq/L Final  . Glucose 06/11/2015 149* 70 - 140 mg/dl Final   Glucose reference range is for nonfasting patients. Fasting glucose reference range is 70- 100.  Marland Kitchen BUN 06/11/2015 16.1  7.0 - 26.0 mg/dL Final  . Creatinine 06/11/2015 1.0  0.6 - 1.1 mg/dL Final  . Total Bilirubin 06/11/2015 0.60  0.20 - 1.20 mg/dL Final  . Alkaline Phosphatase 06/11/2015 160* 40 - 150 U/L Final  . AST 06/11/2015 16  5 - 34 U/L Final  . ALT 06/11/2015 12  0 - 55 U/L Final  . Total Protein  06/11/2015 6.4  6.4 - 8.3 g/dL Final  . Albumin 06/11/2015 3.5  3.5 - 5.0 g/dL Final  . Calcium 06/11/2015 9.9  8.4 - 10.4 mg/dL Final  . Anion Gap 06/11/2015 8  3 - 11 mEq/L Final  . EGFR 06/11/2015 52* >90 ml/min/1.73 m2 Final   eGFR is calculated using the CKD-EPI Creatinine Equation (2009)    RADIOGRAPHIC STUDIES: No results found.  ASSESSMENT/PLAN:    Polycythemia vera ([place]) Patient receives intermittent phlebotomies on an as-needed basis only.  The goal is for the patient to only receive phlebotomies when her hematocrit is greater than 45%.  Patient also continues to take anagrelide orally as directed.  Blood counts obtained today reveal a WBC of 14.1, ANC 12.4, hemoglobin 16.1, and platelet count is 463.  Hematocrit was 49.9% today.  Will review all lab results with Dr. Burr Medico; and then make decision regarding possibility of a phlebotomy again this week.  At this time and-patient is asymptomatic regarding her polycythemia..  Also, patient is scheduled for labs, visit, and a possible phlebotomy again on 07/01/2015.  Dehydration Patient has a history of chronic hyponatremia.  Patient states that she has been living in a nursing facility; that had held all of patient's salt and changed her diet to a low sodium diet.  However, patient was found to have increased hyponatremia yesterday when visiting with her primary care physician Dr. Albesa Seen.  Sodium  level was 123 yesterday.  Patient presents to the [facility] today for follow-up.  Patient states that she has no new symptoms whatsoever.  She denies any neurological changes or confusion.  Labs obtained today revealed sodium was 127 and chloride was 97.  Patient once again had no other complaints.  Patient's vital signs were stable; and patient was afebrile.  Patient will receive IV fluid rehydration today.  She was also encouraged to add salt back into her diet.  Advice patient would review all lab results with Dr. Burr Medico;  and let her know regarding the possibility of a future phlebotomy.   Patient stated understanding of all instructions; and was in agreement with this plan of care. The patient knows to call the clinic with any problems, questions or concerns.   Total time spent with patient was 25 minutes;  with greater than 75 percent of that time spent in face to face counseling regarding patient's symptoms,  and coordination of care and follow up.  Disclaimer:This dictation was prepared with Dragon/digital dictation along with [employer]. Any transcriptional errors that result from this process are unintentional.  Drue Second, NP 06/11/2015

## 2015-06-12 DIAGNOSIS — I341 Nonrheumatic mitral (valve) prolapse: Secondary | ICD-10-CM | POA: Diagnosis not present

## 2015-06-12 DIAGNOSIS — M6281 Muscle weakness (generalized): Secondary | ICD-10-CM | POA: Diagnosis not present

## 2015-06-12 DIAGNOSIS — S72142D Displaced intertrochanteric fracture of left femur, subsequent encounter for closed fracture with routine healing: Secondary | ICD-10-CM | POA: Diagnosis not present

## 2015-06-12 DIAGNOSIS — I10 Essential (primary) hypertension: Secondary | ICD-10-CM | POA: Diagnosis not present

## 2015-06-12 DIAGNOSIS — D45 Polycythemia vera: Secondary | ICD-10-CM | POA: Diagnosis not present

## 2015-06-12 DIAGNOSIS — W108XXD Fall (on) (from) other stairs and steps, subsequent encounter: Secondary | ICD-10-CM | POA: Diagnosis not present

## 2015-06-13 DIAGNOSIS — W108XXD Fall (on) (from) other stairs and steps, subsequent encounter: Secondary | ICD-10-CM | POA: Diagnosis not present

## 2015-06-13 DIAGNOSIS — M6281 Muscle weakness (generalized): Secondary | ICD-10-CM | POA: Diagnosis not present

## 2015-06-13 DIAGNOSIS — I10 Essential (primary) hypertension: Secondary | ICD-10-CM | POA: Diagnosis not present

## 2015-06-13 DIAGNOSIS — I341 Nonrheumatic mitral (valve) prolapse: Secondary | ICD-10-CM | POA: Diagnosis not present

## 2015-06-13 DIAGNOSIS — S72142D Displaced intertrochanteric fracture of left femur, subsequent encounter for closed fracture with routine healing: Secondary | ICD-10-CM | POA: Diagnosis not present

## 2015-06-13 DIAGNOSIS — D45 Polycythemia vera: Secondary | ICD-10-CM | POA: Diagnosis not present

## 2015-06-14 DIAGNOSIS — S72142D Displaced intertrochanteric fracture of left femur, subsequent encounter for closed fracture with routine healing: Secondary | ICD-10-CM | POA: Diagnosis not present

## 2015-06-14 DIAGNOSIS — W108XXD Fall (on) (from) other stairs and steps, subsequent encounter: Secondary | ICD-10-CM | POA: Diagnosis not present

## 2015-06-14 DIAGNOSIS — D45 Polycythemia vera: Secondary | ICD-10-CM | POA: Diagnosis not present

## 2015-06-14 DIAGNOSIS — I341 Nonrheumatic mitral (valve) prolapse: Secondary | ICD-10-CM | POA: Diagnosis not present

## 2015-06-14 DIAGNOSIS — M6281 Muscle weakness (generalized): Secondary | ICD-10-CM | POA: Diagnosis not present

## 2015-06-14 DIAGNOSIS — I10 Essential (primary) hypertension: Secondary | ICD-10-CM | POA: Diagnosis not present

## 2015-06-16 ENCOUNTER — Telehealth: Payer: Self-pay | Admitting: Cardiovascular Disease

## 2015-06-16 DIAGNOSIS — W108XXD Fall (on) (from) other stairs and steps, subsequent encounter: Secondary | ICD-10-CM | POA: Diagnosis not present

## 2015-06-16 DIAGNOSIS — I341 Nonrheumatic mitral (valve) prolapse: Secondary | ICD-10-CM | POA: Diagnosis not present

## 2015-06-16 DIAGNOSIS — S72142D Displaced intertrochanteric fracture of left femur, subsequent encounter for closed fracture with routine healing: Secondary | ICD-10-CM | POA: Diagnosis not present

## 2015-06-16 DIAGNOSIS — D45 Polycythemia vera: Secondary | ICD-10-CM | POA: Diagnosis not present

## 2015-06-16 DIAGNOSIS — M6281 Muscle weakness (generalized): Secondary | ICD-10-CM | POA: Diagnosis not present

## 2015-06-16 DIAGNOSIS — I10 Essential (primary) hypertension: Secondary | ICD-10-CM | POA: Diagnosis not present

## 2015-06-16 NOTE — Telephone Encounter (Signed)
New Message  Pt is in the nursing home and there is a discrepancy about the amlodipine that she would like to discuss in detail about. No further details provided. Please call

## 2015-06-17 NOTE — Telephone Encounter (Signed)
Spoke with Morey Hummingbird at [facility] who states patient was recently at [facility] and was advised to stop amlodipine and increase lisinopril.  I updated the patient's medication list and Morey Hummingbird states she advised patient to call and make an appointment with Dr. Acie Fredrickson.  She asked that I talk with patient about appointment since she is not sure of patient's schedule.   Spoke with patient and scheduled her to see Dr. Acie Fredrickson on June 5.  She asked that she only be scheduled with Dr. Acie Fredrickson and not an APP.  She is dependent upon family for transportation so she will call me back if she needs to reschedule.  She thanked me for the call.

## 2015-06-18 DIAGNOSIS — W108XXD Fall (on) (from) other stairs and steps, subsequent encounter: Secondary | ICD-10-CM | POA: Diagnosis not present

## 2015-06-18 DIAGNOSIS — D45 Polycythemia vera: Secondary | ICD-10-CM | POA: Diagnosis not present

## 2015-06-18 DIAGNOSIS — I341 Nonrheumatic mitral (valve) prolapse: Secondary | ICD-10-CM | POA: Diagnosis not present

## 2015-06-18 DIAGNOSIS — I10 Essential (primary) hypertension: Secondary | ICD-10-CM | POA: Diagnosis not present

## 2015-06-18 DIAGNOSIS — M6281 Muscle weakness (generalized): Secondary | ICD-10-CM | POA: Diagnosis not present

## 2015-06-18 DIAGNOSIS — S72142D Displaced intertrochanteric fracture of left femur, subsequent encounter for closed fracture with routine healing: Secondary | ICD-10-CM | POA: Diagnosis not present

## 2015-06-19 ENCOUNTER — Other Ambulatory Visit: Payer: Self-pay | Admitting: Hematology

## 2015-06-19 DIAGNOSIS — I10 Essential (primary) hypertension: Secondary | ICD-10-CM | POA: Diagnosis not present

## 2015-06-19 DIAGNOSIS — M6281 Muscle weakness (generalized): Secondary | ICD-10-CM | POA: Diagnosis not present

## 2015-06-19 DIAGNOSIS — S72142D Displaced intertrochanteric fracture of left femur, subsequent encounter for closed fracture with routine healing: Secondary | ICD-10-CM | POA: Diagnosis not present

## 2015-06-19 DIAGNOSIS — I341 Nonrheumatic mitral (valve) prolapse: Secondary | ICD-10-CM | POA: Diagnosis not present

## 2015-06-19 DIAGNOSIS — W108XXD Fall (on) (from) other stairs and steps, subsequent encounter: Secondary | ICD-10-CM | POA: Diagnosis not present

## 2015-06-19 DIAGNOSIS — D45 Polycythemia vera: Secondary | ICD-10-CM | POA: Diagnosis not present

## 2015-06-20 DIAGNOSIS — I10 Essential (primary) hypertension: Secondary | ICD-10-CM | POA: Diagnosis not present

## 2015-06-20 DIAGNOSIS — M6281 Muscle weakness (generalized): Secondary | ICD-10-CM | POA: Diagnosis not present

## 2015-06-20 DIAGNOSIS — D45 Polycythemia vera: Secondary | ICD-10-CM | POA: Diagnosis not present

## 2015-06-20 DIAGNOSIS — W108XXD Fall (on) (from) other stairs and steps, subsequent encounter: Secondary | ICD-10-CM | POA: Diagnosis not present

## 2015-06-20 DIAGNOSIS — S72142D Displaced intertrochanteric fracture of left femur, subsequent encounter for closed fracture with routine healing: Secondary | ICD-10-CM | POA: Diagnosis not present

## 2015-06-20 DIAGNOSIS — I341 Nonrheumatic mitral (valve) prolapse: Secondary | ICD-10-CM | POA: Diagnosis not present

## 2015-06-24 DIAGNOSIS — I10 Essential (primary) hypertension: Secondary | ICD-10-CM | POA: Diagnosis not present

## 2015-06-24 DIAGNOSIS — I341 Nonrheumatic mitral (valve) prolapse: Secondary | ICD-10-CM | POA: Diagnosis not present

## 2015-06-24 DIAGNOSIS — M6281 Muscle weakness (generalized): Secondary | ICD-10-CM | POA: Diagnosis not present

## 2015-06-24 DIAGNOSIS — S72142D Displaced intertrochanteric fracture of left femur, subsequent encounter for closed fracture with routine healing: Secondary | ICD-10-CM | POA: Diagnosis not present

## 2015-06-24 DIAGNOSIS — W108XXD Fall (on) (from) other stairs and steps, subsequent encounter: Secondary | ICD-10-CM | POA: Diagnosis not present

## 2015-06-24 DIAGNOSIS — D45 Polycythemia vera: Secondary | ICD-10-CM | POA: Diagnosis not present

## 2015-06-25 DIAGNOSIS — I341 Nonrheumatic mitral (valve) prolapse: Secondary | ICD-10-CM | POA: Diagnosis not present

## 2015-06-25 DIAGNOSIS — W108XXD Fall (on) (from) other stairs and steps, subsequent encounter: Secondary | ICD-10-CM | POA: Diagnosis not present

## 2015-06-25 DIAGNOSIS — S72142D Displaced intertrochanteric fracture of left femur, subsequent encounter for closed fracture with routine healing: Secondary | ICD-10-CM | POA: Diagnosis not present

## 2015-06-25 DIAGNOSIS — I10 Essential (primary) hypertension: Secondary | ICD-10-CM | POA: Diagnosis not present

## 2015-06-25 DIAGNOSIS — D45 Polycythemia vera: Secondary | ICD-10-CM | POA: Diagnosis not present

## 2015-06-25 DIAGNOSIS — M6281 Muscle weakness (generalized): Secondary | ICD-10-CM | POA: Diagnosis not present

## 2015-06-26 DIAGNOSIS — M6281 Muscle weakness (generalized): Secondary | ICD-10-CM | POA: Diagnosis not present

## 2015-06-26 DIAGNOSIS — I10 Essential (primary) hypertension: Secondary | ICD-10-CM | POA: Diagnosis not present

## 2015-06-26 DIAGNOSIS — S72142D Displaced intertrochanteric fracture of left femur, subsequent encounter for closed fracture with routine healing: Secondary | ICD-10-CM | POA: Diagnosis not present

## 2015-06-26 DIAGNOSIS — W108XXD Fall (on) (from) other stairs and steps, subsequent encounter: Secondary | ICD-10-CM | POA: Diagnosis not present

## 2015-06-26 DIAGNOSIS — D45 Polycythemia vera: Secondary | ICD-10-CM | POA: Diagnosis not present

## 2015-06-26 DIAGNOSIS — I341 Nonrheumatic mitral (valve) prolapse: Secondary | ICD-10-CM | POA: Diagnosis not present

## 2015-06-27 DIAGNOSIS — I10 Essential (primary) hypertension: Secondary | ICD-10-CM | POA: Diagnosis not present

## 2015-06-27 DIAGNOSIS — D45 Polycythemia vera: Secondary | ICD-10-CM | POA: Diagnosis not present

## 2015-06-27 DIAGNOSIS — W108XXD Fall (on) (from) other stairs and steps, subsequent encounter: Secondary | ICD-10-CM | POA: Diagnosis not present

## 2015-06-27 DIAGNOSIS — I341 Nonrheumatic mitral (valve) prolapse: Secondary | ICD-10-CM | POA: Diagnosis not present

## 2015-06-27 DIAGNOSIS — M6281 Muscle weakness (generalized): Secondary | ICD-10-CM | POA: Diagnosis not present

## 2015-06-27 DIAGNOSIS — S72142D Displaced intertrochanteric fracture of left femur, subsequent encounter for closed fracture with routine healing: Secondary | ICD-10-CM | POA: Diagnosis not present

## 2015-06-30 DIAGNOSIS — I341 Nonrheumatic mitral (valve) prolapse: Secondary | ICD-10-CM | POA: Diagnosis not present

## 2015-06-30 DIAGNOSIS — M6281 Muscle weakness (generalized): Secondary | ICD-10-CM | POA: Diagnosis not present

## 2015-06-30 DIAGNOSIS — W108XXD Fall (on) (from) other stairs and steps, subsequent encounter: Secondary | ICD-10-CM | POA: Diagnosis not present

## 2015-06-30 DIAGNOSIS — I10 Essential (primary) hypertension: Secondary | ICD-10-CM | POA: Diagnosis not present

## 2015-06-30 DIAGNOSIS — S72142D Displaced intertrochanteric fracture of left femur, subsequent encounter for closed fracture with routine healing: Secondary | ICD-10-CM | POA: Diagnosis not present

## 2015-06-30 DIAGNOSIS — D45 Polycythemia vera: Secondary | ICD-10-CM | POA: Diagnosis not present

## 2015-07-01 ENCOUNTER — Encounter: Payer: Self-pay | Admitting: Hematology

## 2015-07-01 ENCOUNTER — Ambulatory Visit (HOSPITAL_BASED_OUTPATIENT_CLINIC_OR_DEPARTMENT_OTHER): Payer: Medicare Other

## 2015-07-01 ENCOUNTER — Other Ambulatory Visit (HOSPITAL_BASED_OUTPATIENT_CLINIC_OR_DEPARTMENT_OTHER): Payer: Medicare Other

## 2015-07-01 ENCOUNTER — Ambulatory Visit (HOSPITAL_BASED_OUTPATIENT_CLINIC_OR_DEPARTMENT_OTHER): Payer: Medicare Other | Admitting: Hematology

## 2015-07-01 VITALS — BP 161/71 | HR 90 | Temp 97.5°F | Resp 18 | Ht 63.0 in | Wt 93.7 lb

## 2015-07-01 VITALS — BP 153/71 | HR 85 | Temp 97.7°F | Resp 16

## 2015-07-01 DIAGNOSIS — M810 Age-related osteoporosis without current pathological fracture: Secondary | ICD-10-CM | POA: Insufficient documentation

## 2015-07-01 DIAGNOSIS — Z86000 Personal history of in-situ neoplasm of breast: Secondary | ICD-10-CM

## 2015-07-01 DIAGNOSIS — D45 Polycythemia vera: Secondary | ICD-10-CM

## 2015-07-01 DIAGNOSIS — C50511 Malignant neoplasm of lower-outer quadrant of right female breast: Secondary | ICD-10-CM

## 2015-07-01 DIAGNOSIS — D751 Secondary polycythemia: Secondary | ICD-10-CM

## 2015-07-01 LAB — CBC WITH DIFFERENTIAL/PLATELET
BASO%: 0.3 % (ref 0.0–2.0)
Basophils Absolute: 0 10*3/uL (ref 0.0–0.1)
EOS%: 2.4 % (ref 0.0–7.0)
Eosinophils Absolute: 0.3 10*3/uL (ref 0.0–0.5)
HCT: 53.9 % — ABNORMAL HIGH (ref 34.8–46.6)
HGB: 17.8 g/dL — ABNORMAL HIGH (ref 11.6–15.9)
LYMPH#: 0.7 10*3/uL — AB (ref 0.9–3.3)
LYMPH%: 6.2 % — AB (ref 14.0–49.7)
MCH: 27.7 pg (ref 25.1–34.0)
MCHC: 32.9 g/dL (ref 31.5–36.0)
MCV: 84.2 fL (ref 79.5–101.0)
MONO#: 0.8 10*3/uL (ref 0.1–0.9)
MONO%: 7 % (ref 0.0–14.0)
NEUT%: 84.1 % — ABNORMAL HIGH (ref 38.4–76.8)
NEUTROS ABS: 9.2 10*3/uL — AB (ref 1.5–6.5)
PLATELETS: 322 10*3/uL (ref 145–400)
RBC: 6.4 10*6/uL — AB (ref 3.70–5.45)
RDW: 24.8 % — ABNORMAL HIGH (ref 11.2–14.5)
WBC: 10.9 10*3/uL — AB (ref 3.9–10.3)

## 2015-07-01 LAB — TECHNOLOGIST REVIEW

## 2015-07-01 MED ORDER — SODIUM CHLORIDE 0.9 % IV SOLN
Freq: Once | INTRAVENOUS | Status: AC
  Administered 2015-07-01: 12:00:00 via INTRAVENOUS

## 2015-07-01 NOTE — Progress Notes (Signed)
[facility] ONCOLOGY OFFICE PROGRESS NOTE DATE OF VISIT: 07/01/2015   Kelly Pel, MD [place] [id number]  DIAGNOSIS: Polycythemia vera ([place])  Malignant neoplasm of lower-outer quadrant of right female breast ([place])  Osteoporosis, Breast Cancer   PREVIOUS AND CURRENT THERAPY:  1. Phlebotomy if hematocrit is greater than or equal to 42% (changed to 45% in May 2016). The patient receives 300-400 mL of normal saline following each 2/1-1-unit phlebotomy  2. Anagrelide 0.5 mg twice daily and aspirin 81 mg daily, anagrelide increased to 1.0mg  AM and 0.5mg  PM since Feb 2016  3. She previously tried Hydrea 500mg  daily, developed thrombocytopenia, was subsequently held.  INTERVAL HISTORY:  [name] 82 y.o. female with a history of polycythemia vera (04/21/2007) with positive JAK2 mutation is here for follow-up. She unfortunately had spontaneous left femur fracture in early April 2017, which required a surgery. She developed moderate anemia during her hospital stay, and received blood transfusion, was given iv iron and she took oral iron for 3 weeks (she is off now). She was discharged to home from rehab, and is able to walk with a walk now. She has home PT, and will change to outpt PT soon. No other complains.   MEDICAL HISTORY: Past Medical History  Diagnosis Date  . MVP (mitral valve prolapse)   . Hypertension   . Chest tightness   . Palpitation   . PVC's (premature ventricular contractions)   . Breast cancer ([place])     right  . Polycythemia   . Chronic gastritis   . Diverticulosis   . Candida esophagitis ([employer])   . Arthritis   . Unsteady gait   . Closed intertrochanteric fracture of left femur with routine healing   . Acute blood loss anemia   . IBS (irritable bowel syndrome)   . Rhinitis   . Atrophic gastritis     INTERIM HISTORY: has Malignant neoplasm of female breast ([place]); Polycythemia vera ([place]); Atrophic  gastritis; DIVERTICULOSIS OF COLON; ARTHRITIS; ABDOMINAL PAIN, EPIGASTRIC; HTN (hypertension); MVP (mitral valve prolapse); Closed left hip fracture ([place]); Leukocytosis; Fracture, intertrochanteric, left femur ([place]); Faintness; Acute blood loss anemia; Thrombocytosis ([place]); Dehydration; and Osteoporosis on her problem list.    ALLERGIES:  is allergic to sulfur.  MEDICATIONS: has a current medication list which includes the following prescription(s): anagrelide, aspirin, bisacodyl, calcium carbonate-vitamin d, cetirizine hcl, vitamin d3, colestipol, fluticasone, hydrochlorothiazide, lisinopril, loperamide, metoprolol succinate, multivitamin, pantoprazole, potassium chloride, dicyclomine, hydrocodone-acetaminophen, and probiotic product.  SURGICAL HISTORY:  Past Surgical History  Procedure Laterality Date  . Tonsillectomy    . Appendectomy    . Cardiovascular stress test  12/23/2000    EF 70%  . Transthoracic echocardiogram  03/05/2010    EF 55-60%  . Abdominal hysterectomy      ovaries taken  . Breast lumpectomy      right  . Elbow fracture surgery      left  . Cataract extraction      bilateral  . Hematoma evacuation      right  . Femur im nail Left 05/08/2015    Procedure: INTERTROCHANTERIC FEMORAL NAIL;  Surgeon: Rod Can, MD;  Location: [facility];  Service: Orthopedics;  Laterality: Left;   PROBLEM LIST:  1. Polycythemia vera diagnosed in March 2009. JAK2 mutation was detected on 04/21/2007. Hemoglobin in April 2009 was 18.4 with hematocrit of 55.0. Platelet count on July 12, 2007, was 868,000. The patient has never had a bone marrow exam. She had  been undergoing phlebotomies in the past, approximately once a month, for hematocrit greater than or equal to 45%. The patient has been on anagrelide 0.5 mg twice daily, as well as aspirin. She has not had any thrombotic complications. We will be carrying out a 1-unit phlebotomy whenever the hematocrit is greater than or equal to 42%. The  patient does require normal saline 300 mL after phlebotomy because of symptomatic hypotension.  2. 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  Lumpectomy on 01/04/2006 followed by radiation treatments under the direction of Dr. Gery Pray. The patient has not required any systemic therapy. She does have yearly mammograms.  3. Mitral valve prolapse.  4. Hypertension.  5. Diverticulosis.  6. History of esophageal stricture.  7. History of gastritis.  8. History of pelvic fractures after falling on August 23, 2006.  REVIEW OF SYSTEMS:   Constitutional: Denies fevers, chills or abnormal weight loss Eyes: Denies blurriness of vision Ears, nose, mouth, throat, and face: Denies mucositis or sore throat Respiratory: Denies cough, dyspnea or wheezes Cardiovascular: Denies palpitation, chest discomfort or lower extremity swelling Gastrointestinal:  Denies nausea, heartburn or change in bowel habits Skin: Denies abnormal skin rashes Lymphatics: Denies new lymphadenopathy or easy bruising Neurological:Denies numbness, tingling or new weaknesses Behavioral/Psych: Mood is stable, no new changes  All other systems were reviewed with the patient and are negative.  PHYSICAL EXAMINATION: ECOG PERFORMANCE STATUS: 0  Blood pressure 161/71, pulse 90, temperature 97.5 F (36.4 C), temperature source Oral, resp. rate 18, height 5\' 3"  (1.6 m), weight 93 lb 11.2 oz (42.502 kg), SpO2 97 %.  GENERAL:alert, no distress and comfortable; she has scoliosis and looks her stated age.  SKIN: skin color, texture, turgor are normal, no rashes but seborrheic keratosis.  EYES: normal, Conjunctiva are pink and non-injected, sclera clear OROPHARYNX:no exudate, no erythema and lips, buccal mucosa, and tongue normal  NECK: supple, thyroid normal size, non-tender, without nodularity LYMPH:  no palpable lymphadenopathy in the cervical, axillary or supraclavicular LUNGS: clear to auscultation and percussion  with normal breathing effort HEART: regular rate & rhythm and no murmurs and no lower extremity edema ABDOMEN:abdomen soft, non-tender and normal bowel sounds Musculoskeletal: (+) Mild tenderness at the right hip and pelvic area. The range of motion of right hip is normal. NEURO: alert & oriented x 3 with fluent speech, no focal motor/sensory deficits Breasts: Breast inspection showed them to be symmetrical with no nipple discharge. Status post right lumpectomy change.  Palpation of the breasts and axilla revealed no obvious mass that I could appreciate.   LABORATORY DATA: CBC Latest Ref Rng 07/01/2015 06/11/2015 05/15/2015  WBC 3.9 - 10.3 10e3/uL 10.9(H) 14.1(H) 17.5  Hemoglobin 11.6 - 15.9 g/dL 17.8(H) 16.1(H) 10.2(A)  Hematocrit 34.8 - 46.6 % 53.9(H) 49.9(H) 34(A)  Platelets 145 - 400 10e3/uL 322 463(H) 565(A)    CMP Latest Ref Rng 06/11/2015 05/15/2015 05/12/2015  Glucose 70 - 140 mg/dl 149(H) - 103(H)  BUN 7.0 - 26.0 mg/dL 16.1 18 12   Creatinine 0.6 - 1.1 mg/dL 1.0 0.8 0.81  Sodium 136 - 145 mEq/L 127(L) 131(A) 132(L)  Potassium 3.5 - 5.1 mEq/L 4.2 4.4 4.0  Chloride 101 - 111 mmol/L - - 103  CO2 22 - 29 mEq/L 25 - 23  Calcium 8.4 - 10.4 mg/dL 9.9 - 8.1(L)  Total Protein 6.4 - 8.3 g/dL 6.4 - -  Total Bilirubin 0.20 - 1.20 mg/dL 0.60 - -  Alkaline Phos 40 - 150 U/L 160(H) 80 -  AST 5 - 34 U/L 16 40(A) -  ALT 0 - 55 U/L 12 38(A) -     IMAGING STUDIES:  1. Ultrasound of the abdomen, limited, on 01/21/2006, showed a splenic volume of 73 cc with maximum ultrasound dimension of 8.8 cm. Spleen measured 8.8 x 4.1 x 3.9 cm, for a volume of 73 cc. No focal lesions were identified.  2. Diagnostic bilateral digital mammogram carried out at [facility] on 01/01/2011 showed no suspicious findings.  3. Mammogram, diagnostic, bilateral on 01/04/2012 showed no significant abnormalities. There were post lumpectomy changes present involving the right breast.   ASSESSMENT: [name] 82  y.o. female with a history of Polycythemia vera ([place])  Malignant neoplasm of lower-outer quadrant of right female breast ([place])  Osteoporosis   PLAN:  1. Myeloproliferative neoplasm, Polycythemia vera, JAK2(+)  --The patient is doing well clinically. -Her hematocrit is 46% today, she is agreeable to have phlebotomy with half unit blood, and I will give her her to 50 mL normal saline today - she increased her anagrelide in 03/2014, platelet count has been gradually coming down to normal, 405K today.   -We'll continue anagrelide at 1 mg in the morning and 0.5 mg in the evening, and watch her CBC closely. -She previously did not tolerate Hydrea due to thrombocytopenia. -We discussed this is a incurable disease, most people have indolent and benign course, the major complication is thrombosis. Some patients may develop leukemia and myelofibrosis at the end. -She is clinically doing well, unfortunately she received oral and iv iron supplement, and her hemoglobin has increased significantly. Hematocrit 54% today -I recommend her to have a phlebotomy with 3/4 units today, and repeat in 3 and 6 weeks. Due to her advanced age, I'll give her normal saline 400 mL after phlebotomy. -Her platelet count has been normal now, we'll continue anagrelide at the current dose -Continue baby aspirin daily  2. History of right breast DCIS  --Involving the right breast with biopsy on 11/29/2005.  Lumpectomy on 01/04/2006 followed by radiation treatments under the direction of Dr. Gery Pray.  -She is still on annual screening mammogram surveillance, last one this year was normal per pt.  3. L2 compression fracture in 04/2014, left femoral fracture in April 2017, osteoporosis -Her pain and the morbility has improved much after rehab  -Continue supportive care and symptom management by her primary care physician -She is on Prolia with her PCP, I encourage her to continue calcium and vitamin D 2 tablets a day    Plan -CBC every 3 weeks with phlebotomy 3/4 unit blood if HCT>45%, with normal saline 400 mL after phlebotomy.  -I'll see her back in 6 weeks  All questions were answered. The patient knows to call the clinic with any problems, questions or concerns. We can certainly see the patient much sooner if necessary.  Patient was provided a handout on polycythemia vera.   I spent 20 minutes counseling the patient face to face. The total time spent in the appointment was 25 minutes.   Truitt Merle  07/01/2015

## 2015-07-01 NOTE — Patient Instructions (Signed)

## 2015-07-02 ENCOUNTER — Telehealth: Payer: Self-pay | Admitting: Hematology

## 2015-07-02 DIAGNOSIS — S72142D Displaced intertrochanteric fracture of left femur, subsequent encounter for closed fracture with routine healing: Secondary | ICD-10-CM | POA: Diagnosis not present

## 2015-07-02 DIAGNOSIS — I341 Nonrheumatic mitral (valve) prolapse: Secondary | ICD-10-CM | POA: Diagnosis not present

## 2015-07-02 DIAGNOSIS — W108XXD Fall (on) (from) other stairs and steps, subsequent encounter: Secondary | ICD-10-CM | POA: Diagnosis not present

## 2015-07-02 DIAGNOSIS — D45 Polycythemia vera: Secondary | ICD-10-CM | POA: Diagnosis not present

## 2015-07-02 DIAGNOSIS — M6281 Muscle weakness (generalized): Secondary | ICD-10-CM | POA: Diagnosis not present

## 2015-07-02 DIAGNOSIS — I10 Essential (primary) hypertension: Secondary | ICD-10-CM | POA: Diagnosis not present

## 2015-07-02 NOTE — Telephone Encounter (Signed)
per pof to sch pt appt-sent MW email to [facility] phlebotomy-[facility] all pt after reply

## 2015-07-03 ENCOUNTER — Telehealth: Payer: Self-pay | Admitting: *Deleted

## 2015-07-03 NOTE — Telephone Encounter (Signed)
"  I left Tuesday without scheduling my next phlebotomy appointments.  Can you help me with this?"  Checked appointments which were scheduled yesterday.  Provided appointment information.

## 2015-07-04 DIAGNOSIS — W108XXD Fall (on) (from) other stairs and steps, subsequent encounter: Secondary | ICD-10-CM | POA: Diagnosis not present

## 2015-07-04 DIAGNOSIS — M6281 Muscle weakness (generalized): Secondary | ICD-10-CM | POA: Diagnosis not present

## 2015-07-04 DIAGNOSIS — D45 Polycythemia vera: Secondary | ICD-10-CM | POA: Diagnosis not present

## 2015-07-04 DIAGNOSIS — I341 Nonrheumatic mitral (valve) prolapse: Secondary | ICD-10-CM | POA: Diagnosis not present

## 2015-07-04 DIAGNOSIS — S72142D Displaced intertrochanteric fracture of left femur, subsequent encounter for closed fracture with routine healing: Secondary | ICD-10-CM | POA: Diagnosis not present

## 2015-07-04 DIAGNOSIS — I10 Essential (primary) hypertension: Secondary | ICD-10-CM | POA: Diagnosis not present

## 2015-07-07 ENCOUNTER — Ambulatory Visit (INDEPENDENT_AMBULATORY_CARE_PROVIDER_SITE_OTHER): Payer: Medicare Other | Admitting: Cardiovascular Disease

## 2015-07-07 ENCOUNTER — Encounter: Payer: Self-pay | Admitting: Cardiovascular Disease

## 2015-07-07 VITALS — BP 140/70 | HR 80 | Ht 63.0 in | Wt 93.6 lb

## 2015-07-07 DIAGNOSIS — I341 Nonrheumatic mitral (valve) prolapse: Secondary | ICD-10-CM | POA: Diagnosis not present

## 2015-07-07 DIAGNOSIS — I10 Essential (primary) hypertension: Secondary | ICD-10-CM | POA: Diagnosis not present

## 2015-07-07 NOTE — Patient Instructions (Signed)

## 2015-07-07 NOTE — Progress Notes (Signed)
[name] Date of Birth  [birth year] [name] [address]    [name], [place]  [address]    [place], [place]  [address] [phone]  Fax  [phone]  [phone]  Fax [phone]   History of Present Illness:  Problem list: 1. Mitral valve prolapse 2. Breast cancer 3. Polycythemia 4. Hypertension  [name] is an 82 year old female with a history of mitral valve prolapse, hypertension, and polycythemia. She has phlebotomy  about once a month for her polycythemia. Her blood pressure typically is little bit elevated and then goes down quickly after her phlebotomy. She's feeling quite well. She's exercising regularly.  [name]:  No CP.  She has mild  Dyspnea.  She is still getting phlebotomy regularly.  She has some mild dyspnea when she first lies down but it typically resolves and she doe not have to sleep on any pillows.  She is able to do her normal activities without problems.   [name]:  [name] is doing ok.  Not walking as much as she used to.  Stays active. h Has lots of cramps in her feet.   [name]:  [name] is an 82 yo who we follow for HTN and MVP.  She also has polycythemia and gets phlebotomy once a month.  Staying moderately active.   Gets fatigued easy She had labs a month ago  [name]:   [name] is doing ok from a cardiac standpoint . Broke her back this past march .  No CP ,  Occasional dyspnea.  Has polycythemia vera.  Had a phlebotomy last week .   [name]:  Has had a hip fracture in April.  Has had it repaired.  Still walking with a walker   Current Outpatient Prescriptions on File Prior to Visit  Medication Sig Dispense Refill  . anagrelide (AGRYLIN) 0.5 MG capsule TAKE 2 CAPSULES IN THE MORNING AND 1 CAPSULE IN THE AFTERNOON. 90 capsule 0  . aspirin 81 MG tablet Take 81 mg by mouth daily.      . bisacodyl (DULCOLAX) 10 MG suppository Place 10 mg rectally  daily as needed for moderate constipation. Reported on 5/10/Rojas    . Calcium Carbonate-Vitamin D (CALCIUM 500/D PO) Take 1 capsule by mouth daily.    . Cetirizine HCl (ZYRTEC PO) Take 1 tablet by mouth as needed (AS NEEDED FOR ALLERGIES).     . Cholecalciferol (VITAMIN D3) 1000 UNITS CAPS Take 2,000 mg by mouth 2 (two) times daily.     . colestipol (COLESTID) 1 g tablet Take 1 g by mouth daily. Reported on 4/12/Rojas    . fluticasone (FLONASE) 50 MCG/ACT nasal spray Place 2 sprays into the nose as needed for allergies or rhinitis. Reported on 5/10/Rojas    . hydrochlorothiazide (HYDRODIURIL) 12.5 MG tablet Take 12.5 mg by mouth daily.    Marland Kitchen lisinopril (PRINIVIL,ZESTRIL) 40 MG tablet Take 20 mg by mouth daily.    Marland Kitchen loperamide (IMODIUM) 1 MG/5ML solution Take 5 mLs (1 mg total) by mouth as needed for diarrhea or loose stools.    . metoprolol succinate (TOPROL-XL) 50 MG 24 hr tablet Take 1 tablet (50 mg total) by mouth daily. 90 tablet 2  . Multiple Vitamin (MULTIVITAMIN) tablet Take 1 tablet by mouth daily.      . pantoprazole (PROTONIX) 40 MG tablet Take 40 mg by mouth as needed (AS  NEEDED FOR STOMACH).     Marland Kitchen potassium chloride (K-DUR) 10 MEQ tablet Take 1 tablet (10 mEq total) by mouth daily. 30 tablet 11  . Probiotic Product (ULTRAFLORA IMMUNE HEALTH PO) Take 1 tablet by mouth daily. Reported on 5/30/Rojas     No current facility-administered medications on file prior to visit.    Allergies  Allergen Reactions  . Sulfur Other (See Comments)    Pt doesn't remember reaction.    Past Medical History  Diagnosis Date  . MVP (mitral valve prolapse)   . Hypertension   . Chest tightness   . Palpitation   . PVC's (premature ventricular contractions)   . Breast cancer ([place])     right  . Polycythemia   . Chronic gastritis   . Diverticulosis   . Candida esophagitis ([place])   . Arthritis   . Unsteady gait   . Closed intertrochanteric fracture of left femur with routine healing   . Acute blood  loss anemia   . IBS (irritable bowel syndrome)   . Rhinitis   . Atrophic gastritis     Past Surgical History  Procedure Laterality Date  . Tonsillectomy    . Appendectomy    . Cardiovascular stress test  12/23/2000    EF 70%  . Transthoracic echocardiogram  03/05/2010    EF 55-60%  . Abdominal hysterectomy      ovaries taken  . Breast lumpectomy      right  . Elbow fracture surgery      left  . Cataract extraction      bilateral  . Hematoma evacuation      right  . Femur im nail Left 4/6/Rojas    Procedure: INTERTROCHANTERIC FEMORAL NAIL;  Surgeon: Rod Can, MD;  Location: [facility];  Service: Orthopedics;  Laterality: Left;    History  Smoking status  . Former Smoker  . Quit date: 08/24/1980  Smokeless tobacco  . Never Used    History  Alcohol Use  . Yes    Comment: occasional    Family History  Problem Relation Age of Onset  . Stomach cancer Mother   . Hypertension Father   . Stroke Father   . Hypertension Sister   . Hypertension Brother   . Prostate cancer Brother   . Colon cancer Neg Hx   . Prostate cancer Father   . Diabetes Brother     Reviw of Systems:  Reviewed in the HPI.  All other systems are negative.  Physical Exam: BP 140/70 mmHg  Pulse 80  Ht 5\' 3"  (1.6 m)  Wt 93 lb 9.6 oz (42.457 kg)  BMI 16.58 kg/m2 The patient is alert and oriented x 3.  The mood and affect are normal.   Skin: warm and dry.  Color is normal.   HEENT:   Neck is supple. His membranes are moist. The carotids are normal. No JVD. Lungs: Lungs are clear.  Heart: Regular rate S1-S2.  She is a soft systolic murmur. Abdomen: She is thin. She has good bowel sounds. There are no bruits  Extremities:  No clubbing cyanosis or edema Neuro:  Nonfocal.    ECG: [name]:  NSR at 97.   No ST or T wave changes.   Assessment / Plan:   1. Mitral valve prolapse- stable 2. Breast cancer 3. Polycythemia 4. Hypertension - blood pressure is well-controlled. Continue current  medications.   I'll see her again in one year. 5. Left hip fracture :   Walking  with a walker.     Mertie Moores, MD  6/5/Rojas 3:39 PM    [address] [place],  [place], [place]  [address] Pager [phone] Phone: [phone]; Fax: [phone]   [facility]  [address] [place], [place]  [address] [phone]   Fax [phone]

## 2015-07-08 DIAGNOSIS — I341 Nonrheumatic mitral (valve) prolapse: Secondary | ICD-10-CM | POA: Diagnosis not present

## 2015-07-08 DIAGNOSIS — M6281 Muscle weakness (generalized): Secondary | ICD-10-CM | POA: Diagnosis not present

## 2015-07-08 DIAGNOSIS — W108XXD Fall (on) (from) other stairs and steps, subsequent encounter: Secondary | ICD-10-CM | POA: Diagnosis not present

## 2015-07-08 DIAGNOSIS — S72142D Displaced intertrochanteric fracture of left femur, subsequent encounter for closed fracture with routine healing: Secondary | ICD-10-CM | POA: Diagnosis not present

## 2015-07-08 DIAGNOSIS — D45 Polycythemia vera: Secondary | ICD-10-CM | POA: Diagnosis not present

## 2015-07-08 DIAGNOSIS — I10 Essential (primary) hypertension: Secondary | ICD-10-CM | POA: Diagnosis not present

## 2015-07-10 DIAGNOSIS — I10 Essential (primary) hypertension: Secondary | ICD-10-CM | POA: Diagnosis not present

## 2015-07-10 DIAGNOSIS — D45 Polycythemia vera: Secondary | ICD-10-CM | POA: Diagnosis not present

## 2015-07-10 DIAGNOSIS — I341 Nonrheumatic mitral (valve) prolapse: Secondary | ICD-10-CM | POA: Diagnosis not present

## 2015-07-10 DIAGNOSIS — M6281 Muscle weakness (generalized): Secondary | ICD-10-CM | POA: Diagnosis not present

## 2015-07-10 DIAGNOSIS — W108XXD Fall (on) (from) other stairs and steps, subsequent encounter: Secondary | ICD-10-CM | POA: Diagnosis not present

## 2015-07-10 DIAGNOSIS — S72142D Displaced intertrochanteric fracture of left femur, subsequent encounter for closed fracture with routine healing: Secondary | ICD-10-CM | POA: Diagnosis not present

## 2015-07-14 ENCOUNTER — Other Ambulatory Visit: Payer: Self-pay | Admitting: Hematology

## 2015-07-23 ENCOUNTER — Other Ambulatory Visit (HOSPITAL_BASED_OUTPATIENT_CLINIC_OR_DEPARTMENT_OTHER): Payer: Medicare Other

## 2015-07-23 ENCOUNTER — Ambulatory Visit (HOSPITAL_BASED_OUTPATIENT_CLINIC_OR_DEPARTMENT_OTHER): Payer: Medicare Other

## 2015-07-23 VITALS — BP 152/81 | HR 87 | Temp 97.8°F | Resp 16

## 2015-07-23 DIAGNOSIS — D751 Secondary polycythemia: Secondary | ICD-10-CM

## 2015-07-23 DIAGNOSIS — D45 Polycythemia vera: Secondary | ICD-10-CM

## 2015-07-23 LAB — CBC WITH DIFFERENTIAL/PLATELET
BASO%: 0.3 % (ref 0.0–2.0)
Basophils Absolute: 0 10*3/uL (ref 0.0–0.1)
EOS%: 1.6 % (ref 0.0–7.0)
Eosinophils Absolute: 0.2 10*3/uL (ref 0.0–0.5)
HCT: 53.8 % — ABNORMAL HIGH (ref 34.8–46.6)
HEMOGLOBIN: 17.2 g/dL — AB (ref 11.6–15.9)
LYMPH%: 6.6 % — AB (ref 14.0–49.7)
MCH: 26.2 pg (ref 25.1–34.0)
MCHC: 32 g/dL (ref 31.5–36.0)
MCV: 81.9 fL (ref 79.5–101.0)
MONO#: 0.8 10*3/uL (ref 0.1–0.9)
MONO%: 6.5 % (ref 0.0–14.0)
NEUT%: 85 % — ABNORMAL HIGH (ref 38.4–76.8)
NEUTROS ABS: 10 10*3/uL — AB (ref 1.5–6.5)
Platelets: 462 10*3/uL — ABNORMAL HIGH (ref 145–400)
RBC: 6.57 10*6/uL — AB (ref 3.70–5.45)
RDW: 21.4 % — AB (ref 11.2–14.5)
WBC: 11.7 10*3/uL — AB (ref 3.9–10.3)
lymph#: 0.8 10*3/uL — ABNORMAL LOW (ref 0.9–3.3)

## 2015-07-23 MED ORDER — SODIUM CHLORIDE 0.9 % IV SOLN
Freq: Once | INTRAVENOUS | Status: AC
  Administered 2015-07-23: 11:00:00 via INTRAVENOUS

## 2015-07-23 NOTE — Progress Notes (Signed)
1155- Pt phlebotomy performed today using 16g needle, over 5-10 minutes without any issues. Pt hct 53.8, and phlebotomy done per Dr. Burr Medico parameters. Pt also received additional iv hydration of 474ml ns during 30 min post observation. VSS upon discharge.

## 2015-07-23 NOTE — Patient Instructions (Signed)

## 2015-07-24 DIAGNOSIS — I341 Nonrheumatic mitral (valve) prolapse: Secondary | ICD-10-CM | POA: Diagnosis not present

## 2015-07-24 DIAGNOSIS — M6281 Muscle weakness (generalized): Secondary | ICD-10-CM | POA: Diagnosis not present

## 2015-07-24 DIAGNOSIS — S72142D Displaced intertrochanteric fracture of left femur, subsequent encounter for closed fracture with routine healing: Secondary | ICD-10-CM | POA: Diagnosis not present

## 2015-08-06 DIAGNOSIS — M6281 Muscle weakness (generalized): Secondary | ICD-10-CM | POA: Diagnosis not present

## 2015-08-06 DIAGNOSIS — S72142D Displaced intertrochanteric fracture of left femur, subsequent encounter for closed fracture with routine healing: Secondary | ICD-10-CM | POA: Diagnosis not present

## 2015-08-06 DIAGNOSIS — W108XXD Fall (on) (from) other stairs and steps, subsequent encounter: Secondary | ICD-10-CM | POA: Diagnosis not present

## 2015-08-06 DIAGNOSIS — I341 Nonrheumatic mitral (valve) prolapse: Secondary | ICD-10-CM | POA: Diagnosis not present

## 2015-08-13 ENCOUNTER — Other Ambulatory Visit (HOSPITAL_BASED_OUTPATIENT_CLINIC_OR_DEPARTMENT_OTHER): Payer: Medicare Other

## 2015-08-13 ENCOUNTER — Telehealth: Payer: Self-pay | Admitting: Hematology

## 2015-08-13 ENCOUNTER — Ambulatory Visit (HOSPITAL_BASED_OUTPATIENT_CLINIC_OR_DEPARTMENT_OTHER): Payer: Medicare Other | Admitting: Hematology

## 2015-08-13 ENCOUNTER — Encounter: Payer: Self-pay | Admitting: Hematology

## 2015-08-13 ENCOUNTER — Ambulatory Visit (HOSPITAL_BASED_OUTPATIENT_CLINIC_OR_DEPARTMENT_OTHER): Payer: Medicare Other

## 2015-08-13 VITALS — BP 147/62 | HR 69 | Temp 97.8°F | Resp 18 | Ht 63.0 in | Wt 93.5 lb

## 2015-08-13 VITALS — BP 166/61 | HR 70 | Temp 98.0°F | Resp 16

## 2015-08-13 DIAGNOSIS — D45 Polycythemia vera: Secondary | ICD-10-CM

## 2015-08-13 DIAGNOSIS — M810 Age-related osteoporosis without current pathological fracture: Secondary | ICD-10-CM | POA: Diagnosis not present

## 2015-08-13 DIAGNOSIS — D751 Secondary polycythemia: Secondary | ICD-10-CM

## 2015-08-13 DIAGNOSIS — Z86000 Personal history of in-situ neoplasm of breast: Secondary | ICD-10-CM

## 2015-08-13 DIAGNOSIS — Z853 Personal history of malignant neoplasm of breast: Secondary | ICD-10-CM

## 2015-08-13 LAB — COMPREHENSIVE METABOLIC PANEL
ALT: 12 U/L (ref 0–55)
AST: 17 U/L (ref 5–34)
Albumin: 3.6 g/dL (ref 3.5–5.0)
Alkaline Phosphatase: 123 U/L (ref 40–150)
Anion Gap: 8 mEq/L (ref 3–11)
BILIRUBIN TOTAL: 0.38 mg/dL (ref 0.20–1.20)
BUN: 18.9 mg/dL (ref 7.0–26.0)
CO2: 25 meq/L (ref 22–29)
Calcium: 9.9 mg/dL (ref 8.4–10.4)
Chloride: 101 mEq/L (ref 98–109)
Creatinine: 0.9 mg/dL (ref 0.6–1.1)
EGFR: 55 mL/min/{1.73_m2} — AB (ref >90)
GLUCOSE: 95 mg/dL (ref 70–140)
POTASSIUM: 4.9 meq/L (ref 3.5–5.1)
SODIUM: 134 meq/L — AB (ref 136–145)
TOTAL PROTEIN: 6.6 g/dL (ref 6.4–8.3)

## 2015-08-13 LAB — CBC WITH DIFFERENTIAL/PLATELET
BASO%: 0.6 % (ref 0.0–2.0)
Basophils Absolute: 0.1 10*3/uL (ref 0.0–0.1)
EOS ABS: 0.2 10*3/uL (ref 0.0–0.5)
EOS%: 1.5 % (ref 0.0–7.0)
HEMATOCRIT: 50 % — AB (ref 34.8–46.6)
HGB: 15.7 g/dL (ref 11.6–15.9)
LYMPH#: 0.9 10*3/uL (ref 0.9–3.3)
LYMPH%: 6.8 % — ABNORMAL LOW (ref 14.0–49.7)
MCH: 24.8 pg — ABNORMAL LOW (ref 25.1–34.0)
MCHC: 31.5 g/dL (ref 31.5–36.0)
MCV: 78.7 fL — AB (ref 79.5–101.0)
MONO#: 0.7 10*3/uL (ref 0.1–0.9)
MONO%: 5.8 % (ref 0.0–14.0)
NEUT%: 85.3 % — ABNORMAL HIGH (ref 38.4–76.8)
NEUTROS ABS: 10.9 10*3/uL — AB (ref 1.5–6.5)
PLATELETS: 464 10*3/uL — AB (ref 145–400)
RBC: 6.35 10*6/uL — ABNORMAL HIGH (ref 3.70–5.45)
RDW: 20.9 % — ABNORMAL HIGH (ref 11.2–14.5)
WBC: 12.8 10*3/uL — AB (ref 3.9–10.3)

## 2015-08-13 MED ORDER — SODIUM CHLORIDE 0.9 % IV SOLN
Freq: Once | INTRAVENOUS | Status: AC
  Administered 2015-08-13: 12:00:00 via INTRAVENOUS

## 2015-08-13 NOTE — Progress Notes (Signed)
[facility] PROGRESS NOTE DATE OF VISIT: 08/13/2015   Kelly Pel, MD [address] [place] [address]  DIAGNOSIS: No diagnosis found., Breast Cancer   PREVIOUS AND CURRENT THERAPY:  1. Phlebotomy if hematocrit is greater than or equal to 42% (changed to 45% in May 2016). The patient receives 300-400 mL of normal saline following each 2/1-1-unit phlebotomy  2. Anagrelide 0.5 mg twice daily and aspirin 81 mg daily, anagrelide increased to 1.0mg  AM and 0.5mg  PM since Feb 2016  3. She previously tried Hydrea 500mg  daily, developed thrombocytopenia, was subsequently held.  INTERVAL HISTORY:  [name] 82 y.o. female with a history of polycythemia vera (04/21/2007) with positive JAK2 mutation is here for follow-up. She is doing well overall, she has recovered well from her femur fracture 3 months ago. Her appetite is moderate, but she eats fairly well. Her weight has been stable. No other new complains.  MEDICAL HISTORY: Past Medical History  Diagnosis Date  . MVP (mitral valve prolapse)   . Hypertension   . Chest tightness   . Palpitation   . PVC's (premature ventricular contractions)   . Breast cancer ([place])     right  . Polycythemia   . Chronic gastritis   . Diverticulosis   . Candida esophagitis ([place])   . Arthritis   . Unsteady gait   . Closed intertrochanteric fracture of left femur with routine healing   . Acute blood loss anemia   . IBS (irritable bowel syndrome)   . Rhinitis   . Atrophic gastritis     INTERIM HISTORY: has Malignant neoplasm of female breast ([place]); Polycythemia vera ([place]); Atrophic gastritis; DIVERTICULOSIS OF COLON; ARTHRITIS; ABDOMINAL PAIN, EPIGASTRIC; HTN (hypertension); MVP (mitral valve prolapse); Closed left hip fracture ([place]); Leukocytosis; Fracture, intertrochanteric, left femur ([place]); Faintness; Acute blood loss anemia; Thrombocytosis ([place]); Dehydration; and Osteoporosis on her problem  list.    ALLERGIES:  is allergic to sulfur.  MEDICATIONS: has a current medication list which includes the following prescription(s): anagrelide, aspirin, bisacodyl, calcium carbonate-vitamin d, cetirizine hcl, vitamin d3, colestipol, fluticasone, hydrochlorothiazide, lisinopril, loperamide, metoprolol succinate, multivitamin, pantoprazole, potassium chloride, and probiotic product.  SURGICAL HISTORY:  Past Surgical History  Procedure Laterality Date  . Tonsillectomy    . Appendectomy    . Cardiovascular stress test  12/23/2000    EF 70%  . Transthoracic echocardiogram  03/05/2010    EF 55-60%  . Abdominal hysterectomy      ovaries taken  . Breast lumpectomy      right  . Elbow fracture surgery      left  . Cataract extraction      bilateral  . Hematoma evacuation      right  . Femur im nail Left 05/08/2015    Procedure: INTERTROCHANTERIC FEMORAL NAIL;  Surgeon: Rod Can, MD;  Location: [facility];  Service: Orthopedics;  Laterality: Left;   PROBLEM LIST:  1. Polycythemia vera diagnosed in March 2009. JAK2 mutation was detected on 04/21/2007. Hemoglobin in April 2009 was 18.4 with hematocrit of 55.0. Platelet count on July 12, 2007, was 868,000. The patient has never had a bone marrow exam. She had been undergoing phlebotomies in the past, approximately once a month, for hematocrit greater than or equal to 45%. The patient has been on anagrelide 0.5 mg twice daily, as well as aspirin. She has not had any thrombotic complications. We will be carrying out a 1-unit phlebotomy whenever the hematocrit is greater than or equal  to 42%. The patient does require normal saline 300 mL after phlebotomy because of symptomatic hypotension.  2. 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  Lumpectomy on 01/04/2006 followed by radiation treatments under the direction of Dr. Gery Pray. The patient has not required any systemic therapy. She does have yearly mammograms.  3. Mitral valve  prolapse.  4. Hypertension.  5. Diverticulosis.  6. History of esophageal stricture.  7. History of gastritis.  8. History of pelvic fractures after falling on August 23, 2006.  REVIEW OF SYSTEMS:   Constitutional: Denies fevers, chills or abnormal weight loss Eyes: Denies blurriness of vision Ears, nose, mouth, throat, and face: Denies mucositis or sore throat Respiratory: Denies cough, dyspnea or wheezes Cardiovascular: Denies palpitation, chest discomfort or lower extremity swelling Gastrointestinal:  Denies nausea, heartburn or change in bowel habits Skin: Denies abnormal skin rashes Lymphatics: Denies new lymphadenopathy or easy bruising Neurological:Denies numbness, tingling or new weaknesses Behavioral/Psych: Mood is stable, no new changes  All other systems were reviewed with the patient and are negative.  PHYSICAL EXAMINATION: ECOG PERFORMANCE STATUS: 0  Blood pressure 147/62, pulse 69, temperature 97.8 F (36.6 C), temperature source Oral, resp. rate 18, height 5\' 3"  (1.6 m), weight 93 lb 8 oz (42.411 kg), SpO2 97 %.  GENERAL:alert, no distress and comfortable; she has scoliosis and looks her stated age.  SKIN: skin color, texture, turgor are normal, no rashes but seborrheic keratosis.  EYES: normal, Conjunctiva are pink and non-injected, sclera clear OROPHARYNX:no exudate, no erythema and lips, buccal mucosa, and tongue normal  NECK: supple, thyroid normal size, non-tender, without nodularity LYMPH:  no palpable lymphadenopathy in the cervical, axillary or supraclavicular LUNGS: clear to auscultation and percussion with normal breathing effort HEART: regular rate & rhythm and no murmurs and no lower extremity edema ABDOMEN:abdomen soft, non-tender and normal bowel sounds Musculoskeletal: (+) Mild tenderness at the right hip and pelvic area. The range of motion of right hip is normal. NEURO: alert & oriented x 3 with fluent speech, no focal motor/sensory deficits Breasts:  Breast inspection showed them to be symmetrical with no nipple discharge. Status post right lumpectomy change.  Palpation of the breasts and axilla revealed no obvious mass that I could appreciate.   LABORATORY DATA: CBC Latest Ref Rng 08/13/2015 07/23/2015 07/01/2015  WBC 3.9 - 10.3 10e3/uL 12.8(H) 11.7(H) 10.9(H)  Hemoglobin 11.6 - 15.9 g/dL 15.7 17.2(H) 17.8(H)  Hematocrit 34.8 - 46.6 % 50.0(H) 53.8(H) 53.9(H)  Platelets 145 - 400 10e3/uL 464(H) 462(H) 322    CMP Latest Ref Rng 08/13/2015 06/11/2015 05/15/2015  Glucose 70 - 140 mg/dl 95 149(H) -  BUN 7.0 - 26.0 mg/dL 18.9 16.1 18  Creatinine 0.6 - 1.1 mg/dL 0.9 1.0 0.8  Sodium 136 - 145 mEq/L 134(L) 127(L) 131(A)  Potassium 3.5 - 5.1 mEq/L 4.9 4.2 4.4  Chloride 101 - 111 mmol/L - - -  CO2 22 - 29 mEq/L 25 25 -  Calcium 8.4 - 10.4 mg/dL 9.9 9.9 -  Total Protein 6.4 - 8.3 g/dL 6.6 6.4 -  Total Bilirubin 0.20 - 1.20 mg/dL 0.38 0.60 -  Alkaline Phos 40 - 150 U/L 123 160(H) 80  AST 5 - 34 U/L 17 16 40(A)  ALT 0 - 55 U/L 12 12 38(A)     IMAGING STUDIES:  1. Ultrasound of the abdomen, limited, on 01/21/2006, showed a splenic volume of 73 cc with maximum ultrasound dimension of 8.8 cm. Spleen measured 8.8 x 4.1 x 3.9 cm, for  a volume of 73 cc. No focal lesions were identified.  2. Diagnostic bilateral digital mammogram carried out at [facility] on 01/01/2011 showed no suspicious findings.  3. Mammogram, diagnostic, bilateral on 01/04/2012 showed no significant abnormalities. There were post lumpectomy changes present involving the right breast.   ASSESSMENT: Filbert Berthold Yetman 82 y.o. female with a history of No diagnosis found.   PLAN:  1. Myeloproliferative neoplasm, Polycythemia vera, JAK2(+)  --The patient is doing well clinically. -Her H/H significantly increased after iv iron during her femur fracture, improved after phlebotomy  - she increased her anagrelide in 03/2014, platelet count has been gradually controlled, 462K  today.   -We'll continue anagrelide at 1 mg in the morning and 0.5 mg in the evening, and watch her CBC closely. -She previously did not tolerate Hydrea due to thrombocytopenia. -We discussed this is a incurable disease, most people have indolent and benign course, the major complication is thrombosis. Some patients may develop leukemia and myelofibrosis at the end. -She is clinically doing well, unfortunately she received oral and iv iron supplement, and her hemoglobin has increased significantly. Hematocrit 54% today -I recommend her to have a phlebotomy with 3/4 units today, and repeat monthly. Due to her advanced age, I'll give her normal saline 400 mL after phlebotomy. -Continue baby aspirin daily  2. History of right breast DCIS  --Involving the right breast with biopsy on 11/29/2005.  Lumpectomy on 01/04/2006 followed by radiation treatments under the direction of Dr. Gery Pray.  -She is still on annual screening mammogram surveillance, last one this year was normal per pt.  3. L2 compression fracture in 04/2014, left femoral fracture in April 2017, osteoporosis -Her pain and the morbility has improved much after rehab  -Continue supportive care and symptom management by her primary care physician -She is on Prolia with her PCP, I encourage her to continue calcium and vitamin D 2 tablets a day   Plan -CBC every 4 weeks with phlebotomy 3/4 unit blood if HCT>45%, with normal saline 400 mL after phlebotomy.  -I'll see her back in 4 months  All questions were answered. The patient knows to call the clinic with any problems, questions or concerns. We can certainly see the patient much sooner if necessary.  Patient was provided a handout on polycythemia vera.   I spent 15 minutes counseling the patient face to face. The total time spent in the appointment was 20 minutes.   Truitt Merle  08/13/2015

## 2015-08-13 NOTE — Patient Instructions (Signed)

## 2015-08-13 NOTE — Progress Notes (Signed)
375 mL phlebotomy performed using a 18g IV over 15 minutes. Patient tolerated well. 400 mL of Normal Saline infusing over 1 hour. Nourishment provided.

## 2015-08-13 NOTE — Telephone Encounter (Signed)
Gave patient avs report and appointments for August thru November.  °

## 2015-08-18 ENCOUNTER — Other Ambulatory Visit: Payer: Self-pay | Admitting: Hematology

## 2015-08-19 DIAGNOSIS — E559 Vitamin D deficiency, unspecified: Secondary | ICD-10-CM | POA: Diagnosis not present

## 2015-08-19 DIAGNOSIS — Z0001 Encounter for general adult medical examination with abnormal findings: Secondary | ICD-10-CM | POA: Diagnosis not present

## 2015-08-19 DIAGNOSIS — D751 Secondary polycythemia: Secondary | ICD-10-CM | POA: Diagnosis not present

## 2015-08-19 DIAGNOSIS — M810 Age-related osteoporosis without current pathological fracture: Secondary | ICD-10-CM | POA: Diagnosis not present

## 2015-08-19 DIAGNOSIS — I10 Essential (primary) hypertension: Secondary | ICD-10-CM | POA: Diagnosis not present

## 2015-08-19 DIAGNOSIS — Z0000 Encounter for general adult medical examination without abnormal findings: Secondary | ICD-10-CM | POA: Diagnosis not present

## 2015-08-22 ENCOUNTER — Other Ambulatory Visit: Payer: Self-pay | Admitting: *Deleted

## 2015-08-22 DIAGNOSIS — D45 Polycythemia vera: Secondary | ICD-10-CM

## 2015-08-22 MED ORDER — ANAGRELIDE HCL 0.5 MG PO CAPS: 0.5000 mg | ORAL_CAPSULE | ORAL | Status: DC

## 2015-08-25 DIAGNOSIS — M810 Age-related osteoporosis without current pathological fracture: Secondary | ICD-10-CM | POA: Diagnosis not present

## 2015-09-02 DIAGNOSIS — M810 Age-related osteoporosis without current pathological fracture: Secondary | ICD-10-CM | POA: Diagnosis not present

## 2015-09-10 ENCOUNTER — Ambulatory Visit (HOSPITAL_BASED_OUTPATIENT_CLINIC_OR_DEPARTMENT_OTHER): Payer: Medicare Other

## 2015-09-10 ENCOUNTER — Other Ambulatory Visit: Payer: Self-pay | Admitting: Hematology

## 2015-09-10 ENCOUNTER — Other Ambulatory Visit (HOSPITAL_BASED_OUTPATIENT_CLINIC_OR_DEPARTMENT_OTHER): Payer: Medicare Other

## 2015-09-10 VITALS — BP 172/62 | HR 72 | Temp 97.9°F

## 2015-09-10 DIAGNOSIS — D45 Polycythemia vera: Secondary | ICD-10-CM

## 2015-09-10 DIAGNOSIS — D751 Secondary polycythemia: Secondary | ICD-10-CM

## 2015-09-10 LAB — CBC WITH DIFFERENTIAL/PLATELET
BASO%: 0.2 % (ref 0.0–2.0)
Basophils Absolute: 0 10*3/uL (ref 0.0–0.1)
EOS%: 1.6 % (ref 0.0–7.0)
Eosinophils Absolute: 0.2 10*3/uL (ref 0.0–0.5)
HEMATOCRIT: 46.8 % — AB (ref 34.8–46.6)
HEMOGLOBIN: 14.5 g/dL (ref 11.6–15.9)
LYMPH#: 0.8 10*3/uL — AB (ref 0.9–3.3)
LYMPH%: 6.7 % — ABNORMAL LOW (ref 14.0–49.7)
MCH: 22.9 pg — ABNORMAL LOW (ref 25.1–34.0)
MCHC: 30.9 g/dL — AB (ref 31.5–36.0)
MCV: 74.1 fL — ABNORMAL LOW (ref 79.5–101.0)
MONO#: 0.7 10*3/uL (ref 0.1–0.9)
MONO%: 5.3 % (ref 0.0–14.0)
NEUT#: 10.6 10*3/uL — ABNORMAL HIGH (ref 1.5–6.5)
NEUT%: 86.2 % — ABNORMAL HIGH (ref 38.4–76.8)
Platelets: 479 10*3/uL — ABNORMAL HIGH (ref 145–400)
RBC: 6.32 10*6/uL — AB (ref 3.70–5.45)
RDW: 19.6 % — AB (ref 11.2–14.5)
WBC: 12.3 10*3/uL — ABNORMAL HIGH (ref 3.9–10.3)

## 2015-09-10 MED ORDER — SODIUM CHLORIDE 0.9 % IV SOLN
Freq: Once | INTRAVENOUS | Status: AC
Start: 2015-09-10 — End: 2015-09-10
  Administered 2015-09-10: 11:00:00 via INTRAVENOUS

## 2015-09-10 NOTE — Progress Notes (Signed)
Therapeutic Phlebotomy performed starting at 1014 with 16G in right AC, ended at 1029 yielding 383g. Pt tolerated well with no complaints. Beverage provided, pt declined snack.

## 2015-09-10 NOTE — Patient Instructions (Signed)
Therapeutic Phlebotomy, Care After  Refer to this sheet in the next few weeks. These instructions provide you with information about caring for yourself after your procedure. Your health care provider may also give you more specific instructions. Your treatment has been planned according to current medical practices, but problems sometimes occur. Call your health care provider if you have any problems or questions after your procedure.  WHAT TO EXPECT AFTER THE PROCEDURE  After your procedure, it is common to have:   Light-headedness or dizziness. You may feel faint.   Nausea.   Tiredness.  HOME CARE INSTRUCTIONS  Activities   Return to your normal activities as directed by your health care provider. Most people can go back to their normal activities right away.   Avoid strenuous physical activity and heavy lifting or pulling for about 5 hours after the procedure. Do not lift anything that is heavier than 10 lb (4.5 kg).   Athletes should avoid strenuous exercise for at least 12 hours.   Change positions slowly for the remainder of the day. This will help to prevent light-headedness or fainting.   If you feel light-headed, lie down until the feeling goes away.  Eating and Drinking   Be sure to eat well-balanced meals for the next 24 hours.   Drink enough fluid to keep your urine clear or pale yellow.   Avoid drinking alcohol on the day that you had the procedure.  Care of the Needle Insertion Site   Keep your bandage dry. You can remove the bandage after about 5 hours or as directed by your health care provider.   If you have bleeding from the needle insertion site, elevate your arm and press firmly on the site until the bleeding stops.   If you have bruising at the site, apply ice to the area:   Put ice in a plastic bag.   Place a towel between your skin and the bag.   Leave the ice on for 20 minutes, 2-3 times a day for the first 24 hours.   If the swelling does not go away after 24 hours, apply  a warm, moist washcloth to the area for 20 minutes, 2-3 times a day.  General Instructions   Avoid smoking for at least 30 minutes after the procedure.   Keep all follow-up visits as directed by your health care provider. It is important to continue with further therapeutic phlebotomy treatments as directed.  SEEK MEDICAL CARE IF:   You have redness, swelling, or pain at the needle insertion site.   You have fluid, blood, or pus coming from the needle insertion site.   You feel light-headed, dizzy, or nauseated, and the feeling does not go away.   You notice new bruising at the needle insertion site.   You feel weaker than normal.   You have a fever or chills.  SEEK IMMEDIATE MEDICAL CARE IF:   You have severe nausea or vomiting.   You have chest pain.   You have trouble breathing.    This information is not intended to replace advice given to you by your health care provider. Make sure you discuss any questions you have with your health care provider.    Document Released: 06/22/2010 Document Revised: 06/04/2014 Document Reviewed: 01/14/2014  Elsevier Interactive Patient Education 2016 [employer]

## 2015-09-29 ENCOUNTER — Other Ambulatory Visit: Payer: Self-pay | Admitting: Cardiovascular Disease

## 2015-09-30 NOTE — Telephone Encounter (Signed)
I have read the entire encounter from Dr. Ernestina Penna office visit and he does not mention stopping metoprolol.  I believe this was d/c'ed by mistake.  The patient's heart rate was normal at that visit and she has not reported any adverse reactions to our office.  Please refill.

## 2015-09-30 NOTE — Telephone Encounter (Signed)
Looks like this medication was discontinued by the patients oncologist on 08/13/15. Just wanted to clarify as the pharmacy is requesting a refill. Please advise. Thanks, MI

## 2015-10-08 ENCOUNTER — Other Ambulatory Visit (HOSPITAL_BASED_OUTPATIENT_CLINIC_OR_DEPARTMENT_OTHER): Payer: Medicare Other

## 2015-10-08 ENCOUNTER — Ambulatory Visit (HOSPITAL_BASED_OUTPATIENT_CLINIC_OR_DEPARTMENT_OTHER): Payer: Medicare Other

## 2015-10-08 VITALS — BP 192/80 | HR 73 | Temp 97.9°F | Resp 16

## 2015-10-08 DIAGNOSIS — D751 Secondary polycythemia: Secondary | ICD-10-CM

## 2015-10-08 DIAGNOSIS — D45 Polycythemia vera: Secondary | ICD-10-CM

## 2015-10-08 LAB — CBC WITH DIFFERENTIAL/PLATELET
BASO%: 0.2 % (ref 0.0–2.0)
Basophils Absolute: 0 10*3/uL (ref 0.0–0.1)
EOS%: 1.7 % (ref 0.0–7.0)
Eosinophils Absolute: 0.2 10*3/uL (ref 0.0–0.5)
HCT: 45.6 % (ref 34.8–46.6)
HGB: 14.1 g/dL (ref 11.6–15.9)
LYMPH%: 6 % — ABNORMAL LOW (ref 14.0–49.7)
MCH: 21.9 pg — ABNORMAL LOW (ref 25.1–34.0)
MCHC: 30.9 g/dL — ABNORMAL LOW (ref 31.5–36.0)
MCV: 70.9 fL — ABNORMAL LOW (ref 79.5–101.0)
MONO#: 0.9 10*3/uL (ref 0.1–0.9)
MONO%: 7.1 % (ref 0.0–14.0)
NEUT#: 10.9 10*3/uL — ABNORMAL HIGH (ref 1.5–6.5)
NEUT%: 85 % — ABNORMAL HIGH (ref 38.4–76.8)
Platelets: 530 10*3/uL — ABNORMAL HIGH (ref 145–400)
RBC: 6.43 10*6/uL — ABNORMAL HIGH (ref 3.70–5.45)
RDW: 18.7 % — ABNORMAL HIGH (ref 11.2–14.5)
WBC: 12.8 10*3/uL — ABNORMAL HIGH (ref 3.9–10.3)
lymph#: 0.8 10*3/uL — ABNORMAL LOW (ref 0.9–3.3)
nRBC: 0 % (ref 0–0)

## 2015-10-08 MED ORDER — SODIUM CHLORIDE 0.9 % IV SOLN
Freq: Once | INTRAVENOUS | Status: AC
  Administered 2015-10-08: 10:00:00 via INTRAVENOUS

## 2015-10-08 NOTE — Patient Instructions (Signed)

## 2015-10-14 ENCOUNTER — Other Ambulatory Visit: Payer: Self-pay | Admitting: Hematology

## 2015-10-14 DIAGNOSIS — D45 Polycythemia vera: Secondary | ICD-10-CM

## 2015-10-15 ENCOUNTER — Other Ambulatory Visit: Payer: Self-pay | Admitting: *Deleted

## 2015-10-15 DIAGNOSIS — D45 Polycythemia vera: Secondary | ICD-10-CM

## 2015-10-15 MED ORDER — ANAGRELIDE HCL 0.5 MG PO CAPS: 0.5000 mg | ORAL_CAPSULE | ORAL | 0 refills | Status: DC

## 2015-10-15 NOTE — Telephone Encounter (Signed)
Anagrelide refill request from pharmacy. OK to refill per Dr. Burr Medico. Sent electronically.

## 2015-11-05 ENCOUNTER — Other Ambulatory Visit (HOSPITAL_BASED_OUTPATIENT_CLINIC_OR_DEPARTMENT_OTHER): Payer: Medicare Other

## 2015-11-05 ENCOUNTER — Ambulatory Visit (HOSPITAL_BASED_OUTPATIENT_CLINIC_OR_DEPARTMENT_OTHER): Payer: Medicare Other

## 2015-11-05 VITALS — BP 152/86 | HR 75 | Temp 97.7°F | Resp 18

## 2015-11-05 DIAGNOSIS — D45 Polycythemia vera: Secondary | ICD-10-CM

## 2015-11-05 DIAGNOSIS — D751 Secondary polycythemia: Secondary | ICD-10-CM

## 2015-11-05 LAB — CBC WITH DIFFERENTIAL/PLATELET
BASO%: 0.1 % (ref 0.0–2.0)
Basophils Absolute: 0 10*3/uL (ref 0.0–0.1)
EOS%: 1.9 % (ref 0.0–7.0)
Eosinophils Absolute: 0.3 10*3/uL (ref 0.0–0.5)
HCT: 43.3 % (ref 34.8–46.6)
HEMOGLOBIN: 13.3 g/dL (ref 11.6–15.9)
LYMPH%: 7 % — AB (ref 14.0–49.7)
MCH: 20.8 pg — AB (ref 25.1–34.0)
MCHC: 30.7 g/dL — AB (ref 31.5–36.0)
MCV: 67.9 fL — AB (ref 79.5–101.0)
MONO#: 0.8 10*3/uL (ref 0.1–0.9)
MONO%: 6.1 % (ref 0.0–14.0)
NEUT#: 11.3 10*3/uL — ABNORMAL HIGH (ref 1.5–6.5)
NEUT%: 84.9 % — ABNORMAL HIGH (ref 38.4–76.8)
Platelets: 498 10*3/uL — ABNORMAL HIGH (ref 145–400)
RBC: 6.38 10*6/uL — AB (ref 3.70–5.45)
RDW: 17.8 % — ABNORMAL HIGH (ref 11.2–14.5)
WBC: 13.4 10*3/uL — AB (ref 3.9–10.3)
lymph#: 0.9 10*3/uL (ref 0.9–3.3)

## 2015-11-05 MED ORDER — SODIUM CHLORIDE 0.9 % IV SOLN
Freq: Once | INTRAVENOUS | Status: AC
  Administered 2015-11-05: 11:00:00 via INTRAVENOUS

## 2015-11-05 NOTE — Patient Instructions (Signed)

## 2015-11-05 NOTE — Progress Notes (Signed)
1030- Pt hct 43.3% today. Per  Dr.Feng's treatment parameters, pt to get phlebotomy if hct greater than 42%. Pt still c/o moderate fatigue and occasional headaches. Pt given copy of labs. Phlebotomy performed, using a 18g needle x2 on RAC. Total blood output of 510cc. Pt currently asymptomatic and tolerated treatment well. Pt will be getting 250-500cc of nss after phlebotomy to replace volume loss. Pt provided hydration and nutrition during post phlebotomy.  1111- Pt completed her IVF's today of 500cc. Pt VSS upon discharge. Asymptomatic at this time. Pt is by herself and states that she does not need anyone because she feels safe to drive home. Declined AVS. Pt encouraged to increase fluid intake today and to apply ice and warm compress on RAC for 20 min on/off to prevent bruising. Pt verbalized understanding.

## 2015-11-14 DIAGNOSIS — H43813 Vitreous degeneration, bilateral: Secondary | ICD-10-CM | POA: Diagnosis not present

## 2015-11-14 DIAGNOSIS — Z961 Presence of intraocular lens: Secondary | ICD-10-CM | POA: Diagnosis not present

## 2015-11-14 DIAGNOSIS — H04123 Dry eye syndrome of bilateral lacrimal glands: Secondary | ICD-10-CM | POA: Diagnosis not present

## 2015-11-14 DIAGNOSIS — H524 Presbyopia: Secondary | ICD-10-CM | POA: Diagnosis not present

## 2015-11-19 ENCOUNTER — Other Ambulatory Visit: Payer: Self-pay | Admitting: Hematology

## 2015-11-19 DIAGNOSIS — D45 Polycythemia vera: Secondary | ICD-10-CM

## 2015-12-03 ENCOUNTER — Ambulatory Visit (HOSPITAL_BASED_OUTPATIENT_CLINIC_OR_DEPARTMENT_OTHER): Payer: Medicare Other | Admitting: Hematology

## 2015-12-03 ENCOUNTER — Telehealth: Payer: Self-pay | Admitting: Hematology

## 2015-12-03 ENCOUNTER — Other Ambulatory Visit (HOSPITAL_BASED_OUTPATIENT_CLINIC_OR_DEPARTMENT_OTHER): Payer: Medicare Other

## 2015-12-03 ENCOUNTER — Encounter: Payer: Self-pay | Admitting: Hematology

## 2015-12-03 VITALS — BP 176/71 | HR 85 | Temp 97.9°F | Resp 18 | Ht 63.0 in | Wt 96.2 lb

## 2015-12-03 DIAGNOSIS — Z853 Personal history of malignant neoplasm of breast: Secondary | ICD-10-CM

## 2015-12-03 DIAGNOSIS — D45 Polycythemia vera: Secondary | ICD-10-CM

## 2015-12-03 DIAGNOSIS — D500 Iron deficiency anemia secondary to blood loss (chronic): Secondary | ICD-10-CM

## 2015-12-03 DIAGNOSIS — Z86000 Personal history of in-situ neoplasm of breast: Secondary | ICD-10-CM | POA: Diagnosis not present

## 2015-12-03 DIAGNOSIS — M810 Age-related osteoporosis without current pathological fracture: Secondary | ICD-10-CM | POA: Diagnosis not present

## 2015-12-03 LAB — COMPREHENSIVE METABOLIC PANEL
ALT: 14 U/L (ref 0–55)
AST: 20 U/L (ref 5–34)
Albumin: 3.7 g/dL (ref 3.5–5.0)
Alkaline Phosphatase: 85 U/L (ref 40–150)
Anion Gap: 8 mEq/L (ref 3–11)
BUN: 23 mg/dL (ref 7.0–26.0)
CALCIUM: 9.6 mg/dL (ref 8.4–10.4)
CHLORIDE: 103 meq/L (ref 98–109)
CO2: 27 mEq/L (ref 22–29)
CREATININE: 1.2 mg/dL — AB (ref 0.6–1.1)
EGFR: 39 mL/min/{1.73_m2} — ABNORMAL LOW (ref >90)
Glucose: 83 mg/dl (ref 70–140)
Potassium: 4.5 mEq/L (ref 3.5–5.1)
Sodium: 137 mEq/L (ref 136–145)
Total Bilirubin: 0.4 mg/dL (ref 0.20–1.20)
Total Protein: 6.8 g/dL (ref 6.4–8.3)

## 2015-12-03 LAB — CBC WITH DIFFERENTIAL/PLATELET
BASO%: 0.4 % (ref 0.0–2.0)
Basophils Absolute: 0.1 10*3/uL (ref 0.0–0.1)
EOS%: 2.6 % (ref 0.0–7.0)
Eosinophils Absolute: 0.4 10*3/uL (ref 0.0–0.5)
HEMATOCRIT: 39.5 % (ref 34.8–46.6)
HEMOGLOBIN: 11.9 g/dL (ref 11.6–15.9)
LYMPH#: 0.6 10*3/uL — AB (ref 0.9–3.3)
LYMPH%: 4.7 % — ABNORMAL LOW (ref 14.0–49.7)
MCH: 19.5 pg — ABNORMAL LOW (ref 25.1–34.0)
MCHC: 30.2 g/dL — AB (ref 31.5–36.0)
MCV: 64.5 fL — ABNORMAL LOW (ref 79.5–101.0)
MONO#: 0.7 10*3/uL (ref 0.1–0.9)
MONO%: 5.1 % (ref 0.0–14.0)
NEUT#: 11.9 10*3/uL — ABNORMAL HIGH (ref 1.5–6.5)
NEUT%: 87.2 % — AB (ref 38.4–76.8)
Platelets: 670 10*3/uL — ABNORMAL HIGH (ref 145–400)
RBC: 6.12 10*6/uL — ABNORMAL HIGH (ref 3.70–5.45)
RDW: 18 % — AB (ref 11.2–14.5)
WBC: 13.7 10*3/uL — AB (ref 3.9–10.3)

## 2015-12-03 MED ORDER — ANAGRELIDE HCL 0.5 MG PO CAPS: ORAL_CAPSULE | ORAL | 0 refills | Status: DC

## 2015-12-03 NOTE — Telephone Encounter (Signed)
Appointments scheduled per 11/1 LOS. Patient given AVS report and calendars with future scheduled appointments.  °

## 2015-12-03 NOTE — Progress Notes (Signed)
[place] ONCOLOGY OFFICE PROGRESS NOTE DATE OF VISIT: 12/03/2015   [name], MD [facility] [address]  DIAGNOSIS: Polycythemia vera ([name])  History of breast cancer in female, Breast Cancer   PREVIOUS AND CURRENT THERAPY:  1. Phlebotomy if hematocrit is greater than or equal to 42% (changed to 45% in May 2016). The patient receives 300-400 mL of normal saline following each 2/1-1-unit phlebotomy  2. Anagrelide 0.5 mg twice daily and aspirin 81 mg daily, anagrelide increased to 1.0mg  AM and 0.5mg  PM since Feb 2016, and 1mg  twice daily on 12/03/2015 3. She previously tried Hydrea 500mg  daily, developed thrombocytopenia, was subsequently held.  INTERVAL HISTORY:  [name] 82 y.o. female with a history of polycythemia vera (04/21/2007) with positive JAK2 mutation is here for follow-up. She is doing well overall, her left leg pain from her prior fracture has improved, and she is able to walk independently, she uses a can sometime. No other new complaints. She is compliant with anagrelide, tolerating well. Denies any bleeding or other new symptoms. Her last phlebotomy was 2 months ago.  MEDICAL HISTORY: Past Medical History:  Diagnosis Date  . Acute blood loss anemia   . Arthritis   . Atrophic gastritis   . Breast cancer ([name])    right  . Candida esophagitis ([place])   . Chest tightness   . Chronic gastritis   . Closed intertrochanteric fracture of left femur with routine healing   . Diverticulosis   . Hypertension   . IBS (irritable bowel syndrome)   . MVP (mitral valve prolapse)   . Palpitation   . Polycythemia   . PVC's (premature ventricular contractions)   . Rhinitis   . Unsteady gait     INTERIM HISTORY: has Malignant neoplasm of female breast ([name]); Polycythemia vera ([name]); Atrophic gastritis; DIVERTICULOSIS OF COLON; ARTHRITIS; ABDOMINAL PAIN, EPIGASTRIC; HTN (hypertension); MVP (mitral valve prolapse); Closed  left hip fracture ([name]); Leukocytosis; Fracture, intertrochanteric, left femur ([name]); Faintness; Acute blood loss anemia; Thrombocytosis ([name]); Dehydration; Osteoporosis; and History of breast cancer in female on her problem list.    ALLERGIES:  is allergic to sulfur.  MEDICATIONS: has a current medication list which includes the following prescription(s): anagrelide, aspirin, bisacodyl, calcium carbonate-vitamin d, cetirizine hcl, vitamin d3, colestipol, fluticasone, hydrochlorothiazide, lisinopril, loperamide, metoprolol succinate, multivitamin, pantoprazole, potassium chloride, and probiotic product.  SURGICAL HISTORY:  Past Surgical History:  Procedure Laterality Date  . ABDOMINAL HYSTERECTOMY     ovaries taken  . APPENDECTOMY    . BREAST LUMPECTOMY     right  . CARDIOVASCULAR STRESS TEST  12/23/2000   EF 70%  . CATARACT EXTRACTION     bilateral  . ELBOW FRACTURE SURGERY     left  . FEMUR IM NAIL Left 05/08/2015   Procedure: INTERTROCHANTERIC FEMORAL NAIL;  Surgeon: Rod Can, MD;  Location: [facility];  Service: Orthopedics;  Laterality: Left;  . HEMATOMA EVACUATION     right  . TONSILLECTOMY    . TRANSTHORACIC ECHOCARDIOGRAM  03/05/2010   EF 55-60%   PROBLEM LIST:  1. Polycythemia vera diagnosed in March 2009. JAK2 mutation was detected on 04/21/2007. Hemoglobin in April 2009 was 18.4 with hematocrit of 55.0. Platelet count on July 12, 2007, was 868,000. The patient has never had a bone marrow exam. She had been undergoing phlebotomies in the past, approximately once a month, for hematocrit greater than or equal to 45%. The patient has been on anagrelide 0.5 mg twice daily,  as well as aspirin. She has not had any thrombotic complications. We will be carrying out a 1-unit phlebotomy whenever the hematocrit is greater than or equal to 42%. The patient does require normal saline 300 mL after phlebotomy because of symptomatic hypotension.  2. 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  Lumpectomy on 01/04/2006 followed by radiation treatments under the direction of Dr. Gery Pray. The patient has not required any systemic therapy. She does have yearly mammograms.  3. Mitral valve prolapse.  4. Hypertension.  5. Diverticulosis.  6. History of esophageal stricture.  7. History of gastritis.  8. History of pelvic fractures after falling on August 23, 2006.  REVIEW OF SYSTEMS:   Constitutional: Denies fevers, chills or abnormal weight loss Eyes: Denies blurriness of vision Ears, nose, mouth, throat, and face: Denies mucositis or sore throat Respiratory: Denies cough, dyspnea or wheezes Cardiovascular: Denies palpitation, chest discomfort or lower extremity swelling Gastrointestinal:  Denies nausea, heartburn or change in bowel habits Skin: Denies abnormal skin rashes Lymphatics: Denies new lymphadenopathy or easy bruising Neurological:Denies numbness, tingling or new weaknesses Behavioral/Psych: Mood is stable, no new changes  All other systems were reviewed with the patient and are negative.  PHYSICAL EXAMINATION: ECOG PERFORMANCE STATUS: 0  Blood pressure (!) 176/71, pulse 85, temperature 97.9 F (36.6 C), temperature source Oral, resp. rate 18, height 5\' 3"  (1.6 m), weight 96 lb 3.2 oz (43.6 kg), SpO2 98 %.  GENERAL:alert, no distress and comfortable; she has scoliosis and looks her stated age.  SKIN: skin color, texture, turgor are normal, no rashes but seborrheic keratosis.  EYES: normal, Conjunctiva are pink and non-injected, sclera clear OROPHARYNX:no exudate, no erythema and lips, buccal mucosa, and tongue normal  NECK: supple, thyroid normal size, non-tender, without nodularity LYMPH:  no palpable lymphadenopathy in the cervical, axillary or supraclavicular LUNGS: clear to auscultation and percussion with normal breathing effort HEART: regular rate & rhythm and no murmurs and no lower extremity edema ABDOMEN:abdomen soft, non-tender and  normal bowel sounds Musculoskeletal: (+) Mild tenderness at the right hip and pelvic area. The range of motion of right hip is normal. NEURO: alert & oriented x 3 with fluent speech, no focal motor/sensory deficits Breasts: Breast inspection showed them to be symmetrical with no nipple discharge. Status post right lumpectomy change.  Palpation of the breasts and axilla revealed no obvious mass that I could appreciate.   LABORATORY DATA: CBC Latest Ref Rng & Units 12/03/2015 11/05/2015 10/08/2015  WBC 3.9 - 10.3 10e3/uL 13.7(H) 13.4(H) 12.8(H)  Hemoglobin 11.6 - 15.9 g/dL 11.9 13.3 14.1  Hematocrit 34.8 - 46.6 % 39.5 43.3 45.6  Platelets 145 - 400 10e3/uL 670(H) 498(H) 530 Large platelets present(H)    CMP Latest Ref Rng & Units 08/13/2015 06/11/2015 05/15/2015  Glucose 70 - 140 mg/dl 95 149(H) -  BUN 7.0 - 26.0 mg/dL 18.9 16.1 18  Creatinine 0.6 - 1.1 mg/dL 0.9 1.0 0.8  Sodium 136 - 145 mEq/L 134(L) 127(L) 131(A)  Potassium 3.5 - 5.1 mEq/L 4.9 4.2 4.4  Chloride 101 - 111 mmol/L - - -  CO2 22 - 29 mEq/L 25 25 -  Calcium 8.4 - 10.4 mg/dL 9.9 9.9 -  Total Protein 6.4 - 8.3 g/dL 6.6 6.4 -  Total Bilirubin 0.20 - 1.20 mg/dL 0.38 0.60 -  Alkaline Phos 40 - 150 U/L 123 160(H) 80  AST 5 - 34 U/L 17 16 40(A)  ALT 0 - 55 U/L 12 12 38(A)  IMAGING STUDIES:  1. Ultrasound of the abdomen, limited, on 01/21/2006, showed a splenic volume of 73 cc with maximum ultrasound dimension of 8.8 cm. Spleen measured 8.8 x 4.1 x 3.9 cm, for a volume of 73 cc. No focal lesions were identified.  2. Diagnostic bilateral digital mammogram carried out at [facility] on 01/01/2011 showed no suspicious findings.  3. Mammogram, diagnostic, bilateral on 01/04/2012 showed no significant abnormalities. There were post lumpectomy changes present involving the right breast.   ASSESSMENT: Filbert Berthold Scifres 82 y.o. female with a history of Polycythemia vera ([place])  History of breast cancer in female   PLAN:  1.  Myeloproliferative neoplasm, Polycythemia vera, JAK2(+)  --The patient is doing well clinically. -Her H/H significantly increased after iv iron during her femur fracture, improved after phlebotomy  -Her platelet count has been increasing lately, 670K today, she is compliant with an epiglottic, I'll increase her dose to 1 mg twice daily -She previously did not tolerate Hydrea due to thrombocytopenia. -We discussed this is a incurable disease, most people have indolent and benign course, the major complication is thrombosis. Some patients may develop leukemia and myelofibrosis at the end. -No need for phlebotomy for today, she is actually mildly anemic, I'll check her iron level on next visit.  2. History of right breast DCIS  --Involving the right breast with biopsy on 11/29/2005.  Lumpectomy on 01/04/2006 followed by radiation treatments under the direction of Dr. Gery Pray.  -She is still on annual screening mammogram surveillance, last one this year was normal per pt.  3. L2 compression fracture in 04/2014, left femoral fracture in April 2017, osteoporosis -Her pain and the morbility has improved much after rehab  -Continue supportive care and symptom management by her primary care physician -She is on Prolia with her PCP, I encourage her to continue calcium and vitamin D 2 tablets a day   Plan -Increase anagrelide to 1 mg twice daily, I refilled for her today  -CBC every 2 weeks with phlebotomy 3/4 unit blood if HCT>45%, with normal saline 400 mL after phlebotomy.  -I'll see her back in 6 weeks  All questions were answered. The patient knows to call the clinic with any problems, questions or concerns. We can certainly see the patient much sooner if necessary.  Patient was provided a handout on polycythemia vera.   I spent 15 minutes counseling the patient face to face. The total time spent in the appointment was 20 minutes.   Truitt Merle  12/03/2015

## 2015-12-09 ENCOUNTER — Telehealth: Payer: Self-pay | Admitting: *Deleted

## 2015-12-09 NOTE — Telephone Encounter (Signed)
Received call from Dr Tina Griffiths DDS office stating that pt needs two teeth extracted & is on anegrelide & wants to know if she should be off this drug for extractions.  Message to Dr Burr Medico.  Call back # is [phone]. Called back after speaking to Dr Burr Medico & spoke with Rise Paganini at Dr Tina Griffiths & informed no need to stop or hold anegrelide per Dr Burr Medico. Informed that pt is on aspirin & may want to hold that for extractions per Dr Burr Medico.

## 2015-12-17 ENCOUNTER — Other Ambulatory Visit (HOSPITAL_BASED_OUTPATIENT_CLINIC_OR_DEPARTMENT_OTHER): Payer: Medicare Other

## 2015-12-17 DIAGNOSIS — D45 Polycythemia vera: Secondary | ICD-10-CM | POA: Diagnosis present

## 2015-12-17 DIAGNOSIS — D500 Iron deficiency anemia secondary to blood loss (chronic): Secondary | ICD-10-CM

## 2015-12-17 LAB — IRON AND TIBC
%SAT: 4 % — AB (ref 21–57)
Iron: 17 ug/dL — ABNORMAL LOW (ref 41–142)
TIBC: 396 ug/dL (ref 236–444)
UIBC: 379 ug/dL (ref 120–384)

## 2015-12-17 LAB — CBC WITH DIFFERENTIAL/PLATELET
BASO%: 0.2 % (ref 0.0–2.0)
BASOS ABS: 0 10*3/uL (ref 0.0–0.1)
EOS ABS: 0.3 10*3/uL (ref 0.0–0.5)
EOS%: 2.3 % (ref 0.0–7.0)
HEMATOCRIT: 39.5 % (ref 34.8–46.6)
HEMOGLOBIN: 11.8 g/dL (ref 11.6–15.9)
LYMPH#: 0.7 10*3/uL — AB (ref 0.9–3.3)
LYMPH%: 4.8 % — ABNORMAL LOW (ref 14.0–49.7)
MCH: 18.8 pg — AB (ref 25.1–34.0)
MCHC: 29.9 g/dL — AB (ref 31.5–36.0)
MCV: 62.9 fL — AB (ref 79.5–101.0)
MONO#: 0.6 10*3/uL (ref 0.1–0.9)
MONO%: 4.6 % (ref 0.0–14.0)
NEUT#: 12.2 10*3/uL — ABNORMAL HIGH (ref 1.5–6.5)
NEUT%: 88.1 % — AB (ref 38.4–76.8)
Platelets: 512 10*3/uL — ABNORMAL HIGH (ref 145–400)
RBC: 6.28 10*6/uL — ABNORMAL HIGH (ref 3.70–5.45)
RDW: 18.1 % — AB (ref 11.2–14.5)
WBC: 13.9 10*3/uL — ABNORMAL HIGH (ref 3.9–10.3)

## 2015-12-17 LAB — FERRITIN: Ferritin: 6 ng/ml — ABNORMAL LOW (ref 9–269)

## 2015-12-23 DIAGNOSIS — Z23 Encounter for immunization: Secondary | ICD-10-CM | POA: Diagnosis not present

## 2015-12-31 ENCOUNTER — Other Ambulatory Visit (HOSPITAL_BASED_OUTPATIENT_CLINIC_OR_DEPARTMENT_OTHER): Payer: Medicare Other

## 2015-12-31 DIAGNOSIS — D45 Polycythemia vera: Secondary | ICD-10-CM

## 2015-12-31 LAB — CBC WITH DIFFERENTIAL/PLATELET
BASO%: 0.4 % (ref 0.0–2.0)
BASOS ABS: 0.1 10*3/uL (ref 0.0–0.1)
EOS ABS: 0.4 10*3/uL (ref 0.0–0.5)
EOS%: 3.1 % (ref 0.0–7.0)
HEMATOCRIT: 39.2 % (ref 34.8–46.6)
HEMOGLOBIN: 12 g/dL (ref 11.6–15.9)
LYMPH#: 0.9 10*3/uL (ref 0.9–3.3)
LYMPH%: 6.3 % — ABNORMAL LOW (ref 14.0–49.7)
MCH: 18.9 pg — AB (ref 25.1–34.0)
MCHC: 30.7 g/dL — ABNORMAL LOW (ref 31.5–36.0)
MCV: 61.6 fL — ABNORMAL LOW (ref 79.5–101.0)
MONO#: 0.7 10*3/uL (ref 0.1–0.9)
MONO%: 5.1 % (ref 0.0–14.0)
NEUT#: 11.8 10*3/uL — ABNORMAL HIGH (ref 1.5–6.5)
NEUT%: 85.1 % — AB (ref 38.4–76.8)
PLATELETS: 521 10*3/uL — AB (ref 145–400)
RBC: 6.37 10*6/uL — ABNORMAL HIGH (ref 3.70–5.45)
RDW: 18.3 % — AB (ref 11.2–14.5)
WBC: 13.9 10*3/uL — ABNORMAL HIGH (ref 3.9–10.3)

## 2016-01-01 ENCOUNTER — Telehealth: Payer: Self-pay | Admitting: *Deleted

## 2016-01-01 ENCOUNTER — Encounter: Payer: Self-pay | Admitting: *Deleted

## 2016-01-01 DIAGNOSIS — D45 Polycythemia vera: Secondary | ICD-10-CM

## 2016-01-01 NOTE — Telephone Encounter (Signed)
-----   Message from Truitt Merle, MD sent at 12/31/2015  8:33 PM EST ----- Please call pt and let her know the lab result. Her platelet count has improved, but overall still high, I recommend her increase anagrelide to 1mg  in am and 0.5mg  in evening. See me in 2 weeks as it's scheduled.   Thanks  Truitt Merle  12/31/2015

## 2016-01-01 NOTE — Telephone Encounter (Signed)
Called pt to discuss anagrelide dose & she reports that she is already taking 1 mg BID.  Discussed with Dr Burr Medico & informed pt to increase to 1.5 mg in am & 1 mg in pm.  Pt expressed understanding & will be back to office in @ 2 wks.

## 2016-01-09 ENCOUNTER — Other Ambulatory Visit: Payer: Self-pay | Admitting: Hematology

## 2016-01-09 DIAGNOSIS — D45 Polycythemia vera: Secondary | ICD-10-CM

## 2016-01-14 ENCOUNTER — Encounter: Payer: Self-pay | Admitting: Hematology

## 2016-01-14 ENCOUNTER — Ambulatory Visit (HOSPITAL_BASED_OUTPATIENT_CLINIC_OR_DEPARTMENT_OTHER): Payer: Medicare Other | Admitting: Hematology

## 2016-01-14 ENCOUNTER — Other Ambulatory Visit (HOSPITAL_BASED_OUTPATIENT_CLINIC_OR_DEPARTMENT_OTHER): Payer: Medicare Other

## 2016-01-14 ENCOUNTER — Telehealth: Payer: Self-pay | Admitting: Hematology

## 2016-01-14 VITALS — BP 192/72 | HR 79 | Temp 97.7°F | Resp 16 | Ht 63.0 in | Wt 95.1 lb

## 2016-01-14 DIAGNOSIS — D45 Polycythemia vera: Secondary | ICD-10-CM

## 2016-01-14 DIAGNOSIS — M810 Age-related osteoporosis without current pathological fracture: Secondary | ICD-10-CM

## 2016-01-14 DIAGNOSIS — D500 Iron deficiency anemia secondary to blood loss (chronic): Secondary | ICD-10-CM

## 2016-01-14 LAB — CBC WITH DIFFERENTIAL/PLATELET
BASO%: 0.7 % (ref 0.0–2.0)
BASOS ABS: 0.1 10*3/uL (ref 0.0–0.1)
EOS ABS: 0.5 10*3/uL (ref 0.0–0.5)
EOS%: 2.9 % (ref 0.0–7.0)
HEMATOCRIT: 40.1 % (ref 34.8–46.6)
HEMOGLOBIN: 11.9 g/dL (ref 11.6–15.9)
LYMPH#: 0.9 10*3/uL (ref 0.9–3.3)
LYMPH%: 5.6 % — ABNORMAL LOW (ref 14.0–49.7)
MCH: 18.3 pg — AB (ref 25.1–34.0)
MCHC: 29.8 g/dL — AB (ref 31.5–36.0)
MCV: 61.4 fL — ABNORMAL LOW (ref 79.5–101.0)
MONO#: 0.8 10*3/uL (ref 0.1–0.9)
MONO%: 5.2 % (ref 0.0–14.0)
NEUT#: 13.3 10*3/uL — ABNORMAL HIGH (ref 1.5–6.5)
NEUT%: 85.6 % — AB (ref 38.4–76.8)
PLATELETS: 654 10*3/uL — AB (ref 145–400)
RBC: 6.53 10*6/uL — ABNORMAL HIGH (ref 3.70–5.45)
RDW: 19 % — AB (ref 11.2–14.5)
WBC: 15.5 10*3/uL — ABNORMAL HIGH (ref 3.9–10.3)

## 2016-01-14 NOTE — Telephone Encounter (Signed)
Appointments scheduled per 12/13 LOS. Patient given AVS report and calendars with future scheduled appointments.  °

## 2016-01-14 NOTE — Progress Notes (Signed)
[place] ONCOLOG[facility] OFFICE PROGRESS NOTE DATE OF VISIT: 01/14/2016   [name], MD [place] [address]  DIAGNOSIS: Polycythemia vera ([place])  Osteoporosis, unspecified osteoporosis type, unspecified pathological fracture presence, Breast Cancer   PREVIOUS AND CURRENT THERAPY:  1. Phlebotomy if hematocrit is greater than or equal to 42% (changed to 45% in May 2016). The patient receives 300-400 mL of normal saline following each 2/1-1-unit phlebotomy  2. Anagrelide 0.5 mg twice daily and aspirin 81 mg daily, anagrelide increased to 1.0mg  AM and 0.5mg  PM since Feb 2016, and 1mg  twice daily on 12/03/2015 3. She previously tried Hydrea 500mg  daily, developed thrombocytopenia, was subsequently held.  INTERVAL HISTORY:  [name] 82 y.o. female with a history of polycythemia vera (04/21/2007) with positive JAK2 mutation is here for follow-up. She is doing well overall, her left leg pain from her prior fracture has near resolved, and she is able to walk independently, although still little slow. She complains of moderate fatigue, she is able to do her routine activities, but does feel exhausted afterwards. She has loose bowel movement 3-4 times a day, takes Imodium as needed, she was diagnosed with irritable bowel syndrome a few months ago.  MEDICAL HISTORY: Past Medical History:  Diagnosis Date  . Acute blood loss anemia   . Arthritis   . Atrophic gastritis   . Breast cancer ([place])    right  . Candida esophagitis ([place])   . Chest tightness   . Chronic gastritis   . Closed intertrochanteric fracture of left femur with routine healing   . Diverticulosis   . Hypertension   . IBS (irritable bowel syndrome)   . MVP (mitral valve prolapse)   . Palpitation   . Polycythemia   . PVC's (premature ventricular contractions)   . Rhinitis   . Unsteady gait     INTERIM HISTORY: has Malignant neoplasm of female breast ([place]);  Polycythemia vera ([place]); Atrophic gastritis; DIVERTICULOSIS OF COLON; ARTHRITIS; ABDOMINAL PAIN, EPIGASTRIC; HTN (hypertension); MVP (mitral valve prolapse); Closed left hip fracture ([place]); Leukocytosis; Fracture, intertrochanteric, left femur ([place]); Faintness; Acute blood loss anemia; Thrombocytosis ([place]); Dehydration; Osteoporosis; and History of breast cancer in female on her problem list.    ALLERGIES:  is allergic to sulfur.  MEDICATIONS: has a current medication list which includes the following prescription(s): anagrelide, aspirin, calcium carbonate-vitamin d, vitamin d3, fluticasone, hydrochlorothiazide, lisinopril, loperamide, multivitamin, pantoprazole, potassium chloride, probiotic product, bisacodyl, cetirizine hcl, colestipol, and metoprolol succinate.  SURGICAL HISTORY:  Past Surgical History:  Procedure Laterality Date  . ABDOMINAL HYSTERECTOMY     ovaries taken  . APPENDECTOMY    . BREAST LUMPECTOMY     right  . CARDIOVASCULAR STRESS TEST  12/23/2000   EF 70%  . CATARACT EXTRACTION     bilateral  . ELBOW FRACTURE SURGERY     left  . FEMUR IM NAIL Left 05/08/2015   Procedure: INTERTROCHANTERIC FEMORAL NAIL;  Surgeon: Rod Can, MD;  Location: [facility];  Service: Orthopedics;  Laterality: Left;  . HEMATOMA EVACUATION     right  . TONSILLECTOMY    . TRANSTHORACIC ECHOCARDIOGRAM  03/05/2010   EF 55-60%   PROBLEM LIST:  1. Polycythemia vera diagnosed in March 2009. JAK2 mutation was detected on 04/21/2007. Hemoglobin in April 2009 was 18.4 with hematocrit of 55.0. Platelet count on July 12, 2007, was 868,000. The patient has never had a bone marrow exam. She had been undergoing phlebotomies in the past, approximately once a  month, for hematocrit greater than or equal to 45%. The patient has been on anagrelide 0.5 mg twice daily, as well as aspirin. She has not had any thrombotic complications. We will be carrying out a 1-unit phlebotomy whenever the hematocrit is greater than  or equal to 42%. The patient does require normal saline 300 mL after phlebotomy because of symptomatic hypotension.  2. 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  Lumpectomy on 01/04/2006 followed by radiation treatments under the direction of Dr. Gery Pray. The patient has not required any systemic therapy. She does have yearly mammograms.  3. Mitral valve prolapse.  4. Hypertension.  5. Diverticulosis.  6. History of esophageal stricture.  7. History of gastritis.  8. History of pelvic fractures after falling on August 23, 2006.  REVIEW OF SYSTEMS:   Constitutional: Denies fevers, chills or abnormal weight loss Eyes: Denies blurriness of vision Ears, nose, mouth, throat, and face: Denies mucositis or sore throat Respiratory: Denies cough, dyspnea or wheezes Cardiovascular: Denies palpitation, chest discomfort or lower extremity swelling Gastrointestinal:  Denies nausea, heartburn or change in bowel habits Skin: Denies abnormal skin rashes Lymphatics: Denies new lymphadenopathy or easy bruising Neurological:Denies numbness, tingling or new weaknesses Behavioral/Psych: Mood is stable, no new changes  All other systems were reviewed with the patient and are negative.  PHYSICAL EXAMINATION: ECOG PERFORMANCE STATUS: 0  Blood pressure (!) 192/72, pulse 79, temperature 97.7 F (36.5 C), temperature source Oral, resp. rate 16, height 5\' 3"  (1.6 m), weight 95 lb 1.6 oz (43.1 kg), SpO2 98 %.  GENERAL:alert, no distress and comfortable; she has scoliosis and looks her stated age.  SKIN: skin color, texture, turgor are normal, no rashes but seborrheic keratosis.  EYES: normal, Conjunctiva are pink and non-injected, sclera clear OROPHARYNX:no exudate, no erythema and lips, buccal mucosa, and tongue normal  NECK: supple, thyroid normal size, non-tender, without nodularity LYMPH:  no palpable lymphadenopathy in the cervical, axillary or supraclavicular LUNGS: clear to  auscultation and percussion with normal breathing effort HEART: regular rate & rhythm and no murmurs and no lower extremity edema ABDOMEN:abdomen soft, non-tender and normal bowel sounds Musculoskeletal: (+) Mild tenderness at the right hip and pelvic area. The range of motion of right hip is normal. NEURO: alert & oriented x 3 with fluent speech, no focal motor/sensory deficits Breasts: Breast inspection showed them to be symmetrical with no nipple discharge. Status post right lumpectomy change.  Palpation of the breasts and axilla revealed no obvious mass that I could appreciate.   LABORATORY DATA: CBC Latest Ref Rng & Units 01/14/2016 12/31/2015 12/17/2015  WBC 3.9 - 10.3 10e3/uL 15.5(H) 13.9(H) 13.9(H)  Hemoglobin 11.6 - 15.9 g/dL 11.9 12.0 11.8  Hematocrit 34.8 - 46.6 % 40.1 39.2 39.5  Platelets 145 - 400 10e3/uL 654(H) 521(H) 512(H)    CMP Latest Ref Rng & Units 12/03/2015 08/13/2015 06/11/2015  Glucose 70 - 140 mg/dl 83 95 149(H)  BUN 7.0 - 26.0 mg/dL 23.0 18.9 16.1  Creatinine 0.6 - 1.1 mg/dL 1.2(H) 0.9 1.0  Sodium 136 - 145 mEq/L 137 134(L) 127(L)  Potassium 3.5 - 5.1 mEq/L 4.5 4.9 4.2  Chloride 101 - 111 mmol/L - - -  CO2 22 - 29 mEq/L 27 25 25   Calcium 8.4 - 10.4 mg/dL 9.6 9.9 9.9  Total Protein 6.4 - 8.3 g/dL 6.8 6.6 6.4  Total Bilirubin 0.20 - 1.20 mg/dL 0.40 0.38 0.60  Alkaline Phos 40 - 150 U/L 85 123 160(H)  AST 5 - 34  U/L 20 17 16   ALT 0 - 55 U/L 14 12 12      IMAGING STUDIES:  1. Ultrasound of the abdomen, limited, on 01/21/2006, showed a splenic volume of 73 cc with maximum ultrasound dimension of 8.8 cm. Spleen measured 8.8 x 4.1 x 3.9 cm, for a volume of 73 cc. No focal lesions were identified.  2. Diagnostic bilateral digital mammogram carried out at [facility] on 01/01/2011 showed no suspicious findings.  3. Mammogram, diagnostic, bilateral on 01/04/2012 showed no significant abnormalities. There were post lumpectomy changes present involving the right  breast.   ASSESSMENT: Filbert Berthold Kaminsky 82 y.o. female with a history of Polycythemia vera ([place])  Osteoporosis, unspecified osteoporosis type, unspecified pathological fracture presence   PLAN:  1. Myeloproliferative neoplasm, Polycythemia vera, JAK2(+)  --The patient is doing well clinically. -Her H/H significantly increased after iv iron during her femur fracture, improved after phlebotomy  -lab reviewed, she has mild anemia, and lab evidence of iron deficiency, probably related to phlebotomy. -Her platelet count has been increasing lately, 670K today, partially related to reactive thrombocytosis secondary to iron deficiency. She is compliant with anagrilide, I have increased her dose to 1.5 mg in the morning and 1.0mg  in evening 2 weeks ago, will continue current dose now -Due to her mild anemia, iron deficiency, and fatigue, I recommend her start taking ferrous sulfate one tablet a day, to improve her energy and thrombocytosis. We'll monitor her labs closely, we'll not over replace her iron, which will make her PV worse  -She previously did not tolerate Hydrea due to thrombocytopenia. -We discussed this is a incurable disease, most people have indolent and benign course, the major complication is thrombosis. Some patients may develop leukemia and myelofibrosis at the end.  2. History of right breast DCIS  --Involving the right breast with biopsy on 11/29/2005.  Lumpectomy on 01/04/2006 followed by radiation treatments under the direction of Dr. Gery Pray.  -She is still on annual screening mammogram surveillance, will continue.   3. L2 compression fracture in 04/2014, left femoral fracture in April 2017, osteoporosis -Her pain and the morbility has improved much after rehab  -Continue supportive care and symptom management by her primary care physician -She is on Prolia with her PCP, I encourage her to continue calcium and vitamin D 2 tablets a day   Plan -continue anagrelide 1.5mg  in  am and 1.0mg  in evening -she will start ferrous sulfate 1 tablet a day, for her mild iron deficiency anemia and fatigue, and reactive thrombocytosis, will stop if her anemia resolves and ferritin >30 -CBC every 2 weeks with phlebotomy 3/4 unit blood if HCT>47%, with normal saline 400 mL after phlebotomy. Due to her iron deficient anemia and poor tolerance to phlebotomy, I'll increase the cut off for her phlebotomy from 45% to 47% -I'll see her back in 8 weeks  All questions were answered. The patient knows to call the clinic with any problems, questions or concerns. We can certainly see the patient much sooner if necessary.  Patient was provided a handout on polycythemia vera.   I spent 15 minutes counseling the patient face to face. The total time spent in the appointment was 20 minutes.   Truitt Merle  01/14/2016

## 2016-01-20 ENCOUNTER — Encounter: Payer: Self-pay | Admitting: Gastroenterology

## 2016-01-20 ENCOUNTER — Ambulatory Visit (INDEPENDENT_AMBULATORY_CARE_PROVIDER_SITE_OTHER): Payer: Medicare Other | Admitting: Gastroenterology

## 2016-01-20 ENCOUNTER — Telehealth: Payer: Self-pay | Admitting: Gastroenterology

## 2016-01-20 VITALS — BP 160/80 | HR 76 | Ht 63.0 in | Wt 96.0 lb

## 2016-01-20 DIAGNOSIS — K580 Irritable bowel syndrome with diarrhea: Secondary | ICD-10-CM

## 2016-01-20 DIAGNOSIS — E739 Lactose intolerance, unspecified: Secondary | ICD-10-CM | POA: Diagnosis not present

## 2016-01-20 MED ORDER — RIFAXIMIN 550 MG PO TABS: 550.0000 mg | ORAL_TABLET | Freq: Three times a day (TID) | ORAL | 0 refills | Status: AC

## 2016-01-20 MED ORDER — COLESTIPOL HCL 1 G PO TABS: 1.0000 g | ORAL_TABLET | Freq: Every day | ORAL | 12 refills | Status: DC

## 2016-01-20 NOTE — Telephone Encounter (Signed)
Dr Silverio Decamp, Patient wants to know why Colestipol was prescribed

## 2016-01-20 NOTE — Progress Notes (Signed)
[name]    UT:[id number]    [birth year]  Primary Care Physician:PHARR,WALTER DAVIDSON, MD  Referring Physician: Deland Pretty, MD [place] [address] [place], [place] [address]  Chief complaint:  Diarrhea  HPI: 82 year old with history of polycythemia vera, JAK 2 mutation, previously followed by Dr. Olevia Perches last seen in office in July 2015 for irritable bowel syndrome predominant diarrhea is here for follow-up visit accompanied by her daughter. Patient is here with complaints of worsening diarrhea for the past few months, she feels she is having more frequent episodes. She has intermittent diarrhea, currently about 2-3 times a week and when she has the episodes its increased urgency with some formed stool about 3 to 5 a day. Previously she was having these episodes once every week or once every 2 weeks and now they have become more frequent. She doesn't have any nocturnal symptoms. She doesn't recall any dietary intolerance or cannot identify any triggers. She did notice some improvement with Colestipol as previously prescribed by Dr. Olevia Perches, ran out of the med few weeks ago She is not eating much due to lack of appetite. She doesn't drink milk but does eat cheese and ice cream on regular basis Denies any blood in stool, dysphagia, odynophagia, nausea or vomiting Last colonoscopy in February 2007 showed left-sided diverticulosis otherwise unremarkable. EGD in September 2013 showed stricture at EG junction status post dilation and also had evidence of gastropathy with erosions. H. pylori negative.  Outpatient Encounter Prescriptions as of 01/20/2016  Medication Sig  . amLODipine (NORVASC) 2.5 MG tablet Take 1.25 mg by mouth daily.  Marland Kitchen anagrelide (AGRYLIN) 0.5 MG capsule TAKE (2) CAPSULES TWICE DAILY. (Patient taking differently: take 3 capsules by mouth in the AM and 2 in the PM)  . aspirin 81 MG tablet Take 81 mg by mouth daily.    . Calcium Carbonate-Vitamin D (CALCIUM  500/D PO) Take 1 capsule by mouth daily.  . Cetirizine HCl (ZYRTEC PO) Take 1 tablet by mouth as needed (AS NEEDED FOR ALLERGIES).   . Cholecalciferol (VITAMIN D3) 1000 UNITS CAPS Take 2,000 mg by mouth 2 (two) times daily.   . Ferrous Fumarate-Vitamin C ER (FERRO-SEQUELS) 65-25 MG TBCR Take 1 tablet by mouth daily.  . fluticasone (FLONASE) 50 MCG/ACT nasal spray Place 2 sprays into the nose as needed for allergies or rhinitis. Reported on 06/11/2015  . hydrochlorothiazide (HYDRODIURIL) 12.5 MG tablet Take 12.5 mg by mouth daily.  Marland Kitchen lisinopril (PRINIVIL,ZESTRIL) 20 MG tablet Take 20 mg by mouth daily.  Marland Kitchen loperamide (IMODIUM) 2 MG capsule Take 2 mg by mouth as needed for diarrhea or loose stools.  . metoprolol succinate (TOPROL-XL) 50 MG 24 hr tablet Take one tablet by mouth once daily with food  . Multiple Vitamin (MULTIVITAMIN) tablet Take 1 tablet by mouth daily.    . pantoprazole (PROTONIX) 40 MG tablet Take 40 mg by mouth as needed (AS NEEDED FOR STOMACH).   Marland Kitchen potassium chloride (K-DUR) 10 MEQ tablet Take 1 tablet (10 mEq total) by mouth daily.  . Probiotic Product (ULTRAFLORA IMMUNE HEALTH PO) Take 1 tablet by mouth daily. Reported on 07/01/2015  . colestipol (COLESTID) 1 g tablet Take 1-2 tablets (1-2 g total) by mouth daily. Reported on 05/14/2015  . rifaximin (XIFAXAN) 550 MG TABS tablet Take 1 tablet (550 mg total) by mouth 3 (three) times daily.  . [DISCONTINUED] bisacodyl (DULCOLAX) 10 MG suppository Place 10 mg rectally daily as needed for  moderate constipation. Reported on 06/11/2015  . [DISCONTINUED] colestipol (COLESTID) 1 g tablet Take 1 g by mouth daily. Reported on 05/14/2015  . [DISCONTINUED] lisinopril (PRINIVIL,ZESTRIL) 40 MG tablet Take 20 mg by mouth daily.  . [DISCONTINUED] loperamide (IMODIUM) 1 MG/5ML solution Take 5 mLs (1 mg total) by mouth as needed for diarrhea or loose stools.   No facility-administered encounter medications on file as of 01/20/2016.     Allergies as  of 01/20/2016 - Review Complete 01/20/2016  Allergen Reaction Noted  . Sulfur Other (See Comments) 08/25/2010    Past Medical History:  Diagnosis Date  . Acute blood loss anemia   . Arthritis   . Atrophic gastritis   . Breast cancer ([place])    right  . Candida esophagitis ([place])   . Chest tightness   . Chronic gastritis   . Closed intertrochanteric fracture of left femur with routine healing   . Diverticulosis   . Hypertension   . IBS (irritable bowel syndrome)   . MVP (mitral valve prolapse)   . Palpitation   . Polycythemia   . PVC's (premature ventricular contractions)   . Rhinitis   . Unsteady gait     Past Surgical History:  Procedure Laterality Date  . ABDOMINAL HYSTERECTOMY     ovaries taken  . APPENDECTOMY    . BREAST LUMPECTOMY     right  . CARDIOVASCULAR STRESS TEST  12/23/2000   EF 70%  . CATARACT EXTRACTION     bilateral  . ELBOW FRACTURE SURGERY     left  . FEMUR IM NAIL Left 05/08/2015   Procedure: INTERTROCHANTERIC FEMORAL NAIL;  Surgeon: Rod Can, MD;  Location: [facility];  Service: Orthopedics;  Laterality: Left;  . HEMATOMA EVACUATION     right  . TONSILLECTOMY    . TRANSTHORACIC ECHOCARDIOGRAM  03/05/2010   EF 55-60%    Family History  Problem Relation Age of Onset  . Stomach cancer Mother   . Hypertension Father   . Stroke Father   . Hypertension Sister   . Hypertension Brother   . Prostate cancer Brother   . Colon cancer Neg Hx   . Prostate cancer Father   . Diabetes Brother     Social History   Social History  . Marital status: Widowed    Spouse name: N/A  . Number of children: 2  . Years of education: N/A   Occupational History  . retired Retired   Social History Main Topics  . Smoking status: Former Smoker    Quit date: 08/24/1980  . Smokeless tobacco: Never Used  . Alcohol use Yes     Comment: occasional  . Drug use: No  . Sexual activity: Not on file   Other Topics Concern  . Not on file   Social History  Narrative  . No narrative on file      Review of systems: Review of Systems  Constitutional: Negative for fever and chills. Positive for loss of appetite and weight loss HENT: Positive for ringing in the ears and post nasal drip   Eyes: Negative for blurred vision.  Respiratory: Negative for cough, shortness of breath and wheezing.   Cardiovascular: Negative for chest pain and palpitations.  Gastrointestinal: as per HPI Genitourinary: Negative for dysuria, urgency, frequency and hematuria.  Musculoskeletal: Positive for myalgias, back pain and joint pain.  Skin: Negative for itching and rash.  Neurological: Negative for dizziness, tremors, focal weakness, seizures and loss of consciousness.  Endo/Heme/Allergies: Negative for seasonal allergies.  Positive for polycythemia vera Psychiatric/Behavioral: Negative for depression, suicidal ideas and hallucinations.  All other systems reviewed and are negative.   Physical Exam: Vitals:   01/20/16 0848  BP: (!) 160/80  Pulse: 76   Body mass index is 17.01 kg/m. Gen:      No acute distress HEENT:  EOMI, sclera anicteric Neck:     No masses; no thyromegaly Lungs:    Clear to auscultation bilaterally; normal respiratory effort CV:         Regular rate and rhythm; no murmurs Abd:      + bowel sounds; soft, non-tender; no palpable masses, no distension Ext:    No edema; adequate peripheral perfusion Skin:      Warm and dry; no rash Neuro: alert and oriented x 3 Psych: normal mood and affect  Data Reviewed:  Reviewed labs, radiology imaging, old records and pertinent past GI work up   Assessment and Plan/Recommendations:  82 year old female with history of polycythemia vera, JAK2 mutation, thrombocytosis and irritable bowel syndrome predominant diarrhea here for follow-up visit Patient could be lactose intolerant, advise her to avoid any dairy products including ice cream and cheese for 2 weeks trial Restart cholestyramine 1-2  times daily with meals We'll also treat with rifaximin 550 g 3 times a day for irritable bowel syndrome with predominant diarrhea for 2 weeks course  Return in 3 months  25 minutes was spent face-to-face with the patient. Greater than 50% of the time used for counseling as well as treatment plan and follow-up. Patient and her daughter had multiple questions which were answered to their satisfaction  K. Denzil Magnuson , MD [phone] Mon-Fri 8a-5p [phone] after 5p, weekends, holidays  CC: Deland Pretty, MD

## 2016-01-20 NOTE — Patient Instructions (Signed)
We have sent your prescriptions to your pharmacy  Lactose-Free Diet, Adult Introduction If you have lactose intolerance, you are not able to digest lactose. Lactose is a natural sugar found mainly in milk and milk products. You may need to avoid all foods and beverages that contain lactose. A lactose-free diet can help you do this. What do I need to know about this diet?  Do not consume foods, beverages, vitamins, minerals, or medicines with lactose. Read ingredients lists carefully.  Look for the words "lactose-free" on labels.  Use lactase enzyme drops or tablets as directed by your health care provider.  Use lactose-free milk or a milk alternative, such as soy milk, for drinking and cooking.  Make sure you get enough calcium and vitamin D in your diet. A lactose-free eating plan can be lacking in these important nutrients.  Take calcium and vitamin D supplements as directed by your health care provider. Talk to your provider about supplements if you are not able to get enough calcium and vitamin D from food. Which foods have lactose? Lactose is found in:  Milk and foods made from milk.  Yogurt.  Cheese.  Butter.  Margarine.  Sour cream.  Cream.  Whipped toppings and nondairy creamers.  Ice cream and other milk-based desserts. Lactose is also found in foods or products made with milk or milk ingredients. To find out whether a food contains milk or a milk ingredient, look at the ingredients list. Avoid foods with the statement "May contain milk" and foods that contain:  Butter.  Cream.  Milk.  Milk solids.  Milk powder.  Whey.  Curd.  Caseinate.  Lactose.  Lactalbumin.  Lactoglobulin. What are some alternatives to milk and foods made with milk products?  Lactose-free milk.  Soy milk with added calcium and vitamin D.  Almond, coconut, or rice milk with added calcium and vitamin D. Note that these are low in protein.  Soy products, such as soy  yogurt, soy cheese, soy ice cream, and soy-based sour cream. Which foods can I eat? Grains  Breads and rolls made without milk, such as [place], [place], or [place] bread, bagels, pita, and [employer]. Corn tortillas, corn meal, grits, and polenta. Crackers without lactose or milk solids, such as soda crackers and graham crackers. Cooked or dry cereals without lactose or milk solids. Pasta, quinoa, couscous, barley, oats, bulgur, farro, rice, wild rice, or other grains prepared without milk or lactose. Plain popcorn. Vegetables  Fresh, frozen, and canned vegetables without cheese, cream, or butter sauces. Fruits  All fresh, canned, frozen, or dried fruits that are not processed with lactose. Meats and Other Protein Sources  Plain beef, chicken, fish, [place], lamb, veal, pork, wild game, or ham. Kosher-prepared meat products. Strained or junior meats that do not contain milk. Eggs. Soy meat substitutes. Beans, lentils, and hummus. Tofu. Nuts and seeds. Peanut or other nut butters without lactose. Soups, casseroles, and mixed dishes without cheese, cream, or milk. Dairy  Lactose-free milk. Soy, rice, or almond milk with added calcium and vitamin D. Soy cheese and yogurt. Beverages  Carbonated drinks. Tea. Coffee, freeze-dried coffee, and some instant coffees. Fruit and vegetable juices. Condiments  Soy sauce. Carob powder. Olives. Gravy made with water. Baker's cocoa. Angie Fava. Pure seasonings and spices. Ketchup. Mustard. Bouillon. Broth. Sweets and Desserts  Water and fruit ices. Gelatin. Cookies, pies, or cakes made from allowed ingredients, such as angel food cake. Pudding made with water or a milk substitute. Lactose-free tofu desserts. Soy, coconut milk, or  rice-milk-based frozen desserts. Sugar. Honey. Jam, jelly, and marmalade. Molasses. Pure sugar candy. Dark chocolate without milk. Marshmallows. Fats and Oils  Margarines and salad dressings that do not contain milk. Berniece Salines. Vegetable oils.  Shortening. Mayonnaise. Soy or coconut-based cream. The items listed above may not be a complete list of recommended foods or beverages. Contact your dietitian for more options.  Which foods are not recommended? Grains  Breads and rolls that contain milk. Toaster pastries. Muffins, biscuits, waffles, cornbread, and pancakes. These can be prepared at home, commercial, or from mixes. Sweet rolls, donuts, English muffins, fry bread, lefse, flour tortillas with lactose, or [place] toast made with milk or milk ingredients. Crackers that contain lactose. Corn curls. Cooked or dry cereals with lactose. Vegetables  Creamed or breaded vegetables. Vegetables in a cheese or butter sauce or with lactose-containing margarines. Instant potatoes. [place] fries. Scalloped or au gratin potatoes. Fruits  None. Meats and Other Protein Sources  Scrambled eggs, omelets, and souffles that contain milk. Creamed or breaded meat, fish, chicken, or [place]. Sausage products, such as wieners and liver sausage. Cold cuts that contain milk solids. Cheese, cottage cheese, ricotta cheese, and cheese spreads. Lasagna and macaroni and cheese. Pizza. Peanut or other nut butters with added milk solids. Casseroles or mixed dishes containing milk or cheese. Dairy  All dairy products, including milk, goat's milk, buttermilk, kefir, acidophilus milk, flavored milk, evaporated milk, condensed milk, dulce de [place], eggnog, yogurt, cheese, and cheese spreads. Beverages  Hot chocolate. Cocoa with lactose. Instant iced teas. Powdered fruit drinks. Smoothies made with milk or yogurt. Condiments  Chewing gum that has lactose. Cocoa that has lactose. Spice blends if they contain milk products. Artificial sweeteners that contain lactose. Nondairy creamers. Sweets and Desserts  Ice cream, ice milk, gelato, sherbet, and frozen yogurt. Custard, pudding, and mousse. Cake, cream pies, cookies, and other desserts containing milk, cream, cream cheese, or  milk chocolate. Pie crust made with milk-containing margarine or butter. Reduced-calorie desserts made with a sugar substitute that contains lactose. Toffee and butterscotch. Milk, white, or dark chocolate that contains milk. Fudge. Caramel. Fats and Oils  Margarines and salad dressings that contain milk or cheese. Cream. Half and half. Cream cheese. Sour cream. Chip dips made with sour cream or yogurt. The items listed above may not be a complete list of foods and beverages to avoid. Contact your dietitian for more information.  Am I getting enough calcium? Calcium is found in many foods that contain lactose and is important for bone health. The amount of calcium you need depends on your age:  Adults younger than 50 years: 1000 mg of calcium a day.  Adults older than 50 years: 1200 mg of calcium a day. If you are not getting enough calcium, other calcium sources include:  Orange juice with calcium added. There are 300-350 mg of calcium in 1 cup of orange juice.  Sardines with edible bones. There are 325 mg of calcium in 3 oz of sardines.  Calcium-fortified soy milk. There are 300-400 mg of calcium in 1 cup of calcium-fortified soy milk.  Calcium-fortified rice or almond milk. There are 300 mg of calcium in 1 cup of calcium-fortified rice or almond milk.  Canned salmon with edible bones. There are 180 mg of calcium in 3 oz of canned salmon with edible bones.  Calcium-fortified breakfast cereals. There are [phone] mg of calcium in calcium-fortified breakfast cereals.  Tofu set with calcium sulfate. There are 250 mg of calcium in  cup of tofu  set with calcium sulfate.  Spinach, cooked. There are 145 mg of calcium in  cup of cooked spinach.  Edamame, cooked. There are 130 mg of calcium in  cup of cooked edamame.  Collard greens, cooked. There are 125 mg of calcium in  cup of cooked collard greens.  Kale, frozen or cooked. There are 90 mg of calcium in  cup of cooked or frozen  kale.  Almonds. There are 95 mg of calcium in  cup of almonds.  Broccoli, cooked. There are 60 mg of calcium in 1 cup of cooked broccoli. This information is not intended to replace advice given to you by your health care provider. Make sure you discuss any questions you have with your health care provider. Document Released: 07/10/2001 Document Revised: 06/26/2015 Document Reviewed: 04/20/2013  2017 Elsevier

## 2016-01-20 NOTE — Telephone Encounter (Signed)
We discussed it at length during office visit this AM. She said Colestipol was helping to improve diarrhea and her symptoms were worse when she didn't take it. I advised her to take it 1-2 times daily as needed

## 2016-01-22 ENCOUNTER — Telehealth: Payer: Self-pay | Admitting: Gastroenterology

## 2016-01-23 NOTE — Telephone Encounter (Signed)
Called back to [facility]. No answer. DPR on file. Left information in her voicemail. Antibiotic samples at the front desk for the family to pick up.

## 2016-01-28 ENCOUNTER — Other Ambulatory Visit (HOSPITAL_BASED_OUTPATIENT_CLINIC_OR_DEPARTMENT_OTHER): Payer: Medicare Other

## 2016-01-28 DIAGNOSIS — D500 Iron deficiency anemia secondary to blood loss (chronic): Secondary | ICD-10-CM

## 2016-01-28 DIAGNOSIS — D45 Polycythemia vera: Secondary | ICD-10-CM

## 2016-01-28 LAB — CBC WITH DIFFERENTIAL/PLATELET
BASO%: 0.6 % (ref 0.0–2.0)
BASOS ABS: 0.1 10*3/uL (ref 0.0–0.1)
EOS ABS: 0.5 10*3/uL (ref 0.0–0.5)
EOS%: 3.8 % (ref 0.0–7.0)
HCT: 41.2 % (ref 34.8–46.6)
HGB: 12.5 g/dL (ref 11.6–15.9)
LYMPH%: 5.6 % — AB (ref 14.0–49.7)
MCH: 19.5 pg — ABNORMAL LOW (ref 25.1–34.0)
MCHC: 30.2 g/dL — AB (ref 31.5–36.0)
MCV: 64.7 fL — AB (ref 79.5–101.0)
MONO#: 0.7 10*3/uL (ref 0.1–0.9)
MONO%: 4.9 % (ref 0.0–14.0)
NEUT#: 11.7 10*3/uL — ABNORMAL HIGH (ref 1.5–6.5)
NEUT%: 85.1 % — AB (ref 38.4–76.8)
Platelets: 563 10*3/uL — ABNORMAL HIGH (ref 145–400)
RBC: 6.38 10*6/uL — AB (ref 3.70–5.45)
RDW: 23.3 % — ABNORMAL HIGH (ref 11.2–14.5)
WBC: 13.7 10*3/uL — ABNORMAL HIGH (ref 3.9–10.3)
lymph#: 0.8 10*3/uL — ABNORMAL LOW (ref 0.9–3.3)

## 2016-01-28 LAB — FERRITIN: FERRITIN: 26 ng/mL (ref 9–269)

## 2016-02-09 ENCOUNTER — Other Ambulatory Visit: Payer: Self-pay | Admitting: Hematology

## 2016-02-09 DIAGNOSIS — D45 Polycythemia vera: Secondary | ICD-10-CM

## 2016-02-11 ENCOUNTER — Other Ambulatory Visit (HOSPITAL_BASED_OUTPATIENT_CLINIC_OR_DEPARTMENT_OTHER): Payer: Medicare Other

## 2016-02-11 DIAGNOSIS — D500 Iron deficiency anemia secondary to blood loss (chronic): Secondary | ICD-10-CM

## 2016-02-11 DIAGNOSIS — D45 Polycythemia vera: Secondary | ICD-10-CM | POA: Diagnosis present

## 2016-02-11 LAB — CBC WITH DIFFERENTIAL/PLATELET
BASO%: 0.6 % (ref 0.0–2.0)
Basophils Absolute: 0.1 10*3/uL (ref 0.0–0.1)
EOS ABS: 0.3 10*3/uL (ref 0.0–0.5)
EOS%: 2.3 % (ref 0.0–7.0)
HCT: 46.2 % (ref 34.8–46.6)
HEMOGLOBIN: 14.2 g/dL (ref 11.6–15.9)
LYMPH#: 0.7 10*3/uL — AB (ref 0.9–3.3)
LYMPH%: 5.7 % — ABNORMAL LOW (ref 14.0–49.7)
MCH: 21.4 pg — ABNORMAL LOW (ref 25.1–34.0)
MCHC: 30.8 g/dL — ABNORMAL LOW (ref 31.5–36.0)
MCV: 69.5 fL — AB (ref 79.5–101.0)
MONO#: 0.6 10*3/uL (ref 0.1–0.9)
MONO%: 5.5 % (ref 0.0–14.0)
NEUT%: 85.9 % — ABNORMAL HIGH (ref 38.4–76.8)
NEUTROS ABS: 9.9 10*3/uL — AB (ref 1.5–6.5)
PLATELETS: 571 10*3/uL — AB (ref 145–400)
RBC: 6.65 10*6/uL — ABNORMAL HIGH (ref 3.70–5.45)
RDW: 30.8 % — AB (ref 11.2–14.5)
WBC: 11.6 10*3/uL — ABNORMAL HIGH (ref 3.9–10.3)

## 2016-02-11 LAB — FERRITIN: Ferritin: 35 ng/ml (ref 9–269)

## 2016-02-16 ENCOUNTER — Telehealth: Payer: Self-pay | Admitting: *Deleted

## 2016-02-16 NOTE — Telephone Encounter (Signed)
Called pt on cell phone - voice mail not set up yet.  Called pt at home and left message requesting a call back for Dr. Ernestina Penna instructions, and dose change of Anagrelide.

## 2016-02-16 NOTE — Telephone Encounter (Signed)
Pt returned call.  Informed pt that she can stop oral iron for now - her iron deficient anemia has resolved.  Instructed pt to increase Anagrelide to 1.5 mg  Twice  Daily  Starting today as per Dr. Burr Medico - platelet count still high.  Pt voiced understanding.

## 2016-02-16 NOTE — Telephone Encounter (Signed)
-----   Message from Truitt Merle, MD sent at 02/14/2016 10:16 PM EST ----- Please call pt and let her know that her iron deficient anemia has resolved, she can stop oral iron now, otherwise she may need phlebotomy next time. Her platelet count still high, please instruct her to increase anagrelide to 1.5 mg twice daily (she is on 1.5mg  am and 1.0mg  pm now)  Thanks  Truitt Merle

## 2016-02-25 ENCOUNTER — Other Ambulatory Visit (HOSPITAL_BASED_OUTPATIENT_CLINIC_OR_DEPARTMENT_OTHER): Payer: Medicare Other

## 2016-02-25 DIAGNOSIS — D45 Polycythemia vera: Secondary | ICD-10-CM | POA: Diagnosis present

## 2016-02-25 DIAGNOSIS — D500 Iron deficiency anemia secondary to blood loss (chronic): Secondary | ICD-10-CM

## 2016-02-25 LAB — CBC WITH DIFFERENTIAL/PLATELET
BASO%: 0.2 % (ref 0.0–2.0)
Basophils Absolute: 0 10*3/uL (ref 0.0–0.1)
EOS%: 1.6 % (ref 0.0–7.0)
Eosinophils Absolute: 0.2 10*3/uL (ref 0.0–0.5)
HEMATOCRIT: 51.2 % — AB (ref 34.8–46.6)
HEMOGLOBIN: 16.2 g/dL — AB (ref 11.6–15.9)
LYMPH#: 0.5 10*3/uL — AB (ref 0.9–3.3)
LYMPH%: 5.1 % — ABNORMAL LOW (ref 14.0–49.7)
MCH: 22.6 pg — ABNORMAL LOW (ref 25.1–34.0)
MCHC: 31.6 g/dL (ref 31.5–36.0)
MCV: 71.5 fL — ABNORMAL LOW (ref 79.5–101.0)
MONO#: 0.7 10*3/uL (ref 0.1–0.9)
MONO%: 6.4 % (ref 0.0–14.0)
NEUT%: 86.7 % — AB (ref 38.4–76.8)
NEUTROS ABS: 9 10*3/uL — AB (ref 1.5–6.5)
Platelets: 315 10*3/uL (ref 145–400)
RBC: 7.16 10*6/uL — ABNORMAL HIGH (ref 3.70–5.45)
RDW: 26.9 % — ABNORMAL HIGH (ref 11.2–14.5)
WBC: 10.4 10*3/uL — ABNORMAL HIGH (ref 3.9–10.3)
nRBC: 0 % (ref 0–0)

## 2016-02-25 LAB — FERRITIN: FERRITIN: 26 ng/mL (ref 9–269)

## 2016-03-05 DIAGNOSIS — M810 Age-related osteoporosis without current pathological fracture: Secondary | ICD-10-CM | POA: Diagnosis not present

## 2016-03-08 NOTE — Progress Notes (Signed)
[facility] OFF[facility] PROGRESS NOTE DATE OF VISIT: 03/10/2016   Kelly Pel, MD [address] [place] [address]  DIAGNOSIS: Polycythemia vera ([place])  History of breast cancer in female  Osteoporosis, unspecified osteoporosis type, unspecified pathological fracture presence, Breast Cancer   PREVIOUS AND CURRENT THERAPY:  1. Phlebotomy if hematocrit is greater than or equal to 42% (changed to 45% in May 2016). The patient receives 300-400 mL of normal saline following each 2/1-1-unit phlebotomy  2. Anagrelide 0.5 mg twice daily and aspirin 81 mg daily, anagrelide increased to 1.0mg  AM and 0.5mg  PM since Feb 2016, and 1mg  twice daily on 12/03/2015, increased to 1.5mg  bid in 02/2016  3. She previously tried Hydrea 500mg  daily, developed thrombocytopenia, was subsequently held.  INTERVAL HISTORY:  [name] 83 y.o. female with a history of polycythemia vera (04/21/2007) with positive JAK2 mutation is here for follow-up. She is doing well overall, denies any new pain or other complains.   MEDICAL HISTORY: Past Medical History:  Diagnosis Date  . Acute blood loss anemia   . Arthritis   . Atrophic gastritis   . Breast cancer ([place])    right  . Candida esophagitis ([place])   . Chest tightness   . Chronic gastritis   . Closed intertrochanteric fracture of left femur with routine healing   . Diverticulosis   . Hypertension   . IBS (irritable bowel syndrome)   . MVP (mitral valve prolapse)   . Palpitation   . Polycythemia   . PVC's (premature ventricular contractions)   . Rhinitis   . Unsteady gait     INTERIM HISTORY: has Malignant neoplasm of female breast ([place]); Polycythemia vera ([place]); Atrophic gastritis; DIVERTICULOSIS OF COLON; ARTHRITIS; ABDOMINAL PAIN, EPIGASTRIC; HTN (hypertension); MVP (mitral valve prolapse); Closed left hip fracture ([place]); Leukocytosis; Fracture, intertrochanteric, left femur ([place]); Faintness; Acute blood  loss anemia; Thrombocytosis ([place]); Dehydration; Osteoporosis; and History of breast cancer in female on her problem list.    ALLERGIES:  is allergic to sulfur.  MEDICATIONS: has a current medication list which includes the following prescription(s): amlodipine, anagrelide, aspirin, calcium carbonate-vitamin d, cetirizine hcl, vitamin d3, colestipol, fluticasone, hydrochlorothiazide, lisinopril, loperamide, metoprolol succinate, multivitamin, pantoprazole, potassium chloride, and probiotic product.  SURGICAL HISTORY:  Past Surgical History:  Procedure Laterality Date  . ABDOMINAL HYSTERECTOMY     ovaries taken  . APPENDECTOMY    . BREAST LUMPECTOMY     right  . CARDIOVASCULAR STRESS TEST  12/23/2000   EF 70%  . CATARACT EXTRACTION     bilateral  . ELBOW FRACTURE SURGERY     left  . FEMUR IM NAIL Left 05/08/2015   Procedure: INTERTROCHANTERIC FEMORAL NAIL;  Surgeon: Rod Can, MD;  Location: [facility];  Service: Orthopedics;  Laterality: Left;  . HEMATOMA EVACUATION     right  . TONSILLECTOMY    . TRANSTHORACIC ECHOCARDIOGRAM  03/05/2010   EF 55-60%   PROBLEM LIST:  1. Polycythemia vera diagnosed in March 2009. JAK2 mutation was detected on 04/21/2007. Hemoglobin in April 2009 was 18.4 with hematocrit of 55.0. Platelet count on July 12, 2007, was 868,000. The patient has never had a bone marrow exam. She had been undergoing phlebotomies in the past, approximately once a month, for hematocrit greater than or equal to 45%. The patient has been on anagrelide 0.5 mg twice daily, as well as aspirin. She has not had any thrombotic complications. We will be carrying out a 1-unit phlebotomy whenever the hematocrit is  greater than or equal to 42%. The patient does require normal saline 300 mL after phlebotomy because of symptomatic hypotension.  2. 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  Lumpectomy on 01/04/2006 followed by radiation treatments under the direction of Dr. Gery Pray. The patient has not required any systemic therapy. She does have yearly mammograms.  3. Mitral valve prolapse.  4. Hypertension.  5. Diverticulosis.  6. History of esophageal stricture.  7. History of gastritis.  8. History of pelvic fractures after falling on August 23, 2006.  REVIEW OF SYSTEMS:   Constitutional: Denies fevers, chills or abnormal weight loss Eyes: Denies blurriness of vision Ears, nose, mouth, throat, and face: Denies mucositis or sore throat Respiratory: Denies cough, dyspnea or wheezes Cardiovascular: Denies palpitation, chest discomfort or lower extremity swelling Gastrointestinal:  Denies nausea, heartburn or change in bowel habits Skin: Denies abnormal skin rashes Lymphatics: Denies new lymphadenopathy or easy bruising Neurological:Denies numbness, tingling or new weaknesses Behavioral/Psych: Mood is stable, no new changes  All other systems were reviewed with the patient and are negative.  PHYSICAL EXAMINATION: ECOG PERFORMANCE STATUS: 0  Blood pressure (!) 192/76, pulse 74, resp. rate 18, height 5\' 3"  (1.6 m), weight 89 lb (40.4 kg), SpO2 97 %.  GENERAL:alert, no distress and comfortable; she has scoliosis and looks her stated age.  SKIN: skin color, texture, turgor are normal, no rashes but seborrheic keratosis.  EYES: normal, Conjunctiva are pink and non-injected, sclera clear OROPHARYNX:no exudate, no erythema and lips, buccal mucosa, and tongue normal  NECK: supple, thyroid normal size, non-tender, without nodularity LYMPH:  no palpable lymphadenopathy in the cervical, axillary or supraclavicular LUNGS: clear to auscultation and percussion with normal breathing effort HEART: regular rate & rhythm and no murmurs and no lower extremity edema ABDOMEN:abdomen soft, non-tender and normal bowel sounds Musculoskeletal: (+) Mild tenderness at the right hip and pelvic area. The range of motion of right hip is normal. NEURO: alert & oriented x 3 with  fluent speech, no focal motor/sensory deficits Breasts: Breast inspection showed them to be symmetrical with no nipple discharge. Status post right lumpectomy change.  Palpation of the breasts and axilla revealed no obvious mass that I could appreciate.   LABORATORY DATA: CBC Latest Ref Rng & Units 03/10/2016 02/25/2016 02/11/2016  WBC 3.9 - 10.3 10e3/uL 12.0(H) 10.4(H) 11.6(H)  Hemoglobin 11.6 - 15.9 g/dL 15.8 16.2(H) 14.2  Hematocrit 34.8 - 46.6 % 51.3(H) 51.2(H) 46.2  Platelets 145 - 400 10e3/uL 523(H) 315 571(H)    CMP Latest Ref Rng & Units 12/03/2015 08/13/2015 06/11/2015  Glucose 70 - 140 mg/dl 83 95 149(H)  BUN 7.0 - 26.0 mg/dL 23.0 18.9 16.1  Creatinine 0.6 - 1.1 mg/dL 1.2(H) 0.9 1.0  Sodium 136 - 145 mEq/L 137 134(L) 127(L)  Potassium 3.5 - 5.1 mEq/L 4.5 4.9 4.2  Chloride 101 - 111 mmol/L - - -  CO2 22 - 29 mEq/L 27 25 25   Calcium 8.4 - 10.4 mg/dL 9.6 9.9 9.9  Total Protein 6.4 - 8.3 g/dL 6.8 6.6 6.4  Total Bilirubin 0.20 - 1.20 mg/dL 0.40 0.38 0.60  Alkaline Phos 40 - 150 U/L 85 123 160(H)  AST 5 - 34 U/L 20 17 16   ALT 0 - 55 U/L 14 12 12      IMAGING STUDIES:  1. Ultrasound of the abdomen, limited, on 01/21/2006, showed a splenic volume of 73 cc with maximum ultrasound dimension of 8.8 cm. Spleen measured 8.8 x 4.1 x 3.9 cm, for a  volume of 73 cc. No focal lesions were identified.  2. Diagnostic bilateral digital mammogram carried out at [facility] on 01/01/2011 showed no suspicious findings.  3. Mammogram, diagnostic, bilateral on 01/04/2012 showed no significant abnormalities. There were post lumpectomy changes present involving the right breast.   ASSESSMENT: [name] 83 y.o. female with a history of Polycythemia vera ([place])  History of breast cancer in female  Osteoporosis, unspecified osteoporosis type, unspecified pathological fracture presence   PLAN:  1. Myeloproliferative neoplasm, Polycythemia vera, JAK2(+)  --The patient is doing well  clinically. -Due to her mild anemia, iron deficiency, and fatigue,she took some oral iron for less than a month, and her H/H has significantly increased. HCT 51.3% today, she will need phlebotomy today and next month  -She previously did not tolerate Hydrea due to thrombocytopenia. -We previously discussed this is a incurable disease, most people have indolent and benign course, the major complication is thrombosis. Some patients may develop leukemia and myelofibrosis at the end.  2. History of right breast DCIS  --Involving the right breast with biopsy on 11/29/2005.  Lumpectomy on 01/04/2006 followed by radiation treatments under the direction of Dr. Gery Pray.  -She is still on annual screening mammogram surveillance, will continue.   3. L2 compression fracture in 04/2014, left femoral fracture in April 2017, osteoporosis -Her pain and the morbility has improved much after rehab  -Continue supportive care and symptom management by her primary care physician -She is on Prolia with her PCP, I encourage her to continue calcium and vitamin D 2 tablets a day   Plan -due to her thrombocytosis, I will increase her anagrelide to 2mg  twice daily, I refilled for her  - phlebotomy today and next month  -lab in 2, 4 and 8 weeks -f/u in 8 weeks  All questions were answered. The patient knows to call the clinic with any problems, questions or concerns. We can certainly see the patient much sooner if necessary.  Patient was provided a handout on polycythemia vera.   I spent 15 minutes counseling the patient face to face. The total time spent in the appointment was 20 minutes.   Truitt Merle  03/10/2016

## 2016-03-10 ENCOUNTER — Other Ambulatory Visit: Payer: Self-pay | Admitting: *Deleted

## 2016-03-10 ENCOUNTER — Other Ambulatory Visit (HOSPITAL_BASED_OUTPATIENT_CLINIC_OR_DEPARTMENT_OTHER): Payer: Medicare Other

## 2016-03-10 ENCOUNTER — Telehealth: Payer: Self-pay | Admitting: *Deleted

## 2016-03-10 ENCOUNTER — Ambulatory Visit (HOSPITAL_BASED_OUTPATIENT_CLINIC_OR_DEPARTMENT_OTHER): Payer: Medicare Other | Admitting: Hematology

## 2016-03-10 VITALS — BP 192/76 | HR 74 | Resp 18 | Ht 63.0 in | Wt 89.0 lb

## 2016-03-10 DIAGNOSIS — D45 Polycythemia vera: Secondary | ICD-10-CM | POA: Diagnosis not present

## 2016-03-10 DIAGNOSIS — Z853 Personal history of malignant neoplasm of breast: Secondary | ICD-10-CM | POA: Diagnosis not present

## 2016-03-10 DIAGNOSIS — M25472 Effusion, left ankle: Secondary | ICD-10-CM

## 2016-03-10 DIAGNOSIS — M810 Age-related osteoporosis without current pathological fracture: Secondary | ICD-10-CM | POA: Diagnosis not present

## 2016-03-10 DIAGNOSIS — M25473 Effusion, unspecified ankle: Secondary | ICD-10-CM

## 2016-03-10 DIAGNOSIS — D500 Iron deficiency anemia secondary to blood loss (chronic): Secondary | ICD-10-CM

## 2016-03-10 DIAGNOSIS — M25476 Effusion, unspecified foot: Secondary | ICD-10-CM

## 2016-03-10 LAB — CBC WITH DIFFERENTIAL/PLATELET
BASO%: 0.3 % (ref 0.0–2.0)
Basophils Absolute: 0 10*3/uL (ref 0.0–0.1)
EOS%: 1.6 % (ref 0.0–7.0)
Eosinophils Absolute: 0.2 10*3/uL (ref 0.0–0.5)
HEMATOCRIT: 51.3 % — AB (ref 34.8–46.6)
HGB: 15.8 g/dL (ref 11.6–15.9)
LYMPH#: 0.6 10*3/uL — AB (ref 0.9–3.3)
LYMPH%: 4.7 % — ABNORMAL LOW (ref 14.0–49.7)
MCH: 22 pg — AB (ref 25.1–34.0)
MCHC: 30.8 g/dL — ABNORMAL LOW (ref 31.5–36.0)
MCV: 71.4 fL — ABNORMAL LOW (ref 79.5–101.0)
MONO#: 0.7 10*3/uL (ref 0.1–0.9)
MONO%: 5.9 % (ref 0.0–14.0)
NEUT%: 87.5 % — ABNORMAL HIGH (ref 38.4–76.8)
NEUTROS ABS: 10.5 10*3/uL — AB (ref 1.5–6.5)
Platelets: 523 10*3/uL — ABNORMAL HIGH (ref 145–400)
RBC: 7.19 10*6/uL — ABNORMAL HIGH (ref 3.70–5.45)
RDW: 26.6 % — ABNORMAL HIGH (ref 11.2–14.5)
WBC: 12 10*3/uL — AB (ref 3.9–10.3)

## 2016-03-10 LAB — FERRITIN: FERRITIN: 6 ng/mL — AB (ref 9–269)

## 2016-03-10 MED ORDER — SODIUM CHLORIDE 0.9 % IV SOLN: INTRAVENOUS | Status: AC

## 2016-03-10 MED ORDER — ANAGRELIDE HCL 1 MG PO CAPS: 2.0000 mg | ORAL_CAPSULE | Freq: Two times a day (BID) | ORAL | 2 refills | Status: DC

## 2016-03-10 NOTE — Telephone Encounter (Signed)
Per 2/7 LOS and staff message I have scheduled appt and notified the scheduler

## 2016-03-10 NOTE — Telephone Encounter (Signed)
Attempted to call pt on both cell and home phones without answers.   Spoke with son Eduard Clos and gave him appt date and time along with instructions for pt : 1.   Go to admitting dept at 1045 am on Thursday  03/11/16 to register for Doppler study at 11 am at [facility]. 2.   Pt to have phlebotomy at [facility] at 230 pm  03/11/16 after doppler.  Asked Eduard Clos to make sure pt eats before procedure. Charlie voiced understanding and stated he would relay message to pt.

## 2016-03-11 ENCOUNTER — Encounter (HOSPITAL_COMMUNITY): Payer: Medicare Other

## 2016-03-11 ENCOUNTER — Ambulatory Visit (HOSPITAL_COMMUNITY)
Admission: RE | Admit: 2016-03-11 | Discharge: 2016-03-11 | Disposition: A | Discharge status: Home / Self Care | Payer: Medicare Other | Source: Ambulatory Visit | Attending: Hematology | Admitting: Hematology

## 2016-03-11 ENCOUNTER — Ambulatory Visit (HOSPITAL_BASED_OUTPATIENT_CLINIC_OR_DEPARTMENT_OTHER): Payer: Medicare Other

## 2016-03-11 VITALS — BP 170/69 | HR 74 | Temp 98.3°F | Resp 16

## 2016-03-11 DIAGNOSIS — D45 Polycythemia vera: Secondary | ICD-10-CM | POA: Insufficient documentation

## 2016-03-11 DIAGNOSIS — M25473 Effusion, unspecified ankle: Secondary | ICD-10-CM

## 2016-03-11 DIAGNOSIS — M25476 Effusion, unspecified foot: Secondary | ICD-10-CM | POA: Insufficient documentation

## 2016-03-11 DIAGNOSIS — D751 Secondary polycythemia: Secondary | ICD-10-CM

## 2016-03-11 DIAGNOSIS — M810 Age-related osteoporosis without current pathological fracture: Secondary | ICD-10-CM | POA: Diagnosis not present

## 2016-03-11 DIAGNOSIS — Z853 Personal history of malignant neoplasm of breast: Secondary | ICD-10-CM | POA: Diagnosis not present

## 2016-03-11 DIAGNOSIS — M25475 Effusion, left foot: Secondary | ICD-10-CM

## 2016-03-11 MED ORDER — SODIUM CHLORIDE 0.9 % IV SOLN
Freq: Once | INTRAVENOUS | Status: AC
  Administered 2016-03-11: 14:00:00 via INTRAVENOUS

## 2016-03-11 NOTE — Progress Notes (Signed)
*  PRELIMINARY RESULTS* Vascular Ultrasound Bilateral lower extremity venous duplex has been completed.  Preliminary findings: No evidence of deep vein thrombosis or baker's cysts bilaterally.   Everrett Coombe 03/11/2016, 11:19 AM

## 2016-03-11 NOTE — Patient Instructions (Signed)
Therapeutic Phlebotomy Therapeutic phlebotomy is the controlled removal of blood from a person's body for the purpose of treating a medical condition. The procedure is similar to donating blood. Usually, about a pint (470 mL, or 0.47L) of blood is removed. The average adult has 9-12 pints (4.3-5.7 L) of blood. Therapeutic phlebotomy may be used to treat the following medical conditions:  Hemochromatosis. This is a condition in which the blood contains too much iron.  Polycythemia vera. This is a condition in which the blood contains too many red blood cells.  Porphyria cutanea tarda. This is a disease in which an important part of hemoglobin is not made properly. It results in the buildup of abnormal amounts of porphyrins in the body.  Sickle cell disease. This is a condition in which the red blood cells form an abnormal crescent shape rather than a round shape. Tell a health care provider about:  Any allergies you have.  All medicines you are taking, including vitamins, herbs, eye drops, creams, and over-the-counter medicines.  Any problems you or family members have had with anesthetic medicines.  Any blood disorders you have.  Any surgeries you have had.  Any medical conditions you have. What are the risks? Generally, this is a safe procedure. However, problems may occur, including:  Nausea or light-headedness.  Low blood pressure.  Soreness, bleeding, swelling, or bruising at the needle insertion site.  Infection. What happens before the procedure?  Follow instructions from your health care provider about eating or drinking restrictions.  Ask your health care provider about changing or stopping your regular medicines. This is especially important if you are taking diabetes medicines or blood thinners.  Wear clothing with sleeves that can be raised above the elbow.  Plan to have someone take you home after the procedure.  You may have a blood sample taken. What happens  during the procedure?  A needle will be inserted into one of your veins.  Tubing and a collection bag will be attached to that needle.  Blood will flow through the needle and tubing into the collection bag.  You may be asked to open and close your hand slowly and continually during the entire collection.  After the specified amount of blood has been removed from your body, the collection bag and tubing will be clamped.  The needle will be removed from your vein.  Pressure will be held on the site of the needle insertion to stop the bleeding.  A bandage (dressing) will be placed over the needle insertion site. The procedure may vary among health care providers and hospitals. What happens after the procedure?  Your recovery will be assessed and monitored.  You can return to your normal activities as directed by your health care provider. This information is not intended to replace advice given to you by your health care provider. Make sure you discuss any questions you have with your health care provider. Document Released: 06/22/2010 Document Revised: 09/20/2015 Document Reviewed: 01/14/2014 Elsevier Interactive Patient Education  2017 [employer]  

## 2016-03-13 ENCOUNTER — Encounter: Payer: Self-pay | Admitting: Hematology

## 2016-03-16 ENCOUNTER — Telehealth: Payer: Self-pay | Admitting: *Deleted

## 2016-03-16 NOTE — Telephone Encounter (Signed)
-----   Message from Truitt Merle, MD sent at 03/13/2016  8:48 PM EST ----- Let pt know that her [place] was negative for DVT.  Truitt Merle  03/13/2016

## 2016-03-16 NOTE — Telephone Encounter (Signed)
Called pt & informed of negative doppler result per Dr Ernestina Penna instructions.

## 2016-03-24 ENCOUNTER — Other Ambulatory Visit (HOSPITAL_BASED_OUTPATIENT_CLINIC_OR_DEPARTMENT_OTHER): Payer: Medicare Other

## 2016-03-24 DIAGNOSIS — D500 Iron deficiency anemia secondary to blood loss (chronic): Secondary | ICD-10-CM

## 2016-03-24 DIAGNOSIS — D45 Polycythemia vera: Secondary | ICD-10-CM

## 2016-03-24 LAB — COMPREHENSIVE METABOLIC PANEL
ALT: 8 U/L (ref 0–55)
AST: 10 U/L (ref 5–34)
Albumin: 3.6 g/dL (ref 3.5–5.0)
Alkaline Phosphatase: 80 U/L (ref 40–150)
Anion Gap: 7 mEq/L (ref 3–11)
BUN: 28.4 mg/dL — AB (ref 7.0–26.0)
CALCIUM: 9.2 mg/dL (ref 8.4–10.4)
CHLORIDE: 102 meq/L (ref 98–109)
CO2: 24 meq/L (ref 22–29)
CREATININE: 1.4 mg/dL — AB (ref 0.6–1.1)
EGFR: 34 mL/min/{1.73_m2} — ABNORMAL LOW (ref >90)
Glucose: 101 mg/dl (ref 70–140)
Potassium: 4.9 mEq/L (ref 3.5–5.1)
Sodium: 133 mEq/L — ABNORMAL LOW (ref 136–145)
TOTAL PROTEIN: 6.2 g/dL — AB (ref 6.4–8.3)
Total Bilirubin: 0.34 mg/dL (ref 0.20–1.20)

## 2016-03-24 LAB — CBC WITH DIFFERENTIAL/PLATELET
BASO%: 0.2 % (ref 0.0–2.0)
BASOS ABS: 0 10*3/uL (ref 0.0–0.1)
EOS ABS: 0.3 10*3/uL (ref 0.0–0.5)
EOS%: 1.9 % (ref 0.0–7.0)
HEMATOCRIT: 45.4 % (ref 34.8–46.6)
HGB: 14.3 g/dL (ref 11.6–15.9)
LYMPH%: 5.7 % — AB (ref 14.0–49.7)
MCH: 21.8 pg — AB (ref 25.1–34.0)
MCHC: 31.5 g/dL (ref 31.5–36.0)
MCV: 69.1 fL — AB (ref 79.5–101.0)
MONO#: 0.7 10*3/uL (ref 0.1–0.9)
MONO%: 5 % (ref 0.0–14.0)
NEUT#: 11.6 10*3/uL — ABNORMAL HIGH (ref 1.5–6.5)
NEUT%: 87.2 % — AB (ref 38.4–76.8)
PLATELETS: 342 10*3/uL (ref 145–400)
RBC: 6.57 10*6/uL — ABNORMAL HIGH (ref 3.70–5.45)
RDW: 22.7 % — ABNORMAL HIGH (ref 11.2–14.5)
WBC: 13.3 10*3/uL — ABNORMAL HIGH (ref 3.9–10.3)
lymph#: 0.8 10*3/uL — ABNORMAL LOW (ref 0.9–3.3)
nRBC: 0 % (ref 0–0)

## 2016-03-24 LAB — TECHNOLOGIST REVIEW

## 2016-03-24 LAB — FERRITIN: Ferritin: 5 ng/ml — ABNORMAL LOW (ref 9–269)

## 2016-03-30 ENCOUNTER — Other Ambulatory Visit: Payer: Self-pay | Admitting: Cardiovascular Disease

## 2016-04-07 ENCOUNTER — Other Ambulatory Visit (HOSPITAL_BASED_OUTPATIENT_CLINIC_OR_DEPARTMENT_OTHER): Payer: Medicare Other

## 2016-04-07 DIAGNOSIS — D500 Iron deficiency anemia secondary to blood loss (chronic): Secondary | ICD-10-CM

## 2016-04-07 DIAGNOSIS — D45 Polycythemia vera: Secondary | ICD-10-CM | POA: Diagnosis present

## 2016-04-07 LAB — CBC WITH DIFFERENTIAL/PLATELET
BASO%: 0.2 % (ref 0.0–2.0)
BASOS ABS: 0 10*3/uL (ref 0.0–0.1)
EOS%: 2.2 % (ref 0.0–7.0)
Eosinophils Absolute: 0.3 10*3/uL (ref 0.0–0.5)
HCT: 45.1 % (ref 34.8–46.6)
HGB: 14 g/dL (ref 11.6–15.9)
LYMPH#: 0.6 10*3/uL — AB (ref 0.9–3.3)
LYMPH%: 5 % — AB (ref 14.0–49.7)
MCH: 21.1 pg — AB (ref 25.1–34.0)
MCHC: 31.2 g/dL — AB (ref 31.5–36.0)
MCV: 67.7 fL — ABNORMAL LOW (ref 79.5–101.0)
MONO#: 0.8 10*3/uL (ref 0.1–0.9)
MONO%: 6 % (ref 0.0–14.0)
NEUT#: 11.2 10*3/uL — ABNORMAL HIGH (ref 1.5–6.5)
NEUT%: 86.6 % — AB (ref 38.4–76.8)
Platelets: 291 10*3/uL (ref 145–400)
RBC: 6.66 10*6/uL — AB (ref 3.70–5.45)
RDW: 23.8 % — ABNORMAL HIGH (ref 11.2–14.5)
WBC: 12.9 10*3/uL — ABNORMAL HIGH (ref 3.9–10.3)

## 2016-04-07 LAB — FERRITIN: FERRITIN: 5 ng/mL — AB (ref 9–269)

## 2016-04-25 ENCOUNTER — Other Ambulatory Visit: Payer: Self-pay | Admitting: Cardiovascular Disease

## 2016-04-26 NOTE — Progress Notes (Signed)
[facility] PROGRESS NOTE DATE OF VISIT: 05/05/2016   [name] [address]  DIAGNOSIS: Polycythemia vera ([place]) - Plan: anagrelide (AGRYLIN) 1 MG capsule, 0.9 %  sodium chloride infusion  Osteoporosis, unspecified osteoporosis type, unspecified pathological fracture presence - Plan: anagrelide (AGRYLIN) 1 MG capsule  History of breast cancer in female - Plan: anagrelide (AGRYLIN) 1 MG capsule  Bilateral swelling of feet and ankles - Plan: anagrelide (AGRYLIN) 1 MG capsule, Breast Cancer   PREVIOUS AND CURRENT THERAPY:  1. Phlebotomy if hematocrit is greater than or equal to 42% (changed to 45% in May 2016). The patient receives 300-400 mL of normal saline following each 2/1-1-unit phlebotomy  2. Anagrelide 0.5 mg twice daily and aspirin 81 mg daily, anagrelide increased to 1.0mg  AM and 0.5mg  PM since Feb 2016, and 1mg  twice daily on 12/03/2015, increased to 1.5mg  bid in 02/2016. Increased to 2mg  BID in 03/2016. 3. She previously tried Hydrea 500mg  daily, developed thrombocytopenia, was subsequently held.  INTERVAL HISTORY:  [name] 83 y.o. female with a history of polycythemia vera (04/21/2007) with positive JAK2 mutation is here for follow-up. She is doing well overall. She denies issues with Anagrelide. She denied issues with phlebotomy during the last visit. She has occasional diarrhea and takes Imodium PRN. She reports keeping up with her medications and uses a pill box.  MEDICAL HISTORY: Past Medical History:  Diagnosis Date  . Acute blood loss anemia   . Arthritis   . Atrophic gastritis   . Breast cancer ([place])    right  . Candida esophagitis ([place])   . Chest tightness   . Chronic gastritis   . Closed intertrochanteric fracture of left femur with routine healing   . Diverticulosis   . Hypertension   . IBS (irritable bowel syndrome)   . MVP (mitral valve prolapse)   . Palpitation   .  Polycythemia   . PVC's (premature ventricular contractions)   . Rhinitis   . Unsteady gait     INTERIM HISTORY: has Malignant neoplasm of female breast ([place]); Polycythemia vera ([place]); Atrophic gastritis; DIVERTICULOSIS OF COLON; ARTHRITIS; ABDOMINAL PAIN, EPIGASTRIC; HTN (hypertension); MVP (mitral valve prolapse); Closed left hip fracture ([place]); Leukocytosis; Fracture, intertrochanteric, left femur ([place]); Faintness; Acute blood loss anemia; Thrombocytosis ([place]); Dehydration; Osteoporosis; History of breast cancer in female; and Foot swelling on her problem list.    ALLERGIES:  is allergic to sulfur.  MEDICATIONS: has a current medication list which includes the following prescription(s): amlodipine, anagrelide, aspirin, calcium carbonate-vitamin d, cetirizine hcl, vitamin d3, colestipol, fluticasone, hydrochlorothiazide, lisinopril, loperamide, metoprolol succinate, multivitamin, pantoprazole, potassium chloride, and probiotic product.  SURGICAL HISTORY:  Past Surgical History:  Procedure Laterality Date  . ABDOMINAL HYSTERECTOMY     ovaries taken  . APPENDECTOMY    . BREAST LUMPECTOMY     right  . CARDIOVASCULAR STRESS TEST  12/23/2000   EF 70%  . CATARACT EXTRACTION     bilateral  . ELBOW FRACTURE SURGERY     left  . FEMUR IM NAIL Left 05/08/2015   Procedure: INTERTROCHANTERIC FEMORAL NAIL;  Surgeon: Rod Can, MD;  Location: [facility];  Service: Orthopedics;  Laterality: Left;  . HEMATOMA EVACUATION     right  . TONSILLECTOMY    . TRANSTHORACIC ECHOCARDIOGRAM  03/05/2010   EF 55-60%   PROBLEM LIST:  1. Polycythemia vera diagnosed in March 2009. JAK2 mutation was detected on 04/21/2007. Hemoglobin in April 2009 was 18.4  with hematocrit of 55.0. Platelet count on July 12, 2007, was 868,000. The patient has never had a bone marrow exam. She had been undergoing phlebotomies in the past, approximately once a month, for hematocrit greater than or equal to 45%. The patient has been on  anagrelide 0.5 mg twice daily, as well as aspirin. She has not had any thrombotic complications. We will be carrying out a 1-unit phlebotomy whenever the hematocrit is greater than or equal to 42%. The patient does require normal saline 300 mL after phlebotomy because of symptomatic hypotension.  2. 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  Lumpectomy on 01/04/2006 followed by radiation treatments under the direction of Dr. Gery Pray. The patient has not required any systemic therapy. She does have yearly mammograms.  3. Mitral valve prolapse.  4. Hypertension.  5. Diverticulosis.  6. History of esophageal stricture.  7. History of gastritis.  8. History of pelvic fractures after falling on August 23, 2006.  REVIEW OF SYSTEMS:   Constitutional: Denies fevers, chills or abnormal weight loss Eyes: Denies blurriness of vision Ears, nose, mouth, throat, and face: Denies mucositis or sore throat Respiratory: Denies cough, dyspnea or wheezes Cardiovascular: Denies palpitation, chest discomfort or lower extremity swelling Gastrointestinal:  Denies nausea, heartburn or change in bowel habits Skin: Denies abnormal skin rashes Lymphatics: Denies new lymphadenopathy or easy bruising Neurological:Denies numbness, tingling or new weaknesses Behavioral/Psych: Mood is stable, no new changes  All other systems were reviewed with the patient and are negative.  PHYSICAL EXAMINATION: ECOG PERFORMANCE STATUS: 0  Blood pressure (!) 157/60, pulse 84, temperature 97.9 F (36.6 C), temperature source Oral, resp. rate 17, height 5\' 3"  (1.6 m), weight 94 lb 11.2 oz (43 kg), SpO2 96 %.  GENERAL:alert, no distress and comfortable; she has scoliosis and looks her stated age.  SKIN: skin color, texture, turgor are normal, no rashes but seborrheic keratosis.  EYES: normal, Conjunctiva are pink and non-injected, sclera clear OROPHARYNX:no exudate, no erythema and lips, buccal mucosa, and tongue  normal  NECK: supple, thyroid normal size, non-tender, without nodularity LYMPH:  no palpable lymphadenopathy in the cervical, axillary or supraclavicular LUNGS: clear to auscultation and percussion with normal breathing effort HEART: regular rate & rhythm and no murmurs and no lower extremity edema ABDOMEN:abdomen soft, non-tender and normal bowel sounds Musculoskeletal: No issues. NEURO: alert & oriented x 3 with fluent speech, no focal motor/sensory deficits Breasts: Breast inspection showed them to be symmetrical with no nipple discharge. Status post right lumpectomy change.  Palpation of the breasts and axilla revealed no obvious mass that I could appreciate.   LABORATORY DATA: CBC Latest Ref Rng & Units 05/05/2016 04/07/2016 03/24/2016  WBC 3.9 - 10.3 10e3/uL 16.1(H) 12.9(H) 13.3(H)  Hemoglobin 11.6 - 15.9 g/dL 14.3 14.0 14.3  Hematocrit 34.8 - 46.6 % 46.4 45.1 45.4  Platelets 145 - 400 10e3/uL 450(H) 291 342    CMP Latest Ref Rng & Units 03/24/2016 12/03/2015 08/13/2015  Glucose 70 - 140 mg/dl 101 83 95  BUN 7.0 - 26.0 mg/dL 28.4(H) 23.0 18.9  Creatinine 0.6 - 1.1 mg/dL 1.4(H) 1.2(H) 0.9  Sodium 136 - 145 mEq/L 133(L) 137 134(L)  Potassium 3.5 - 5.1 mEq/L 4.9 4.5 4.9  Chloride 101 - 111 mmol/L - - -  CO2 22 - 29 mEq/L 24 27 25   Calcium 8.4 - 10.4 mg/dL 9.2 9.6 9.9  Total Protein 6.4 - 8.3 g/dL 6.2(L) 6.8 6.6  Total Bilirubin 0.20 - 1.20 mg/dL 0.34 0.40 0.38  Alkaline Phos 40 - 150 U/L 80 85 123  AST 5 - 34 U/L 10 20 17   ALT 0 - 55 U/L 8 14 12      IMAGING STUDIES:  1. Ultrasound of the abdomen, limited, on 01/21/2006, showed a splenic volume of 73 cc with maximum ultrasound dimension of 8.8 cm. Spleen measured 8.8 x 4.1 x 3.9 cm, for a volume of 73 cc. No focal lesions were identified.  2. Diagnostic bilateral digital mammogram carried out at [facility] on 01/01/2011 showed no suspicious findings.  3. Mammogram, diagnostic, bilateral on 01/04/2012 showed no significant  abnormalities. There were post lumpectomy changes present involving the right breast.   ASSESSMENT: [name] 83 y.o. female with a history of Polycythemia vera ([place]) - Plan: anagrelide (AGRYLIN) 1 MG capsule, 0.9 %  sodium chloride infusion  Osteoporosis, unspecified osteoporosis type, unspecified pathological fracture presence - Plan: anagrelide (AGRYLIN) 1 MG capsule  History of breast cancer in female - Plan: anagrelide (AGRYLIN) 1 MG capsule  Bilateral swelling of feet and ankles - Plan: anagrelide (AGRYLIN) 1 MG capsule  PLAN:  1. Myeloproliferative neoplasm, Polycythemia vera, JAK2(+)  --The patient is doing well clinically. -HCT 46.4 TODAY, she will NEED phlebotomy today. -She previously did not tolerate Hydrea due to thrombocytopenia. -We previously discussed this is a incurable disease, most people have indolent and benign course, the major complication is thrombosis. Some patients may develop leukemia and myelofibrosis at the end. -Due to her thrombocytosis, I previously increased her anagrelide to 2 mg BID, which has controlled her thrombocytosis well overall  2. History of right breast DCIS  --Involving the right breast with biopsy on 11/29/2005.  Lumpectomy on 01/04/2006 followed by radiation treatments under the direction of Dr. Gery Pray.  -She is still on annual screening mammogram surveillance, will continue.   3. L2 compression fracture in 04/2014, left femoral fracture in April 2017, osteoporosis -Her pain and the morbility has improved much after rehab  -Continue supportive care and symptom management by her primary care physician -She is on Prolia with her PCP, I encourage her to continue calcium and vitamin D 2 tablets a day   Plan -Continue Anagrelide 2 mg BID. I refilled today. -phlebotomy today. -Lab and phlebotomy every 4 weeks X4. Phlebotomy 3/4 unit blood if HCT>45%, with normal saline 400 mL after phlebotomy. -f/u in 4 months.  All questions  were answered. The patient knows to call the clinic with any problems, questions or concerns. We can certainly see the patient much sooner if necessary.  I spent 15 minutes counseling the patient face to face. The total time spent in the appointment was 20 minutes.   Truitt Merle  05/05/2016   This document serves as a record of services personally performed by Truitt Merle, MD. It was created on her behalf by Darcus Austin, a trained medical scribe. The creation of this record is based on the scribe's personal observations and the provider's statements to them. This document has been checked and approved by the attending provider.

## 2016-04-28 ENCOUNTER — Encounter: Payer: Self-pay | Admitting: Cardiovascular Disease

## 2016-04-28 ENCOUNTER — Ambulatory Visit (INDEPENDENT_AMBULATORY_CARE_PROVIDER_SITE_OTHER): Payer: Medicare Other | Admitting: Cardiovascular Disease

## 2016-04-28 VITALS — BP 154/86 | HR 91 | Ht 63.0 in | Wt 95.1 lb

## 2016-04-28 DIAGNOSIS — I10 Essential (primary) hypertension: Secondary | ICD-10-CM | POA: Diagnosis not present

## 2016-04-28 DIAGNOSIS — M7989 Other specified soft tissue disorders: Secondary | ICD-10-CM

## 2016-04-28 MED ORDER — METOPROLOL SUCCINATE ER 50 MG PO TB24: 50.0000 mg | ORAL_TABLET | Freq: Every day | ORAL | 3 refills | Status: DC

## 2016-04-28 NOTE — Patient Instructions (Signed)
Medication Instructions:  RESTART Toprol XL 50 mg    Labwork: None Ordered   Testing/Procedures: Your physician has requested that you have a lower extremity venous duplex. This test is an ultrasound of the veins in the legs or arms. It looks at venous blood flow that carries blood from the heart to the legs or arms. Allow one hour for a Lower Venous exam. Allow thirty minutes for an Upper Venous exam. There are no restrictions or special instructions.   Follow-Up: Your physician wants you to follow-up in: 3-4 months with Dr. Acie Fredrickson.  You will receive a reminder letter in the mail two months in advance. If you don't receive a letter, please call our office to schedule the follow-up appointment.   If you need a refill on your cardiac medications before your next appointment, please call your pharmacy.   Thank you for choosing [facility]! Christen Bame, RN [phone]

## 2016-04-28 NOTE — Progress Notes (Signed)
[name] Date of Birth  03-31-1926 [place] [address]    [address] [place], [place]  [address]    [place],   [address] [phone]  Fax  [phone]  [phone]  Fax [phone]   History of Present Illness:  Problem list: 1. Mitral valve prolapse 2. Breast cancer 3. Polycythemia 4. Hypertension  [name] is an 83 year old female with a history of mitral valve prolapse, hypertension, and polycythemia. She has phlebotomy  about once a month for her polycythemia. Her blood pressure typically is little bit elevated and then goes down quickly after her phlebotomy. She's feeling quite well. She's exercising regularly.  July 05, 2012:  No CP.  She has mild  Dyspnea.  She is still getting phlebotomy regularly.  She has some mild dyspnea when she first lies down but it typically resolves and she doe not have to sleep on any pillows.  She is able to do her normal activities without problems.   07/09/2013:  [name] is doing ok.  Not walking as much as she used to.  Stays active. h Has lots of cramps in her feet.   Dec. 7 ,2015:  [name] is an 83 yo who we follow for HTN and MVP.  She also has polycythemia and gets phlebotomy once a month.  Staying moderately active.   Gets fatigued easy She had labs a month ago  Dec. 6, 2016:   [name] is doing ok from a cardiac standpoint . Broke her back this past march .  No CP ,  Occasional dyspnea.  Has polycythemia vera.  Had a phlebotomy last week .   July 07, 2015:  Has had a hip fracture in April.  Has had it repaired.  Still walking with a walker  April 28, 2016:  Doing ok Has focal swelling of the right foot .  Has stopped her metoprolol  BP has been high. Has been eating lots of soup   Current Outpatient Prescriptions on File Prior to Visit  Medication Sig Dispense Refill  . amLODipine (NORVASC) 2.5 MG tablet Take 1.25 mg by mouth daily.    Marland Kitchen anagrelide  (AGRYLIN) 1 MG capsule Take 2 capsules (2 mg total) by mouth 2 (two) times daily. 120 capsule 2  . aspirin 81 MG tablet Take 81 mg by mouth daily.      . Calcium Carbonate-Vitamin D (CALCIUM 500/D PO) Take 1 capsule by mouth daily.    . Cetirizine HCl (ZYRTEC PO) Take 1 tablet by mouth as needed (AS NEEDED FOR ALLERGIES).     . Cholecalciferol (VITAMIN D3) 1000 UNITS CAPS Take 2,000 mg by mouth 2 (two) times daily.     . colestipol (COLESTID) 1 g tablet Take 1-2 tablets (1-2 g total) by mouth daily. Reported on 05/14/2015 60 tablet 12  . fluticasone (FLONASE) 50 MCG/ACT nasal spray Place 2 sprays into the nose as needed for allergies or rhinitis. Reported on 06/11/2015    . hydrochlorothiazide (MICROZIDE) 12.5 MG capsule TAKE (1) CAPSULE DAILY. 90 capsule 0  . lisinopril (PRINIVIL,ZESTRIL) 20 MG tablet Take 20 mg by mouth daily.    Marland Kitchen loperamide (IMODIUM) 2 MG capsule Take 2 mg by mouth as needed for diarrhea or loose stools.    . Multiple Vitamin (MULTIVITAMIN) tablet Take 1 tablet by mouth daily.      . pantoprazole (PROTONIX) 40 MG tablet Take 40 mg by mouth as  needed (AS NEEDED FOR STOMACH).     Marland Kitchen potassium chloride (K-DUR) 10 MEQ tablet TAKE 1 TABLET ONCE DAILY. 90 tablet 0  . Probiotic Product (ULTRAFLORA IMMUNE HEALTH PO) Take 1 tablet by mouth daily. Reported on 07/01/2015     No current facility-administered medications on file prior to visit.     Allergies  Allergen Reactions  . Sulfur Other (See Comments)    Pt doesn't remember reaction.    Past Medical History:  Diagnosis Date  . Acute blood loss anemia   . Arthritis   . Atrophic gastritis   . Breast cancer ([place])    right  . Candida esophagitis ([place])   . Chest tightness   . Chronic gastritis   . Closed intertrochanteric fracture of left femur with routine healing   . Diverticulosis   . Hypertension   . IBS (irritable bowel syndrome)   . MVP (mitral valve prolapse)   . Palpitation   . Polycythemia   . PVC's (premature  ventricular contractions)   . Rhinitis   . Unsteady gait     Past Surgical History:  Procedure Laterality Date  . ABDOMINAL HYSTERECTOMY     ovaries taken  . APPENDECTOMY    . BREAST LUMPECTOMY     right  . CARDIOVASCULAR STRESS TEST  12/23/2000   EF 70%  . CATARACT EXTRACTION     bilateral  . ELBOW FRACTURE SURGERY     left  . FEMUR IM NAIL Left 05/08/2015   Procedure: INTERTROCHANTERIC FEMORAL NAIL;  Surgeon: Rod Can, MD;  Location: [facility];  Service: Orthopedics;  Laterality: Left;  . HEMATOMA EVACUATION     right  . TONSILLECTOMY    . TRANSTHORACIC ECHOCARDIOGRAM  03/05/2010   EF 55-60%    History  Smoking Status  . Former Smoker  . Quit date: 08/24/1980  Smokeless Tobacco  . Never Used    History  Alcohol Use  . Yes    Comment: occasional    Family History  Problem Relation Age of Onset  . Stomach cancer Mother   . Hypertension Father   . Stroke Father   . Prostate cancer Father   . Hypertension Sister   . Hypertension Brother   . Prostate cancer Brother   . Diabetes Brother   . Colon cancer Neg Hx     Reviw of Systems:  Reviewed in the HPI.  All other systems are negative.  Physical Exam: BP (!) 154/86 (BP Location: Left Arm, Patient Position: Sitting, Cuff Size: Normal)   Pulse 91   Ht 5\' 3"  (1.6 m)   Wt 95 lb 1.9 oz (43.1 kg)   SpO2 95%   BMI 16.85 kg/m  The patient is alert and oriented x 3.  The mood and affect are normal.   Skin: warm and dry.  Color is normal.   HEENT:   Neck is supple. His membranes are moist. The carotids are normal. No JVD. Lungs: Lungs are clear.  Heart: Regular rate S1-S2.  She is a soft systolic murmur. Abdomen: She is thin. She has good bowel sounds. There are no bruits  Extremities:  No clubbing cyanosis or edema Neuro:  Nonfocal.    ECG: April 28, 2016:   NSR at 91.     Assessment / Plan:   1. Mitral valve prolapse- stable 2. Right foot swelling: She has local swelling of her right lower leg and  foot. We'll get a venous duplex scan of her lower extremities. for further evaluation  of this. 3. Polycythemia 4. Hypertension -  It has been elevated recently. She has been eating lots of soup. I advised her to eat salads or sandwiches instead of soup. We'll restart Toprol-XL 50 mg a day.   I'll see her again in 3-4 months.  5. Left hip fracture :   Walking with a cane     Mertie Moores, MD  04/28/2016 4:26 PM    [address],  [place], [place]  [address] Pager [phone] Phone: [phone]; Fax: [phone]   [facility]  [address] [place], [place]  [address] [phone]   Fax [phone]

## 2016-05-05 ENCOUNTER — Ambulatory Visit (HOSPITAL_BASED_OUTPATIENT_CLINIC_OR_DEPARTMENT_OTHER): Payer: Medicare Other

## 2016-05-05 ENCOUNTER — Telehealth: Payer: Self-pay | Admitting: Hematology

## 2016-05-05 ENCOUNTER — Encounter: Payer: Self-pay | Admitting: Hematology

## 2016-05-05 ENCOUNTER — Other Ambulatory Visit (HOSPITAL_BASED_OUTPATIENT_CLINIC_OR_DEPARTMENT_OTHER): Payer: Medicare Other

## 2016-05-05 ENCOUNTER — Ambulatory Visit (HOSPITAL_BASED_OUTPATIENT_CLINIC_OR_DEPARTMENT_OTHER): Payer: Medicare Other | Admitting: Hematology

## 2016-05-05 VITALS — BP 157/60 | HR 84 | Temp 97.9°F | Resp 17 | Ht 63.0 in | Wt 94.7 lb

## 2016-05-05 DIAGNOSIS — D45 Polycythemia vera: Secondary | ICD-10-CM

## 2016-05-05 DIAGNOSIS — D751 Secondary polycythemia: Secondary | ICD-10-CM

## 2016-05-05 DIAGNOSIS — M25476 Effusion, unspecified foot: Secondary | ICD-10-CM

## 2016-05-05 DIAGNOSIS — D500 Iron deficiency anemia secondary to blood loss (chronic): Secondary | ICD-10-CM

## 2016-05-05 DIAGNOSIS — M25474 Effusion, right foot: Secondary | ICD-10-CM

## 2016-05-05 DIAGNOSIS — Z86000 Personal history of in-situ neoplasm of breast: Secondary | ICD-10-CM | POA: Diagnosis not present

## 2016-05-05 DIAGNOSIS — Z853 Personal history of malignant neoplasm of breast: Secondary | ICD-10-CM

## 2016-05-05 DIAGNOSIS — M25473 Effusion, unspecified ankle: Secondary | ICD-10-CM

## 2016-05-05 DIAGNOSIS — M810 Age-related osteoporosis without current pathological fracture: Secondary | ICD-10-CM

## 2016-05-05 LAB — CBC WITH DIFFERENTIAL/PLATELET
BASO%: 0.3 % (ref 0.0–2.0)
Basophils Absolute: 0 10*3/uL (ref 0.0–0.1)
EOS%: 2.3 % (ref 0.0–7.0)
Eosinophils Absolute: 0.4 10*3/uL (ref 0.0–0.5)
HCT: 46.4 % (ref 34.8–46.6)
HGB: 14.3 g/dL (ref 11.6–15.9)
LYMPH%: 4.3 % — AB (ref 14.0–49.7)
MCH: 20.4 pg — ABNORMAL LOW (ref 25.1–34.0)
MCHC: 30.8 g/dL — AB (ref 31.5–36.0)
MCV: 66.3 fL — ABNORMAL LOW (ref 79.5–101.0)
MONO#: 0.7 10*3/uL (ref 0.1–0.9)
MONO%: 4.6 % (ref 0.0–14.0)
NEUT#: 14.3 10*3/uL — ABNORMAL HIGH (ref 1.5–6.5)
NEUT%: 88.5 % — AB (ref 38.4–76.8)
PLATELETS: 450 10*3/uL — AB (ref 145–400)
RBC: 7 10*6/uL — AB (ref 3.70–5.45)
RDW: 22.5 % — ABNORMAL HIGH (ref 11.2–14.5)
WBC: 16.1 10*3/uL — AB (ref 3.9–10.3)
lymph#: 0.7 10*3/uL — ABNORMAL LOW (ref 0.9–3.3)

## 2016-05-05 LAB — FERRITIN: FERRITIN: 5 ng/mL — AB (ref 9–269)

## 2016-05-05 MED ORDER — SODIUM CHLORIDE 0.9 % IV SOLN
INTRAVENOUS | Status: DC
  Administered 2016-05-05: 11:00:00 via INTRAVENOUS

## 2016-05-05 MED ORDER — ANAGRELIDE HCL 1 MG PO CAPS: 2.0000 mg | ORAL_CAPSULE | Freq: Two times a day (BID) | ORAL | 4 refills | Status: DC

## 2016-05-05 NOTE — Telephone Encounter (Signed)
Appointments scheduled per 4.3.18 LOS. Patient given AVS report and calendars with future scheduled appointments.  °

## 2016-05-05 NOTE — Progress Notes (Signed)
400 mL removed through a 20 gauge IV set over 10 minutes. Normal saline infusing over 1 hour. Patient tolerated well. Nourishment provided.

## 2016-05-05 NOTE — Patient Instructions (Signed)
Therapeutic Phlebotomy Therapeutic phlebotomy is the controlled removal of blood from a person's body for the purpose of treating a medical condition. The procedure is similar to donating blood. Usually, about a pint (470 mL, or 0.47L) of blood is removed. The average adult has 9-12 pints (4.3-5.7 L) of blood. Therapeutic phlebotomy may be used to treat the following medical conditions:  Hemochromatosis. This is a condition in which the blood contains too much iron.  Polycythemia vera. This is a condition in which the blood contains too many red blood cells.  Porphyria cutanea tarda. This is a disease in which an important part of hemoglobin is not made properly. It results in the buildup of abnormal amounts of porphyrins in the body.  Sickle cell disease. This is a condition in which the red blood cells form an abnormal crescent shape rather than a round shape. Tell a health care provider about:  Any allergies you have.  All medicines you are taking, including vitamins, herbs, eye drops, creams, and over-the-counter medicines.  Any problems you or family members have had with anesthetic medicines.  Any blood disorders you have.  Any surgeries you have had.  Any medical conditions you have. What are the risks? Generally, this is a safe procedure. However, problems may occur, including:  Nausea or light-headedness.  Low blood pressure.  Soreness, bleeding, swelling, or bruising at the needle insertion site.  Infection. What happens before the procedure?  Follow instructions from your health care provider about eating or drinking restrictions.  Ask your health care provider about changing or stopping your regular medicines. This is especially important if you are taking diabetes medicines or blood thinners.  Wear clothing with sleeves that can be raised above the elbow.  Plan to have someone take you home after the procedure.  You may have a blood sample taken. What happens  during the procedure?  A needle will be inserted into one of your veins.  Tubing and a collection bag will be attached to that needle.  Blood will flow through the needle and tubing into the collection bag.  You may be asked to open and close your hand slowly and continually during the entire collection.  After the specified amount of blood has been removed from your body, the collection bag and tubing will be clamped.  The needle will be removed from your vein.  Pressure will be held on the site of the needle insertion to stop the bleeding.  A bandage (dressing) will be placed over the needle insertion site. The procedure may vary among health care providers and hospitals. What happens after the procedure?  Your recovery will be assessed and monitored.  You can return to your normal activities as directed by your health care provider. This information is not intended to replace advice given to you by your health care provider. Make sure you discuss any questions you have with your health care provider. Document Released: 06/22/2010 Document Revised: 09/20/2015 Document Reviewed: 01/14/2014 Elsevier Interactive Patient Education  2017 [employer]  

## 2016-05-11 ENCOUNTER — Ambulatory Visit (HOSPITAL_COMMUNITY)
Admission: RE | Admit: 2016-05-11 | Discharge: 2016-05-11 | Disposition: A | Discharge status: Home / Self Care | Payer: Medicare Other | Source: Ambulatory Visit | Attending: Cardiovascular Disease | Admitting: Cardiovascular Disease

## 2016-05-11 DIAGNOSIS — M7989 Other specified soft tissue disorders: Secondary | ICD-10-CM | POA: Insufficient documentation

## 2016-06-02 ENCOUNTER — Other Ambulatory Visit (HOSPITAL_BASED_OUTPATIENT_CLINIC_OR_DEPARTMENT_OTHER): Payer: Medicare Other

## 2016-06-02 ENCOUNTER — Ambulatory Visit: Payer: Medicare Other

## 2016-06-02 ENCOUNTER — Encounter (INDEPENDENT_AMBULATORY_CARE_PROVIDER_SITE_OTHER): Payer: Self-pay

## 2016-06-02 DIAGNOSIS — D45 Polycythemia vera: Secondary | ICD-10-CM | POA: Diagnosis present

## 2016-06-02 DIAGNOSIS — D500 Iron deficiency anemia secondary to blood loss (chronic): Secondary | ICD-10-CM

## 2016-06-02 LAB — CBC WITH DIFFERENTIAL/PLATELET
BASO%: 0.5 % (ref 0.0–2.0)
BASOS ABS: 0.1 10*3/uL (ref 0.0–0.1)
EOS ABS: 0.4 10*3/uL (ref 0.0–0.5)
EOS%: 2.9 % (ref 0.0–7.0)
HCT: 41.7 % (ref 34.8–46.6)
HGB: 12.8 g/dL (ref 11.6–15.9)
LYMPH%: 6 % — ABNORMAL LOW (ref 14.0–49.7)
MCH: 19.6 pg — ABNORMAL LOW (ref 25.1–34.0)
MCHC: 30.8 g/dL — ABNORMAL LOW (ref 31.5–36.0)
MCV: 63.6 fL — ABNORMAL LOW (ref 79.5–101.0)
MONO#: 0.6 10*3/uL (ref 0.1–0.9)
MONO%: 4.9 % (ref 0.0–14.0)
NEUT%: 85.7 % — ABNORMAL HIGH (ref 38.4–76.8)
NEUTROS ABS: 11.2 10*3/uL — AB (ref 1.5–6.5)
Platelets: 395 10*3/uL (ref 145–400)
RBC: 6.56 10*6/uL — AB (ref 3.70–5.45)
RDW: 21.5 % — AB (ref 11.2–14.5)
WBC: 13 10*3/uL — AB (ref 3.9–10.3)
lymph#: 0.8 10*3/uL — ABNORMAL LOW (ref 0.9–3.3)

## 2016-06-02 LAB — FERRITIN: Ferritin: 6 ng/ml — ABNORMAL LOW (ref 9–269)

## 2016-06-02 NOTE — Progress Notes (Signed)
HCT 41.6 today (<45). No phlebotomy needed. Lab results printed and taken to patient in lobby. Assessed patient and she stated she "feels good". No complaints or concerns. Discharge instructions provided. Patient voiced understanding. Will call with any questions or concerns.   Wylene Simmer, BSN, RN 06/02/2016 10:36 AM

## 2016-06-21 ENCOUNTER — Other Ambulatory Visit: Payer: Self-pay | Admitting: Cardiovascular Disease

## 2016-06-30 ENCOUNTER — Ambulatory Visit (HOSPITAL_BASED_OUTPATIENT_CLINIC_OR_DEPARTMENT_OTHER): Payer: Medicare Other

## 2016-06-30 ENCOUNTER — Other Ambulatory Visit (HOSPITAL_BASED_OUTPATIENT_CLINIC_OR_DEPARTMENT_OTHER): Payer: Medicare Other

## 2016-06-30 VITALS — BP 188/69 | HR 71 | Temp 97.5°F | Resp 18

## 2016-06-30 DIAGNOSIS — D45 Polycythemia vera: Secondary | ICD-10-CM

## 2016-06-30 DIAGNOSIS — D751 Secondary polycythemia: Secondary | ICD-10-CM

## 2016-06-30 DIAGNOSIS — D500 Iron deficiency anemia secondary to blood loss (chronic): Secondary | ICD-10-CM

## 2016-06-30 LAB — COMPREHENSIVE METABOLIC PANEL
ALT: 12 U/L (ref 0–55)
AST: 16 U/L (ref 5–34)
Albumin: 3.9 g/dL (ref 3.5–5.0)
Alkaline Phosphatase: 68 U/L (ref 40–150)
Anion Gap: 8 mEq/L (ref 3–11)
BILIRUBIN TOTAL: 0.54 mg/dL (ref 0.20–1.20)
BUN: 31.9 mg/dL — AB (ref 7.0–26.0)
CO2: 24 meq/L (ref 22–29)
Calcium: 9.6 mg/dL (ref 8.4–10.4)
Chloride: 106 mEq/L (ref 98–109)
Creatinine: 1.3 mg/dL — ABNORMAL HIGH (ref 0.6–1.1)
EGFR: 36 mL/min/{1.73_m2} — AB (ref >90)
GLUCOSE: 125 mg/dL (ref 70–140)
POTASSIUM: 4.8 meq/L (ref 3.5–5.1)
SODIUM: 139 meq/L (ref 136–145)
TOTAL PROTEIN: 6.5 g/dL (ref 6.4–8.3)

## 2016-06-30 LAB — CBC WITH DIFFERENTIAL/PLATELET
BASO%: 0.3 % (ref 0.0–2.0)
BASOS ABS: 0 10*3/uL (ref 0.0–0.1)
EOS ABS: 0.3 10*3/uL (ref 0.0–0.5)
EOS%: 2.3 % (ref 0.0–7.0)
HCT: 44.1 % (ref 34.8–46.6)
HEMOGLOBIN: 13.1 g/dL (ref 11.6–15.9)
LYMPH#: 0.7 10*3/uL — AB (ref 0.9–3.3)
LYMPH%: 6.1 % — ABNORMAL LOW (ref 14.0–49.7)
MCH: 19.6 pg — ABNORMAL LOW (ref 25.1–34.0)
MCHC: 29.7 g/dL — ABNORMAL LOW (ref 31.5–36.0)
MCV: 66 fL — AB (ref 79.5–101.0)
MONO#: 0.6 10*3/uL (ref 0.1–0.9)
MONO%: 5.7 % (ref 0.0–14.0)
NEUT#: 9.1 10*3/uL — ABNORMAL HIGH (ref 1.5–6.5)
NEUT%: 85.6 % — ABNORMAL HIGH (ref 38.4–76.8)
NRBC: 0 % (ref 0–0)
PLATELETS: 300 10*3/uL (ref 145–400)
RBC: 6.68 10*6/uL — ABNORMAL HIGH (ref 3.70–5.45)
RDW: 22.4 % — AB (ref 11.2–14.5)
WBC: 10.7 10*3/uL — ABNORMAL HIGH (ref 3.9–10.3)

## 2016-06-30 LAB — FERRITIN: Ferritin: 7 ng/ml — ABNORMAL LOW (ref 9–269)

## 2016-06-30 LAB — TECHNOLOGIST REVIEW

## 2016-06-30 MED ORDER — SODIUM CHLORIDE 0.9 % IV SOLN: Freq: Once | INTRAVENOUS | Status: DC

## 2016-06-30 NOTE — Progress Notes (Signed)
Pt HCT 44.1. No Phlebotomy, pt given copy of lab results and f/u appts

## 2016-06-30 NOTE — Patient Instructions (Signed)
Therapeutic Phlebotomy Therapeutic phlebotomy is the controlled removal of blood from a person's body for the purpose of treating a medical condition. The procedure is similar to donating blood. Usually, about a pint (470 mL, or 0.47L) of blood is removed. The average adult has 9-12 pints (4.3-5.7 L) of blood. Therapeutic phlebotomy may be used to treat the following medical conditions:  Hemochromatosis. This is a condition in which the blood contains too much iron.  Polycythemia vera. This is a condition in which the blood contains too many red blood cells.  Porphyria cutanea tarda. This is a disease in which an important part of hemoglobin is not made properly. It results in the buildup of abnormal amounts of porphyrins in the body.  Sickle cell disease. This is a condition in which the red blood cells form an abnormal crescent shape rather than a round shape. Tell a health care provider about:  Any allergies you have.  All medicines you are taking, including vitamins, herbs, eye drops, creams, and over-the-counter medicines.  Any problems you or family members have had with anesthetic medicines.  Any blood disorders you have.  Any surgeries you have had.  Any medical conditions you have. What are the risks? Generally, this is a safe procedure. However, problems may occur, including:  Nausea or light-headedness.  Low blood pressure.  Soreness, bleeding, swelling, or bruising at the needle insertion site.  Infection. What happens before the procedure?  Follow instructions from your health care provider about eating or drinking restrictions.  Ask your health care provider about changing or stopping your regular medicines. This is especially important if you are taking diabetes medicines or blood thinners.  Wear clothing with sleeves that can be raised above the elbow.  Plan to have someone take you home after the procedure.  You may have a blood sample taken. What happens  during the procedure?  A needle will be inserted into one of your veins.  Tubing and a collection bag will be attached to that needle.  Blood will flow through the needle and tubing into the collection bag.  You may be asked to open and close your hand slowly and continually during the entire collection.  After the specified amount of blood has been removed from your body, the collection bag and tubing will be clamped.  The needle will be removed from your vein.  Pressure will be held on the site of the needle insertion to stop the bleeding.  A bandage (dressing) will be placed over the needle insertion site. The procedure may vary among health care providers and hospitals. What happens after the procedure?  Your recovery will be assessed and monitored.  You can return to your normal activities as directed by your health care provider. This information is not intended to replace advice given to you by your health care provider. Make sure you discuss any questions you have with your health care provider. Document Released: 06/22/2010 Document Revised: 09/20/2015 Document Reviewed: 01/14/2014 Elsevier Interactive Patient Education  2017 [employer]  

## 2016-07-19 ENCOUNTER — Other Ambulatory Visit: Payer: Self-pay | Admitting: Interventional Cardiology

## 2016-07-24 ENCOUNTER — Inpatient Hospital Stay (HOSPITAL_COMMUNITY)
Admission: EM | Admit: 2016-07-24 | Discharge: 2016-07-29 | DRG: 097 | Disposition: A | Discharge status: Skilled Nursing Facility | Payer: Medicare Other | Attending: Nephrology | Admitting: Nephrology

## 2016-07-24 ENCOUNTER — Emergency Department (HOSPITAL_COMMUNITY): Payer: Medicare Other

## 2016-07-24 ENCOUNTER — Encounter (HOSPITAL_COMMUNITY): Payer: Self-pay | Admitting: Emergency Medicine

## 2016-07-24 DIAGNOSIS — I776 Arteritis, unspecified: Secondary | ICD-10-CM | POA: Diagnosis present

## 2016-07-24 DIAGNOSIS — E43 Unspecified severe protein-calorie malnutrition: Secondary | ICD-10-CM | POA: Diagnosis not present

## 2016-07-24 DIAGNOSIS — I341 Nonrheumatic mitral (valve) prolapse: Secondary | ICD-10-CM | POA: Diagnosis present

## 2016-07-24 DIAGNOSIS — J310 Chronic rhinitis: Secondary | ICD-10-CM | POA: Diagnosis present

## 2016-07-24 DIAGNOSIS — I160 Hypertensive urgency: Secondary | ICD-10-CM | POA: Diagnosis present

## 2016-07-24 DIAGNOSIS — D473 Essential (hemorrhagic) thrombocythemia: Secondary | ICD-10-CM | POA: Diagnosis present

## 2016-07-24 DIAGNOSIS — M1990 Unspecified osteoarthritis, unspecified site: Secondary | ICD-10-CM | POA: Diagnosis present

## 2016-07-24 DIAGNOSIS — G0490 Encephalitis and encephalomyelitis, unspecified: Secondary | ICD-10-CM | POA: Diagnosis not present

## 2016-07-24 DIAGNOSIS — Z681 Body mass index (BMI) 19 or less, adult: Secondary | ICD-10-CM | POA: Diagnosis not present

## 2016-07-24 DIAGNOSIS — R109 Unspecified abdominal pain: Secondary | ICD-10-CM | POA: Diagnosis present

## 2016-07-24 DIAGNOSIS — R509 Fever, unspecified: Secondary | ICD-10-CM | POA: Diagnosis present

## 2016-07-24 DIAGNOSIS — Z79899 Other long term (current) drug therapy: Secondary | ICD-10-CM

## 2016-07-24 DIAGNOSIS — Z87891 Personal history of nicotine dependence: Secondary | ICD-10-CM

## 2016-07-24 DIAGNOSIS — R197 Diarrhea, unspecified: Secondary | ICD-10-CM | POA: Diagnosis present

## 2016-07-24 DIAGNOSIS — R410 Disorientation, unspecified: Secondary | ICD-10-CM | POA: Diagnosis not present

## 2016-07-24 DIAGNOSIS — G252 Other specified forms of tremor: Secondary | ICD-10-CM | POA: Diagnosis present

## 2016-07-24 DIAGNOSIS — R2681 Unsteadiness on feet: Secondary | ICD-10-CM | POA: Diagnosis present

## 2016-07-24 DIAGNOSIS — Z7901 Long term (current) use of anticoagulants: Secondary | ICD-10-CM

## 2016-07-24 DIAGNOSIS — I10 Essential (primary) hypertension: Secondary | ICD-10-CM | POA: Diagnosis not present

## 2016-07-24 DIAGNOSIS — D329 Benign neoplasm of meninges, unspecified: Secondary | ICD-10-CM | POA: Diagnosis present

## 2016-07-24 DIAGNOSIS — E871 Hypo-osmolality and hyponatremia: Secondary | ICD-10-CM | POA: Diagnosis present

## 2016-07-24 DIAGNOSIS — I674 Hypertensive encephalopathy: Secondary | ICD-10-CM | POA: Diagnosis not present

## 2016-07-24 DIAGNOSIS — Z7982 Long term (current) use of aspirin: Secondary | ICD-10-CM

## 2016-07-24 DIAGNOSIS — D72829 Elevated white blood cell count, unspecified: Secondary | ICD-10-CM | POA: Diagnosis present

## 2016-07-24 DIAGNOSIS — R946 Abnormal results of thyroid function studies: Secondary | ICD-10-CM | POA: Diagnosis present

## 2016-07-24 DIAGNOSIS — R569 Unspecified convulsions: Secondary | ICD-10-CM

## 2016-07-24 DIAGNOSIS — D75839 Thrombocytosis, unspecified: Secondary | ICD-10-CM | POA: Diagnosis present

## 2016-07-24 DIAGNOSIS — K589 Irritable bowel syndrome without diarrhea: Secondary | ICD-10-CM | POA: Diagnosis present

## 2016-07-24 DIAGNOSIS — Z8719 Personal history of other diseases of the digestive system: Secondary | ICD-10-CM

## 2016-07-24 DIAGNOSIS — G9340 Encephalopathy, unspecified: Secondary | ICD-10-CM | POA: Diagnosis present

## 2016-07-24 DIAGNOSIS — D45 Polycythemia vera: Secondary | ICD-10-CM | POA: Diagnosis present

## 2016-07-24 DIAGNOSIS — R7989 Other specified abnormal findings of blood chemistry: Secondary | ICD-10-CM | POA: Diagnosis present

## 2016-07-24 DIAGNOSIS — G40909 Epilepsy, unspecified, not intractable, without status epilepticus: Secondary | ICD-10-CM | POA: Diagnosis not present

## 2016-07-24 DIAGNOSIS — Z8679 Personal history of other diseases of the circulatory system: Secondary | ICD-10-CM

## 2016-07-24 DIAGNOSIS — R4701 Aphasia: Secondary | ICD-10-CM | POA: Diagnosis not present

## 2016-07-24 DIAGNOSIS — E876 Hypokalemia: Secondary | ICD-10-CM | POA: Diagnosis not present

## 2016-07-24 DIAGNOSIS — Z882 Allergy status to sulfonamides status: Secondary | ICD-10-CM

## 2016-07-24 LAB — CBC
HCT: 45.1 % (ref 36.0–46.0)
HEMOGLOBIN: 14.1 g/dL (ref 12.0–15.0)
MCH: 19.8 pg — ABNORMAL LOW (ref 26.0–34.0)
MCHC: 31.3 g/dL (ref 30.0–36.0)
MCV: 63.3 fL — ABNORMAL LOW (ref 78.0–100.0)
PLATELETS: 303 10*3/uL (ref 150–400)
RBC: 7.12 MIL/uL — AB (ref 3.87–5.11)
RDW: 20.3 % — ABNORMAL HIGH (ref 11.5–15.5)
WBC: 12.5 10*3/uL — AB (ref 4.0–10.5)

## 2016-07-24 LAB — COMPREHENSIVE METABOLIC PANEL
ALT: 10 U/L — AB (ref 14–54)
ANION GAP: 6 (ref 5–15)
AST: 17 U/L (ref 15–41)
Albumin: 4 g/dL (ref 3.5–5.0)
Alkaline Phosphatase: 71 U/L (ref 38–126)
BUN: 26 mg/dL — ABNORMAL HIGH (ref 6–20)
CALCIUM: 9.3 mg/dL (ref 8.9–10.3)
CO2: 24 mmol/L (ref 22–32)
CREATININE: 1.09 mg/dL — AB (ref 0.44–1.00)
Chloride: 107 mmol/L (ref 101–111)
GFR, EST AFRICAN AMERICAN: 51 mL/min — AB (ref >60)
GFR, EST NON AFRICAN AMERICAN: 44 mL/min — AB (ref >60)
Glucose, Bld: 123 mg/dL — ABNORMAL HIGH (ref 65–99)
Potassium: 4.6 mmol/L (ref 3.5–5.1)
Sodium: 137 mmol/L (ref 135–145)
Total Bilirubin: 0.5 mg/dL (ref 0.3–1.2)
Total Protein: 6.7 g/dL (ref 6.5–8.1)

## 2016-07-24 LAB — URINALYSIS, ROUTINE W REFLEX MICROSCOPIC
Bilirubin Urine: NEGATIVE
Glucose, UA: NEGATIVE mg/dL
Hgb urine dipstick: NEGATIVE
KETONES UR: 5 mg/dL — AB
LEUKOCYTES UA: NEGATIVE
NITRITE: NEGATIVE
PROTEIN: NEGATIVE mg/dL
Specific Gravity, Urine: 1.012 (ref 1.005–1.030)
pH: 6 (ref 5.0–8.0)

## 2016-07-24 LAB — CBG MONITORING, ED: GLUCOSE-CAPILLARY: 110 mg/dL — AB (ref 65–99)

## 2016-07-24 MED ORDER — LABETALOL HCL 5 MG/ML IV SOLN
10.0000 mg | Freq: Once | INTRAVENOUS | Status: AC
  Administered 2016-07-24: 10 mg via INTRAVENOUS
  Filled 2016-07-24: qty 4

## 2016-07-24 MED ORDER — LABETALOL HCL 5 MG/ML IV SOLN
10.0000 mg | Freq: Once | INTRAVENOUS | Status: AC
  Administered 2016-07-25: 10 mg via INTRAVENOUS
  Filled 2016-07-24: qty 4

## 2016-07-24 NOTE — ED Triage Notes (Addendum)
Pt comes in with complaints of difficulty speaking first noticed around 1830 this evening by family member. Family member states pt lives by herself and she is unsure when she became confused Hypertensive during triage but family member states this is normal for her. Hx of polycythemia. Pt alert to self, place, but some confusion with time, but she was able to correct herself.  Ambulatory to triage.  Denies pain.

## 2016-07-24 NOTE — ED Provider Notes (Signed)
[facility] DEPT Provider Note   CSN: [id number] Arrival date & time: 07/24/16  2008     History   Chief Complaint Chief Complaint  Patient presents with  . Hypertension  . Altered Mental Status    HPI [name] is a 83 y.o. female.  HPI Patient brought in by her daughter. Patient lives by herself. Spoke on the phone with her daughter at 6:30. At that time, daughter noted patient was having trouble word finding and seemed confused. Patient states she had a right-sided posterior headache earlier today but denies any current headaches. Denies any visual changes. No nausea or vomiting. Denies any focal weakness or numbness. Patient has been ambulating without any difficulty. Denies any fever or chills. No shortness of breath, cough or chest pain. She does have ongoing loose stools but denies blood in stool. No urinary symptoms. Past Medical History:  Diagnosis Date  . Acute blood loss anemia   . Arthritis   . Atrophic gastritis   . Breast cancer ([place])    right  . Candida esophagitis ([place])   . Chest tightness   . Chronic gastritis   . Closed intertrochanteric fracture of left femur with routine healing   . Diverticulosis   . Hypertension   . IBS (irritable bowel syndrome)   . MVP (mitral valve prolapse)   . Palpitation   . Polycythemia   . PVC's (premature ventricular contractions)   . Rhinitis   . Unsteady gait     Patient Active Problem List   Diagnosis Date Noted  . Hypertensive urgency 07/25/2016  . Foot swelling 04/28/2016  . History of breast cancer in female 08/13/2015  . Osteoporosis 07/01/2015  . Dehydration 06/11/2015  . Acute blood loss anemia   . Thrombocytosis ([place])   . Faintness   . Closed left hip fracture ([place]) 05/08/2015  . Leukocytosis 05/08/2015  . Fracture, intertrochanteric, left femur ([place]) 05/08/2015  . HTN (hypertension) 08/27/2010  . MVP (mitral valve prolapse) 08/27/2010  . Malignant neoplasm of female breast ([place]) 06/20/2007  .  Polycythemia vera ([place]) 06/20/2007  . ABDOMINAL PAIN, EPIGASTRIC 06/20/2007  . Atrophic gastritis 06/19/2007  . DIVERTICULOSIS OF COLON 06/19/2007  . ARTHRITIS 06/19/2007    Past Surgical History:  Procedure Laterality Date  . ABDOMINAL HYSTERECTOMY     ovaries taken  . APPENDECTOMY    . BREAST LUMPECTOMY     right  . CARDIOVASCULAR STRESS TEST  12/23/2000   EF 70%  . CATARACT EXTRACTION     bilateral  . ELBOW FRACTURE SURGERY     left  . FEMUR IM NAIL Left 05/08/2015   Procedure: INTERTROCHANTERIC FEMORAL NAIL;  Surgeon: Rod Can, MD;  Location: [facility] ORS;  Service: Orthopedics;  Laterality: Left;  . HEMATOMA EVACUATION     right  . TONSILLECTOMY    . TRANSTHORACIC ECHOCARDIOGRAM  03/05/2010   EF 55-60%    OB History    No data available       Home Medications    Prior to Admission medications   Medication Sig Start Date End Date Taking? Authorizing Provider  amLODipine (NORVASC) 2.5 MG tablet Take 1.25 mg by mouth daily.   Yes [provider]  anagrelide (AGRYLIN) 1 MG capsule Take 2 capsules (2 mg total) by mouth 2 (two) times daily. 05/05/16  Yes Truitt Merle, MD  aspirin 81 MG tablet Take 81 mg by mouth daily.     Yes [provider]  Calcium Carbonate-Vitamin D (CALCIUM 500/D PO) Take 1  capsule by mouth daily.   Yes [provider]  colestipol (COLESTID) 1 g tablet Take 1-2 tablets (1-2 g total) by mouth daily. Reported on 05/14/2015 Patient taking differently: Take 1 g by mouth daily.  01/20/16  Yes Nandigam, Venia Minks, MD  hydrochlorothiazide (MICROZIDE) 12.5 MG capsule TAKE (1) CAPSULE DAILY. 06/22/16  Yes Nahser, Wonda Cheng, MD  lisinopril (PRINIVIL,ZESTRIL) 20 MG tablet Take 20 mg by mouth daily.   Yes [provider]  metoprolol succinate (TOPROL-XL) 50 MG 24 hr tablet TAKE 1 TABLET ONCE DAILY WITH FOOD. 07/19/16  Yes Belva Crome, MD  potassium chloride (K-DUR) 10 MEQ tablet TAKE 1 TABLET ONCE DAILY. 04/26/16  Yes Nahser, Wonda Cheng, MD  Probiotic Product ([place] PO) Take 1 tablet by mouth daily. Reported on 07/01/2015   Yes [provider]  Cetirizine HCl (ZYRTEC PO) Take 1 tablet by mouth as needed (AS NEEDED FOR ALLERGIES).     [provider]  fluticasone (FLONASE) 50 MCG/ACT nasal spray Place 2 sprays into the nose as needed for allergies or rhinitis. Reported on 06/11/2015    [provider]  loperamide (IMODIUM) 2 MG capsule Take 2 mg by mouth as needed for diarrhea or loose stools.    [provider]  pantoprazole (PROTONIX) 40 MG tablet Take 40 mg by mouth as needed (AS NEEDED FOR STOMACH).     [provider]    Family History Family History  Problem Relation Age of Onset  . Stomach cancer Mother   . Hypertension Father   . Stroke Father   . Prostate cancer Father   . Hypertension Sister   . Hypertension Brother   . Prostate cancer Brother   . Diabetes Brother   . Colon cancer Neg Hx     Social History Social History  Substance Use Topics  . Smoking status: Former Smoker    Quit date: 08/24/1980  . Smokeless tobacco: Never Used  . Alcohol use Yes     Comment: occasional     Allergies   Sulfur   Review of Systems Review of Systems  Constitutional: Negative for activity change, chills, fatigue and fever.  HENT: Negative for congestion and sore throat.   Eyes: Negative for visual disturbance.  Respiratory: Negative for cough and shortness of breath.   Cardiovascular: Negative for chest pain.  Gastrointestinal: Positive for diarrhea. Negative for abdominal pain, blood in stool, constipation, nausea and vomiting.  Genitourinary: Negative for dysuria, flank pain and frequency.  Musculoskeletal: Negative for back pain, myalgias, neck pain and neck stiffness.  Skin: Negative for rash and wound.  Neurological: Positive for speech difficulty, light-headedness and headaches. Negative for dizziness, syncope, weakness and numbness.    Psychiatric/Behavioral: Positive for confusion.  All other systems reviewed and are negative.    Physical Exam Updated Vital Signs BP (!) 207/93   Pulse 85   Resp 16   Ht 5\' 4"  (1.626 m)   Wt 40.8 kg (90 lb)   SpO2 95%   BMI 15.45 kg/m   Physical Exam  Constitutional: She is oriented to person, place, and time. She appears well-developed and well-nourished. No distress.  HENT:  Head: Normocephalic and atraumatic.  Mouth/Throat: Oropharynx is clear and moist. No oropharyngeal exudate.  Eyes: EOM are normal. Pupils are equal, round, and reactive to light.  Neck: Normal range of motion. Neck supple.  Cardiovascular: Normal rate and regular rhythm.  Exam reveals no gallop and no friction rub.   No murmur  heard. Pulmonary/Chest: Effort normal and breath sounds normal. No respiratory distress. She has no wheezes. She has no rales. She exhibits no tenderness.  Abdominal: Soft. Bowel sounds are normal. There is no tenderness. There is no rebound and no guarding.  Musculoskeletal: Normal range of motion. She exhibits no edema or tenderness.  3+ right lower extremity pitting edema. 2+ left lower extremity pitting edema. No calf tenderness. Distal pulses intact.  Neurological: She is alert and oriented to person, place, and time.  Patient is alert and oriented x3 with clear, goal oriented speech. Patient has 5/5 motor in all extremities. Sensation is intact to light touch. Bilateral finger-to-nose is normal with no signs of dysmetria. Patient has a normal gait and walks without assistance.  Skin: Skin is warm and dry. Capillary refill takes less than 2 seconds. No rash noted. She is not diaphoretic. No erythema.  Psychiatric: She has a normal mood and affect. Her behavior is normal.  Nursing note and vitals reviewed.    ED Treatments / Results  Labs (all labs ordered are listed, but only abnormal results are displayed) Labs Reviewed  COMPREHENSIVE METABOLIC PANEL - Abnormal; Notable  for the following:       Result Value   Glucose, Bld 123 (*)    BUN 26 (*)    Creatinine, Ser 1.09 (*)    ALT 10 (*)    GFR calc non Af Amer 44 (*)    GFR calc Af Amer 51 (*)    All other components within normal limits  CBC - Abnormal; Notable for the following:    WBC 12.5 (*)    RBC 7.12 (*)    MCV 63.3 (*)    MCH 19.8 (*)    RDW 20.3 (*)    All other components within normal limits  URINALYSIS, ROUTINE W REFLEX MICROSCOPIC - Abnormal; Notable for the following:    Color, Urine STRAW (*)    Ketones, ur 5 (*)    All other components within normal limits  CBG MONITORING, ED - Abnormal; Notable for the following:    Glucose-Capillary 110 (*)    All other components within normal limits    EKG  EKG Interpretation None       Radiology Ct Head Wo Contrast  Result Date: 07/24/2016 CLINICAL DATA:  Difficulty speaking, hypertensive, confusion. History of polycythemia. EXAM: CT HEAD WITHOUT CONTRAST TECHNIQUE: Contiguous axial images were obtained from the base of the skull through the vertex without intravenous contrast. COMPARISON:  None. FINDINGS: Brain: Mild generalized age related parenchymal atrophy with commensurate dilatation of the ventricles and sulci. Chronic small vessel ischemic changes within the bilateral periventricular and subcortical white matter regions. There is no mass, hemorrhage, edema or other evidence of acute parenchymal abnormality. No extra-axial hemorrhage. Incidental note made of a probable calcified meningioma within the left temporal fossa. Vascular: There are chronic calcified atherosclerotic changes of the large vessels at the skull base. No unexpected hyperdense vessel. Skull: Normal. Negative for fracture or focal lesion. Sinuses/Orbits: No acute finding. Other: None. IMPRESSION: 1. No acute intracranial abnormality. No intracranial hemorrhage or edema. 2. Atrophy and chronic ischemic changes in the white matter. Electronically Signed   By: Franki Cabot M.D.   On: 07/24/2016 22:42    Procedures Procedures (including critical care time)  Medications Ordered in ED Medications  labetalol (NORMODYNE,TRANDATE) injection 10 mg (not administered)  amLODipine (NORVASC) tablet 2.5 mg (not administered)  labetalol (NORMODYNE,TRANDATE) injection 10 mg (10 mg Intravenous Given 07/24/16 2243)  Initial Impression / Assessment and Plan / ED Course  I have reviewed the triage vital signs and the nursing notes.  Pertinent labs & imaging results that were available during my care of the patient were reviewed by me and considered in my medical decision making (see chart for details).     Patient remains asymptomatic with a normal neurologic exam. CT head without any acute findings. Discussed with family and patient. Will admit to observation for possible hypertensive urgency versus TIA.  Final Clinical Impressions(s) / ED Diagnoses   Final diagnoses:  Hypertensive urgency  Expressive aphasia    New Prescriptions New Prescriptions   No medications on file     Julianne Rice, MD 07/25/16 0003

## 2016-07-25 ENCOUNTER — Observation Stay (HOSPITAL_COMMUNITY): Payer: Medicare Other

## 2016-07-25 ENCOUNTER — Encounter (HOSPITAL_COMMUNITY): Payer: Self-pay | Admitting: Internal Medicine

## 2016-07-25 DIAGNOSIS — I160 Hypertensive urgency: Secondary | ICD-10-CM | POA: Diagnosis present

## 2016-07-25 DIAGNOSIS — G039 Meningitis, unspecified: Secondary | ICD-10-CM

## 2016-07-25 DIAGNOSIS — R410 Disorientation, unspecified: Secondary | ICD-10-CM | POA: Diagnosis not present

## 2016-07-25 LAB — LIPID PANEL
Cholesterol: 131 mg/dL (ref 0–200)
HDL: 46 mg/dL (ref >40)
LDL CALC: 65 mg/dL (ref 0–99)
TRIGLYCERIDES: 101 mg/dL (ref <150)
Total CHOL/HDL Ratio: 2.8 RATIO
VLDL: 20 mg/dL (ref 0–40)

## 2016-07-25 LAB — BASIC METABOLIC PANEL
ANION GAP: 8 (ref 5–15)
BUN: 27 mg/dL — AB (ref 6–20)
CO2: 24 mmol/L (ref 22–32)
Calcium: 8.8 mg/dL — ABNORMAL LOW (ref 8.9–10.3)
Chloride: 103 mmol/L (ref 101–111)
Creatinine, Ser: 1.13 mg/dL — ABNORMAL HIGH (ref 0.44–1.00)
GFR calc Af Amer: 49 mL/min — ABNORMAL LOW (ref >60)
GFR, EST NON AFRICAN AMERICAN: 42 mL/min — AB (ref >60)
Glucose, Bld: 133 mg/dL — ABNORMAL HIGH (ref 65–99)
POTASSIUM: 4.3 mmol/L (ref 3.5–5.1)
SODIUM: 135 mmol/L (ref 135–145)

## 2016-07-25 LAB — PHOSPHORUS: Phosphorus: 2.8 mg/dL (ref 2.5–4.6)

## 2016-07-25 LAB — CBC WITH DIFFERENTIAL/PLATELET
Basophils Absolute: 0 10*3/uL (ref 0.0–0.1)
Basophils Relative: 0 %
EOS ABS: 0.3 10*3/uL (ref 0.0–0.7)
Eosinophils Relative: 2 %
HCT: 39.9 % (ref 36.0–46.0)
Hemoglobin: 12 g/dL (ref 12.0–15.0)
Lymphocytes Relative: 4 %
Lymphs Abs: 0.6 10*3/uL — ABNORMAL LOW (ref 0.7–4.0)
MCH: 19 pg — ABNORMAL LOW (ref 26.0–34.0)
MCHC: 30.1 g/dL (ref 30.0–36.0)
MCV: 63.3 fL — ABNORMAL LOW (ref 78.0–100.0)
MONO ABS: 0.7 10*3/uL (ref 0.1–1.0)
Monocytes Relative: 5 %
NEUTROS PCT: 89 %
Neutro Abs: 13.3 10*3/uL — ABNORMAL HIGH (ref 1.7–7.7)
PLATELETS: 306 10*3/uL (ref 150–400)
RBC: 6.3 MIL/uL — AB (ref 3.87–5.11)
RDW: 20.1 % — ABNORMAL HIGH (ref 11.5–15.5)
WBC: 14.9 10*3/uL — AB (ref 4.0–10.5)

## 2016-07-25 LAB — VITAMIN B12: Vitamin B-12: 692 pg/mL (ref 180–914)

## 2016-07-25 LAB — MAGNESIUM: MAGNESIUM: 2.1 mg/dL (ref 1.7–2.4)

## 2016-07-25 LAB — T4, FREE: Free T4: 1.12 ng/dL (ref 0.61–1.12)

## 2016-07-25 LAB — TSH: TSH: 5.593 u[IU]/mL — AB (ref 0.350–4.500)

## 2016-07-25 LAB — GLUCOSE, CAPILLARY: Glucose-Capillary: 122 mg/dL — ABNORMAL HIGH (ref 65–99)

## 2016-07-25 LAB — AMMONIA: Ammonia: 15 umol/L (ref 9–35)

## 2016-07-25 LAB — BRAIN NATRIURETIC PEPTIDE: B NATRIURETIC PEPTIDE 5: 272.8 pg/mL — AB (ref 0.0–100.0)

## 2016-07-25 MED ORDER — PANTOPRAZOLE SODIUM 40 MG PO TBEC: 40.0000 mg | DELAYED_RELEASE_TABLET | ORAL | Status: DC | PRN

## 2016-07-25 MED ORDER — POTASSIUM CHLORIDE ER 10 MEQ PO TBCR: 10.0000 meq | EXTENDED_RELEASE_TABLET | Freq: Every day | ORAL | Status: DC

## 2016-07-25 MED ORDER — FLUTICASONE PROPIONATE 50 MCG/ACT NA SUSP
2.0000 | NASAL | Status: DC | PRN
  Filled 2016-07-25: qty 16

## 2016-07-25 MED ORDER — ENSURE ENLIVE PO LIQD
237.0000 mL | Freq: Two times a day (BID) | ORAL | Status: DC
  Administered 2016-07-25 – 2016-07-29 (×5): 237 mL via ORAL

## 2016-07-25 MED ORDER — CALCIUM CARBONATE-VITAMIN D 500-200 MG-UNIT PO TABS
2.0000 | ORAL_TABLET | Freq: Every day | ORAL | Status: DC
  Administered 2016-07-25 – 2016-07-29 (×4): 2 via ORAL
  Filled 2016-07-25 (×4): qty 2

## 2016-07-25 MED ORDER — ANAGRELIDE HCL 0.5 MG PO CAPS
2.0000 mg | ORAL_CAPSULE | Freq: Two times a day (BID) | ORAL | Status: DC
  Administered 2016-07-25 – 2016-07-29 (×6): 2 mg via ORAL
  Filled 2016-07-25 (×11): qty 4

## 2016-07-25 MED ORDER — SODIUM CHLORIDE 0.9 % IV SOLN
INTRAVENOUS | Status: DC
  Administered 2016-07-25 – 2016-07-27 (×2): via INTRAVENOUS

## 2016-07-25 MED ORDER — LORAZEPAM 2 MG/ML IJ SOLN
INTRAMUSCULAR | Status: AC
  Filled 2016-07-25: qty 1

## 2016-07-25 MED ORDER — ONDANSETRON HCL 4 MG/2ML IJ SOLN: 4.0000 mg | Freq: Four times a day (QID) | INTRAMUSCULAR | Status: DC | PRN

## 2016-07-25 MED ORDER — SODIUM CHLORIDE 0.9 % IV SOLN
1000.0000 mg | Freq: Once | INTRAVENOUS | Status: AC
  Administered 2016-07-25: 1000 mg via INTRAVENOUS
  Filled 2016-07-25: qty 10

## 2016-07-25 MED ORDER — HYDRALAZINE HCL 20 MG/ML IJ SOLN
10.0000 mg | Freq: Three times a day (TID) | INTRAMUSCULAR | Status: DC | PRN
  Administered 2016-07-25: 10 mg via INTRAVENOUS
  Filled 2016-07-25: qty 0.5

## 2016-07-25 MED ORDER — METOPROLOL SUCCINATE ER 50 MG PO TB24
50.0000 mg | ORAL_TABLET | Freq: Every day | ORAL | Status: DC
  Administered 2016-07-25 – 2016-07-29 (×4): 50 mg via ORAL
  Filled 2016-07-25 (×2): qty 1
  Filled 2016-07-25 (×2): qty 2

## 2016-07-25 MED ORDER — ASPIRIN EC 81 MG PO TBEC
81.0000 mg | DELAYED_RELEASE_TABLET | Freq: Every day | ORAL | Status: DC
  Administered 2016-07-25 – 2016-07-29 (×4): 81 mg via ORAL
  Filled 2016-07-25 (×4): qty 1

## 2016-07-25 MED ORDER — ACETAMINOPHEN 325 MG PO TABS
650.0000 mg | ORAL_TABLET | Freq: Four times a day (QID) | ORAL | Status: DC | PRN
  Administered 2016-07-25: 650 mg via ORAL
  Filled 2016-07-25: qty 2

## 2016-07-25 MED ORDER — POTASSIUM CHLORIDE CRYS ER 10 MEQ PO TBCR
10.0000 meq | EXTENDED_RELEASE_TABLET | Freq: Every day | ORAL | Status: DC
  Administered 2016-07-25: 10 meq via ORAL
  Filled 2016-07-25: qty 1

## 2016-07-25 MED ORDER — AMLODIPINE BESYLATE 5 MG PO TABS
2.5000 mg | ORAL_TABLET | Freq: Once | ORAL | Status: AC
  Administered 2016-07-25: 2.5 mg via ORAL
  Filled 2016-07-25: qty 1

## 2016-07-25 MED ORDER — AMLODIPINE BESYLATE 5 MG PO TABS
5.0000 mg | ORAL_TABLET | Freq: Every day | ORAL | Status: DC
  Administered 2016-07-27 – 2016-07-29 (×3): 5 mg via ORAL
  Filled 2016-07-25 (×3): qty 1

## 2016-07-25 MED ORDER — HEPARIN SODIUM (PORCINE) 5000 UNIT/ML IJ SOLN
5000.0000 [IU] | Freq: Three times a day (TID) | INTRAMUSCULAR | Status: DC
  Administered 2016-07-25 – 2016-07-29 (×14): 5000 [IU] via SUBCUTANEOUS
  Filled 2016-07-25 (×13): qty 1

## 2016-07-25 MED ORDER — LOPERAMIDE HCL 2 MG PO CAPS: 2.0000 mg | ORAL_CAPSULE | ORAL | Status: DC | PRN

## 2016-07-25 MED ORDER — COLESTIPOL HCL 1 G PO TABS
1.0000 g | ORAL_TABLET | Freq: Every day | ORAL | Status: DC
  Administered 2016-07-25 – 2016-07-29 (×3): 1 g via ORAL
  Filled 2016-07-25 (×6): qty 1

## 2016-07-25 MED ORDER — HYDRALAZINE HCL 20 MG/ML IJ SOLN
10.0000 mg | INTRAMUSCULAR | Status: DC | PRN
  Administered 2016-07-26 – 2016-07-28 (×4): 10 mg via INTRAVENOUS
  Filled 2016-07-25 (×5): qty 1
  Filled 2016-07-25: qty 0.5

## 2016-07-25 MED ORDER — ONDANSETRON HCL 4 MG PO TABS: 4.0000 mg | ORAL_TABLET | Freq: Four times a day (QID) | ORAL | Status: DC | PRN

## 2016-07-25 MED ORDER — ACETAMINOPHEN 650 MG RE SUPP
650.0000 mg | Freq: Four times a day (QID) | RECTAL | Status: DC | PRN
  Administered 2016-07-26: 650 mg via RECTAL
  Filled 2016-07-25: qty 1

## 2016-07-25 MED ORDER — CLONIDINE HCL 0.1 MG PO TABS
0.1000 mg | ORAL_TABLET | Freq: Once | ORAL | Status: DC
  Filled 2016-07-25: qty 1

## 2016-07-25 MED ORDER — AMLODIPINE BESYLATE 5 MG PO TABS
2.5000 mg | ORAL_TABLET | Freq: Every day | ORAL | Status: DC
  Administered 2016-07-25: 2.5 mg via ORAL
  Filled 2016-07-25: qty 1

## 2016-07-25 MED ORDER — LISINOPRIL 20 MG PO TABS
20.0000 mg | ORAL_TABLET | Freq: Every day | ORAL | Status: DC
  Administered 2016-07-25 – 2016-07-29 (×4): 20 mg via ORAL
  Filled 2016-07-25 (×2): qty 1
  Filled 2016-07-25 (×2): qty 2

## 2016-07-25 MED ORDER — HYDROCHLOROTHIAZIDE 12.5 MG PO CAPS
12.5000 mg | ORAL_CAPSULE | Freq: Every day | ORAL | Status: DC
  Administered 2016-07-25 – 2016-07-28 (×3): 12.5 mg via ORAL
  Filled 2016-07-25 (×3): qty 1

## 2016-07-25 MED ORDER — LORAZEPAM 2 MG/ML IJ SOLN
1.0000 mg | Freq: Once | INTRAMUSCULAR | Status: AC
  Administered 2016-07-25: 1 mg via INTRAVENOUS

## 2016-07-25 NOTE — Progress Notes (Signed)
Rapid Response Event Note  Overview: Called to bedside to evaluate patient with hypertension, tremors, and altered mental status and possible seizures.  Initial Focused Assessment: Patient appears to be asleep, does not respond to any command or name being called.  Withdraws from pain.  Attempted to push my hands away when sternal rubbing patient.  Briefly and spontaneously opens eyes, but not to command.  Does not hold eye contact.  Bilateral arms with tremors, left greater than right.  Arms with increased tremors when BP cuff is measuring BP.   Patient has had head CT and MRI that do not show any acute findings.  Interventions: BP elevated 205/96, 166/110.  HR 100.   CBG obtained, reading 122.   Rectal temp 100.2.  MD called to bedside.  Ativan 1mg  IV given x1 per MD order.  Neuro consulted by MD.  Patient ordered to receive Keppra IV, to be administered by bedside RN. Patient has had a head CT and MRI to rule out any acute findings earlier in the day.  Plan of Care (if not transferred): Patient to transfer to stepdown unit for further monitoring.  Event Summary: Patient with questionable seizure activity assessed and transferred to stepdown bed for continuous monitoring.  Karren Burly

## 2016-07-25 NOTE — Progress Notes (Addendum)
Patient was seen and examined at bedside. She was admitted early today. Please see H&P for detail.  83 year old female with history of osteoarthritis, atrophic gastritis, history of breast cancer, diverticulosis, hypertension, polycythemia presented with trouble finding words and questionable confusion while she was talking to her daughter yesterday evening. In the ER CT scan of head was negative for acute finding. She was found to have hypertensive urgency. Admitted for further evaluation.  Blood pressure 161/66, heart rate 76, oxygen saturation 97% room air Gen.: Sitting on bed comfortable, not in distress, no facial droop, mild word finding difficulties Lungs clear bilaterally, no wheezing or crackle Cardiovascular: Regular rate rhythm, S1-S2 normal Muscle strength 5 over 5 in all extremities, sensory exam intact No lower extremity edema  Word finding difficulty and possible confusion on admission: Exact etiology unknown. Patient had hypertensive urgency on arrival. Blood pressure is better controlled. On examination she has mild word finding difficulties but she was able to come indicated well. Her muscle strength 5 over 5 in all extremities and no other focal neurological deficit. I have discussed with neurology on call Dr. Leonel Ramsay. Plan to get MRI of brain to further evaluate patient. -Also ordered vitamin B12, TSH, ammonia level. Check lipid panel. -Continue neuro check -Depending on MRI result, may decide further workup. -I will order PT OT evaluation.  Hypertensive urgency: Blood pressure is better controlled. Continue home medications. I increased the dose of Norvasc to 5 mg. Continue to monitor.  Mild leukocytosis which is chronic. Continue to monitor. Patient is afebrile. UA negative for UTI. She does not Pulmonary symptoms. Continue to monitor.  DVT prophylaxis with heparin subcutaneous. Continue current medical and supportive care.   Addendum 3:00 PM Met with patient's  son, daughter and granddaughter Clinical condition updated.  Patient has no focal deficit, speech improving, no muscle weakness, symmetric in all extremities.  -change MRI brain to stat order -TSH noted to be high, check free T3, T4 level -already on ASA. LDL acceptable.  -continue rehab and further tests depending on MRI results.  I spent about 25 minutes at bedside. Also discussed with pt's RN.   Addendum: 5:54 PM  Patient was re-evaluated after MRI. She is alert, awake, has difficulty word finding and mildly confused. Muscle strength 5/5 in all and symmetric.  Grand son at bedside.   MRI brain reviewed with Dr. Leonel Ramsay from Neurology. No acute stroke. He recommended to monitor mental status. Check EEG and re-consult to neurology if there is no improvement in mental status by tomorrow.  BP was 191/89  Likely hypertensive encephalopathy She has chronic leukocytosis, afebrile.  -start IVF, supportive care, added hydralazine IV prn MRI: right subfrontal extra-axial mass with out edema, likely incidental meningioma. I am planning to consult Neurosurgery in am.   I called patient's son and updated the test results and plan of care. Also discussed with patient's grandson at bedside.

## 2016-07-25 NOTE — Progress Notes (Signed)
While receiving report from day RN around 1900, patient was lying in bed with eyes closed. Pt did open her eyes and attempt to smile when RN greeted her. RN asked pt her name, and pt attempted to respond but words were garbled and mumbled. Pt then stopped responding to commands but continued to make eye contact at times. Pt would not raise hands or follow any other commands. MD notified and Rapid Response was called. With MD at bedside. Ativan 1mg  IVP was given at 2000. IV Keppra was also given as ordered and is currently infusing. BP was elevated during episode up to 212/118. Pt also had tremors and upper body was shaking very visibly. Shaking has slowed at this point- only left arm continues to shake slightly at times. Patient currently appears to be sleeping in bed, eyes closed. Pt did urinate and defecate a small amount of diarrhea on herself at this time. Daughter at bedside- patient does not acknowledge daughter or follow commands but does respond to painful stimuli. Will transfer to SDU when bed available.

## 2016-07-25 NOTE — Progress Notes (Signed)
Patient transferred to SDU room 1228. Report given to [employer]. Patient continued to rest comfortably in bed with eyes closed upon transfer. No signs of pain or discomfort. Mild tremors to BUE. BP 171/99, HR 96. Continued to be in NSR. Patient was again incontinent of urine. Son and daughter present and aware of transfer.

## 2016-07-25 NOTE — Progress Notes (Signed)
Pt returned from MRI. PT unable to follow instructions for neuro assessment. Unable to smile or lift hands. Updated MD. MD will look at MRI results and call back

## 2016-07-25 NOTE — Progress Notes (Signed)
MD with new order to remove telemetry for MRI procedure only.

## 2016-07-25 NOTE — Progress Notes (Signed)
Administered 10mg  hydralazine. PT responsive and at baseline A&Ox4. grandson in room. PT eating dinner. Neuro checks at baseline. WDL.

## 2016-07-25 NOTE — H&P (Signed)
History and Physical    [name] ZOX:[id number] DOB: 1926/03/30 DOA: 07/24/2016  PCP: Deland Pretty, MD   Patient coming from: Home.  I have personally briefly reviewed patient's old medical records in [place]  Chief Complaint: Difficulty speaking and high blood pressure.  HPI: [name] is a 83 y.o. female with medical history significant of osteoarthritis, atrophic gastritis, breast cancer, history of candidal esophagitis, diverticulosis, hypertension, IBS, mitral valve prolapse, polycythemia, PVCs, rhinitis, unsteady gait who was brought to the emergency department due to trouble finding words and and questionable confusion while talking to her daughter at around [place] earlier this evening. The patient is not very sure about having these symptoms. However, she complains about having frontal headache. She denies fever, chills, blurred vision, focal weakness or numbness, chest pain, dyspnea, palpitations, dizziness, diaphoresis or pitting edema of the lower extremities. She denies productive cough, hemoptysis, abdominal pain, nausea, emesis, diarrhea, melena or hematochezia. She denies dysuria or gross hematuria.  ED Course: Initial vital signs in the emergency department showed a blood pressure of 212/99 mmHg, pulse of 89, respirations of 17 and O2 sat 94-95% on room air. WBC 12.5, hemoglobin 14.1 g/dL and platelets 303. Her sodium was 137, potassium 4.6, chloride 107 and bicarbonate 24 mmol/L. BUN was 26, creatinine 1.09 and glucose 123. EKG was sinus rhythm with APCs and age undetermined anteroseptal infarct.   Imaging: CT scan of the brain without contrast did not show any acute intracranial pathology. Please see images and full radiology report for further detail.   Review of Systems: As per HPI otherwise 10 point review of systems negative.    Past Medical History:  Diagnosis Date  . Acute blood loss anemia   . Arthritis   . Atrophic gastritis   . Breast cancer  ([place])    right  . Candida esophagitis ([place])   . Chest tightness   . Chronic gastritis   . Closed intertrochanteric fracture of left femur with routine healing   . Diverticulosis   . Hypertension   . IBS (irritable bowel syndrome)   . MVP (mitral valve prolapse)   . Palpitation   . Polycythemia   . PVC's (premature ventricular contractions)   . Rhinitis   . Unsteady gait     Past Surgical History:  Procedure Laterality Date  . ABDOMINAL HYSTERECTOMY     ovaries taken  . APPENDECTOMY    . BREAST LUMPECTOMY     right  . CARDIOVASCULAR STRESS TEST  12/23/2000   EF 70%  . CATARACT EXTRACTION     bilateral  . ELBOW FRACTURE SURGERY     left  . FEMUR IM NAIL Left 05/08/2015   Procedure: INTERTROCHANTERIC FEMORAL NAIL;  Surgeon: Rod Can, MD;  Location: [facility];  Service: Orthopedics;  Laterality: Left;  . HEMATOMA EVACUATION     right  . TONSILLECTOMY    . TRANSTHORACIC ECHOCARDIOGRAM  03/05/2010   EF 55-60%     reports that she quit smoking about 35 years ago. She has never used smokeless tobacco. She reports that she drinks alcohol. She reports that she does not use drugs.  Allergies  Allergen Reactions  . Sulfur Other (See Comments)    Pt doesn't remember reaction.    Family History  Problem Relation Age of Onset  . Stomach cancer Mother   . Hypertension Father   . Stroke Father   . Prostate cancer Father   . Hypertension Sister   . Hypertension Brother   .  Prostate cancer Brother   . Diabetes Brother   . Colon cancer Neg Hx     Prior to Admission medications   Medication Sig Start Date End Date Taking? Authorizing Provider  amLODipine (NORVASC) 2.5 MG tablet Take 1.25 mg by mouth daily.   Yes [provider]  anagrelide (AGRYLIN) 1 MG capsule Take 2 capsules (2 mg total) by mouth 2 (two) times daily. 05/05/16  Yes Truitt Merle, MD  aspirin 81 MG tablet Take 81 mg by mouth daily.     Yes [provider]  Calcium Carbonate-Vitamin D (CALCIUM  500/D PO) Take 1 capsule by mouth daily.   Yes [provider]  colestipol (COLESTID) 1 g tablet Take 1-2 tablets (1-2 g total) by mouth daily. Reported on 05/14/2015 Patient taking differently: Take 1 g by mouth daily.  01/20/16  Yes Nandigam, Venia Minks, MD  hydrochlorothiazide (MICROZIDE) 12.5 MG capsule TAKE (1) CAPSULE DAILY. 06/22/16  Yes Nahser, Wonda Cheng, MD  lisinopril (PRINIVIL,ZESTRIL) 20 MG tablet Take 20 mg by mouth daily.   Yes [provider]  metoprolol succinate (TOPROL-XL) 50 MG 24 hr tablet TAKE 1 TABLET ONCE DAILY WITH FOOD. 07/19/16  Yes Belva Crome, MD  potassium chloride (K-DUR) 10 MEQ tablet TAKE 1 TABLET ONCE DAILY. 04/26/16  Yes Nahser, Wonda Cheng, MD  Probiotic Product ([place] PO) Take 1 tablet by mouth daily. Reported on 07/01/2015   Yes [provider]  Cetirizine HCl (ZYRTEC PO) Take 1 tablet by mouth as needed (AS NEEDED FOR ALLERGIES).     [provider]  fluticasone (FLONASE) 50 MCG/ACT nasal spray Place 2 sprays into the nose as needed for allergies or rhinitis. Reported on 06/11/2015    [provider]  loperamide (IMODIUM) 2 MG capsule Take 2 mg by mouth as needed for diarrhea or loose stools.    [provider]  pantoprazole (PROTONIX) 40 MG tablet Take 40 mg by mouth as needed (AS NEEDED FOR STOMACH).     [provider]    Physical Exam: Vitals:   07/25/16 0000 07/25/16 0015 07/25/16 0047 07/25/16 0058  BP: (!) 198/92  (!) 213/94 (!) 195/87  Pulse: 81 81 80   Resp: 15 19 18    Temp:   97.8 F (36.6 C)   TempSrc:   Oral   SpO2: 94% 95% 96%   Weight:   41.1 kg (90 lb 9.6 oz)   Height:   5\' 2"  (1.575 m)     Constitutional: NAD, calm, comfortable Eyes: PERRL, lids and conjunctivae normal ENMT: Mucous membranes are moist. Posterior pharynx clear of any exudate or lesions. Neck: normal, supple, no masses, no thyromegaly Respiratory: Mild bibasilar rales, normal respiratory  effort.No accessory muscle use.  Cardiovascular: Regular rate and rhythm, no murmurs / rubs / gallops. Bilateral lower extremity edema, R>L. 2+ pedal pulses. No carotid bruits.  Abdomen: Soft, no tenderness, no masses palpated. No hepatosplenomegaly. Bowel sounds positive.  Musculoskeletal: no clubbing / cyanosis. Good ROM, no contractures. Normal muscle tone.  Skin: no significant rashes, lesions, ulcers on limited skin exam Neurologic: CN 2-12 grossly intact. Sensation intact, DTR normal. Strength 5/5 in all 4.  Psychiatric: Normal judgment and insight. Alert and oriented x4. Normal mood.    Labs on Admission: I have personally reviewed following labs and imaging studies  CBC:  Recent Labs Lab 07/24/16 2027  WBC 12.5*  HGB 14.1  HCT 45.1  MCV 63.3*  PLT 403   Basic Metabolic Panel:  Recent Labs Lab 07/24/16 2027  NA 137  K 4.6  CL 107  CO2 24  GLUCOSE 123*  BUN 26*  CREATININE 1.09*  CALCIUM 9.3   GFR: Estimated Creatinine Clearance: 23.1 mL/min (A) (by C-G formula based on SCr of 1.09 mg/dL (H)). Liver Function Tests:  Recent Labs Lab 07/24/16 2027  AST 17  ALT 10*  ALKPHOS 71  BILITOT 0.5  PROT 6.7  ALBUMIN 4.0   No results for input(s): LIPASE, AMYLASE in the last 168 hours. No results for input(s): AMMONIA in the last 168 hours. Coagulation Profile: No results for input(s): INR, PROTIME in the last 168 hours. Cardiac Enzymes: No results for input(s): CKTOTAL, CKMB, CKMBINDEX, TROPONINI in the last 168 hours. BNP (last 3 results) No results for input(s): PROBNP in the last 8760 hours. HbA1C: No results for input(s): HGBA1C in the last 72 hours. CBG:  Recent Labs Lab 07/24/16 2104  GLUCAP 110*   Lipid Profile: No results for input(s): CHOL, HDL, LDLCALC, TRIG, CHOLHDL, LDLDIRECT in the last 72 hours. Thyroid Function Tests: No results for input(s): TSH, T4TOTAL, FREET4, T3FREE, THYROIDAB in the last 72 hours. Anemia Panel: No results for  input(s): VITAMINB12, FOLATE, FERRITIN, TIBC, IRON, RETICCTPCT in the last 72 hours. Urine analysis:    Component Value Date/Time   COLORURINE STRAW (A) 07/24/2016 2137   APPEARANCEUR CLEAR 07/24/2016 2137   LABSPEC 1.012 07/24/2016 2137   PHURINE 6.0 07/24/2016 2137   GLUCOSEU NEGATIVE 07/24/2016 2137   HGBUR NEGATIVE 07/24/2016 2137   BILIRUBINUR NEGATIVE 07/24/2016 2137   KETONESUR 5 (A) 07/24/2016 2137   PROTEINUR NEGATIVE 07/24/2016 2137   UROBILINOGEN 0.2 08/27/2006 2231   NITRITE NEGATIVE 07/24/2016 2137   LEUKOCYTESUR NEGATIVE 07/24/2016 2137    Radiological Exams on Admission: Ct Head Wo Contrast  Result Date: 07/24/2016 CLINICAL DATA:  Difficulty speaking, hypertensive, confusion. History of polycythemia. EXAM: CT HEAD WITHOUT CONTRAST TECHNIQUE: Contiguous axial images were obtained from the base of the skull through the vertex without intravenous contrast. COMPARISON:  None. FINDINGS: Brain: Mild generalized age related parenchymal atrophy with commensurate dilatation of the ventricles and sulci. Chronic small vessel ischemic changes within the bilateral periventricular and subcortical white matter regions. There is no mass, hemorrhage, edema or other evidence of acute parenchymal abnormality. No extra-axial hemorrhage. Incidental note made of a probable calcified meningioma within the left temporal fossa. Vascular: There are chronic calcified atherosclerotic changes of the large vessels at the skull base. No unexpected hyperdense vessel. Skull: Normal. Negative for fracture or focal lesion. Sinuses/Orbits: No acute finding. Other: None. IMPRESSION: 1. No acute intracranial abnormality. No intracranial hemorrhage or edema. 2. Atrophy and chronic ischemic changes in the white matter. Electronically Signed   By: Franki Cabot M.D.   On: 07/24/2016 22:42   Echo 05/2015 ------------------------------------------------------------------- LV EF: 60% -    65%  ------------------------------------------------------------------- Indications:      Syncope 780.2.  ------------------------------------------------------------------- History:   Risk factors:  Hypertension.  ------------------------------------------------------------------- Study Conclusions  - Left ventricle: The cavity size was normal. Wall thickness was   normal. Systolic function was normal. The estimated ejection   fraction was in the range of 60% to 65%. Wall motion was normal;   there were no regional wall motion abnormalities. Doppler   parameters are consistent with abnormal left ventricular   relaxation (grade 1 diastolic dysfunction).    EKG: Independently reviewed.  Vent. rate 77 BPM PR interval * ms QRS duration 94 ms QT/QTc 387/438 ms P-R-T axes 79 60  77 Sinus rhythm Atrial premature complex Anteroseptal infarct, age indeterminate  Assessment/Plan Principal Problem:   Hypertensive urgency Observation/telemetry. She was given labetalol 10 mg IVP 2 doses with minimal response. She was also administer a one-time dose of amlodipine 2.5 mg in the emergency department. I will ncrease amlodipine to 2.5 mg by mouth daily. Continue hydrochlorothiazide 12.5 mg by mouth daily. Continue lisinopril 20 mg by mouth daily. Continue metoprolol succinate 50 mg by mouth daily. Monitor blood pressure, renal function and electrolytes. Check BNP and echocardiogram in a.m. Consider checking renal artery Dopplers if BP is persistently high despite medical therapy.  Active Problems:   Polycythemia vera (HCC) Continue schedule phlebotomies. Monitor hematocrit and hemoglobin.    Leukocytosis Denies fever, chills, productive cough, nausea, emesis, diarrhea or urinary symptoms. Monitor white blood cell count.    Thrombocytosis (HCC) Continue aspirin and Agrylin. Monitor platelets.     DVT prophylaxis: Heparin SQ. Code Status: Full code. Family  Communication: Her daughter brought her to the emergency department. Disposition Plan: Admit for blood pressure control and further evaluation. Consults called:  Admission status: Observation/telemetry.   Reubin Milan MD [facility] Pager [phone].  If 7PM-7AM, please contact night-coverage [email or web address] Password [email or web address]  07/25/2016, 1:14 AM

## 2016-07-25 NOTE — Consult Note (Signed)
NEURO HOSPITALIST CONSULT NOTE   Requestig physician: Dr. Hal Hope  Reason for Consult: AMS with possible new onset seizures  History obtained from:  Daughter and Chart     HPI:                                                                                                                                          [name] is an 83 y.o. female who presented to the [facility] ED late Saturday evening with a chief complaint of confusion with difficulty speaking. BP was severely elevated in the ED with initial reading of 212/99. Also had leukocytosis of 12.5 in the ED which further increased to 14.9 by 0505 on Sunday. BUN/Cr ratio was elevated at 26/1.09.   Head CT in the ED showed no acute abnormality; atrophy and chronic ischemic changes in the white matter were noted.   Transaminases normal, ammonia normal, Mg normal, Na normal, Ca slightly low at 8.8 (normal albumin). B12 normal at 692.  BNP elevated at 272. TSH elevated at 5.5 with normal T4.   MRI obtained this evening revealed advanced atrophy, chronic microvascular ischemic changes, and a 10 x 14 x 9 mm RIGHT subfrontal extra-axial mass without edema, most likely representing a meningioma. Also noted was diffuse low T1 signal intensity bone marrow, likely related to polycythemia and thrombocytosis.  Notes reviewed. Per ED Triage note: "Pt comes in with complaints of difficulty speaking first noticed around 1830 this evening by family member. Family member states pt lives by herself and she is unsure when she became confused Hypertensive during triage but family member states this is normal for her. Hx of polycythemia. Pt alert to self, place, but some confusion with time, but she was able to correct herself.  Ambulatory to triage.  Denies pain."  On initial evaluation she had also endorsed having a right-sided posterior headache earlier on the day of admission. She had denied any visual changes, with no nausea or  vomiting. She also had denied any focal weakness or numbness, no fever or chills, no shortness of breath, cough or chest pain.   She declined during her admission, with AMS worsening to lethargy/obtundation. Also developed a fever exhibited intermittent bilateral upper extremity fine tremors thought possibly to be due to seizures. She was loaded with Keppra IV and tremors continued. EEG has been ordered. Neurology was called to evaluate.   Past Medical History:  Diagnosis Date  . Acute blood loss anemia   . Arthritis   . Atrophic gastritis   . Breast cancer ([place])    right  . Candida esophagitis ([place])   . Chest tightness   . Chronic gastritis   . Closed intertrochanteric fracture of left femur with routine healing   . Diverticulosis   . Hypertension   .  IBS (irritable bowel syndrome)   . MVP (mitral valve prolapse)   . Palpitation   . Polycythemia   . PVC's (premature ventricular contractions)   . Rhinitis   . Unsteady gait     Past Surgical History:  Procedure Laterality Date  . ABDOMINAL HYSTERECTOMY     ovaries taken  . APPENDECTOMY    . BREAST LUMPECTOMY     right  . CARDIOVASCULAR STRESS TEST  12/23/2000   EF 70%  . CATARACT EXTRACTION     bilateral  . ELBOW FRACTURE SURGERY     left  . FEMUR IM NAIL Left 05/08/2015   Procedure: INTERTROCHANTERIC FEMORAL NAIL;  Surgeon: Rod Can, MD;  Location: [facility];  Service: Orthopedics;  Laterality: Left;  . HEMATOMA EVACUATION     right  . TONSILLECTOMY    . TRANSTHORACIC ECHOCARDIOGRAM  03/05/2010   EF 55-60%    Family History  Problem Relation Age of Onset  . Stomach cancer Mother   . Hypertension Father   . Stroke Father   . Prostate cancer Father   . Hypertension Sister   . Hypertension Brother   . Prostate cancer Brother   . Diabetes Brother   . Colon cancer Neg Hx    Social History:  reports that she quit smoking about 35 years ago. She has never used smokeless tobacco. She reports that she drinks alcohol.  She reports that she does not use drugs.  Allergies  Allergen Reactions  . Sulfur Other (See Comments)    Pt doesn't remember reaction.    MEDICATIONS:                                                                                                                     Scheduled: . amLODipine  5 mg Oral Daily  . anagrelide  2 mg Oral BID  . aspirin EC  81 mg Oral Daily  . calcium-vitamin D  2 tablet Oral Q breakfast  . cloNIDine  0.1 mg Oral Once  . colestipol  1 g Oral Daily  . feeding supplement (ENSURE ENLIVE)  237 mL Oral BID BM  . heparin  5,000 Units Subcutaneous Q8H  . hydrochlorothiazide  12.5 mg Oral Daily  . lisinopril  20 mg Oral Daily  . LORazepam      . metoprolol succinate  50 mg Oral Daily  . potassium chloride  10 mEq Oral Daily    YQM:[id number], acetaminophen, fluticasone, hydrALAZINE, loperamide, ondansetron **OR** ondansetron (ZOFRAN) IV, pantoprazole   ROS:  Unable to obtain due to AMS. See HPI for initial ROS on admission.   Blood pressure (!) 190/96, pulse 99, temperature 100.3 F (37.9 C), temperature source Rectal, resp. rate 20, height 5\' 2"  (1.575 m), weight 41.1 kg (90 lb 9.6 oz), SpO2 100 %.  General Examination:                                                                                                      General: Frail appearing HEENT-  [place]/AT. Moderate nuchal rigidity    Lungs- Respirations unlabored.  Extremities- No pallor or cyanosis. No Kernig or Brudzinski sign.  Skin-Some patchy flushing. Skin warm to touch, consistent with fever.   Neurological Examination Mental Status: Lethargic/obtunded. Withdraws and flails all 4 extremities semipurposefully to noxious stimuli.  Mumbles incoherently in response to noxious. Briefly opens eyes to noxious but does not fixate on visual stimuli or visually track.   Cranial Nerves: II: No blink to threat. Closes eyes in response to penlight. PERRL.  III,IV, VI: Unable to test EOM; eyes are conjugate when eyelids are opened by examiner V,VII: Face grossly symmetric. Reacts to noxious bilaterally.  VIII: Reacts to some auditory stimuli IX,X: Unable to assess XI: Unable to formally assess XII: Unable to assess Motor/Sensory: Withdraws and flails all 4 extremities semipurposefully to noxious stimuli. Maximum strength elicited is 4/5 in upper and lower extremities, without asymmetry.  Decreased muscle tone x 4. Moderately decreased muscle bulk x 4.  Deep Tendon Reflexes: 2+ bilateral upper extremities. 3+ patellae. Toes upgoing bilaterally.  Cerebellar/Gait: Unable to assess.    Lab Results: Basic Metabolic Panel:  Recent Labs Lab 07/24/16 2027 07/25/16 0505  NA 137 135  K 4.6 4.3  CL 107 103  CO2 24 24  GLUCOSE 123* 133*  BUN 26* 27*  CREATININE 1.09* 1.13*  CALCIUM 9.3 8.8*  MG  --  2.1  PHOS  --  2.8    Liver Function Tests:  Recent Labs Lab 07/24/16 2027  AST 17  ALT 10*  ALKPHOS 71  BILITOT 0.5  PROT 6.7  ALBUMIN 4.0   No results for input(s): LIPASE, AMYLASE in the last 168 hours.  Recent Labs Lab 07/25/16 1134  AMMONIA 15    CBC:  Recent Labs Lab 07/24/16 2027 07/25/16 0505  WBC 12.5* 14.9*  NEUTROABS  --  13.3*  HGB 14.1 12.0  HCT 45.1 39.9  MCV 63.3* 63.3*  PLT 303 306    Cardiac Enzymes: No results for input(s): CKTOTAL, CKMB, CKMBINDEX, TROPONINI in the last 168 hours.  Lipid Panel:  Recent Labs Lab 07/25/16 0600  CHOL 131  TRIG 101  HDL 46  CHOLHDL 2.8  VLDL 20  LDLCALC 65    CBG:  Recent Labs Lab 07/24/16 2104 07/25/16 1934  GLUCAP 110* 122*    Microbiology: Results for orders placed or performed in visit on 06/30/16  TECHNOLOGIST REVIEW     Status: None   Collection Time: 06/30/16 10:32 AM  Result Value Ref Range Status   Technologist Review occ schistocyte  Final     Coagulation  Studies: No results for input(s): LABPROT, INR in the last 72 hours.  Imaging: Ct Head Wo Contrast  Result Date: 07/24/2016 CLINICAL DATA:  Difficulty speaking, hypertensive, confusion. History of polycythemia. EXAM: CT HEAD WITHOUT CONTRAST TECHNIQUE: Contiguous axial images were obtained from the base of the skull through the vertex without intravenous contrast. COMPARISON:  None. FINDINGS: Brain: Mild generalized age related parenchymal atrophy with commensurate dilatation of the ventricles and sulci. Chronic small vessel ischemic changes within the bilateral periventricular and subcortical white matter regions. There is no mass, hemorrhage, edema or other evidence of acute parenchymal abnormality. No extra-axial hemorrhage. Incidental note made of a probable calcified meningioma within the left temporal fossa. Vascular: There are chronic calcified atherosclerotic changes of the large vessels at the skull base. No unexpected hyperdense vessel. Skull: Normal. Negative for fracture or focal lesion. Sinuses/Orbits: No acute finding. Other: None. IMPRESSION: 1. No acute intracranial abnormality. No intracranial hemorrhage or edema. 2. Atrophy and chronic ischemic changes in the white matter. Electronically Signed   By: Franki Cabot M.D.   On: 07/24/2016 22:42   Mr Brain Wo Contrast  Result Date: 07/25/2016 CLINICAL DATA:  Confusion, difficulty speaking, high blood pressure. Symptoms began yesterday. History of polycythemia and thrombocytosis. EXAM: MRI HEAD WITHOUT CONTRAST TECHNIQUE: Multiplanar, multiecho pulse sequences of the brain and surrounding structures were obtained without intravenous contrast. COMPARISON:  CT head 07/24/2016. FINDINGS: Brain: No evidence for acute infarction, hemorrhage, or extra-axial fluid. Generalized atrophy. Hydrocephalus ex vacuo. Chronic microvascular ischemic change. Along the cribriform plate, RIGHT paramedian location, an extra-axial mass measuring  10 x 14 x 9 mm is present, displacing the gyrus rectus without edema, most consistent with a meningioma. Vascular: Normal flow voids. Skull and upper cervical spine: There is mild expansion of the clivus, and low signal T1 intensity bone marrow in the clivus and visualized upper cervical region. This is likely hematologic in origin. Sinuses/Orbits: Negative. Other: None. IMPRESSION: Advanced atrophy. Chronic microvascular ischemic change. No acute stroke. 10 x 14 x 9 mm RIGHT subfrontal extra-axial mass, without edema, likely incidental meningioma in this elderly female. Diffuse low T1 signal intensity bone marrow, likely related to polycythemia and thrombocytosis. Electronically Signed   By: Staci Righter M.D.   On: 07/25/2016 17:48    Assessment: 83 year old female presenting with confusion, progressing to lethargy 1. AMS in conjunction with fever and leukocytosis militate in favor of meningoencephalitis, although other DDx including volume depletion, toxic/metabolic or pneumonia/septicemia also should be considered. Vasculitis possible, but considering the epidemiology, would be low on the DDx.  2. Seizures lower on the DDx given upper extremity movements more consistent with tremor. However seizures on DDx and meningioma seen on MRI could serve as an irritative lesion.   3. MRI reveals advanced atrophy and chronic microvascular ischemic changes. Also noted is a 10 x 14 x 9 mm RIGHT subfrontal extra-axial mass, without edema, likely meningioma in this elderly female.   Recommendations: 1. Lumbar puncture completed, with CSF labs pending: Cell count with differential, protein, glucose, gram stain, culture, HSV PCR, VDRL, cryptococcal antigen, IgG index.  2. Meningitis-dose antibiotics started: Acyclovir, ceftriaxone, amplcillin and vancomycin. Appreciate pharmacy assistance.  3. Keppra loaded. Continue scheduled dosing at 500 mg BID. 4. EEG  Electronically signed: Dr. Kerney Elbe 07/25/2016, 8:20  PM

## 2016-07-25 NOTE — Progress Notes (Signed)
MD called back. Updated MD with VS. MD with new orders. Pt able to follow some commands at this time. MD now in pts room to assess.

## 2016-07-26 ENCOUNTER — Inpatient Hospital Stay (HOSPITAL_COMMUNITY)
Admit: 2016-07-26 | Discharge: 2016-07-26 | Disposition: A | Discharge status: Home / Self Care | Payer: Medicare Other | Attending: Nephrology | Admitting: Nephrology

## 2016-07-26 ENCOUNTER — Inpatient Hospital Stay (HOSPITAL_COMMUNITY): Payer: Medicare Other

## 2016-07-26 DIAGNOSIS — R946 Abnormal results of thyroid function studies: Secondary | ICD-10-CM | POA: Diagnosis present

## 2016-07-26 DIAGNOSIS — E876 Hypokalemia: Secondary | ICD-10-CM | POA: Diagnosis not present

## 2016-07-26 DIAGNOSIS — D329 Benign neoplasm of meninges, unspecified: Secondary | ICD-10-CM | POA: Diagnosis present

## 2016-07-26 DIAGNOSIS — E43 Unspecified severe protein-calorie malnutrition: Secondary | ICD-10-CM | POA: Diagnosis not present

## 2016-07-26 DIAGNOSIS — R109 Unspecified abdominal pain: Secondary | ICD-10-CM | POA: Diagnosis present

## 2016-07-26 DIAGNOSIS — I160 Hypertensive urgency: Secondary | ICD-10-CM | POA: Diagnosis not present

## 2016-07-26 DIAGNOSIS — K529 Noninfective gastroenteritis and colitis, unspecified: Secondary | ICD-10-CM | POA: Diagnosis not present

## 2016-07-26 DIAGNOSIS — R2689 Other abnormalities of gait and mobility: Secondary | ICD-10-CM | POA: Diagnosis not present

## 2016-07-26 DIAGNOSIS — M6281 Muscle weakness (generalized): Secondary | ICD-10-CM | POA: Diagnosis not present

## 2016-07-26 DIAGNOSIS — R7989 Other specified abnormal findings of blood chemistry: Secondary | ICD-10-CM | POA: Diagnosis present

## 2016-07-26 DIAGNOSIS — G9340 Encephalopathy, unspecified: Secondary | ICD-10-CM | POA: Diagnosis not present

## 2016-07-26 DIAGNOSIS — G0490 Encephalitis and encephalomyelitis, unspecified: Secondary | ICD-10-CM | POA: Insufficient documentation

## 2016-07-26 DIAGNOSIS — D45 Polycythemia vera: Secondary | ICD-10-CM | POA: Diagnosis present

## 2016-07-26 DIAGNOSIS — R569 Unspecified convulsions: Secondary | ICD-10-CM | POA: Diagnosis not present

## 2016-07-26 DIAGNOSIS — Z882 Allergy status to sulfonamides status: Secondary | ICD-10-CM | POA: Diagnosis not present

## 2016-07-26 DIAGNOSIS — E871 Hypo-osmolality and hyponatremia: Secondary | ICD-10-CM | POA: Diagnosis present

## 2016-07-26 DIAGNOSIS — R197 Diarrhea, unspecified: Secondary | ICD-10-CM | POA: Diagnosis present

## 2016-07-26 DIAGNOSIS — I341 Nonrheumatic mitral (valve) prolapse: Secondary | ICD-10-CM | POA: Diagnosis present

## 2016-07-26 DIAGNOSIS — Z8781 Personal history of (healed) traumatic fracture: Secondary | ICD-10-CM | POA: Diagnosis not present

## 2016-07-26 DIAGNOSIS — G252 Other specified forms of tremor: Secondary | ICD-10-CM | POA: Diagnosis present

## 2016-07-26 DIAGNOSIS — D751 Secondary polycythemia: Secondary | ICD-10-CM | POA: Diagnosis not present

## 2016-07-26 DIAGNOSIS — R4701 Aphasia: Secondary | ICD-10-CM | POA: Diagnosis present

## 2016-07-26 DIAGNOSIS — R4182 Altered mental status, unspecified: Secondary | ICD-10-CM | POA: Diagnosis not present

## 2016-07-26 DIAGNOSIS — R278 Other lack of coordination: Secondary | ICD-10-CM | POA: Diagnosis not present

## 2016-07-26 DIAGNOSIS — R410 Disorientation, unspecified: Secondary | ICD-10-CM

## 2016-07-26 DIAGNOSIS — D72829 Elevated white blood cell count, unspecified: Secondary | ICD-10-CM | POA: Diagnosis not present

## 2016-07-26 DIAGNOSIS — I350 Nonrheumatic aortic (valve) stenosis: Secondary | ICD-10-CM

## 2016-07-26 DIAGNOSIS — R41841 Cognitive communication deficit: Secondary | ICD-10-CM | POA: Diagnosis not present

## 2016-07-26 DIAGNOSIS — R634 Abnormal weight loss: Secondary | ICD-10-CM | POA: Diagnosis not present

## 2016-07-26 DIAGNOSIS — Z9889 Other specified postprocedural states: Secondary | ICD-10-CM | POA: Diagnosis not present

## 2016-07-26 DIAGNOSIS — J310 Chronic rhinitis: Secondary | ICD-10-CM | POA: Diagnosis present

## 2016-07-26 DIAGNOSIS — I776 Arteritis, unspecified: Secondary | ICD-10-CM | POA: Diagnosis present

## 2016-07-26 DIAGNOSIS — R509 Fever, unspecified: Secondary | ICD-10-CM | POA: Diagnosis not present

## 2016-07-26 DIAGNOSIS — K589 Irritable bowel syndrome without diarrhea: Secondary | ICD-10-CM | POA: Diagnosis present

## 2016-07-26 DIAGNOSIS — M1990 Unspecified osteoarthritis, unspecified site: Secondary | ICD-10-CM | POA: Diagnosis present

## 2016-07-26 DIAGNOSIS — Z823 Family history of stroke: Secondary | ICD-10-CM | POA: Diagnosis not present

## 2016-07-26 DIAGNOSIS — R1319 Other dysphagia: Secondary | ICD-10-CM | POA: Diagnosis not present

## 2016-07-26 DIAGNOSIS — I674 Hypertensive encephalopathy: Secondary | ICD-10-CM | POA: Diagnosis present

## 2016-07-26 DIAGNOSIS — G40909 Epilepsy, unspecified, not intractable, without status epilepticus: Secondary | ICD-10-CM | POA: Diagnosis present

## 2016-07-26 DIAGNOSIS — Z681 Body mass index (BMI) 19 or less, adult: Secondary | ICD-10-CM | POA: Diagnosis not present

## 2016-07-26 DIAGNOSIS — R2681 Unsteadiness on feet: Secondary | ICD-10-CM | POA: Diagnosis present

## 2016-07-26 DIAGNOSIS — Z87891 Personal history of nicotine dependence: Secondary | ICD-10-CM | POA: Diagnosis not present

## 2016-07-26 DIAGNOSIS — I10 Essential (primary) hypertension: Secondary | ICD-10-CM | POA: Diagnosis not present

## 2016-07-26 LAB — GLUCOSE, CAPILLARY: GLUCOSE-CAPILLARY: 95 mg/dL (ref 65–99)

## 2016-07-26 LAB — CBC
HCT: 42.7 % (ref 36.0–46.0)
Hemoglobin: 13.3 g/dL (ref 12.0–15.0)
MCH: 19.4 pg — ABNORMAL LOW (ref 26.0–34.0)
MCHC: 31.1 g/dL (ref 30.0–36.0)
MCV: 62.2 fL — ABNORMAL LOW (ref 78.0–100.0)
PLATELETS: 304 10*3/uL (ref 150–400)
RBC: 6.86 MIL/uL — ABNORMAL HIGH (ref 3.87–5.11)
RDW: 20 % — AB (ref 11.5–15.5)
WBC: 18.6 10*3/uL — ABNORMAL HIGH (ref 4.0–10.5)

## 2016-07-26 LAB — BASIC METABOLIC PANEL
Anion gap: 9 (ref 5–15)
BUN: 22 mg/dL — ABNORMAL HIGH (ref 6–20)
CALCIUM: 8.6 mg/dL — AB (ref 8.9–10.3)
CO2: 22 mmol/L (ref 22–32)
Chloride: 101 mmol/L (ref 101–111)
Creatinine, Ser: 1.07 mg/dL — ABNORMAL HIGH (ref 0.44–1.00)
GFR, EST AFRICAN AMERICAN: 52 mL/min — AB (ref >60)
GFR, EST NON AFRICAN AMERICAN: 45 mL/min — AB (ref >60)
Glucose, Bld: 113 mg/dL — ABNORMAL HIGH (ref 65–99)
Potassium: 3.6 mmol/L (ref 3.5–5.1)
Sodium: 132 mmol/L — ABNORMAL LOW (ref 135–145)

## 2016-07-26 LAB — PROTEIN AND GLUCOSE, CSF
GLUCOSE CSF: 76 mg/dL — AB (ref 40–70)
Total  Protein, CSF: 71 mg/dL — ABNORMAL HIGH (ref 15–45)

## 2016-07-26 LAB — CRYPTOCOCCAL ANTIGEN, CSF: Crypto Ag: NEGATIVE

## 2016-07-26 LAB — CSF CELL COUNT WITH DIFFERENTIAL
RBC Count, CSF: 3 /mm3 — ABNORMAL HIGH
Tube #: 4
WBC CSF: 0 /mm3 (ref 0–5)

## 2016-07-26 LAB — URINALYSIS, ROUTINE W REFLEX MICROSCOPIC
Bilirubin Urine: NEGATIVE
GLUCOSE, UA: NEGATIVE mg/dL
HGB URINE DIPSTICK: NEGATIVE
Ketones, ur: 5 mg/dL — AB
Leukocytes, UA: NEGATIVE
Nitrite: NEGATIVE
PH: 7 (ref 5.0–8.0)
Protein, ur: NEGATIVE mg/dL
SPECIFIC GRAVITY, URINE: 1.01 (ref 1.005–1.030)

## 2016-07-26 LAB — ECHOCARDIOGRAM COMPLETE
HEIGHTINCHES: 62 in
WEIGHTICAEL: 1449.6 [oz_av]

## 2016-07-26 LAB — T3, FREE: T3 FREE: 2.2 pg/mL (ref 2.0–4.4)

## 2016-07-26 LAB — MRSA PCR SCREENING: MRSA by PCR: NEGATIVE

## 2016-07-26 LAB — MAGNESIUM: MAGNESIUM: 1.8 mg/dL (ref 1.7–2.4)

## 2016-07-26 MED ORDER — DEXTROSE 5 % IV SOLN
410.0000 mg | Freq: Three times a day (TID) | INTRAVENOUS | Status: DC
  Administered 2016-07-26: 410 mg via INTRAVENOUS
  Filled 2016-07-26 (×2): qty 8.2

## 2016-07-26 MED ORDER — SODIUM CHLORIDE 0.9 % IV SOLN
2.0000 g | Freq: Four times a day (QID) | INTRAVENOUS | Status: DC
  Administered 2016-07-26 – 2016-07-27 (×7): 2 g via INTRAVENOUS
  Filled 2016-07-26 (×8): qty 2000

## 2016-07-26 MED ORDER — VANCOMYCIN HCL 500 MG IV SOLR
500.0000 mg | INTRAVENOUS | Status: DC
  Administered 2016-07-27 – 2016-07-28 (×2): 500 mg via INTRAVENOUS
  Filled 2016-07-26 (×2): qty 500

## 2016-07-26 MED ORDER — DEXTROSE 5 % IV SOLN
2.0000 g | Freq: Two times a day (BID) | INTRAVENOUS | Status: DC
  Administered 2016-07-26 – 2016-07-28 (×6): 2 g via INTRAVENOUS
  Filled 2016-07-26 (×6): qty 2

## 2016-07-26 MED ORDER — VANCOMYCIN HCL IN DEXTROSE 750-5 MG/150ML-% IV SOLN
750.0000 mg | Freq: Once | INTRAVENOUS | Status: AC
  Administered 2016-07-26: 750 mg via INTRAVENOUS
  Filled 2016-07-26: qty 150

## 2016-07-26 MED ORDER — DEXTROSE 5 % IV SOLN
410.0000 mg | INTRAVENOUS | Status: DC
  Administered 2016-07-27: 410 mg via INTRAVENOUS
  Filled 2016-07-26: qty 8.2

## 2016-07-26 MED ORDER — LEVETIRACETAM 500 MG/5ML IV SOLN
500.0000 mg | Freq: Two times a day (BID) | INTRAVENOUS | Status: DC
  Administered 2016-07-26 – 2016-07-28 (×6): 500 mg via INTRAVENOUS
  Filled 2016-07-26 (×7): qty 5

## 2016-07-26 NOTE — Clinical Social Work Note (Signed)
Clinical Social Work Assessment  Patient Details  Name: [name] MRN: [id number] Date of Birth: 1926/07/09  Date of referral:  07/26/16               Reason for consult:  Discharge Planning                Permission sought to share information with:  Case Manager Permission granted to share information::  Yes, Verbal Permission Granted  Name::        Agency::     Relationship::     Contact Information:     Housing/Transportation Living arrangements for the past 2 months:  Single Family Home Source of Information:  Adult Children Patient Interpreter Needed:  None Criminal Activity/Legal Involvement Pertinent to Current Situation/Hospitalization:  No - Comment as needed Significant Relationships:  Adult Children Lives with:  Self Do you feel safe going back to the place where you live?   (SNF recommended.) Need for family participation in patient care:  Yes (Comment)  Care giving concerns:  Pt may require more assistance than available at home.   Social Worker assessment / plan:  Pt hospitalized from home, on 07/24/16 under observation status, with Hypertensive urgency. Hospital status was changed to " in pt " today . PT has recommended SNF at d/c. CSW met with family at bedside to assist with d/c planning. Pt was sleeping soundly during CSW visit. Family agree with recommendations for SNF at d/c. Pt continues to require SDU level of care. CSW will initiate SNF search and provide bed offers to pt / family closer to time pt is ready to transfer from SDU. Family is aware and in agreement with this plan.     Employment status:  Retired Forensic scientist:  Medicare PT Recommendations:  [place] / Referral to community resources:  [place]  Patient/Family's Response to care:  Family feels SNF placement will be needed at d/c.  Patient/Family's Understanding of and Emotional Response to Diagnosis, Current Treatment, and Prognosis:   Family reports that they have met with MD for medical update but is still waiting on test results. Family appreciates CSW assistance with d/c planning. Emotional Assessment Appearance:  Appears stated age Attitude/Demeanor/Rapport:    Affect (typically observed):  Unable to Assess Orientation:  Oriented to Self Alcohol / Substance use:  Not Applicable Psych involvement (Current and /or in the community):  No (Comment)  Discharge Needs  Concerns to be addressed:  Discharge Planning Concerns Readmission within the last 30 days:  No Current discharge risk:  None Barriers to Discharge:  No Barriers Identified   Luretha Rued, [place] 07/26/2016, 4:14 PM

## 2016-07-26 NOTE — Evaluation (Signed)
Physical Therapy Evaluation Patient Details Name: [name] MRN: [id number] DOB: 11-05-1926 Today's Date: 07/26/2016   History of Present Illness  83 y.o. female with medical history significant of osteoarthritis, atrophic gastritis, breast cancer, history of candidal esophagitis, diverticulosis, hypertension, IBS, mitral valve prolapse, polycythemia, PVCs, rhinitis, unsteady gait who was brought to the emergency department due to trouble finding words and and questionable confusion. CT scan of head was negative for acute finding. She was found to have hypertensive urgency. MRI: right subfrontal extra-axial mass with out edema, likely incidental meningioma. Work up underway.     Clinical Impression  On eval, pt required Max assist +2 for mobility. She was able to sit EOB for a few minutes with Min assist. She stood briefly at bedside. She was lethargic during session. Multimodal cueing required for participation. Family present during session. Will continue to follow and progress activity as able. Recommend SNF at this time-will update recs as necessary.     Follow Up Recommendations SNF    Equipment Recommendations   (continuing to assess)    Recommendations for Other Services       Precautions / Restrictions Precautions Precautions: Fall Restrictions Weight Bearing Restrictions: No      Mobility  Bed Mobility Overal bed mobility: Needs Assistance Bed Mobility: Rolling;Supine to Sit;Sit to Supine Rolling: Min assist   Supine to sit: Max assist;+2 for physical assistance;+2 for safety/equipment Sit to supine: Mod assist   General bed mobility comments: Assist for trunk and LEs. Utilized bedpad. Multimodal cueing to encourage participation.   Transfers Overall transfer level: Needs assistance Equipment used: 2 person hand held assist Transfers: Sit to/from Stand Sit to Stand: Mod assist;+2 physical assistance;+2 safety/equipment         General transfer comment: Pt  did stand with significant posterior lean. Used HHA +2. No knee buckling noted  Ambulation/Gait                Stairs            Wheelchair Mobility    Modified Rankin (Stroke Patients Only)       Balance Overall balance assessment: Needs assistance Sitting-balance support: Feet supported Sitting balance-Leahy Scale: Poor Sitting balance - Comments: Able to maintain midline orientation. Kyphotic; balance appeared to be affected by lethargy Postural control: Posterior lean   Standing balance-Leahy Scale: Poor                               Pertinent Vitals/Pain Pain Assessment: Faces Faces Pain Scale: Hurts a little bit Pain Location: general discomfort Pain Descriptors / Indicators: Discomfort Pain Intervention(s): Limited activity within patient's tolerance    Home Living Family/patient expects to be discharged to:: Private residence Living Arrangements: Alone Available Help at Discharge: Family;Available PRN/intermittently Type of Home: House Home Access: Stairs to enter   Entrance Stairs-Number of Steps: 2 Home Layout: Two level;Able to live on main level with bedroom/bathroom Home Equipment: Gilford Rile - 2 wheels;Cane - single point      Prior Function Level of Independence: Independent with assistive device(s)         Comments: uses cane (drives)     Hand Dominance   Dominant Hand: Right    Extremity/Trunk Assessment   Upper Extremity Assessment Upper Extremity Assessment: Defer to OT evaluation    Lower Extremity Assessment Lower Extremity Assessment: Difficult to assess due to impaired cognition    Cervical / Trunk Assessment Cervical / Trunk Assessment:  Kyphotic  Communication   Communication: Other (comment) (difficult to assess due to lethargy)  Cognition Arousal/Alertness: Lethargic Behavior During Therapy: Flat affect Overall Cognitive Status: Difficult to assess Area of Impairment: Orientation;Attention;Following  commands;Safety/judgement;Memory;Awareness;Problem solving                   Current Attention Level: Focused   Following Commands: Follows one step commands inconsistently     Problem Solving: Requires tactile cues;Requires verbal cues;Decreased initiation;Slow processing General Comments: Pt with eyes closed majority of session. Followed command to "show me your arm band". "Lie back down". No response when asked her name, birthdate. Pt did respond "no" when asked if she wanted to stand up.       General Comments      Exercises     Assessment/Plan    PT Assessment Patient needs continued PT services  PT Problem List Decreased strength;Decreased mobility;Decreased activity tolerance;Decreased balance;Decreased knowledge of use of DME;Decreased cognition       PT Treatment Interventions Gait training;Therapeutic activities;DME instruction;Therapeutic exercise;Patient/family education;Balance training;Functional mobility training    PT Goals (Current goals can be found in the Care Plan section)  Acute Rehab PT Goals Patient Stated Goal: none stated; per family to get better PT Goal Formulation: Patient unable to participate in goal setting Time For Goal Achievement: 08/09/16 Potential to Achieve Goals: Good    Frequency Min 3X/week   Barriers to discharge        Co-evaluation PT/OT/SLP Co-Evaluation/Treatment: Yes Reason for Co-Treatment: For patient/therapist safety;Complexity of the patient's impairments (multi-system involvement) PT goals addressed during session: Mobility/safety with mobility OT goals addressed during session: ADL's and self-care       AM-PAC PT "6 Clicks" Daily Activity  Outcome Measure Difficulty turning over in bed (including adjusting bedclothes, sheets and blankets)?: Total Difficulty moving from lying on back to sitting on the side of the bed? : Total Difficulty sitting down on and standing up from a chair with arms (e.g.,  wheelchair, bedside commode, etc,.)?: Total Help needed moving to and from a bed to chair (including a wheelchair)?: A Lot Help needed walking in hospital room?: A Lot Help needed climbing 3-5 steps with a railing? : A Lot 6 Click Score: 9    End of Session   Activity Tolerance: Patient limited by lethargy Patient left: in bed;with call bell/phone within reach;with family/visitor present (with EEG tech)   PT Visit Diagnosis: Muscle weakness (generalized) (M62.81);Difficulty in walking, not elsewhere classified (R26.2)    Time: 9678-9381 PT Time Calculation (min) (ACUTE ONLY): 20 min   Charges:   PT Evaluation $PT Eval Moderate Complexity: 1 Procedure     PT G Codes:         Weston Anna, MPT Pager: [phone]

## 2016-07-26 NOTE — Procedures (Signed)
ELECTROENCEPHALOGRAM REPORT  Date of Study: 07/26/2016  Patient's Name: SAVI LASTINGER MRN: [id number] Date of Birth: 1926/09/10  Referring Provider: Dr. Gean Birchwood  Clinical History: This is an 83 year old woman with altered mental status.  Medications: levETIRAcetam (KEPPRA) 500 mg in sodium chloride 0.9 % 100 mL IVPB  acetaminophen (TYLENOL) tablet 650 mg  amLODipine (NORVASC) tablet 5 mg  ampicillin (OMNIPEN) 2 g in sodium chloride 0.9 % 50 mL IVPB  anagrelide (AGRYLIN) capsule 2 mg  aspirin EC tablet 81 mg  calcium-vitamin D (OSCAL WITH D) 500-200 MG-UNIT per tablet 2 tablet  cefTRIAXone (ROCEPHIN) 2 g in dextrose 5 % 50 mL IVPB  cloNIDine (CATAPRES) tablet 0.1 mg  colestipol (COLESTID) tablet 1 g  feeding supplement (ENSURE ENLIVE) (ENSURE ENLIVE) liquid 237 mL  fluticasone (FLONASE) 50 MCG/ACT nasal spray 2 spray  heparin injection 5,000 Units  hydrALAZINE (APRESOLINE) injection 10 mg  hydrochlorothiazide (MICROZIDE) capsule 12.5 mg  lisinopril (PRINIVIL,ZESTRIL) tablet 20 mg  loperamide (IMODIUM) capsule 2 mg  metoprolol succinate (TOPROL-XL) 24 hr tablet 50 mg  pantoprazole (PROTONIX) EC tablet 40 mg  potassium chloride (K-DUR,KLOR-CON) CR tablet 10 mEq  vancomycin (VANCOCIN) 500 mg in sodium chloride 0.9 % 100 mL IVPB   Technical Summary: A multichannel digital EEG recording measured by the international 10-20 system with electrodes applied with paste and impedances below 5000 ohms performed as portable with EKG monitoring in an awake and drowsy patient.  Hyperventilation and photic stimulation were not performed.  The digital EEG was referentially recorded, reformatted, and digitally filtered in a variety of bipolar and referential montages for optimal display.   Description: The patient is awake and drowsy during the recording.  During maximal wakefulness, there is a asymmetric, medium voltage 8 Hz posterior dominant rhythm better formed over the right  occipital region. This is admixed with a moderate amount of diffuse 4-5 Hz theta and 2-3 Hz delta slowing of the waking background, with additional occasional focal 4-5 Hz slowing over the left hemisphere. During drowsiness, there is an increase in theta and delta slowing of the background. Normal sleep architecture was not seen. Hyperventilation and photic stimulation were not performed. There was significant muscle artifact over the bilateral temporal regions throughout the recording, in between artifact, there were no epileptiform discharges or electrographic seizures seen.    EKG lead showed sinus tachycardia.  Impression: This awake and drowsy EEG is abnormal due to the presence of: 1. Moderate diffuse slowing of the waking background 2. Occasional focal slowing over the left hemisphere  Clinical Correlation of the above findings indicates diffuse cerebral dysfunction that is non-specific in etiology and can be seen with hypoxic/ischemic injury, toxic/metabolic encephalopathies, neurodegenerative disorders, or medication effect. Focal slowing over the left hemisphere indicates focal cerebral dysfunction in this region suggestive of underlying structural or physiologic abnormality. The study is limited due to significant muscle artifact over the bilateral temporal regions, there were no epileptiform discharges seen in between artifact.  The absence of epileptiform discharges does not rule out a clinical diagnosis of epilepsy.  Clinical correlation is advised.   Ellouise Newer, M.D.

## 2016-07-26 NOTE — Plan of Care (Signed)
Last night around 7:30 PM on 07/25/2016, patient's nurse notified me about patient being confused and having jerking movements of the left upper extremity. On examination at bedside patient was not initially responding to her name but was awake. I reviewed patient's labs and MRI results. Examined the patient. Discussed with neurologist on call Dr. Cheral Marker. Initial concern was for possible seizures. Dr. Cheral Marker advised to give Ativan 1 mg IV followed by Keppra 1000 mg and 500 mg twice a day. And Dr. Cheral Marker and will be seeing patient in consultation. Subsequent to which patient started developing fever for which I have ordered blood cultures chest x-ray and UA.  [name].

## 2016-07-26 NOTE — Progress Notes (Addendum)
Pharmacy Antibiotic Note  [name] is a 83 y.o. female admitted on 07/24/2016 with meningitis.  Pharmacy has been consulted for Vancomycin, ampicillin, acyclovir, ceftriaxone dosing.  Plan: Vancomycin 500mg  (after 750mg  x1 load) IV every 24 hours.  Goal trough 15-20 mcg/mL.  Ampicillin 2gm iv q6hr (dose reduced from Q4hr due to reduced renal function) Ceftriaxone 2gm iv q12hr Acyclovir 410mg  iv q8hr (10mg /kg)  Height: 5\' 2"  (157.5 cm) Weight: 90 lb 9.6 oz (41.1 kg) IBW/kg (Calculated) : 50.1  Temp (24hrs), Avg:100 F (37.8 C), Min:98.4 F (36.9 C), Max:102 F (38.9 C)   Recent Labs Lab 07/24/16 2027 07/25/16 0505  WBC 12.5* 14.9*  CREATININE 1.09* 1.13*    Estimated Creatinine Clearance: 22.3 mL/min (A) (by C-G formula based on SCr of 1.13 mg/dL (H)).    Allergies  Allergen Reactions  . Sulfur Other (See Comments)    Pt doesn't remember reaction.    Antimicrobials this admission: Vancomycin 07/26/2016 >> Ceftriaxone 07/26/2016 >>  Acyclovir 07/26/2016 >> Ampicillin 07/26/2016 >>  Dose adjustments this admission: -  Microbiology results: pending  Thank you for allowing pharmacy to be a part of this patient's care.  Nani Skillern [place] 07/26/2016 1:54 AM   _________________  Adden: scr 1.07 (crcl~23).  - reduce acyclovir to 410 mg Iv q24h -cont ceftriaxone 2 gm IV q12h, ampicillin 2gm IV q6h, vancomycin 500 mg IV q24h  Dia Sitter, PharmD, BCPS 07/26/2016 8:54 AM

## 2016-07-26 NOTE — Progress Notes (Signed)
PROGRESS NOTE    [name]  KVQ:[id number] DOB: May 05, 1926 DOA: 07/24/2016 PCP: Deland Pretty, MD   Brief Narrative: 83 year old female with history of osteoarthritis, history of breast cancer, diverticulosis, hypertension, mitral valve prolapse, polycythemia follows up with oncologist, presented to the hospital on 6/24 with trouble finding words and possible confusion. In the ER patient was found to have blood pressure of 212/99 with chronic leukocytosis. CT scan of the head unremarkable. MRI of the brain showed no acute distress. In the evening patient become febrile and worsening mental status. LP was done and he started on empiric antibiotics for possible meningoencephalitis. Patient was moved to the stepdown unit.  Assessment & Plan:  # Altered mental status/word finding difficulty/ acute encephalopathy likely due to meningoencephalitis versus seizure disorder. The differentials include toxic/metabolic etiology. -Status post lumbar puncture, has no white cell counts, follow up final results. Currently on empiric antibiotics including ampicillin, ceftriaxone, vancomycin and acyclovir. Follow-up with neurologist plan of care. The consult appreciated. -Patient was also loaded with Keppra and he started on 500 mg twice a day. EEG with no epileptic foci. Continue seizure precaution. -Patient was alert awake and following commands this morning  However, she was lethargic and sleepy. -MRI with advanced atrophy and chronic microvascular ischemic changes. There is also right subfrontal extra-axial mass without edema likely meningioma. Given change in mental status, I consulted neurosurgery for the evaluation.  -monitor fever curve, leukocytosis.  -Vitamin B12 and ammonia level acceptable.  # Hypertensive urgency on admission:  -continue current oral medication. The dose of norvasc increased to 5 mg. Monitor BP.   # Polycythemia vera:  -gets outpatient schedule phlebotomies. Monitor H/H  #  Mild hyponatremia: Continue normal saline. Monitor BMP.  #TSH level elevated. Free T4 normal. Follow-up free T3 level. Need to follow up with PCP.   Principal Problem:   Hypertensive urgency Active Problems:   Polycythemia vera (HCC)   Leukocytosis   Thrombocytosis (HCC)   Acute encephalopathy   Protein-calorie malnutrition, severe  DVT prophylaxis: Heparin subcutaneous Code Status: Full code Family Communication: Discussed with the patient's son and daughter at bedside Disposition Plan: Currently in the stepdown    Consultants:   Neurology  Neurosurgery  Procedures: Lumbar puncture Antimicrobials: Acyclovir, ampicillin, ceftriaxone and vancomycin since June 25.  Subjective: Seen and examined at bedside in the stepdown. Patient was alert awake but somnolent. Denied headache. Feels tired. Son and daughter at bedside. Denied headache, chest pain, shortness of breath.  Objective: Vitals:   07/26/16 0900 07/26/16 1000 07/26/16 1100 07/26/16 1150  BP: 132/67 134/61 (!) 167/84   Pulse: 93 84 98   Resp:      Temp:    98.8 F (37.1 C)  TempSrc:    Axillary  SpO2: 96% 96% 96%   Weight:      Height:        Intake/Output Summary (Last 24 hours) at 07/26/16 1255 Last data filed at 07/26/16 0600  Gross per 24 hour  Intake           884.45 ml  Output              350 ml  Net           534.45 ml   Filed Weights   07/24/16 2023 07/25/16 0047  Weight: 40.8 kg (90 lb) 41.1 kg (90 lb 9.6 oz)    Examination:  General exam: Lying on bed comfortable, not in distress, mild neck rigidity Respiratory system: Clear to auscultation. Respiratory  effort normal. No wheezing or crackle Cardiovascular system: S1 & S2 heard, RRR.  No pedal edema. Gastrointestinal system: Abdomen is nondistended, soft and nontender. Normal bowel sounds heard. Central nervous system: Alert awake and following commands Extremities: Symmetric 5 x 5 power. Skin: No rashes, lesions or ulcers    Data  Reviewed: I have personally reviewed following labs and imaging studies  CBC:  Recent Labs Lab 07/24/16 2027 07/25/16 0505 07/26/16 0815  WBC 12.5* 14.9* 18.6*  NEUTROABS  --  13.3*  --   HGB 14.1 12.0 13.3  HCT 45.1 39.9 42.7  MCV 63.3* 63.3* 62.2*  PLT 303 306 443   Basic Metabolic Panel:  Recent Labs Lab 07/24/16 2027 07/25/16 0505 07/26/16 0815  NA 137 135 132*  K 4.6 4.3 3.6  CL 107 103 101  CO2 24 24 22   GLUCOSE 123* 133* 113*  BUN 26* 27* 22*  CREATININE 1.09* 1.13* 1.07*  CALCIUM 9.3 8.8* 8.6*  MG  --  2.1  --   PHOS  --  2.8  --    GFR: Estimated Creatinine Clearance: 23.6 mL/min (A) (by C-G formula based on SCr of 1.07 mg/dL (H)). Liver Function Tests:  Recent Labs Lab 07/24/16 2027  AST 17  ALT 10*  ALKPHOS 71  BILITOT 0.5  PROT 6.7  ALBUMIN 4.0   No results for input(s): LIPASE, AMYLASE in the last 168 hours.  Recent Labs Lab 07/25/16 1134  AMMONIA 15   Coagulation Profile: No results for input(s): INR, PROTIME in the last 168 hours. Cardiac Enzymes: No results for input(s): CKTOTAL, CKMB, CKMBINDEX, TROPONINI in the last 168 hours. BNP (last 3 results) No results for input(s): PROBNP in the last 8760 hours. HbA1C: No results for input(s): HGBA1C in the last 72 hours. CBG:  Recent Labs Lab 07/24/16 2104 07/25/16 1934  GLUCAP 110* 122*   Lipid Profile:  Recent Labs  07/25/16 0600  CHOL 131  HDL 46  LDLCALC 65  TRIG 101  CHOLHDL 2.8   Thyroid Function Tests:  Recent Labs  07/25/16 1134 07/25/16 1533  TSH 5.593*  --   FREET4  --  1.12   Anemia Panel:  Recent Labs  07/25/16 1134  VITAMINB12 692   Sepsis Labs: No results for input(s): PROCALCITON, LATICACIDVEN in the last 168 hours.  Recent Results (from the past 240 hour(s))  MRSA PCR Screening     Status: None   Collection Time: 07/25/16 10:45 PM  Result Value Ref Range Status   MRSA by PCR NEGATIVE NEGATIVE Final    Comment:        The GeneXpert MRSA  Assay (FDA approved for NASAL specimens only), is one component of a comprehensive MRSA colonization surveillance program. It is not intended to diagnose MRSA infection nor to guide or monitor treatment for MRSA infections.   CSF culture     Status: None (Preliminary result)   Collection Time: 07/26/16  2:18 AM  Result Value Ref Range Status   Specimen Description CSF  Final   Special Requests Normal  Final   Gram Stain   Final    NO ORGANISMS SEEN RARE WBC SEEN Gram Stain Report Called to,Read Back By and Verified With: HUI AT 0339 ON 06.25.2018 BY NBROOKS    Culture PENDING  Incomplete   Report Status PENDING  Incomplete         Radiology Studies: Ct Head Wo Contrast  Result Date: 07/24/2016 CLINICAL DATA:  Difficulty speaking, hypertensive, confusion. History of polycythemia.  EXAM: CT HEAD WITHOUT CONTRAST TECHNIQUE: Contiguous axial images were obtained from the base of the skull through the vertex without intravenous contrast. COMPARISON:  None. FINDINGS: Brain: Mild generalized age related parenchymal atrophy with commensurate dilatation of the ventricles and sulci. Chronic small vessel ischemic changes within the bilateral periventricular and subcortical white matter regions. There is no mass, hemorrhage, edema or other evidence of acute parenchymal abnormality. No extra-axial hemorrhage. Incidental note made of a probable calcified meningioma within the left temporal fossa. Vascular: There are chronic calcified atherosclerotic changes of the large vessels at the skull base. No unexpected hyperdense vessel. Skull: Normal. Negative for fracture or focal lesion. Sinuses/Orbits: No acute finding. Other: None. IMPRESSION: 1. No acute intracranial abnormality. No intracranial hemorrhage or edema. 2. Atrophy and chronic ischemic changes in the white matter. Electronically Signed   By: Franki Cabot M.D.   On: 07/24/2016 22:42   Mr Brain Wo Contrast  Result Date:  07/25/2016 CLINICAL DATA:  Confusion, difficulty speaking, high blood pressure. Symptoms began yesterday. History of polycythemia and thrombocytosis. EXAM: MRI HEAD WITHOUT CONTRAST TECHNIQUE: Multiplanar, multiecho pulse sequences of the brain and surrounding structures were obtained without intravenous contrast. COMPARISON:  CT head 07/24/2016. FINDINGS: Brain: No evidence for acute infarction, hemorrhage, or extra-axial fluid. Generalized atrophy. Hydrocephalus ex vacuo. Chronic microvascular ischemic change. Along the cribriform plate, RIGHT paramedian location, an extra-axial mass measuring 10 x 14 x 9 mm is present, displacing the gyrus rectus without edema, most consistent with a meningioma. Vascular: Normal flow voids. Skull and upper cervical spine: There is mild expansion of the clivus, and low signal T1 intensity bone marrow in the clivus and visualized upper cervical region. This is likely hematologic in origin. Sinuses/Orbits: Negative. Other: None. IMPRESSION: Advanced atrophy. Chronic microvascular ischemic change. No acute stroke. 10 x 14 x 9 mm RIGHT subfrontal extra-axial mass, without edema, likely incidental meningioma in this elderly female. Diffuse low T1 signal intensity bone marrow, likely related to polycythemia and thrombocytosis. Electronically Signed   By: Staci Righter M.D.   On: 07/25/2016 17:48   Dg Chest Port 1 View  Result Date: 07/26/2016 CLINICAL DATA:  Fever and tremors tonight. EXAM: PORTABLE CHEST 1 VIEW COMPARISON:  05/08/2015 FINDINGS: A single AP portable view of the chest demonstrates no focal airspace consolidation or alveolar edema. The lungs are grossly clear. There is no large effusion or pneumothorax. Cardiac and mediastinal contours appear unremarkable. IMPRESSION: No active disease. Electronically Signed   By: Andreas Newport M.D.   On: 07/26/2016 00:30        Scheduled Meds: . amLODipine  5 mg Oral Daily  . anagrelide  2 mg Oral BID  . aspirin EC  81  mg Oral Daily  . calcium-vitamin D  2 tablet Oral Q breakfast  . cloNIDine  0.1 mg Oral Once  . colestipol  1 g Oral Daily  . feeding supplement (ENSURE ENLIVE)  237 mL Oral BID BM  . heparin  5,000 Units Subcutaneous Q8H  . hydrochlorothiazide  12.5 mg Oral Daily  . lisinopril  20 mg Oral Daily  . metoprolol succinate  50 mg Oral Daily  . potassium chloride  10 mEq Oral Daily   Continuous Infusions: . sodium chloride 75 mL/hr at 07/25/16 2339  . [START ON 07/27/2016] acyclovir    . ampicillin (OMNIPEN) IV 2 g (07/26/16 1209)  . cefTRIAXone (ROCEPHIN)  IV Stopped (07/26/16 1052)  . levETIRAcetam Stopped (07/26/16 0827)  . [START ON 07/27/2016] vancomycin  LOS: 0 days    [name] Tanna Furry, MD [facility] Pager [phone]  If 7PM-7AM, please contact night-coverage [email or web address] Password [facility] 07/26/2016, 12:55 PM

## 2016-07-26 NOTE — Progress Notes (Signed)
IV Ativan 1mg  wasted, witnessed by [employer] H.

## 2016-07-26 NOTE — Progress Notes (Signed)
Incidentally discovered all factory groove meningioma. No evidence of significant mass effect or compression of optic apparatus. No indication for treatment. Follow-up imaging only if new symptoms appear.

## 2016-07-26 NOTE — Progress Notes (Signed)
[place] EEG completed at [facility]; results pending.

## 2016-07-26 NOTE — Progress Notes (Signed)
2D echocardiogram complete

## 2016-07-26 NOTE — Progress Notes (Signed)
Occupational Therapy Evaluation Patient Details Name: [name] MRN: [id number] DOB: 06-18-1926 Today's Date: 07/26/2016    History of Present Illness 83 y.o. female with medical history significant of osteoarthritis, atrophic gastritis, breast cancer, history of candidal esophagitis, diverticulosis, hypertension, IBS, mitral valve prolapse, polycythemia, PVCs, rhinitis, unsteady gait who was brought to the emergency department due to trouble finding words and and questionable confusion. CT scan of head was negative for acute finding. She was found to have hypertensive urgency. MRI: right subfrontal extra-axial mass with out edema, likely incidental meningioma. Work up underway.    Clinical Impression   PTA, pt lived alone and was modified independent with mobility and ADL @ cane level. Pt drove and completed IADL tasks independently. Pt inconsistently following 1 step commands. Lethargic with eyes closed majority of session. Increased response to familiar voices of family members. Shaking head yes/no appropriately at times. Increased alertness for short period (@ 1 min) when sitting EOB. Able to stand with +2 mod A with posterior lean. HR 101; BP 147/72 sitting EOB. Pt demonstrates significant functional decline and will benefit from rehab at SNF to facilitate return to PLOF. Will follow acutely to address established goals and facilitate safe DC to next venue of care.     Follow Up Recommendations  SNF;Supervision/Assistance - 24 hour    Equipment Recommendations  Other (comment) (will further assess)    Recommendations for Other Services Speech consult     Precautions / Restrictions Precautions Precautions: Fall Restrictions Weight Bearing Restrictions: No      Mobility Bed Mobility Overal bed mobility: Needs Assistance Bed Mobility: Supine to Sit;Sit to Supine     Supine to sit: +2 for physical assistance;Max assist Sit to supine: Mod assist   General bed mobility  comments: total A +2 to roll or scoot in bed  Transfers Overall transfer level: Needs assistance   Transfers: Sit to/from Stand Sit to Stand: Mod assist;+2 physical assistance         General transfer comment: Pt did stand with significant posterior lean. Used HHA +2. No knee buckling noted    Balance Overall balance assessment: Needs assistance Sitting-balance support: Feet supported Sitting balance-Leahy Scale: Poor Sitting balance - Comments: Able to maintain midline orientation. Kyphotic; balance appeared to be affected by lethargy Postural control: Posterior lean   Standing balance-Leahy Scale: Poor (posterior lean)                             ADL either performed or assessed with clinical judgement   ADL Overall ADL's : Needs assistance/impaired                                     Functional mobility during ADLs: Moderate assistance;+2 for physical assistance General ADL Comments: total A for all ADL at this time.     Vision         Perception     Praxis      Pertinent Vitals/Pain Pain Assessment: Faces Faces Pain Scale: Hurts a little bit Pain Location: general discomfort Pain Descriptors / Indicators: Discomfort Pain Intervention(s): Limited activity within patient's tolerance     Hand Dominance Right   Extremity/Trunk Assessment Upper Extremity Assessment Upper Extremity Assessment: Generalized weakness   Lower Extremity Assessment Lower Extremity Assessment: Generalized weakness   Cervical / Trunk Assessment Cervical / Trunk Assessment: Kyphotic   Communication Communication  Communication: Other (comment) (difficult to assess due to lethargy)   Cognition Arousal/Alertness: Lethargic Behavior During Therapy: Flat affect Overall Cognitive Status: Difficult to assess Area of Impairment: Orientation;Attention;Following commands;Safety/judgement;Memory;Awareness;Problem solving                   Current  Attention Level: Focused   Following Commands: Follows one step commands inconsistently (with increased time)       General Comments: Pt with eyes closed majority of session. Followed command to "show me your arm band". "Lie back down". No resonse when asked her name, birthdate. Pt did respond "no" when asked if she wanted to stand up.    General Comments       Exercises     Shoulder Instructions      Home Living Family/patient expects to be discharged to:: Private residence Living Arrangements: Alone Available Help at Discharge: Family;Available PRN/intermittently Type of Home: House Home Access: Stairs to enter Entrance Stairs-Number of Steps: 2   Home Layout: Two level;Able to live on main level with bedroom/bathroom     Bathroom Shower/Tub: Tub/shower unit;Curtain   Bathroom Toilet: Standard Bathroom Accessibility: Yes How Accessible: Accessible via walker Home Equipment: [place] - 2 wheels;Cane - single point          Prior Functioning/Environment Level of Independence: Independent with assistive device(s)        Comments: uses cane (drives)        OT Problem List: Decreased strength;Decreased activity tolerance;Impaired balance (sitting and/or standing);Decreased coordination;Decreased cognition;Decreased safety awareness;Decreased knowledge of use of DME or AE      OT Treatment/Interventions: Self-care/ADL training;Therapeutic exercise;Neuromuscular education;DME and/or AE instruction;Therapeutic activities;Cognitive remediation/compensation;Patient/family education;Visual/perceptual remediation/compensation;Balance training    OT Goals(Current goals can be found in the care plan section) Acute Rehab OT Goals Patient Stated Goal: none stated; per family to get better OT Goal Formulation: With family Time For Goal Achievement: 08/09/16 Potential to Achieve Goals: Good ADL Goals Pt Will Perform Grooming: with supervision;sitting Pt Will Perform Upper Body  Bathing: with supervision;with set-up;sitting Pt Will Transfer to Toilet: with min assist;bedside commode;stand pivot transfer Additional ADL Goal #1: Pt will demonstrate emergent awarenss during ADL task in nondistracting environment  OT Frequency: Min 2X/week   Barriers to D/C:            Co-evaluation PT/OT/SLP Co-Evaluation/Treatment: Yes Reason for Co-Treatment: Complexity of the patient's impairments (multi-system involvement);For patient/therapist safety;Necessary to address cognition/behavior during functional activity   OT goals addressed during session: ADL's and self-care      AM-PAC PT "6 Clicks" Daily Activity     Outcome Measure Help from another person eating meals?: Total Help from another person taking care of personal grooming?: Total Help from another person toileting, which includes using toliet, bedpan, or urinal?: Total Help from another person bathing (including washing, rinsing, drying)?: Total Help from another person to put on and taking off regular upper body clothing?: Total Help from another person to put on and taking off regular lower body clothing?: Total 6 Click Score: 6   End of Session Nurse Communication: Mobility status  Activity Tolerance: Patient limited by lethargy Patient left: in bed;with call bell/phone within reach;with family/visitor present;Other (comment) (EEG being set up)  OT Visit Diagnosis: Unsteadiness on feet (R26.81);Muscle weakness (generalized) (M62.81);Other symptoms and signs involving cognitive function                Time: 7209-4709 OT Time Calculation (min): 20 min Charges:  OT General Charges $OT Visit: 1 Procedure OT Evaluation $  OT Eval Moderate Complexity: 1 Procedure G-Codes:     Jamesha Ellsworth, OT/L  [phone] 07/26/2016  Niurka Benecke,HILLARY 07/26/2016, 11:15 AM

## 2016-07-26 NOTE — Progress Notes (Signed)
Lumbar Puncture Procedure Note:  Informed consent was obtained from daughter after discussion of risks/benefits of the procedure. The patient was placed in the seated fetal position, then prepped in sterile fashion. 1% lidocaine applied subcutaneously at L3-4 and L4-5 interspaces. LP needle introduced at the L4-5 interspace after one unsuccessful attempt at L3-4 with clear, colorless CSF obtained in tubes 2-4. Initial tube cloudy due to bloody tap. Needle withdrawn and pressure placed at LP site followed by bandage. No complications. CSF being sent to lab. Orders for CSF labs placed.   Electronically signed: Dr. Kerney Elbe

## 2016-07-26 NOTE — Progress Notes (Signed)
Noted 6 second run of V-Tach notified Dr. Carolin Sicks. New order for Mag level.  Patient also had small amount of urine output, bladder scan completed and showed 960 ml of urine, new order for foley catheter.

## 2016-07-26 NOTE — Progress Notes (Signed)
Initial Nutrition Assessment  DOCUMENTATION CODES:   Severe malnutrition in context of chronic illness, Underweight  INTERVENTION:  - Will monitor for medical course and associated needs at follow-up.  NUTRITION DIAGNOSIS:   Malnutrition (severe) related to chronic illness, poor appetite (osteoarthritis, hx of breast cancer) as evidenced by severe depletion of muscle mass, severe depletion of body fat, per patient/family report.  GOAL:   Patient will meet greater than or equal to 90% of their needs  MONITOR:   PO intake, Weight trends, Labs  REASON FOR ASSESSMENT:   Malnutrition Screening Tool  ASSESSMENT:   83 y.o. female with medical history significant of osteoarthritis, atrophic gastritis, breast cancer, history of candidal esophagitis, diverticulosis, hypertension, IBS, mitral valve prolapse, polycythemia, PVCs, rhinitis, unsteady gait who was brought to the emergency department due to trouble finding words and and questionable confusion while talking to her daughter at around 1830 earlier this evening. The patient is not very sure about having these symptoms. However, she complains about having frontal headache.  Pt seen for MST. BMI indicates underweight status. Pt has been minimally responsive since yesterday and unable to consume anything PO. Reviewed RN and MD notes concerning Rapid Response events and need to transfer to SDU. EEG done this AM and Neurology note following this test reviewed. Pt awoke briefly to name call and shoulder rub but unable to communicate in a meaningful way. All information provided by niece, who is at bedside. She reports pt has never been a big eater and that she always ate very little. Niece is unsure if intakes have further declined recently.   Niece also reports that pt has always been very thin and that pt weighed 96 lbs when she got married 60 years ago. Per chart review, weight with some fluctuation (89-96 lbs) since 07/01/15. Most recently,  she has lost 5 lbs (5% body weight) in the past 3 months; this is not significant for time frame. Physical assessment shows severe muscle and severe fat wasting to upper body, mild/moderate muscle wasting to lower body, and mild edema to BLE.   Medications reviewed; 2 tablets Oscal with D/day, 10 mEq oral KCl/day. Labs reviewed; Na: 132 mmol/L, BUN: 22 mg/dL, creatinine: 1.07 mg/dL, Ca: 8.6 mg/dL, GFR: 45 mL/min.  IVF: NS @ 75 mL/hr.    Diet Order:  Diet Heart Room service appropriate? Yes; Fluid consistency: Thin  Skin:  Reviewed, no issues  Last BM:  6/25  Height:   Ht Readings from Last 1 Encounters:  07/25/16 5\' 2"  (1.575 m)    Weight:   Wt Readings from Last 1 Encounters:  07/25/16 90 lb 9.6 oz (41.1 kg)    Ideal Body Weight:  50 kg  BMI:  Body mass index is 16.57 kg/m.  Estimated Nutritional Needs:   Kcal:  1235-1440 (30-35 kcal/kg)  Protein:  50-60 grams  Fluid:  1.2-1.5 L/day  EDUCATION NEEDS:   No education needs identified at this time    Jarome Matin, MS, RD, LDN, CNSC Inpatient Clinical Dietitian Pager # [phone] After hours/weekend pager # [phone]

## 2016-07-27 ENCOUNTER — Telehealth: Payer: Self-pay | Admitting: *Deleted

## 2016-07-27 DIAGNOSIS — Z800 Family history of malignant neoplasm of digestive organs: Secondary | ICD-10-CM

## 2016-07-27 DIAGNOSIS — Z8249 Family history of ischemic heart disease and other diseases of the circulatory system: Secondary | ICD-10-CM

## 2016-07-27 DIAGNOSIS — G9340 Encephalopathy, unspecified: Secondary | ICD-10-CM

## 2016-07-27 DIAGNOSIS — R634 Abnormal weight loss: Secondary | ICD-10-CM

## 2016-07-27 DIAGNOSIS — Z823 Family history of stroke: Secondary | ICD-10-CM

## 2016-07-27 DIAGNOSIS — Z882 Allergy status to sulfonamides status: Secondary | ICD-10-CM

## 2016-07-27 DIAGNOSIS — Z87891 Personal history of nicotine dependence: Secondary | ICD-10-CM

## 2016-07-27 DIAGNOSIS — Z9889 Other specified postprocedural states: Secondary | ICD-10-CM

## 2016-07-27 DIAGNOSIS — D751 Secondary polycythemia: Secondary | ICD-10-CM

## 2016-07-27 DIAGNOSIS — Z833 Family history of diabetes mellitus: Secondary | ICD-10-CM

## 2016-07-27 DIAGNOSIS — Z8042 Family history of malignant neoplasm of prostate: Secondary | ICD-10-CM

## 2016-07-27 DIAGNOSIS — R2689 Other abnormalities of gait and mobility: Secondary | ICD-10-CM

## 2016-07-27 DIAGNOSIS — I10 Essential (primary) hypertension: Secondary | ICD-10-CM

## 2016-07-27 DIAGNOSIS — K529 Noninfective gastroenteritis and colitis, unspecified: Secondary | ICD-10-CM

## 2016-07-27 DIAGNOSIS — R509 Fever, unspecified: Secondary | ICD-10-CM

## 2016-07-27 DIAGNOSIS — Z8781 Personal history of (healed) traumatic fracture: Secondary | ICD-10-CM

## 2016-07-27 LAB — MAGNESIUM: Magnesium: 1.6 mg/dL — ABNORMAL LOW (ref 1.7–2.4)

## 2016-07-27 LAB — IGG CSF INDEX
ALBUMIN: 3.6 g/dL (ref 3.5–4.7)
Albumin CSF-mCnc: 41 mg/dL (ref 11–48)
IGG (IMMUNOGLOBIN G), SERUM: 651 mg/dL — AB (ref 700–1600)
IGG INDEX CSF: 0.6 (ref 0.0–0.7)
IGG/ALB RATIO, CSF: 0.11 (ref 0.00–0.25)
IgG, CSF: 4.4 mg/dL (ref 0.0–8.6)

## 2016-07-27 LAB — CBC
HEMATOCRIT: 38.9 % (ref 36.0–46.0)
HEMOGLOBIN: 12.3 g/dL (ref 12.0–15.0)
MCH: 19.8 pg — ABNORMAL LOW (ref 26.0–34.0)
MCHC: 31.6 g/dL (ref 30.0–36.0)
MCV: 62.6 fL — ABNORMAL LOW (ref 78.0–100.0)
Platelets: 276 10*3/uL (ref 150–400)
RBC: 6.21 MIL/uL — ABNORMAL HIGH (ref 3.87–5.11)
RDW: 20.2 % — ABNORMAL HIGH (ref 11.5–15.5)
WBC: 13.6 10*3/uL — AB (ref 4.0–10.5)

## 2016-07-27 LAB — BASIC METABOLIC PANEL
ANION GAP: 7 (ref 5–15)
BUN: 22 mg/dL — ABNORMAL HIGH (ref 6–20)
CALCIUM: 7.6 mg/dL — AB (ref 8.9–10.3)
CHLORIDE: 108 mmol/L (ref 101–111)
CO2: 22 mmol/L (ref 22–32)
Creatinine, Ser: 0.97 mg/dL (ref 0.44–1.00)
GFR calc non Af Amer: 51 mL/min — ABNORMAL LOW (ref >60)
GFR, EST AFRICAN AMERICAN: 59 mL/min — AB (ref >60)
Glucose, Bld: 96 mg/dL (ref 65–99)
Potassium: 3.3 mmol/L — ABNORMAL LOW (ref 3.5–5.1)
Sodium: 137 mmol/L (ref 135–145)

## 2016-07-27 LAB — GLUCOSE, CAPILLARY: GLUCOSE-CAPILLARY: 85 mg/dL (ref 65–99)

## 2016-07-27 MED ORDER — SODIUM CHLORIDE 0.9 % IV BOLUS (SEPSIS): 500.0000 mL | Freq: Once | INTRAVENOUS | Status: DC

## 2016-07-27 MED ORDER — POTASSIUM CHLORIDE CRYS ER 20 MEQ PO TBCR
40.0000 meq | EXTENDED_RELEASE_TABLET | Freq: Two times a day (BID) | ORAL | Status: AC
  Administered 2016-07-27 – 2016-07-28 (×3): 40 meq via ORAL
  Filled 2016-07-27 (×3): qty 2

## 2016-07-27 MED ORDER — MAGNESIUM SULFATE 2 GM/50ML IV SOLN
2.0000 g | Freq: Once | INTRAVENOUS | Status: AC
  Administered 2016-07-27: 2 g via INTRAVENOUS
  Filled 2016-07-27: qty 50

## 2016-07-27 MED ORDER — IBUPROFEN 200 MG PO TABS: 400.0000 mg | ORAL_TABLET | Freq: Once | ORAL | Status: DC

## 2016-07-27 NOTE — Evaluation (Signed)
Clinical/Bedside Swallow Evaluation Patient Details  Name: [name] MRN: [id number] Date of Birth: Aug 11, 1926  Today's Date: 07/27/2016 Time: SLP Start Time (ACUTE ONLY): 1050 SLP Stop Time (ACUTE ONLY): 1105 SLP Time Calculation (min) (ACUTE ONLY): 15 min  Past Medical History:  Past Medical History:  Diagnosis Date  . Acute blood loss anemia   . Arthritis   . Atrophic gastritis   . Breast cancer ([place])    right  . Candida esophagitis ([place])   . Chest tightness   . Chronic gastritis   . Closed intertrochanteric fracture of left femur with routine healing   . Diverticulosis   . Hypertension   . IBS (irritable bowel syndrome)   . MVP (mitral valve prolapse)   . Palpitation   . Polycythemia   . PVC's (premature ventricular contractions)   . Rhinitis   . Unsteady gait    Past Surgical History:  Past Surgical History:  Procedure Laterality Date  . ABDOMINAL HYSTERECTOMY     ovaries taken  . APPENDECTOMY    . BREAST LUMPECTOMY     right  . CARDIOVASCULAR STRESS TEST  12/23/2000   EF 70%  . CATARACT EXTRACTION     bilateral  . ELBOW FRACTURE SURGERY     left  . FEMUR IM NAIL Left 05/08/2015   Procedure: INTERTROCHANTERIC FEMORAL NAIL;  Surgeon: Rod Can, MD;  Location: [facility];  Service: Orthopedics;  Laterality: Left;  . HEMATOMA EVACUATION     right  . TONSILLECTOMY    . TRANSTHORACIC ECHOCARDIOGRAM  03/05/2010   EF 55-60%   HPI:  83 y.o. female with medical history significant of osteoarthritis, atrophic gastritis, breast cancer, history of candidal esophagitis, diverticulosis, hypertension, IBS, mitral valve prolapse, polycythemia, PVCs, rhinitis, unsteady gait who was brought to the emergency department due to trouble finding words and and questionable confusion. CT scan of head was negative for acute finding. She was found to have hypertensive urgency. Differential diagnosis includes meningoencephalitis vs seizure disorder vs toxic/metabolis etioloy. MRI:  right subfrontal extra-axial mass with out edema, likely incidental meningioma.    Assessment / Plan / Recommendation Clinical Impression  Patient presents with a suspected primary esophageal dysphagia with c/o intermittent globus sensation and occassional multiple swallow without evidence of oropharyngeal difficulty. Oral phase appropriate, timing of swallow initiation appearing intact, and patient without overt indication of aspiration. H/o esophageal dilation in the past noted. No further SLP needs indicated. Educated patient regarding esophageal precautions. Recommend GI consult to determine need for further esophageal w/u given history and symptoms.  SLP Visit Diagnosis: Dysphagia, unspecified (R13.10)    Aspiration Risk  Mild aspiration risk    Diet Recommendation Regular;Thin liquid   Liquid Administration via: Straw;Cup Medication Administration: Whole meds with liquid Supervision: Patient able to self feed Compensations: Small sips/bites;Slow rate;Follow solids with liquid Postural Changes: Seated upright at 90 degrees;Remain upright for at least 30 minutes after po intake    Other  Recommendations Oral Care Recommendations: Oral care BID   Follow up Recommendations None      \  Swallow Study   General HPI: 83 y.o. female with medical history significant of osteoarthritis, atrophic gastritis, breast cancer, history of candidal esophagitis, diverticulosis, hypertension, IBS, mitral valve prolapse, polycythemia, PVCs, rhinitis, unsteady gait who was brought to the emergency department due to trouble finding words and and questionable confusion. CT scan of head was negative for acute finding. She was found to have hypertensive urgency. Differential diagnosis includes meningoencephalitis vs seizure disorder vs toxic/metabolis  etioloy. MRI: right subfrontal extra-axial mass with out edema, likely incidental meningioma.  Type of Study: Bedside Swallow Evaluation Previous Swallow  Assessment: none noted Diet Prior to this Study: Regular;Thin liquids Temperature Spikes Noted: No Respiratory Status: Room air History of Recent Intubation: No Behavior/Cognition: Alert;Cooperative;Pleasant mood Oral Cavity Assessment: Within Functional Limits Oral Care Completed by SLP: Recent completion by staff Oral Cavity - Dentition: Adequate natural dentition Vision: Functional for self-feeding Self-Feeding Abilities: Able to feed self Patient Positioning: Upright in bed Baseline Vocal Quality: Normal Volitional Cough: Strong Volitional Swallow: Able to elicit    Oral/Motor/Sensory Function Overall Oral Motor/Sensory Function: Within functional limits   Ice Chips Ice chips: Not tested   Thin Liquid Thin Liquid: Within functional limits Presentation: Cup;Self Fed;Straw    Nectar Thick Nectar Thick Liquid: Not tested   Honey Thick Honey Thick Liquid: Not tested   Puree Puree: Not tested   Solid   Kelly Meisinger MA, CCC-SLP [phone]    Solid: Within functional limits Presentation: Self Fed        [name] [name] 07/27/2016,11:05 AM

## 2016-07-27 NOTE — Telephone Encounter (Signed)
FYI "[name] is in ICU.  Cancel tomorrow's appointments.  They do not know what is going on yet.  I tell everyone she has polycythemia, her HCT was 45.1 but they do not seem to know what it is.  She couldn't talk, became unresponsive with eyes closed is why I took her to the ED.  She has started to respond a little."   Phlebotomy appointment cancelled.  Asked for return call with discharge to reschedule as needed.

## 2016-07-27 NOTE — Progress Notes (Addendum)
PROGRESS NOTE    [name]  BOF:[id number] DOB: 1927/01/22 DOA: 07/24/2016 PCP: Deland Pretty, MD   Brief Narrative: 83 year old female with history of osteoarthritis, history of breast cancer, diverticulosis, hypertension, mitral valve prolapse, polycythemia follows up with oncologist, presented to the hospital on 6/24 with trouble finding words and possible confusion. In the ER patient was found to have blood pressure of 212/99 with chronic leukocytosis. CT scan of the head unremarkable. MRI of the brain showed no acute distress. In the evening patient become febrile and worsening mental status. LP was done and he started on empiric antibiotics for possible meningoencephalitis. Patient was moved to the stepdown unit.  Assessment & Plan:  # Altered mental status/word finding difficulty/ acute encephalopathy likely due to meningoencephalitis versus seizure disorder. The differentials include toxic/metabolic etiology. -Status post lumbar puncture, has no white cell counts, follow up final results. Currently on empiric antibiotics including ampicillin, ceftriaxone, vancomycin and acyclovir.  -Patient was also loaded with Keppra and he started on 500 mg twice a day. EEG with no epileptic foci. Continue seizure precaution. I have discussed with Dr. Cristobal Goldmann from neurologist today regarding the neurology plan and follows. He reported that the patient will be seen. -Patient with clinical improvement. She was alert awake and denies any new symptoms. -Since CSF has no white cell count, may be able to discontinue IV antibiotics except IV acyclovir. I consulted Dr. Megan Salon from infectious disease. -MRI with advanced atrophy and chronic microvascular ischemic changes. There is also right subfrontal extra-axial mass without edema likely meningioma. As per neurosurgery consult, no indication for treatment, follow-up imaging only if new symptoms appear. I have discussed this with patient's son and daughter  at bedside. -monitor fever curve, leukocytosis.  -Vitamin B12 and ammonia level acceptable.  # Hypertensive urgency on admission:  -Monitor blood pressure. She and is on multiple antihypertensive medications.  # Polycythemia vera:  -gets outpatient schedule phlebotomies.  -Monitor CBC closely. Leukocytosis improving.  # Mild hyponatremia:  Monitor BMP. Serum sodium level improved. Encourage oral intake. I will discontinue IV fluid.  #TSH level elevated. Free T4 normal. Follow-up free T3 level. Need to follow up with PCP.   # 6 beats of NSVT on 6/25: Echocardiogram unremarkable. Patient is asymptomatic. Patient with mild hypokalemia and hypomagnesemia. Electrolytes repeated today. Monitor labs in a.m.  Principal Problem:   Hypertensive urgency Active Problems:   Polycythemia vera ([facility])   Leukocytosis   Thrombocytosis ([facility])   Acute encephalopathy   Protein-calorie malnutrition, severe   Meningoencephalitis   Seizure ([facility])  DVT prophylaxis: Heparin subcutaneous Code Status: Full code Family Communication: Discussed with the patient's son and daughter at bedside Disposition Plan: Currently in the stepdown    Consultants:   Neurology  Neurosurgery  Infectious disease  Procedures: Lumbar puncture Antimicrobials: Acyclovir, ampicillin, ceftriaxone and vancomycin since June 25.  Subjective: Seen and examined at bedside in the stepdown. Patient was more alert awake and sitting on bed. Denies headache, dizziness, nausea vomiting chest pain or shortness of breath. Objective: Vitals:   07/27/16 0323 07/27/16 0400 07/27/16 0800 07/27/16 1100  BP:  (!) 155/69  (!) 181/73  Pulse:    85  Resp:  16  14  Temp: 98.7 F (37.1 C)  98.2 F (36.8 C)   TempSrc: Axillary  Oral   SpO2:    96%  Weight:      Height:        Intake/Output Summary (Last 24 hours) at 07/27/16 1114 Last data filed at 07/27/16  8416  Gross per 24 hour  Intake          2236.95 ml  Output               950 ml  Net          1286.95 ml   Filed Weights   07/24/16 2023 07/25/16 0047  Weight: 40.8 kg (90 lb) 41.1 kg (90 lb 9.6 oz)    Examination:  General exam: Sitting on bed comfortable, not in distress Respiratory system: Clear bilateral. Respiratory effort normal. No wheezing or crackle Cardiovascular system: Regular rate rhythm, S1-S2 normal. No pedal edema. Gastrointestinal system: Abdomen soft, nontender, nondistended. Bowel sound positive Central nervous system: Alert awake and following commands Extremities: Symmetric 5 x 5 power. Skin: No rashes, lesions or ulcers    Data Reviewed: I have personally reviewed following labs and imaging studies  CBC:  Recent Labs Lab 07/24/16 2027 07/25/16 0505 07/26/16 0815 07/27/16 0308  WBC 12.5* 14.9* 18.6* 13.6*  NEUTROABS  --  13.3*  --   --   HGB 14.1 12.0 13.3 12.3  HCT 45.1 39.9 42.7 38.9  MCV 63.3* 63.3* 62.2* 62.6*  PLT 303 306 304 606   Basic Metabolic Panel:  Recent Labs Lab 07/24/16 2027 07/25/16 0505 07/26/16 0815 07/27/16 0308  NA 137 135 132* 137  K 4.6 4.3 3.6 3.3*  CL 107 103 101 108  CO2 24 24 22 22   GLUCOSE 123* 133* 113* 96  BUN 26* 27* 22* 22*  CREATININE 1.09* 1.13* 1.07* 0.97  CALCIUM 9.3 8.8* 8.6* 7.6*  MG  --  2.1 1.8 1.6*  PHOS  --  2.8  --   --    GFR: Estimated Creatinine Clearance: 26 mL/min (by C-G formula based on SCr of 0.97 mg/dL). Liver Function Tests:  Recent Labs Lab 07/24/16 2027  AST 17  ALT 10*  ALKPHOS 71  BILITOT 0.5  PROT 6.7  ALBUMIN 4.0   No results for input(s): LIPASE, AMYLASE in the last 168 hours.  Recent Labs Lab 07/25/16 1134  AMMONIA 15   Coagulation Profile: No results for input(s): INR, PROTIME in the last 168 hours. Cardiac Enzymes: No results for input(s): CKTOTAL, CKMB, CKMBINDEX, TROPONINI in the last 168 hours. BNP (last 3 results) No results for input(s): PROBNP in the last 8760 hours. HbA1C: No results for input(s): HGBA1C in the last  72 hours. CBG:  Recent Labs Lab 07/24/16 2104 07/25/16 1934 07/26/16 1910 07/27/16 0800  GLUCAP 110* 122* 95 85   Lipid Profile:  Recent Labs  07/25/16 0600  CHOL 131  HDL 46  LDLCALC 65  TRIG 101  CHOLHDL 2.8   Thyroid Function Tests:  Recent Labs  07/25/16 1134 07/25/16 1533  TSH 5.593*  --   FREET4  --  1.12  T3FREE  --  2.2   Anemia Panel:  Recent Labs  07/25/16 1134  VITAMINB12 692   Sepsis Labs: No results for input(s): PROCALCITON, LATICACIDVEN in the last 168 hours.  Recent Results (from the past 240 hour(s))  MRSA PCR Screening     Status: None   Collection Time: 07/25/16 10:45 PM  Result Value Ref Range Status   MRSA by PCR NEGATIVE NEGATIVE Final    Comment:        The GeneXpert MRSA Assay (FDA approved for NASAL specimens only), is one component of a comprehensive MRSA colonization surveillance program. It is not intended to diagnose MRSA infection nor to guide or monitor treatment  for MRSA infections.   CSF culture     Status: None (Preliminary result)   Collection Time: 07/26/16  2:18 AM  Result Value Ref Range Status   Specimen Description CSF  Final   Special Requests Normal  Final   Gram Stain   Final    NO ORGANISMS SEEN RARE WBC SEEN Gram Stain Report Called to,Read Back By and Verified With: HUI AT 0339 ON 06.25.2018 BY NBROOKS    Culture   Final    NO GROWTH 1 DAY Performed at [facility], [place] [address], [place], [place] [address]    Report Status PENDING  Incomplete         Radiology Studies: Mr Brain Wo Contrast  Result Date: 07/25/2016 CLINICAL DATA:  Confusion, difficulty speaking, high blood pressure. Symptoms began yesterday. History of polycythemia and thrombocytosis. EXAM: MRI HEAD WITHOUT CONTRAST TECHNIQUE: Multiplanar, multiecho pulse sequences of the brain and surrounding structures were obtained without intravenous contrast. COMPARISON:  CT head 07/24/2016. FINDINGS: Brain: No evidence for  acute infarction, hemorrhage, or extra-axial fluid. Generalized atrophy. Hydrocephalus ex vacuo. Chronic microvascular ischemic change. Along the cribriform plate, RIGHT paramedian location, an extra-axial mass measuring 10 x 14 x 9 mm is present, displacing the gyrus rectus without edema, most consistent with a meningioma. Vascular: Normal flow voids. Skull and upper cervical spine: There is mild expansion of the clivus, and low signal T1 intensity bone marrow in the clivus and visualized upper cervical region. This is likely hematologic in origin. Sinuses/Orbits: Negative. Other: None. IMPRESSION: Advanced atrophy. Chronic microvascular ischemic change. No acute stroke. 10 x 14 x 9 mm RIGHT subfrontal extra-axial mass, without edema, likely incidental meningioma in this elderly female. Diffuse low T1 signal intensity bone marrow, likely related to polycythemia and thrombocytosis. Electronically Signed   By: Staci Righter M.D.   On: 07/25/2016 17:48   Dg Chest Port 1 View  Result Date: 07/26/2016 CLINICAL DATA:  Fever and tremors tonight. EXAM: PORTABLE CHEST 1 VIEW COMPARISON:  05/08/2015 FINDINGS: A single AP portable view of the chest demonstrates no focal airspace consolidation or alveolar edema. The lungs are grossly clear. There is no large effusion or pneumothorax. Cardiac and mediastinal contours appear unremarkable. IMPRESSION: No active disease. Electronically Signed   By: Andreas Newport M.D.   On: 07/26/2016 00:30        Scheduled Meds: . amLODipine  5 mg Oral Daily  . anagrelide  2 mg Oral BID  . aspirin EC  81 mg Oral Daily  . calcium-vitamin D  2 tablet Oral Q breakfast  . cloNIDine  0.1 mg Oral Once  . colestipol  1 g Oral Daily  . feeding supplement (ENSURE ENLIVE)  237 mL Oral BID BM  . heparin  5,000 Units Subcutaneous Q8H  . hydrochlorothiazide  12.5 mg Oral Daily  . lisinopril  20 mg Oral Daily  . metoprolol succinate  50 mg Oral Daily  . potassium chloride  40 mEq Oral  BID   Continuous Infusions: . sodium chloride 75 mL/hr at 07/27/16 0516  . acyclovir Stopped (07/27/16 0415)  . ampicillin (OMNIPEN) IV 2 g (07/27/16 1107)  . cefTRIAXone (ROCEPHIN)  IV 2 g (07/27/16 1108)  . levETIRAcetam Stopped (07/27/16 0855)  . vancomycin Stopped (07/27/16 8756)     LOS: 1 day    Ewel Lona Tanna Furry, MD [facility] Pager [phone]  If 7PM-7AM, please contact night-coverage [email or web address] Password [email or web address] 07/27/2016, 11:14 AM

## 2016-07-27 NOTE — Consult Note (Signed)
[facility]    Date of Admission:  07/24/2016   Day 2 vancomycin        Day 2 ceftriaxone        Day 2 ampicillin        Day 2 acyclovir       Reason for Consult: Unexplained fever and encephalopathy    Referring Provider: Dr. Lawson Radar  Assessment: [name] had acute mental status change in the setting of a temperature to 102. It is unclear if she was having any fever at home. The cause of this fever remains unclear. Her lumbar puncture does not show any evidence of meningoencephalitis. She has no evidence of pneumonia by chest x-ray or exam. Her urinalysis is normal. Her blood cultures are negative at 36 hours. I will begin to narrow her antibiotic therapy. I will stop ampicillin and acyclovir now. If blood cultures remain negative and she has no further signs of infection I will consider stopping vancomycin and ceftriaxone tomorrow.   Plan: 1. Continue vancomycin and ceftriaxone pending blood culture results and further observation  2. Discontinue ampicillin and acyclovir  Principal Problem:   Acute encephalopathy Active Problems:   Fever   Polycythemia vera (HCC)   Leukocytosis   Thrombocytosis ([facility]CC)   Hypertensive urgency   Protein-calorie malnutrition, severe   Seizure ([place])   . amLODipine  5 mg Oral Daily  . anagrelide  2 mg Oral BID  . aspirin EC  81 mg Oral Daily  . calcium-vitamin D  2 tablet Oral Q breakfast  . cloNIDine  0.1 mg Oral Once  . colestipol  1 g Oral Daily  . feeding supplement (ENSURE ENLIVE)  237 mL Oral BID BM  . heparin  5,000 Units Subcutaneous Q8H  . hydrochlorothiazide  12.5 mg Oral Daily  . lisinopril  20 mg Oral Daily  . metoprolol succinate  50 mg Oral Daily  . potassium chloride  40 mEq Oral BID    HPI: [name] is a 83 y.o. female with history of polycythemia and hypertension. Three days ago her daughter noted that she was unable to complete sentences and appeared confused when  talking to her on the phone. When she went to her home she became alarmed and brought her here to the hospital for admission. She was noted to be searching for words. She developed fever and became more lethargic. Blood cultures were obtained and she underwent lumbar puncture. Broad antimicrobial therapy was started to cover for the possibility of meningoencephalitis. Her son notes that she is been in gradually declining health over the past few years. She suffered a hip fracture that has greatly limited her mobility. She shuffles and walks very slowly. He states that over the past month he has noted that she seems to become somewhat confused at times. She has a little more forgetful and seems to forget what she is talking about.   Review of Systems: Review of Systems  Constitutional: Positive for fever, malaise/fatigue and weight loss. Negative for chills and diaphoresis.       She has had gradual, unintentional weight loss over the past few years  HENT: Negative for congestion and sore throat.   Respiratory: Negative for cough, sputum production and shortness of breath.   Cardiovascular: Negative for chest pain.  Gastrointestinal: Positive for abdominal pain and diarrhea. Negative for nausea and vomiting.       She has chronic, stable diarrhea  with abdominal cramps.  Genitourinary: Negative for dysuria, frequency and urgency.  Musculoskeletal: Positive for joint pain. Negative for myalgias.  Skin: Negative for rash.  Neurological: Positive for tremors, speech change, weakness and headaches. Negative for dizziness.    Past Medical History:  Diagnosis Date  . Acute blood loss anemia   . Arthritis   . Atrophic gastritis   . Breast cancer ([place])    right  . Candida esophagitis ([place])   . Chest tightness   . Chronic gastritis   . Closed intertrochanteric fracture of left femur with routine healing   . Diverticulosis   . Hypertension   . IBS (irritable bowel syndrome)   . MVP (mitral valve  prolapse)   . Palpitation   . Polycythemia   . PVC's (premature ventricular contractions)   . Rhinitis   . Unsteady gait     Social History  Substance Use Topics  . Smoking status: Former Smoker    Quit date: 08/24/1980  . Smokeless tobacco: Never Used  . Alcohol use Yes     Comment: occasional    Family History  Problem Relation Age of Onset  . Stomach cancer Mother   . Hypertension Father   . Stroke Father   . Prostate cancer Father   . Hypertension Sister   . Hypertension Brother   . Prostate cancer Brother   . Diabetes Brother   . Colon cancer Neg Hx    Allergies  Allergen Reactions  . Sulfur Other (See Comments)    Pt doesn't remember reaction.    OBJECTIVE: Blood pressure (!) 124/44, pulse 90, temperature 98.6 F (37 C), temperature source Oral, resp. rate (!) 22, height 5\' 2"  (1.575 m), weight 90 lb 9.6 oz (41.1 kg), SpO2 96 %.  Physical Exam  Constitutional:  She is alert and resting quietly in bed. Her son is visiting at the bedside.  HENT:  Mouth/Throat: No oropharyngeal exudate.  Eyes: Conjunctivae are normal.  Cardiovascular: Normal rate and regular rhythm.   Pulmonary/Chest: Effort normal. She has no wheezes. She has no rales.  Abdominal: Soft. She exhibits no distension. There is no tenderness.  Musculoskeletal: Normal range of motion. She exhibits no edema or tenderness.  Neurological: She is alert.  She has no recall of the events leading up to this admission. She has difficulty finding words. She states " I'm going to have to learn to talk again". She cannot say cardiologist correctly.  Skin: No rash noted.  Psychiatric: Mood and affect normal.    Lab Results Lab Results  Component Value Date   WBC 13.6 (H) 07/27/2016   HGB 12.3 07/27/2016   HCT 38.9 07/27/2016   MCV 62.6 (L) 07/27/2016   PLT 276 07/27/2016    Lab Results  Component Value Date   CREATININE 0.97 07/27/2016   BUN 22 (H) 07/27/2016   NA 137 07/27/2016   K 3.3 (L)  07/27/2016   CL 108 07/27/2016   CO2 22 07/27/2016    Lab Results  Component Value Date   ALT 10 (L) 07/24/2016   AST 17 07/24/2016   ALKPHOS 71 07/24/2016   BILITOT 0.5 07/24/2016     Microbiology: Recent Results (from the past 240 hour(s))  MRSA PCR Screening     Status: None   Collection Time: 07/25/16 10:45 PM  Result Value Ref Range Status   MRSA by PCR NEGATIVE NEGATIVE Final    Comment:        The GeneXpert MRSA Assay (FDA approved for  NASAL specimens only), is one component of a comprehensive MRSA colonization surveillance program. It is not intended to diagnose MRSA infection nor to guide or monitor treatment for MRSA infections.   CSF culture     Status: None (Preliminary result)   Collection Time: 07/26/16  2:18 AM  Result Value Ref Range Status   Specimen Description CSF  Final   Special Requests Normal  Final   Gram Stain   Final    NO ORGANISMS SEEN RARE WBC SEEN Gram Stain Report Called to,Read Back By and Verified With: HUI AT 0339 ON 06.25.2018 BY NBROOKS    Culture   Final    NO GROWTH 1 DAY Performed at [facility], [place] [address], [place], [place] [address]    Report Status PENDING  Incomplete    Michel Bickers, MD [facility] [phone] pager   [phone] cell 07/27/2016, 2:43 PM

## 2016-07-28 ENCOUNTER — Other Ambulatory Visit: Payer: Medicare Other

## 2016-07-28 DIAGNOSIS — D72829 Elevated white blood cell count, unspecified: Secondary | ICD-10-CM

## 2016-07-28 LAB — BASIC METABOLIC PANEL
ANION GAP: 8 (ref 5–15)
BUN: 21 mg/dL — ABNORMAL HIGH (ref 6–20)
CHLORIDE: 103 mmol/L (ref 101–111)
CO2: 21 mmol/L — ABNORMAL LOW (ref 22–32)
Calcium: 7.5 mg/dL — ABNORMAL LOW (ref 8.9–10.3)
Creatinine, Ser: 1.02 mg/dL — ABNORMAL HIGH (ref 0.44–1.00)
GFR calc Af Amer: 55 mL/min — ABNORMAL LOW (ref >60)
GFR, EST NON AFRICAN AMERICAN: 48 mL/min — AB (ref >60)
Glucose, Bld: 144 mg/dL — ABNORMAL HIGH (ref 65–99)
POTASSIUM: 4 mmol/L (ref 3.5–5.1)
SODIUM: 132 mmol/L — AB (ref 135–145)

## 2016-07-28 LAB — CBC
HCT: 39.6 % (ref 36.0–46.0)
HEMOGLOBIN: 12.2 g/dL (ref 12.0–15.0)
MCH: 19.3 pg — ABNORMAL LOW (ref 26.0–34.0)
MCHC: 30.8 g/dL (ref 30.0–36.0)
MCV: 62.8 fL — ABNORMAL LOW (ref 78.0–100.0)
PLATELETS: 367 10*3/uL (ref 150–400)
RBC: 6.31 MIL/uL — AB (ref 3.87–5.11)
RDW: 20.1 % — ABNORMAL HIGH (ref 11.5–15.5)
WBC: 18.9 10*3/uL — AB (ref 4.0–10.5)

## 2016-07-28 LAB — HERPES SIMPLEX VIRUS(HSV) DNA BY PCR
HSV 1 DNA: NEGATIVE
HSV 2 DNA: NEGATIVE

## 2016-07-28 LAB — MAGNESIUM: MAGNESIUM: 2.7 mg/dL — AB (ref 1.7–2.4)

## 2016-07-28 LAB — VDRL, CSF: SYPHILIS VDRL QUANT CSF: NONREACTIVE

## 2016-07-28 MED ORDER — FUROSEMIDE 20 MG PO TABS
20.0000 mg | ORAL_TABLET | Freq: Every day | ORAL | Status: DC
  Administered 2016-07-28 – 2016-07-29 (×2): 20 mg via ORAL
  Filled 2016-07-28 (×2): qty 1

## 2016-07-28 NOTE — NC FL2 (Signed)
[place] LEVEL OF CARE SCREENING TOOL     IDENTIFICATION  Patient Name: [name] Birthdate: 06/09/1926 Sex: female Admission Date (Current Location): 07/24/2016  [facility] and [place] Number:  Herbalist and Address:  [facility],  [place] [address] [place]      Provider Number: [id number]  Attending Physician Name and Address:  Rosita Fire, MD  Relative Name and Phone Number:       Current Level of Care: Hospital Recommended Level of Care: [place] Prior Approval Number:    Date Approved/Denied:   PASRR Number: [id number]  Discharge Plan: SNF    Current Diagnoses: Patient Active Problem List   Diagnosis Date Noted  . Fever 07/27/2016  . Acute encephalopathy 07/26/2016  . Protein-calorie malnutrition, severe 07/26/2016  . Altered mental status   . Seizure ([place])   . Hypertensive urgency 07/25/2016  . Foot swelling 04/28/2016  . History of breast cancer in female 08/13/2015  . Osteoporosis 07/01/2015  . Dehydration 06/11/2015  . Acute blood loss anemia   . Thrombocytosis ([address])   . Faintness   . Closed left hip fracture ([place]) 05/08/2015  . Leukocytosis 05/08/2015  . Fracture, intertrochanteric, left femur ([place]) 05/08/2015  . HTN (hypertension) 08/27/2010  . MVP (mitral valve prolapse) 08/27/2010  . Malignant neoplasm of female breast ([place]) 06/20/2007  . Polycythemia vera ([place]) 06/20/2007  . ABDOMINAL PAIN, EPIGASTRIC 06/20/2007  . Atrophic gastritis 06/19/2007  . DIVERTICULOSIS OF COLON 06/19/2007  . ARTHRITIS 06/19/2007    Orientation RESPIRATION BLADDER Height & Weight     Self, Place  O2 Indwelling catheter Weight: 90 lb 9.6 oz (41.1 kg) Height:  5\' 2"  (157.5 cm)  BEHAVIORAL SYMPTOMS/MOOD NEUROLOGICAL BOWEL NUTRITION STATUS  Other (Comment)   Continent Diet  AMBULATORY STATUS COMMUNICATION OF NEEDS Skin   Limited Assist Verbally Normal                       Personal  Care Assistance Level of Assistance  Bathing, Feeding, Dressing Bathing Assistance: Limited assistance Feeding assistance: Independent Dressing Assistance: Limited assistance     Functional Limitations Info  Sight, Hearing, Speech Sight Info: Adequate Hearing Info: Adequate Speech Info: Adequate    SPECIAL CARE FACTORS FREQUENCY  PT (By licensed PT), OT (By licensed OT)     PT Frequency: 5x wk OT Frequency: 5x wk            Contractures Contractures Info: Not present    Additional Factors Info  Allergies, Code Status Code Status Info: Full Code Allergies Info: Sulfur           Current Medications (07/28/2016):  This is the current hospital active medication list Current Facility-Administered Medications  Medication Dose Route Frequency Provider Last Rate Last Dose  . acetaminophen (TYLENOL) suppository 650 mg  650 mg Rectal Q6H PRN Rise Patience, MD   650 mg at 07/26/16 0009  . acetaminophen (TYLENOL) tablet 650 mg  650 mg Oral Q6H PRN Reubin Milan, MD   650 mg at 07/25/16 0134  . amLODipine (NORVASC) tablet 5 mg  5 mg Oral Daily Rosita Fire, MD   5 mg at 07/28/16 1019  . anagrelide (AGRYLIN) capsule 2 mg  2 mg Oral BID Reubin Milan, MD   2 mg at 07/28/16 1020  . aspirin EC tablet 81 mg  81 mg Oral Daily Reubin Milan, MD   81 mg at 07/28/16 1019  .  calcium-vitamin D (OSCAL WITH D) 500-200 MG-UNIT per tablet 2 tablet  2 tablet Oral Q breakfast Reubin Milan, MD   2 tablet at 07/28/16 1019  . cloNIDine (CATAPRES) tablet 0.1 mg  0.1 mg Oral Once Reubin Milan, MD      . colestipol (COLESTID) tablet 1 g  1 g Oral Daily Reubin Milan, MD   1 g at 07/28/16 1020  . feeding supplement (ENSURE ENLIVE) (ENSURE ENLIVE) liquid 237 mL  237 mL Oral BID BM Reubin Milan, MD   237 mL at 07/28/16 1000  . fluticasone (FLONASE) 50 MCG/ACT nasal spray 2 spray  2 spray Each Nare PRN Reubin Milan, MD      . furosemide  (LASIX) tablet 20 mg  20 mg Oral Daily Rosita Fire, MD      . heparin injection 5,000 Units  5,000 Units Subcutaneous Q8H Reubin Milan, MD   5,000 Units at 07/28/16 [phone]  . hydrALAZINE (APRESOLINE) injection 10 mg  10 mg Intravenous Q4H PRN Rise Patience, MD   10 mg at 07/26/16 1319  . levETIRAcetam (KEPPRA) 500 mg in sodium chloride 0.9 % 100 mL IVPB  500 mg Intravenous Q12H Kerney Elbe, MD   Stopped at 07/28/16 1034  . lisinopril (PRINIVIL,ZESTRIL) tablet 20 mg  20 mg Oral Daily Reubin Milan, MD   20 mg at 07/28/16 1019  . loperamide (IMODIUM) capsule 2 mg  2 mg Oral PRN Reubin Milan, MD      . metoprolol succinate (TOPROL-XL) 24 hr tablet 50 mg  50 mg Oral Daily Reubin Milan, MD   50 mg at 07/28/16 1019  . ondansetron (ZOFRAN) tablet 4 mg  4 mg Oral Q6H PRN Reubin Milan, MD       Or  . ondansetron [facility]) injection 4 mg  4 mg Intravenous Q6H PRN Reubin Milan, MD      . pantoprazole (PROTONIX) EC tablet 40 mg  40 mg Oral PRN Reubin Milan, MD         Discharge Medications: Please see discharge summary for a list of discharge medications.  Relevant Imaging Results:  Relevant Lab Results:   Additional Information SSN: [id number]. Pt has chemo appt on 7/25 and 8/22 at [facility].  Masiah Lewing, Randall An, LCSW

## 2016-07-28 NOTE — Progress Notes (Signed)
Patient ID: [name], female   DOB: December 21, 1926, 83 y.o.   MRN: [id number]          [facility] for Infectious Disease    Date of Admission:  07/24/2016   Day 2 vancomycin        Day 2 ceftriaxone  She is currently working with a therapist. She is still having some problems with speech and word searching. Her nurse reports that she was confused overnight. She has had no more fever. Her blood cultures remain negative. She has no evidence of meningitis by lumbar puncture. I will discontinue empiric vancomycin and ceftriaxone now.         Michel Bickers, MD [facility] for Infectious [place] [facility] [phone] pager   [phone] cell 07/28/2016, 10:35 AM

## 2016-07-28 NOTE — Progress Notes (Signed)
Physical Therapy Treatment Patient Details Name: [name] MRN: [id number] DOB: 06/10/1926 Today's Date: 07/28/2016    History of Present Illness 83 y.o. female with medical history significant of osteoarthritis, atrophic gastritis, breast cancer, history of candidal esophagitis, diverticulosis, hypertension, IBS, mitral valve prolapse, polycythemia, PVCs, rhinitis, unsteady gait who was brought to the emergency department due to trouble finding words and and questionable confusion. CT scan of head was negative for acute finding. She was found to have hypertensive urgency. MRI: right subfrontal extra-axial mass with out edema, likely incidental meningioma. Work up underway.     PT Comments    Marked improvement in balance and activity tolerance.     Follow Up Recommendations  SNF     Equipment Recommendations  None recommended by PT    Recommendations for Other Services OT consult     Precautions / Restrictions Precautions Precautions: Fall Restrictions Weight Bearing Restrictions: No    Mobility  Bed Mobility Overal bed mobility: Needs Assistance Bed Mobility: Rolling;Supine to Sit Rolling: Min guard   Supine to sit: Min assist     General bed mobility comments: Use of bed rail to roll.  Min assist to complete transition to sitting at EOB  Transfers Overall transfer level: Needs assistance Equipment used: Rolling walker (2 wheeled) Transfers: Sit to/from Stand Sit to Stand: Min assist;+2 safety/equipment         General transfer comment: cues for safe transition position and use of UEs to self assist  Ambulation/Gait Ambulation/Gait assistance: Min assist;+2 safety/equipment Ambulation Distance (Feet): 200 Feet Assistive device: Rolling walker (2 wheeled) Gait Pattern/deviations: Step-through pattern;Decreased step length - right;Decreased step length - left;Shuffle;Trunk flexed;Narrow base of support Gait velocity: decr Gait velocity interpretation:  Below normal speed for age/gender General Gait Details: cues for posture and position from RW   Stairs            Wheelchair Mobility    Modified Rankin (Stroke Patients Only)       Balance Overall balance assessment: Needs assistance Sitting-balance support: Feet supported Sitting balance-Leahy Scale: Fair     Standing balance support: No upper extremity supported Standing balance-Leahy Scale: Fair                              Cognition Arousal/Alertness: Awake/alert Behavior During Therapy: Impulsive Overall Cognitive Status: Within Functional Limits for tasks assessed Area of Impairment: Safety/judgement (Pt impulsive and repeatedly attempting to stand unassisted)                       Following Commands: Follows one step commands inconsistently Safety/Judgement: Decreased awareness of safety            Exercises      General Comments        Pertinent Vitals/Pain Pain Assessment: No/denies pain    Home Living                      Prior Function            PT Goals (current goals can now be found in the care plan section) Acute Rehab PT Goals Patient Stated Goal: walk PT Goal Formulation: With patient Time For Goal Achievement: 08/09/16 Potential to Achieve Goals: Good Progress towards PT goals: Progressing toward goals    Frequency    Min 3X/week      PT Plan Current plan remains appropriate    Co-evaluation  AM-PAC PT "6 Clicks" Daily Activity  Outcome Measure  Difficulty turning over in bed (including adjusting bedclothes, sheets and blankets)?: Total Difficulty moving from lying on back to sitting on the side of the bed? : Total Difficulty sitting down on and standing up from a chair with arms (e.g., wheelchair, bedside commode, etc,.)?: Total Help needed moving to and from a bed to chair (including a wheelchair)?: A Little Help needed walking in hospital room?: A Little Help  needed climbing 3-5 steps with a railing? : A Lot 6 Click Score: 11    End of Session Equipment Utilized During Treatment: Gait belt Activity Tolerance: Patient tolerated treatment well Patient left: in chair;with call bell/phone within reach;with chair alarm set Nurse Communication: Mobility status PT Visit Diagnosis: Unsteadiness on feet (R26.81);Muscle weakness (generalized) (M62.81)     Time: 6270-3500 PT Time Calculation (min) (ACUTE ONLY): 25 min  Charges:  $Gait Training: 23-37 mins                    G Codes:        [name] August 04, 2016, 11:33 AM

## 2016-07-28 NOTE — Progress Notes (Addendum)
PROGRESS NOTE    [name]  PPI:[id number] DOB: [birth year] DOA: 07/24/2016 PCP: Deland Pretty, MD   Brief Narrative: 83 year old female with history of osteoarthritis, history of breast cancer, diverticulosis, hypertension, mitral valve prolapse, polycythemia follows up with oncologist, presented to the hospital on 6/24 with trouble finding words and possible confusion. In the ER patient was found to have blood pressure of 212/99 with chronic leukocytosis. CT scan of the head unremarkable. MRI of the brain showed no acute distress. In the evening patient become febrile and worsening mental status. LP was done and he started on empiric antibiotics for possible meningoencephalitis. Patient was moved to the stepdown unit.  Assessment & Plan:  # Altered mental status/word finding difficulty/ acute encephalopathy likely due to seizure disorder. The differentials include toxic/metabolic etiology. Thought to be due to meningoencephalitis but lumbar puncture result consistent with no infection. Initially treated with broad spectrum antibiotics including ampicillin, ceftriaxone, vancomycin and acyclovir. The antibiotics were discontinued by Dr. Bridget Hartshorn from infectious disease. Patient is clinically improving. I called neurologist today and informed about patient's and family's concern. The patient will be seen today. For now continue IV Keppra. Patient's vitals are stable. I'm transferring patient out of the stepdown. Continue PT OT therapy. Continue supportive therapy. I have discussed and updated test result and plan of care to the patient and her daughter and son at bedside. -Significant clinical improvement. -MRI with advanced atrophy and chronic microvascular ischemic changes. There is also right subfrontal extra-axial mass without edema likely meningioma. As per neurosurgery consult, no indication for treatment, follow-up imaging only if new symptoms appear. I have discussed this with patient's son  and daughter at bedside on 6/26. -monitor fever curve, leukocytosis.  -Vitamin B12 and ammonia level acceptable.  # Hypertensive urgency on admission:  -Monitor blood pressure. She is on multiple antihypertensive medications.  # Polycythemia vera:  -gets outpatient schedule phlebotomies.  -Monitor CBC closely. Fluctuation in white cell counts.  # Mild hyponatremia:  Monitor BMP. Patient has good oral intake. Discontinue IV fluid. I will discontinue hydrochlorothiazide and changed to low-dose Lasix.  #TSH level elevated. Free T4 normal. Follow-up free T3 level. Need to follow up with PCP.  # 6 beats of NSVT on 6/25: Echocardiogram unremarkable. Hypokalemia and hypomagnesemia improved.  Principal Problem:   Acute encephalopathy Active Problems:   Polycythemia vera (HCC)   Leukocytosis   Thrombocytosis (HCC)   Hypertensive urgency   Protein-calorie malnutrition, severe   Seizure (HCC)   Fever  DVT prophylaxis: Heparin subcutaneous Code Status: Full code Family Communication: Discussed with the patient's son and daughter at bedside Disposition Plan: transfer to tele floor.    Consultants:   Neurology  Neurosurgery  Infectious disease  Procedures: Lumbar puncture Antimicrobials: Acyclovir, ampicillin, ceftriaxone and vancomycin since June 25.  Subjective: Seen and examined at bedside. Patient is alert awake and oriented. Denied headache, dizziness, nausea, vomiting, chest pressure shortness of breath. Both son and daughter at bedside.  Objective: Vitals:   07/28/16 0400 07/28/16 0500 07/28/16 0600 07/28/16 0800  BP:  (!) 149/59 (!) 144/59   Pulse:  83 76   Resp: 17 12 13    Temp:    98.3 F (36.8 C)  TempSrc:    Oral  SpO2:  98% 96%   Weight:      Height:        Intake/Output Summary (Last 24 hours) at 07/28/16 1055 Last data filed at 07/28/16 0600  Gross per 24 hour  Intake  905 ml  Output             1375 ml  Net             -470 ml    Filed Weights   07/24/16 2023 07/25/16 0047  Weight: 40.8 kg (90 lb) 41.1 kg (90 lb 9.6 oz)    Examination:  General exam: Alert awake, sitting in bed comfortable Respiratory system: Clear bilateral Respiratory effort normal. No wheezing or crackle Cardiovascular system: Regular rate and rhythm, S1 normal. Gastrointestinal system: Abdomen soft, nontender, nondistended. Bowel sound positive Central nervous system: Alert, awake, oriented 3. No focal neurological deficit. Extremities: Symmetric 5 x 5 power. Skin: No rashes, lesions or ulcers    Data Reviewed: I have personally reviewed following labs and imaging studies  CBC:  Recent Labs Lab 07/24/16 2027 07/25/16 0505 07/26/16 0815 07/27/16 0308 07/28/16 0302  WBC 12.5* 14.9* 18.6* 13.6* 18.9*  NEUTROABS  --  13.3*  --   --   --   HGB 14.1 12.0 13.3 12.3 12.2  HCT 45.1 39.9 42.7 38.9 39.6  MCV 63.3* 63.3* 62.2* 62.6* 62.8*  PLT 303 306 304 276 440   Basic Metabolic Panel:  Recent Labs Lab 07/24/16 2027 07/25/16 0505 07/26/16 0815 07/27/16 0308 07/28/16 0302  NA 137 135 132* 137 132*  K 4.6 4.3 3.6 3.3* 4.0  CL 107 103 101 108 103  CO2 24 24 22 22  21*  GLUCOSE 123* 133* 113* 96 144*  BUN 26* 27* 22* 22* 21*  CREATININE 1.09* 1.13* 1.07* 0.97 1.02*  CALCIUM 9.3 8.8* 8.6* 7.6* 7.5*  MG  --  2.1 1.8 1.6* 2.7*  PHOS  --  2.8  --   --   --    GFR: Estimated Creatinine Clearance: 24.7 mL/min (A) (by C-G formula based on SCr of 1.02 mg/dL (H)). Liver Function Tests:  Recent Labs Lab 07/24/16 2027 07/26/16 0219  AST 17  --   ALT 10*  --   ALKPHOS 71  --   BILITOT 0.5  --   PROT 6.7  --   ALBUMIN 4.0 3.6   No results for input(s): LIPASE, AMYLASE in the last 168 hours.  Recent Labs Lab 07/25/16 1134  AMMONIA 15   Coagulation Profile: No results for input(s): INR, PROTIME in the last 168 hours. Cardiac Enzymes: No results for input(s): CKTOTAL, CKMB, CKMBINDEX, TROPONINI in the last 168  hours. BNP (last 3 results) No results for input(s): PROBNP in the last 8760 hours. HbA1C: No results for input(s): HGBA1C in the last 72 hours. CBG:  Recent Labs Lab 07/24/16 2104 07/25/16 1934 07/26/16 1910 07/27/16 0800  GLUCAP 110* 122* 95 85   Lipid Profile: No results for input(s): CHOL, HDL, LDLCALC, TRIG, CHOLHDL, LDLDIRECT in the last 72 hours. Thyroid Function Tests:  Recent Labs  07/25/16 1134 07/25/16 1533  TSH 5.593*  --   FREET4  --  1.12  T3FREE  --  2.2   Anemia Panel:  Recent Labs  07/25/16 1134  VITAMINB12 692   Sepsis Labs: No results for input(s): PROCALCITON, LATICACIDVEN in the last 168 hours.  Recent Results (from the past 240 hour(s))  MRSA PCR Screening     Status: None   Collection Time: 07/25/16 10:45 PM  Result Value Ref Range Status   MRSA by PCR NEGATIVE NEGATIVE Final    Comment:        The GeneXpert MRSA Assay (FDA approved for NASAL specimens only), is one component  of a comprehensive MRSA colonization surveillance program. It is not intended to diagnose MRSA infection nor to guide or monitor treatment for MRSA infections.   Culture, blood (routine x 2)     Status: None (Preliminary result)   Collection Time: 07/26/16 12:10 AM  Result Value Ref Range Status   Specimen Description BLOOD RIGHT HAND  Final   Special Requests   Final    BOTTLES DRAWN AEROBIC AND ANAEROBIC Blood Culture adequate volume   Culture   Final    NO GROWTH 1 DAY Performed at [facility], [place] [address], [place], [place] [address]    Report Status PENDING  Incomplete  Culture, blood (routine x 2)     Status: None (Preliminary result)   Collection Time: 07/26/16 12:10 AM  Result Value Ref Range Status   Specimen Description BLOOD LEFT ARM  Final   Special Requests   Final    BOTTLES DRAWN AEROBIC AND ANAEROBIC Blood Culture adequate volume   Culture   Final    NO GROWTH 1 DAY Performed at [facility], [place] [address]  [place], [place] [address]    Report Status PENDING  Incomplete  CSF culture     Status: None (Preliminary result)   Collection Time: 07/26/16  2:18 AM  Result Value Ref Range Status   Specimen Description CSF  Final   Special Requests Normal  Final   Gram Stain   Final    NO ORGANISMS SEEN RARE WBC SEEN Gram Stain Report Called to,Read Back By and Verified With: HUI AT 0339 ON 06.25.2018 BY NBROOKS    Culture   Final    NO GROWTH 1 DAY Performed at [facility], [place] [address], [place], [place] [address]    Report Status PENDING  Incomplete         Radiology Studies: No results found.      Scheduled Meds: . amLODipine  5 mg Oral Daily  . anagrelide  2 mg Oral BID  . aspirin EC  81 mg Oral Daily  . calcium-vitamin D  2 tablet Oral Q breakfast  . cloNIDine  0.1 mg Oral Once  . colestipol  1 g Oral Daily  . feeding supplement (ENSURE ENLIVE)  237 mL Oral BID BM  . heparin  5,000 Units Subcutaneous Q8H  . hydrochlorothiazide  12.5 mg Oral Daily  . lisinopril  20 mg Oral Daily  . metoprolol succinate  50 mg Oral Daily   Continuous Infusions: . levETIRAcetam 500 mg (07/28/16 1019)     LOS: 2 days    Milli Woolridge Tanna Furry, MD [facility] Pager [phone]  If 7PM-7AM, please contact night-coverage [email or web address] Password [facility] 07/28/2016, 10:55 AM

## 2016-07-28 NOTE — Clinical Social Work Placement (Signed)
   CLINICAL SOCIAL WORK PLACEMENT  NOTE  Date:  07/28/2016  Patient Details  Name: [name] MRN: [id number] Date of Birth: 08-Nov-1926  Clinical Social Work is seeking post-discharge placement for this patient at the [place] level of care (*CSW will initial, date and re-position this form in  chart as items are completed):  Yes   Patient/family provided with [place] Work Department's list of facilities offering this level of care within the geographic area requested by the patient (or if unable, by the patient's family).  Yes   Patient/family informed of their freedom to choose among providers that offer the needed level of care, that participate in Medicare, Medicaid or managed care program needed by the patient, have an available bed and are willing to accept the patient.  Yes   Patient/family informed of [facility]'s ownership interest in [facility] and [facility], as well as of the fact that they are under no obligation to receive care at these facilities.  PASRR submitted to EDS on       PASRR number received on       Existing PASRR number confirmed on 07/28/16     FL2 transmitted to all facilities in geographic area requested by pt/family on 07/28/16     FL2 transmitted to all facilities within larger geographic area on       Patient informed that his/her managed care company has contracts with or will negotiate with certain facilities, including the following:            Patient/family informed of bed offers received.  Patient chooses bed at       Physician recommends and patient chooses bed at      Patient to be transferred to   on  .  Patient to be transferred to facility by       Patient family notified on   of transfer.  Name of family member notified:        PHYSICIAN       Additional Comment:    _______________________________________________ Loraine Maple  [phone] 07/28/2016, 12:57  PM

## 2016-07-28 NOTE — Progress Notes (Signed)
NEURO HOSPITALIST PROGRESS NOTE   Requestig physician: Dr. Carolin Sicks    HPI:                                                                                                                                          [name] is an 83 y.o. female who presented to the [facility] ED late Saturday evening with a chief complaint of confusion with difficulty speaking. BP was severely elevated in the ED with initial reading of 212/99. Also had leukocytosis of 12.5 in the ED which further increased to 14.9 by 0505 on Sunday. Has a significant hx of polycythemia and chronic leukocytosis?   Past Medical History:  Diagnosis Date  . Acute blood loss anemia   . Arthritis   . Atrophic gastritis   . Breast cancer ([place])    right  . Candida esophagitis ([place])   . Chest tightness   . Chronic gastritis   . Closed intertrochanteric fracture of left femur with routine healing   . Diverticulosis   . Hypertension   . IBS (irritable bowel syndrome)   . MVP (mitral valve prolapse)   . Palpitation   . Polycythemia   . PVC's (premature ventricular contractions)   . Rhinitis   . Unsteady gait     Past Surgical History:  Procedure Laterality Date  . ABDOMINAL HYSTERECTOMY     ovaries taken  . APPENDECTOMY    . BREAST LUMPECTOMY     right  . CARDIOVASCULAR STRESS TEST  12/23/2000   EF 70%  . CATARACT EXTRACTION     bilateral  . ELBOW FRACTURE SURGERY     left  . FEMUR IM NAIL Left 05/08/2015   Procedure: INTERTROCHANTERIC FEMORAL NAIL;  Surgeon: Rod Can, MD;  Location: [facility];  Service: Orthopedics;  Laterality: Left;  . HEMATOMA EVACUATION     right  . TONSILLECTOMY    . TRANSTHORACIC ECHOCARDIOGRAM  03/05/2010   EF 55-60%    Family History  Problem Relation Age of Onset  . Stomach cancer Mother   . Hypertension Father   . Stroke Father   . Prostate cancer Father   . Hypertension Sister   . Hypertension Brother   . Prostate cancer Brother   . Diabetes Brother   .  Colon cancer Neg Hx       Social History:  reports that she quit smoking about 35 years ago. She has never used smokeless tobacco. She reports that she drinks alcohol. She reports that she does not use drugs.  Allergies  Allergen Reactions  . Sulfur Other (See Comments)    Pt doesn't remember reaction.    MEDICATIONS:  I have reviewed the patient's current medications.   ROS:                                                                                                                                       History obtained from the patient  General ROS: negative for - chills, fatigue, fever, night sweats, weight gain or weight loss    Blood pressure (!) 150/52, pulse 91, temperature 98.1 F (36.7 C), temperature source Oral, resp. rate 16, height 5\' 2"  (1.575 m), weight 41.1 kg (90 lb 9.6 oz), SpO2 97 %.   Neurologic Examination:                                                                                                      HEENT-  Normocephalic, no lesions, without obvious abnormality.  Normal external eye and conjunctiva.  Normal TM's bilaterally.  Normal auditory canals and external ears. Normal external nose, mucus membranes and septum.  Normal pharynx. Cardiovascular- regular rate and rhythm, S1, S2 normal, no murmur, click, rub or gallop, pulses palpable throughout   Lungs- chest clear, no wheezing, rales, normal symmetric air entry, Heart exam - S1, S2 normal, no murmur, no gallop, rate regular Abdomen- soft, non-tender; bowel sounds normal; no masses,  no organomegaly Extremities- no edema   Neurological Examination Mental Status: Alert, oriented, thought content appropriate.  Speech fluent without evidence of aphasia.  Able to follow commands without difficulty. Cranial Nerves: II: Discs flat bilaterally; Visual fields grossly normal,  III,IV, VI:  ptosis not present, extra-ocular motions intact bilaterally pupils equal, round, reactive to light and accommodation V,VII: smile symmetric, facial light touch sensation normal bilaterally VIII: hearing normal bilaterally IX,X: uvula rises symmetrically XI: bilateral shoulder shrug XII: midline tongue extension Motor: Right : Upper extremity   5/5    Left:     Upper extremity   5/5  Lower extremity   5/5     Lower extremity   5/5 Tone and bulk:normal tone throughout; no atrophy noted Sensory: Pinprick and light touch intact throughout, bilaterally Cerebellar: normal finger-to-nose, normal rapid alternating movements and normal heel-to-shin test       Lab Results: Basic Metabolic Panel:  Recent Labs Lab 07/24/16 2027 07/25/16 0505 07/26/16 0815 07/27/16 0308 07/28/16 0302  NA 137 135 132* 137 132*  K 4.6 4.3 3.6 3.3* 4.0  CL 107 103 101 108 103  CO2 24 24 22 22  21*  GLUCOSE 123* 133* 113* 96 144*  BUN 26*  27* 22* 22* 21*  CREATININE 1.09* 1.13* 1.07* 0.97 1.02*  CALCIUM 9.3 8.8* 8.6* 7.6* 7.5*  MG  --  2.1 1.8 1.6* 2.7*  PHOS  --  2.8  --   --   --     Liver Function Tests:  Recent Labs Lab 07/24/16 2027 07/26/16 0219  AST 17  --   ALT 10*  --   ALKPHOS 71  --   BILITOT 0.5  --   PROT 6.7  --   ALBUMIN 4.0 3.6   No results for input(s): LIPASE, AMYLASE in the last 168 hours.  Recent Labs Lab 07/25/16 1134  AMMONIA 15    CBC:  Recent Labs Lab 07/24/16 2027 07/25/16 0505 07/26/16 0815 07/27/16 0308 07/28/16 0302  WBC 12.5* 14.9* 18.6* 13.6* 18.9*  NEUTROABS  --  13.3*  --   --   --   HGB 14.1 12.0 13.3 12.3 12.2  HCT 45.1 39.9 42.7 38.9 39.6  MCV 63.3* 63.3* 62.2* 62.6* 62.8*  PLT 303 306 304 276 367    Cardiac Enzymes: No results for input(s): CKTOTAL, CKMB, CKMBINDEX, TROPONINI in the last 168 hours.  Lipid Panel:  Recent Labs Lab 07/25/16 0600  CHOL 131  TRIG 101  HDL 46  CHOLHDL 2.8  VLDL 20  LDLCALC 65    CBG:  Recent  Labs Lab 07/24/16 2104 07/25/16 1934 07/26/16 1910 07/27/16 0800  GLUCAP 110* 122* 95 65    Microbiology: Results for orders placed or performed during the hospital encounter of 07/24/16  MRSA PCR Screening     Status: None   Collection Time: 07/25/16 10:45 PM  Result Value Ref Range Status   MRSA by PCR NEGATIVE NEGATIVE Final    Comment:        The GeneXpert MRSA Assay (FDA approved for NASAL specimens only), is one component of a comprehensive MRSA colonization surveillance program. It is not intended to diagnose MRSA infection nor to guide or monitor treatment for MRSA infections.   Culture, blood (routine x 2)     Status: None (Preliminary result)   Collection Time: 07/26/16 12:10 AM  Result Value Ref Range Status   Specimen Description BLOOD RIGHT HAND  Final   Special Requests   Final    BOTTLES DRAWN AEROBIC AND ANAEROBIC Blood Culture adequate volume   Culture   Final    NO GROWTH 2 DAYS Performed at [facility], [address], [place], [place] [address]    Report Status PENDING  Incomplete  Culture, blood (routine x 2)     Status: None (Preliminary result)   Collection Time: 07/26/16 12:10 AM  Result Value Ref Range Status   Specimen Description BLOOD LEFT ARM  Final   Special Requests   Final    BOTTLES DRAWN AEROBIC AND ANAEROBIC Blood Culture adequate volume   Culture   Final    NO GROWTH 2 DAYS Performed at [facility], [address] [place], [place] [address]    Report Status PENDING  Incomplete  CSF culture     Status: None (Preliminary result)   Collection Time: 07/26/16  2:18 AM  Result Value Ref Range Status   Specimen Description CSF  Final   Special Requests Normal  Final   Gram Stain   Final    NO ORGANISMS SEEN RARE WBC SEEN Gram Stain Report Called to,Read Back By and Verified With: HUI AT 0339 ON 06.25.2018 BY NBROOKS    Culture  Final    NO GROWTH 2 DAYS Performed at [facility], [place] [address],  [place], [place] [address]    Report Status PENDING  Incomplete    Coagulation Studies: No results for input(s): LABPROT, INR in the last 72 hours.  Imaging: No results found.     Assessment/Plan: 83 y.o. female who presented to the [facility] ED late Saturday evening with a chief complaint of confusion with difficulty speaking. BP was severely elevated in the ED with initial reading of 212/99. Also had leukocytosis of 12.5 in the ED which further increased to 14.9 by 0505 on Sunday. Has a significant hx of polycythemia and chronic leukocytosis?  Had a witnessed event in which she was starring and unresponsive for a few minutes followed by some generalized slight shaking as per nurse report  EEG negative for any epileptiform discharges  LP negative for any CNS infectious process. Stop abx as per ID  CTH shows moderate to severe atrophy  Most likely a result of Hypertensive urgency vs Seizure  Due to atrophy notes on CTH, puts her at high risk for decompensation from a mental status every time her body is under systemic stress, for example hypertensive crisis/urgency, UTI etc. Also given all that atrophy and microvascular changes lowers her seizure threshold  Rec's  - Would tightly control her BP SBP < 140 - Continue her Keppra 500mg  BID and can follow up with Neurology as outpatient.  Please call back with any questions. This plan was also discussed with her primary caretaker, her daughter [name]

## 2016-07-29 ENCOUNTER — Other Ambulatory Visit: Payer: Self-pay | Admitting: Cardiovascular Disease

## 2016-07-29 ENCOUNTER — Telehealth: Payer: Self-pay

## 2016-07-29 DIAGNOSIS — R1319 Other dysphagia: Secondary | ICD-10-CM | POA: Diagnosis not present

## 2016-07-29 DIAGNOSIS — M6281 Muscle weakness (generalized): Secondary | ICD-10-CM | POA: Diagnosis not present

## 2016-07-29 DIAGNOSIS — G9340 Encephalopathy, unspecified: Secondary | ICD-10-CM | POA: Diagnosis not present

## 2016-07-29 DIAGNOSIS — R278 Other lack of coordination: Secondary | ICD-10-CM | POA: Diagnosis not present

## 2016-07-29 DIAGNOSIS — R41841 Cognitive communication deficit: Secondary | ICD-10-CM | POA: Diagnosis not present

## 2016-07-29 DIAGNOSIS — R2681 Unsteadiness on feet: Secondary | ICD-10-CM | POA: Diagnosis not present

## 2016-07-29 DIAGNOSIS — M1990 Unspecified osteoarthritis, unspecified site: Secondary | ICD-10-CM | POA: Diagnosis not present

## 2016-07-29 DIAGNOSIS — I10 Essential (primary) hypertension: Secondary | ICD-10-CM | POA: Diagnosis not present

## 2016-07-29 DIAGNOSIS — I160 Hypertensive urgency: Secondary | ICD-10-CM | POA: Diagnosis not present

## 2016-07-29 DIAGNOSIS — R569 Unspecified convulsions: Secondary | ICD-10-CM | POA: Diagnosis not present

## 2016-07-29 DIAGNOSIS — D72829 Elevated white blood cell count, unspecified: Secondary | ICD-10-CM | POA: Diagnosis not present

## 2016-07-29 LAB — CSF CULTURE W GRAM STAIN
Culture: NO GROWTH
Gram Stain: NONE SEEN

## 2016-07-29 LAB — BASIC METABOLIC PANEL
Anion gap: 10 (ref 5–15)
BUN: 18 mg/dL (ref 6–20)
CALCIUM: 7.7 mg/dL — AB (ref 8.9–10.3)
CHLORIDE: 103 mmol/L (ref 101–111)
CO2: 22 mmol/L (ref 22–32)
CREATININE: 0.87 mg/dL (ref 0.44–1.00)
GFR calc non Af Amer: 58 mL/min — ABNORMAL LOW (ref >60)
Glucose, Bld: 101 mg/dL — ABNORMAL HIGH (ref 65–99)
Potassium: 3.6 mmol/L (ref 3.5–5.1)
SODIUM: 135 mmol/L (ref 135–145)

## 2016-07-29 LAB — CBC
HCT: 38.2 % (ref 36.0–46.0)
HEMOGLOBIN: 12.2 g/dL (ref 12.0–15.0)
MCH: 19.8 pg — ABNORMAL LOW (ref 26.0–34.0)
MCHC: 31.9 g/dL (ref 30.0–36.0)
MCV: 62 fL — ABNORMAL LOW (ref 78.0–100.0)
Platelets: 493 10*3/uL — ABNORMAL HIGH (ref 150–400)
RBC: 6.16 MIL/uL — ABNORMAL HIGH (ref 3.87–5.11)
RDW: 20.4 % — ABNORMAL HIGH (ref 11.5–15.5)
WBC: 14.9 10*3/uL — AB (ref 4.0–10.5)

## 2016-07-29 LAB — CSF CULTURE: SPECIAL REQUESTS: NORMAL

## 2016-07-29 MED ORDER — FUROSEMIDE 20 MG PO TABS: 20.0000 mg | ORAL_TABLET | Freq: Every day | ORAL | 0 refills | Status: DC

## 2016-07-29 MED ORDER — LEVETIRACETAM 500 MG PO TABS: 500.0000 mg | ORAL_TABLET | Freq: Two times a day (BID) | ORAL | 0 refills | Status: DC

## 2016-07-29 MED ORDER — AMLODIPINE BESYLATE 5 MG PO TABS: 5.0000 mg | ORAL_TABLET | Freq: Every day | ORAL | 0 refills | Status: DC

## 2016-07-29 MED ORDER — LEVETIRACETAM 500 MG PO TABS
500.0000 mg | ORAL_TABLET | Freq: Two times a day (BID) | ORAL | Status: DC
  Administered 2016-07-29: 500 mg via ORAL
  Filled 2016-07-29: qty 1

## 2016-07-29 NOTE — Clinical Social Work Placement (Signed)
   CLINICAL SOCIAL WORK PLACEMENT  NOTE  Date:  07/29/2016  Patient Details  Name: [name] MRN: [id number] Date of Birth: November 17, 1926  Clinical Social Work is seeking post-discharge placement for this patient at the [place] level of care (*CSW will initial, date and re-position this form in  chart as items are completed):  Yes   Patient/family provided with [place] Work Department's list of facilities offering this level of care within the geographic area requested by the patient (or if unable, by the patient's family).  Yes   Patient/family informed of their freedom to choose among providers that offer the needed level of care, that participate in Medicare, Medicaid or managed care program needed by the patient, have an available bed and are willing to accept the patient.  Yes   Patient/family informed of [facility]'s ownership interest in [facility] and [facility], as well as of the fact that they are under no obligation to receive care at these facilities.  PASRR submitted to EDS on       PASRR number received on       Existing PASRR number confirmed on 07/28/16     FL2 transmitted to all facilities in geographic area requested by pt/family on 07/28/16     FL2 transmitted to all facilities within larger geographic area on       Patient informed that his/her managed care company has contracts with or will negotiate with certain facilities, including the following:        Yes   Patient/family informed of bed offers received.  Patient chooses bed at [facility]     Physician recommends and patient chooses bed at      Patient to be transferred to [facility] on 07/29/16.  Patient to be transferred to facility by [facility]     Patient family notified on 07/29/16 of transfer.  Name of family member notified:  daughter     PHYSICIAN       Additional Comment: Pt / family are in agreement with dc to [facility] today. Family  requesting to transport by car. DC summary sent to SNF for review. No scripts printed. # for report provided to nsg.   _______________________________________________ Luretha Rued, [place]  [phone] 07/29/2016, 3:59 PM

## 2016-07-29 NOTE — Progress Notes (Signed)
Patient is being d/ced to [facility].  Son will transport after 1pm.  [place] [address]

## 2016-07-29 NOTE — Progress Notes (Signed)
Pt has a SNF bed at [facility] for today if stable for d/c. CSW will assist with d/c planning to SNF.  Werner Lean LCSW [phone]

## 2016-07-29 NOTE — Discharge Summary (Signed)
Physician Discharge Summary  [name] LSL:[id number] DOB: 05-01-1926 DOA: 07/24/2016  PCP: Deland Pretty, MD  Admit date: 07/24/2016 Discharge date: 07/29/2016  Admitted From:home Disposition:SNF  Recommendations for Outpatient Follow-up:  1. Follow up with PCP in 1-2 weeks 2. Please obtain BMP/CBC in one week 3. Please follow-up with neurologist in 3-4 weeks 4. Please check thyroid function test with her PCP in 4 weeks   Home Health:SNF Equipment/Devices:none Discharge Condition:stable CODE STATUS:full code Diet recommendation:heart healthy  Brief/Interim Summary: 83 year old female with history of osteoarthritis, history of breast cancer, diverticulosis, hypertension, mitral valve prolapse, polycythemia follows up with oncologist, presented to the hospital on 6/24 with trouble finding words and possible confusion. In the ER patient was found to have blood pressure of 212/99 with chronic leukocytosis. CT scan of the head unremarkable. MRI of the brain showed no acute distress. In the evening patient become febrile and worsening mental status. LP was done and started on empiric antibiotics for possible meningoencephalitis. Patient was moved to the stepdown unit.  # Altered mental status/word finding difficulty/ acute encephalopathy likely due to seizure disorder. The differentials include toxic/metabolic etiology. Thought to be due to meningoencephalitis but lumbar puncture result consistent with no infection. Initially treated with broad spectrum antibiotics including ampicillin, ceftriaxone, vancomycin and acyclovir. The antibiotics were discontinued by Dr. Bridget Hartshorn from infectious disease. Patient is clinically improved. Mental status improved. Started on [place] by neurologist. Cultures negative so far. Evaluated by PT OT recommended a skilled nursing facility. -Patient is medically stable, mental status improved, cultures negative so far. Ambulatory referral to neurologist made.  Patient will be transferred to a skilled nursing facility to continue her care.  -MRI with advanced atrophy and chronic microvascular ischemic changes. There is also right subfrontal extra-axial mass without edema likely meningioma. As per neurosurgery consult, no indication for treatment, follow-up imaging only if new symptoms appear.  -monitor fever curve, leukocytosis.  -Vitamin B12 and ammonia level acceptable.  # Hypertensive urgency on admission:  -Monitor blood pressure. The dose of Norvasc increased. Started on Lasix. Discontinue hydrochlorothiazide because of hyponatremia. Monitor blood pressure. Patient is on multiple antihypertensive medications.  # Polycythemia vera:  -gets outpatient schedule phlebotomies.  -Monitor CBC closely. Fluctuation in white cell counts.  # Mild hyponatremia: Serum sodium level improved. Off thiazide. Monitor BMP.  #TSH level elevated. Free T4 normal. Follow-up free T3 level. Need to follow up with PCP and repeat lab in 4 weeks.  Patient with significant clinical improvement. She is alert awake and oriented 3. No problem with his speech. No focal neurological deficit. I discussed with the patient, her son and the care management team today. She will be discharged to skilled nursing facility in stable condition. Recommended to follow up with PCP and neurologist outpatient.  Discharge Diagnoses:  Principal Problem:   Acute encephalopathy Active Problems:   Polycythemia vera (HCC)   Leukocytosis   Thrombocytosis (HCC)   Hypertensive urgency   Protein-calorie malnutrition, severe   Seizure ([place])   Fever    Discharge Instructions  Discharge Instructions    Ambulatory referral to Neurology    Complete by:  As directed    An appointment is requested in approximately: 4 weeks   Call MD for:  difficulty breathing, headache or visual disturbances    Complete by:  As directed    Call MD for:  extreme fatigue    Complete by:  As directed     Call MD for:  hives    Complete by:  As  directed    Call MD for:  persistant dizziness or light-headedness    Complete by:  As directed    Call MD for:  persistant nausea and vomiting    Complete by:  As directed    Call MD for:  severe uncontrolled pain    Complete by:  As directed    Call MD for:  temperature >100.4    Complete by:  As directed    Diet - low sodium heart healthy    Complete by:  As directed    Discharge instructions    Complete by:  As directed    Please check labs CBC BMP in a week with PCP.  Please check thyroid function test with her PCP in 4 weeks.   Increase activity slowly    Complete by:  As directed      Allergies as of 07/29/2016      Reactions   Sulfur Other (See Comments)   Pt doesn't remember reaction.      Medication List    STOP taking these medications   hydrochlorothiazide 12.5 MG capsule Commonly known as:  MICROZIDE     TAKE these medications   amLODipine 5 MG tablet Commonly known as:  NORVASC Take 1 tablet (5 mg total) by mouth daily. What changed:  medication strength  how much to take   anagrelide 1 MG capsule Commonly known as:  AGRYLIN Take 2 capsules (2 mg total) by mouth 2 (two) times daily.   aspirin 81 MG tablet Take 81 mg by mouth daily.   CALCIUM 500/D PO Take 1 capsule by mouth daily.   colestipol 1 g tablet Commonly known as:  COLESTID Take 1-2 tablets (1-2 g total) by mouth daily. Reported on 05/14/2015 What changed:  how much to take  additional instructions   fluticasone 50 MCG/ACT nasal spray Commonly known as:  FLONASE Place 2 sprays into the nose as needed for allergies or rhinitis. Reported on 06/11/2015   furosemide 20 MG tablet Commonly known as:  LASIX Take 1 tablet (20 mg total) by mouth daily. Start taking on:  07/30/2016   levETIRAcetam 500 MG tablet Commonly known as:  KEPPRA Take 1 tablet (500 mg total) by mouth 2 (two) times daily.   lisinopril 20 MG tablet Commonly known as:   PRINIVIL,ZESTRIL Take 20 mg by mouth daily.   loperamide 2 MG capsule Commonly known as:  IMODIUM Take 2 mg by mouth as needed for diarrhea or loose stools.   metoprolol succinate 50 MG 24 hr tablet Commonly known as:  TOPROL-XL TAKE 1 TABLET ONCE DAILY WITH FOOD.   pantoprazole 40 MG tablet Commonly known as:  PROTONIX Take 40 mg by mouth as needed (AS NEEDED FOR STOMACH).   potassium chloride 10 MEQ tablet Commonly known as:  K-DUR TAKE 1 TABLET ONCE DAILY.   ULTRAFLORA IMMUNE HEALTH PO Take 1 tablet by mouth daily. Reported on 07/01/2015   ZYRTEC PO Take 1 tablet by mouth as needed (AS NEEDED FOR ALLERGIES).      Follow-up Information    Deland Pretty, MD. Schedule an appointment as soon as possible for a visit in 1 week(s).   Specialty:  Internal Medicine Contact information: [place] [address] [phone]          Allergies  Allergen Reactions  . Sulfur Other (See Comments)    Pt doesn't remember reaction.    Consultations: Neurology Infectious disease Neurosurgery  Procedures/Studies: MRI brain, lumbar puncture  Subjective: Seen and examined at bedside. Patient is alert awake, sitting on chair comfortable. Denied headache, dizziness, nausea vomiting chest pain or shortness of breath.  Discharge Exam: Vitals:   07/28/16 2201 07/29/16 0429  BP: (!) 170/71 (!) 150/75  Pulse: 80 86  Resp: 16 18  Temp: 97.6 F (36.4 C) 98.4 F (36.9 C)   Vitals:   07/28/16 1448 07/28/16 1520 07/28/16 2201 07/29/16 0429  BP: (!) 150/52  (!) 170/71 (!) 150/75  Pulse:  91 80 86  Resp:  16 16 18   Temp:  98.1 F (36.7 C) 97.6 F (36.4 C) 98.4 F (36.9 C)  TempSrc:  Oral Oral Oral  SpO2:  97% 97% 96%  Weight:      Height:        General: Pt is alert, awake, not in acute distress Cardiovascular: RRR, S1/S2 +, no rubs, no gallops Respiratory: CTA bilaterally, no wheezing, no rhonchi Abdominal: Soft, NT, ND, bowel sounds  + Extremities: no edema, no cyanosis Neurology: Fluent speech, muscle strength 5 over 5 in all extremities, oriented   The results of significant diagnostics from this hospitalization (including imaging, microbiology, ancillary and laboratory) are listed below for reference.     Microbiology: Recent Results (from the past 240 hour(s))  MRSA PCR Screening     Status: None   Collection Time: 07/25/16 10:45 PM  Result Value Ref Range Status   MRSA by PCR NEGATIVE NEGATIVE Final    Comment:        The GeneXpert MRSA Assay (FDA approved for NASAL specimens only), is one component of a comprehensive MRSA colonization surveillance program. It is not intended to diagnose MRSA infection nor to guide or monitor treatment for MRSA infections.   Culture, blood (routine x 2)     Status: None (Preliminary result)   Collection Time: 07/26/16 12:10 AM  Result Value Ref Range Status   Specimen Description BLOOD RIGHT HAND  Final   Special Requests   Final    BOTTLES DRAWN AEROBIC AND ANAEROBIC Blood Culture adequate volume   Culture   Final    NO GROWTH 2 DAYS Performed at [facility], [address], [place], [place] [address]    Report Status PENDING  Incomplete  Culture, blood (routine x 2)     Status: None (Preliminary result)   Collection Time: 07/26/16 12:10 AM  Result Value Ref Range Status   Specimen Description BLOOD LEFT ARM  Final   Special Requests   Final    BOTTLES DRAWN AEROBIC AND ANAEROBIC Blood Culture adequate volume   Culture   Final    NO GROWTH 2 DAYS Performed at [facility], [address], [place], [place] [address]    Report Status PENDING  Incomplete  CSF culture     Status: None   Collection Time: 07/26/16  2:18 AM  Result Value Ref Range Status   Specimen Description CSF  Final   Special Requests Normal  Final   Gram Stain   Final    NO ORGANISMS SEEN RARE WBC SEEN Gram Stain Report Called to,Read Back By and Verified With: HUI AT 0339  ON 06.25.2018 BY NBROOKS    Culture   Final    NO GROWTH 3 DAYS Performed at [facility], [place] [address] [place],  [address]    Report Status 07/29/2016 FINAL  Final     Labs: BNP (last 3 results)  Recent Labs  07/25/16 0505  BNP 272.8*  Basic Metabolic Panel:  Recent Labs Lab 07/25/16 0505 07/26/16 0815 07/27/16 0308 07/28/16 0302 07/29/16 0513  NA 135 132* 137 132* 135  K 4.3 3.6 3.3* 4.0 3.6  CL 103 101 108 103 103  CO2 24 22 22  21* 22  GLUCOSE 133* 113* 96 144* 101*  BUN 27* 22* 22* 21* 18  CREATININE 1.13* 1.07* 0.97 1.02* 0.87  CALCIUM 8.8* 8.6* 7.6* 7.5* 7.7*  MG 2.1 1.8 1.6* 2.7*  --   PHOS 2.8  --   --   --   --    Liver Function Tests:  Recent Labs Lab 07/24/16 2027 07/26/16 0219  AST 17  --   ALT 10*  --   ALKPHOS 71  --   BILITOT 0.5  --   PROT 6.7  --   ALBUMIN 4.0 3.6   No results for input(s): LIPASE, AMYLASE in the last 168 hours.  Recent Labs Lab 07/25/16 1134  AMMONIA 15   CBC:  Recent Labs Lab 07/25/16 0505 07/26/16 0815 07/27/16 0308 07/28/16 0302 07/29/16 0513  WBC 14.9* 18.6* 13.6* 18.9* 14.9*  NEUTROABS 13.3*  --   --   --   --   HGB 12.0 13.3 12.3 12.2 12.2  HCT 39.9 42.7 38.9 39.6 38.2  MCV 63.3* 62.2* 62.6* 62.8* 62.0*  PLT 306 304 276 367 493*   Cardiac Enzymes: No results for input(s): CKTOTAL, CKMB, CKMBINDEX, TROPONINI in the last 168 hours. BNP: Invalid input(s): POCBNP CBG:  Recent Labs Lab 07/24/16 2104 07/25/16 1934 07/26/16 1910 07/27/16 0800  GLUCAP 110* 122* 95 85   D-Dimer No results for input(s): DDIMER in the last 72 hours. Hgb A1c No results for input(s): HGBA1C in the last 72 hours. Lipid Profile No results for input(s): CHOL, HDL, LDLCALC, TRIG, CHOLHDL, LDLDIRECT in the last 72 hours. Thyroid function studies No results for input(s): TSH, T4TOTAL, T3FREE, THYROIDAB in the last 72 hours.  Invalid input(s): FREET3 Anemia work up No results for input(s):  VITAMINB12, FOLATE, FERRITIN, TIBC, IRON, RETICCTPCT in the last 72 hours. Urinalysis    Component Value Date/Time   COLORURINE STRAW (A) 07/26/2016 0033   APPEARANCEUR CLEAR 07/26/2016 0033   LABSPEC 1.010 07/26/2016 0033   PHURINE 7.0 07/26/2016 0033   GLUCOSEU NEGATIVE 07/26/2016 0033   HGBUR NEGATIVE 07/26/2016 0033   BILIRUBINUR NEGATIVE 07/26/2016 0033   KETONESUR 5 (A) 07/26/2016 0033   PROTEINUR NEGATIVE 07/26/2016 0033   UROBILINOGEN 0.2 08/27/2006 2231   NITRITE NEGATIVE 07/26/2016 0033   LEUKOCYTESUR NEGATIVE 07/26/2016 0033   Sepsis Labs Invalid input(s): PROCALCITONIN,  WBC,  LACTICIDVEN Microbiology Recent Results (from the past 240 hour(s))  MRSA PCR Screening     Status: None   Collection Time: 07/25/16 10:45 PM  Result Value Ref Range Status   MRSA by PCR NEGATIVE NEGATIVE Final    Comment:        The GeneXpert MRSA Assay (FDA approved for NASAL specimens only), is one component of a comprehensive MRSA colonization surveillance program. It is not intended to diagnose MRSA infection nor to guide or monitor treatment for MRSA infections.   Culture, blood (routine x 2)     Status: None (Preliminary result)   Collection Time: 07/26/16 12:10 AM  Result Value Ref Range Status   Specimen Description BLOOD RIGHT HAND  Final   Special Requests   Final    BOTTLES DRAWN AEROBIC AND ANAEROBIC Blood Culture adequate volume   Culture   Final    NO GROWTH  2 DAYS Performed at [facility], [place] [address], [place], [place] [address]    Report Status PENDING  Incomplete  Culture, blood (routine x 2)     Status: None (Preliminary result)   Collection Time: 07/26/16 12:10 AM  Result Value Ref Range Status   Specimen Description BLOOD LEFT ARM  Final   Special Requests   Final    BOTTLES DRAWN AEROBIC AND ANAEROBIC Blood Culture adequate volume   Culture   Final    NO GROWTH 2 DAYS Performed at [facility], [address], [place], [place] [address]     Report Status PENDING  Incomplete  CSF culture     Status: None   Collection Time: 07/26/16  2:18 AM  Result Value Ref Range Status   Specimen Description CSF  Final   Special Requests Normal  Final   Gram Stain   Final    NO ORGANISMS SEEN RARE WBC SEEN Gram Stain Report Called to,Read Back By and Verified With: HUI AT 0339 ON 06.25.2018 BY NBROOKS    Culture   Final    NO GROWTH 3 DAYS Performed at [facility], [place] [address], [place], [place] [address]    Report Status 07/29/2016 FINAL  Final     Time coordinating discharge: 34 minutes  SIGNED:   Rosita Fire, MD  [facility] 07/29/2016, 11:26 AM  If 7PM-7AM, please contact night-coverage [email or web address] Password [email or web address]

## 2016-07-29 NOTE — Telephone Encounter (Signed)
Daughter came b/c pt is being released to [facility] from hospital today. They are concerned that she was supposed to have a phlebotomy yesterday.  Per Dr Burr Medico OV note phlebotomy is for when Hct >= to 45%. Pt hct currently 38.2 so phlebotomy is not necessary at this time. Explained that while pt is at [facility], if labs are done and pt needs a phlebotomy, [facility] will arrange transportation to [facility].

## 2016-07-29 NOTE — Care Management Note (Signed)
Case Management Note  Patient Details  Name: [name] MRN: [id number] Date of Birth: 05-04-1926  Subjective/Objective:                    Action/Plan:dc SNF.   Expected Discharge Date:  07/29/16               Expected Discharge Plan:  Skilled Nursing Facility  In-House Referral:  Clinical Social Work  Discharge planning Services  CM Consult  Post Acute Care Choice:    Choice offered to:     DME Arranged:    DME Agency:     HH Arranged:    [place] Agency:     Status of Service:  Completed, signed off  If discussed at [employer], dates discussed:    Additional Comments:  Dessa Phi, RN 07/29/2016, 11:30 AM

## 2016-07-29 NOTE — Progress Notes (Signed)
Report given to RN at [facility].  Patients son will be transporting her to [facility]

## 2016-07-30 DIAGNOSIS — I10 Essential (primary) hypertension: Secondary | ICD-10-CM | POA: Diagnosis not present

## 2016-07-30 DIAGNOSIS — R569 Unspecified convulsions: Secondary | ICD-10-CM | POA: Diagnosis not present

## 2016-07-31 LAB — CULTURE, BLOOD (ROUTINE X 2)
CULTURE: NO GROWTH
Culture: NO GROWTH
Special Requests: ADEQUATE
Special Requests: ADEQUATE

## 2016-08-02 DIAGNOSIS — I10 Essential (primary) hypertension: Secondary | ICD-10-CM | POA: Diagnosis not present

## 2016-08-02 DIAGNOSIS — R569 Unspecified convulsions: Secondary | ICD-10-CM | POA: Diagnosis not present

## 2016-08-02 DIAGNOSIS — G9340 Encephalopathy, unspecified: Secondary | ICD-10-CM | POA: Diagnosis not present

## 2016-08-11 DIAGNOSIS — R569 Unspecified convulsions: Secondary | ICD-10-CM | POA: Diagnosis not present

## 2016-08-16 ENCOUNTER — Telehealth: Payer: Self-pay | Admitting: Cardiovascular Disease

## 2016-08-16 DIAGNOSIS — Z7902 Long term (current) use of antithrombotics/antiplatelets: Secondary | ICD-10-CM | POA: Diagnosis not present

## 2016-08-16 DIAGNOSIS — D45 Polycythemia vera: Secondary | ICD-10-CM | POA: Diagnosis not present

## 2016-08-16 DIAGNOSIS — M810 Age-related osteoporosis without current pathological fracture: Secondary | ICD-10-CM | POA: Diagnosis not present

## 2016-08-16 DIAGNOSIS — Z7982 Long term (current) use of aspirin: Secondary | ICD-10-CM | POA: Diagnosis not present

## 2016-08-16 DIAGNOSIS — Z87310 Personal history of (healed) osteoporosis fracture: Secondary | ICD-10-CM | POA: Diagnosis not present

## 2016-08-16 DIAGNOSIS — I10 Essential (primary) hypertension: Secondary | ICD-10-CM | POA: Diagnosis not present

## 2016-08-16 DIAGNOSIS — M1990 Unspecified osteoarthritis, unspecified site: Secondary | ICD-10-CM | POA: Diagnosis not present

## 2016-08-16 DIAGNOSIS — Z853 Personal history of malignant neoplasm of breast: Secondary | ICD-10-CM | POA: Diagnosis not present

## 2016-08-16 DIAGNOSIS — Z9181 History of falling: Secondary | ICD-10-CM | POA: Diagnosis not present

## 2016-08-16 NOTE — Telephone Encounter (Signed)
Walk in Pt Form-patients daughter is requesting call back. Placed in [employer]

## 2016-08-17 NOTE — Addendum Note (Signed)
Addended by: Emmaline Life on: 08/17/2016 01:25 PM   Modules accepted: Orders

## 2016-08-17 NOTE — Telephone Encounter (Signed)
Spoke with patient's daughter, [name], who called to discuss her mother's medications as she is primary caretaker. She denies complaints, just states she is overwhelmed with making certain that with so many physicians the patient is taking the correct medications. I reviewed the list from epic with her and she states the patient is no longer taking furosemide or any other diuretic. We discussed the actions of her medications and patient's BP and pulse. She states she is having patient record these 2 times per day but does not have the readings with her. After further discussion, I offered her an appointment with Dr. Acie Fredrickson on Thursday as she is due for 6 month f/u. She verbalized understanding and agreement with plan and thanked me for the call.

## 2016-08-19 ENCOUNTER — Ambulatory Visit (INDEPENDENT_AMBULATORY_CARE_PROVIDER_SITE_OTHER): Payer: Medicare Other | Admitting: Cardiovascular Disease

## 2016-08-19 ENCOUNTER — Encounter: Payer: Self-pay | Admitting: Cardiovascular Disease

## 2016-08-19 VITALS — BP 158/70 | HR 84 | Ht 62.0 in | Wt 93.4 lb

## 2016-08-19 DIAGNOSIS — I341 Nonrheumatic mitral (valve) prolapse: Secondary | ICD-10-CM

## 2016-08-19 DIAGNOSIS — I10 Essential (primary) hypertension: Secondary | ICD-10-CM

## 2016-08-19 MED ORDER — OLMESARTAN MEDOXOMIL 20 MG PO TABS: 20.0000 mg | ORAL_TABLET | Freq: Every day | ORAL | 3 refills | Status: DC

## 2016-08-19 NOTE — Patient Instructions (Signed)
Medication Instructions:  STOP Amlodipine STOP Lisinopril START Olmesartan (Benicar) 20 mg once daily   Labwork: Your physician recommends that you schedule a follow-up appointment in: 3 weeks for basic metabolic panel   Testing/Procedures: None Ordered   Follow-Up: Your physician recommends that you schedule a follow-up appointment in: 3 months with Dr. Acie Fredrickson   If you need a refill on your cardiac medications before your next appointment, please call your pharmacy.   Thank you for choosing [facility]! Christen Bame, RN [phone]

## 2016-08-19 NOTE — Progress Notes (Signed)
[name] Date of Birth  12/03/1926 [place] [address]    [address] [name], [name]  [id number]    [place], [name]  [address] [phone]  Fax  [phone]  [phone]  Fax [phone]   History of Present Illness:  Problem list: 1. Mitral valve prolapse 2. Breast cancer 3. Polycythemia 4. Hypertension  [name] is an 83 year old female with a history of mitral valve prolapse, hypertension, and polycythemia. She has phlebotomy  about once a month for her polycythemia. Her blood pressure typically is little bit elevated and then goes down quickly after her phlebotomy. She's feeling quite well. She's exercising regularly.  July 05, 2012:  No CP.  She has mild  Dyspnea.  She is still getting phlebotomy regularly.  She has some mild dyspnea when she first lies down but it typically resolves and she doe not have to sleep on any pillows.  She is able to do her normal activities without problems.   07/09/2013:  [name] is doing ok.  Not walking as much as she used to.  Stays active. h Has lots of cramps in her feet.   Dec. 7 ,2015:  [name] is an 83 yo who we follow for HTN and MVP.  She also has polycythemia and gets phlebotomy once a month.  Staying moderately active.   Gets fatigued easy She had labs a month ago  Dec. 6, 2016:   [name] is doing ok from a cardiac standpoint . Broke her back this past march .  No CP ,  Occasional dyspnea.  Has polycythemia vera.  Had a phlebotomy last week .   July 07, 2015:  Has had a hip fracture in April.  Has had it repaired.  Still walking with a walker  April 28, 2016:  Doing ok Has focal swelling of the right foot .  Has stopped her metoprolol  BP has been high. Has been eating lots of soup   August 19, 2016: [name] is seen back today for follow up visit  She was in the hospital in June with hypertensive urgency and mental status changes. She was found to be  hyponatremic so her HCTZ was stopped and she was started on Lasix.    She went to [facility] and the diuretic was stopped .  Came home from [facility] a week ago .       Current Outpatient Prescriptions on File Prior to Visit  Medication Sig Dispense Refill  . amLODipine (NORVASC) 5 MG tablet Take 1 tablet (5 mg total) by mouth daily. 30 tablet 0  . anagrelide (AGRYLIN) 1 MG capsule Take 2 capsules (2 mg total) by mouth 2 (two) times daily. 120 capsule 4  . aspirin 81 MG tablet Take 81 mg by mouth daily.      . Calcium Carbonate-Vitamin D (CALCIUM 500/D PO) Take 1 capsule by mouth daily.    . Cetirizine HCl (ZYRTEC PO) Take 1 tablet by mouth as needed (AS NEEDED FOR ALLERGIES).     . fluticasone (FLONASE) 50 MCG/ACT nasal spray Place 2 sprays into the nose as needed for allergies or rhinitis. Reported on 06/11/2015    . levETIRAcetam (KEPPRA) 500 MG tablet Take 1 tablet (500 mg total) by mouth 2 (two) times daily. 60 tablet 0  . lisinopril (PRINIVIL,ZESTRIL) 20 MG tablet Take 20 mg by mouth daily.    Marland Kitchen loperamide (IMODIUM) 2 MG  capsule Take 2 mg by mouth as needed for diarrhea or loose stools.    . metoprolol succinate (TOPROL-XL) 50 MG 24 hr tablet TAKE 1 TABLET ONCE DAILY WITH FOOD. 90 tablet 0  . pantoprazole (PROTONIX) 40 MG tablet Take 40 mg by mouth as needed (AS NEEDED FOR STOMACH).     Marland Kitchen potassium chloride (K-DUR) 10 MEQ tablet TAKE 1 TABLET ONCE DAILY. 90 tablet 2  . Probiotic Product (ULTRAFLORA IMMUNE HEALTH PO) Take 1 tablet by mouth daily. Reported on 07/01/2015     No current facility-administered medications on file prior to visit.     Allergies  Allergen Reactions  . Sulfur Other (See Comments)    Pt doesn't remember reaction.    Past Medical History:  Diagnosis Date  . Acute blood loss anemia   . Arthritis   . Atrophic gastritis   . Breast cancer ([place])    right  . Candida esophagitis ([place])   . Chest tightness   . Chronic gastritis   . Closed  intertrochanteric fracture of left femur with routine healing   . Diverticulosis   . Hypertension   . IBS (irritable bowel syndrome)   . MVP (mitral valve prolapse)   . Palpitation   . Polycythemia   . PVC's (premature ventricular contractions)   . Rhinitis   . Unsteady gait     Past Surgical History:  Procedure Laterality Date  . ABDOMINAL HYSTERECTOMY     ovaries taken  . APPENDECTOMY    . BREAST LUMPECTOMY     right  . CARDIOVASCULAR STRESS TEST  12/23/2000   EF 70%  . CATARACT EXTRACTION     bilateral  . ELBOW FRACTURE SURGERY     left  . FEMUR IM NAIL Left 05/08/2015   Procedure: INTERTROCHANTERIC FEMORAL NAIL;  Surgeon: Rod Can, MD;  Location: [facility];  Service: Orthopedics;  Laterality: Left;  . HEMATOMA EVACUATION     right  . TONSILLECTOMY    . TRANSTHORACIC ECHOCARDIOGRAM  03/05/2010   EF 55-60%    History  Smoking Status  . Former Smoker  . Quit date: 08/24/1980  Smokeless Tobacco  . Never Used    History  Alcohol Use  . Yes    Comment: occasional    Family History  Problem Relation Age of Onset  . Stomach cancer Mother   . Hypertension Father   . Stroke Father   . Prostate cancer Father   . Hypertension Sister   . Hypertension Brother   . Prostate cancer Brother   . Diabetes Brother   . Colon cancer Neg Hx     Reviw of Systems:  Reviewed in the HPI.  All other systems are negative.  Physical Exam: BP (!) 158/70 (BP Location: Right Arm, Patient Position: Sitting, Cuff Size: Normal)   Pulse 84   Ht 5\' 2"  (1.575 m)   Wt 93 lb 6.4 oz (42.4 kg)   SpO2 98%   BMI 17.08 kg/m  The patient is alert and oriented x 3.  The mood and affect are normal.   Skin: warm and dry.  Color is normal.   HEENT:   Neck is supple. His membranes are moist. The carotids are normal. No JVD. Lungs: Lungs are clear.  Heart: Regular rate S1-S2.  She is a soft systolic murmur. Abdomen: She is thin. She has good bowel sounds. There are no bruits  Extremities:   No clubbing cyanosis or edema Neuro:  Nonfocal.    ECG: April 28, 2016:   NSR at 91.     Assessment / Plan:   1. Mitral valve prolapse- stable   3. Polycythemia 4. Hypertension -  Was recently admitted to the hospital with hypertensive urgency.  Had been eating lots of processed foods. BP is fairly well controlled at this time. Advised her to limit her [employer]   5. Left hip fracture :   Walking with a cane     Mertie Moores, MD  08/19/2016 11:07 AM    [address],  [place], [place]  [address] Pager [phone] Phone: [phone]; Fax: [phone]

## 2016-08-20 DIAGNOSIS — D45 Polycythemia vera: Secondary | ICD-10-CM | POA: Diagnosis not present

## 2016-08-20 DIAGNOSIS — M810 Age-related osteoporosis without current pathological fracture: Secondary | ICD-10-CM | POA: Diagnosis not present

## 2016-08-20 DIAGNOSIS — Z7902 Long term (current) use of antithrombotics/antiplatelets: Secondary | ICD-10-CM | POA: Diagnosis not present

## 2016-08-20 DIAGNOSIS — I10 Essential (primary) hypertension: Secondary | ICD-10-CM | POA: Diagnosis not present

## 2016-08-20 DIAGNOSIS — Z7982 Long term (current) use of aspirin: Secondary | ICD-10-CM | POA: Diagnosis not present

## 2016-08-20 DIAGNOSIS — M1990 Unspecified osteoarthritis, unspecified site: Secondary | ICD-10-CM | POA: Diagnosis not present

## 2016-08-23 DIAGNOSIS — E7800 Pure hypercholesterolemia, unspecified: Secondary | ICD-10-CM | POA: Diagnosis not present

## 2016-08-23 DIAGNOSIS — Z0000 Encounter for general adult medical examination without abnormal findings: Secondary | ICD-10-CM | POA: Diagnosis not present

## 2016-08-23 DIAGNOSIS — I10 Essential (primary) hypertension: Secondary | ICD-10-CM | POA: Diagnosis not present

## 2016-08-23 DIAGNOSIS — E559 Vitamin D deficiency, unspecified: Secondary | ICD-10-CM | POA: Diagnosis not present

## 2016-08-24 DIAGNOSIS — I10 Essential (primary) hypertension: Secondary | ICD-10-CM | POA: Diagnosis not present

## 2016-08-24 DIAGNOSIS — D45 Polycythemia vera: Secondary | ICD-10-CM | POA: Diagnosis not present

## 2016-08-24 DIAGNOSIS — M1990 Unspecified osteoarthritis, unspecified site: Secondary | ICD-10-CM | POA: Diagnosis not present

## 2016-08-24 DIAGNOSIS — Z7982 Long term (current) use of aspirin: Secondary | ICD-10-CM | POA: Diagnosis not present

## 2016-08-24 DIAGNOSIS — Z7902 Long term (current) use of antithrombotics/antiplatelets: Secondary | ICD-10-CM | POA: Diagnosis not present

## 2016-08-24 DIAGNOSIS — M810 Age-related osteoporosis without current pathological fracture: Secondary | ICD-10-CM | POA: Diagnosis not present

## 2016-08-25 ENCOUNTER — Other Ambulatory Visit: Payer: Medicare Other

## 2016-08-25 ENCOUNTER — Telehealth: Payer: Self-pay | Admitting: *Deleted

## 2016-08-25 NOTE — Telephone Encounter (Signed)
Attempted to call daughter Meshawn Oconnor back x 2 with no answers.  Left message on voice mail x 2 asking [name] to call nurse back. Teresa's   Phone     [phone]

## 2016-08-26 ENCOUNTER — Telehealth: Payer: Self-pay | Admitting: Hematology

## 2016-08-26 DIAGNOSIS — M810 Age-related osteoporosis without current pathological fracture: Secondary | ICD-10-CM | POA: Diagnosis not present

## 2016-08-26 DIAGNOSIS — M1990 Unspecified osteoarthritis, unspecified site: Secondary | ICD-10-CM | POA: Diagnosis not present

## 2016-08-26 DIAGNOSIS — I10 Essential (primary) hypertension: Secondary | ICD-10-CM | POA: Diagnosis not present

## 2016-08-26 DIAGNOSIS — Z7982 Long term (current) use of aspirin: Secondary | ICD-10-CM | POA: Diagnosis not present

## 2016-08-26 DIAGNOSIS — Z7902 Long term (current) use of antithrombotics/antiplatelets: Secondary | ICD-10-CM | POA: Diagnosis not present

## 2016-08-26 DIAGNOSIS — D45 Polycythemia vera: Secondary | ICD-10-CM | POA: Diagnosis not present

## 2016-08-26 NOTE — Telephone Encounter (Signed)
I received a copy of her outside lab from 08/23/2016, which was ordered by Dr. Shelia Media. It showed WBC 12.4, hemoglobin 12.8, hematocrit 41.6%, platelet 409K. creatinine 1.2, potassium 6.0, normal liver function. She does not need phlebotomy.   I called patient, no answer, I called her daughter and spoke with her about her left results. She is aware.Patient has stopped potassium supplement. Patient is scheduled to see Dr. Shelia Media tomorrow for routine follow-up. Patient was hospitalized a month ago for hypertension urgency and had seizure. She was discharged to rehabilitation, Dr. home now. She is doing better.  She is scheduled to see [place] back with lab and phlebotomy appointment on 09/22/2016.  [name]  08/26/2016

## 2016-08-27 DIAGNOSIS — M810 Age-related osteoporosis without current pathological fracture: Secondary | ICD-10-CM | POA: Diagnosis not present

## 2016-08-27 DIAGNOSIS — I10 Essential (primary) hypertension: Secondary | ICD-10-CM | POA: Diagnosis not present

## 2016-08-27 DIAGNOSIS — E875 Hyperkalemia: Secondary | ICD-10-CM | POA: Diagnosis not present

## 2016-08-27 DIAGNOSIS — D473 Essential (hemorrhagic) thrombocythemia: Secondary | ICD-10-CM | POA: Diagnosis not present

## 2016-08-27 DIAGNOSIS — K219 Gastro-esophageal reflux disease without esophagitis: Secondary | ICD-10-CM | POA: Diagnosis not present

## 2016-08-30 DIAGNOSIS — D45 Polycythemia vera: Secondary | ICD-10-CM | POA: Diagnosis not present

## 2016-08-30 DIAGNOSIS — M810 Age-related osteoporosis without current pathological fracture: Secondary | ICD-10-CM | POA: Diagnosis not present

## 2016-08-30 DIAGNOSIS — M1990 Unspecified osteoarthritis, unspecified site: Secondary | ICD-10-CM | POA: Diagnosis not present

## 2016-08-30 DIAGNOSIS — Z7982 Long term (current) use of aspirin: Secondary | ICD-10-CM | POA: Diagnosis not present

## 2016-08-30 DIAGNOSIS — Z7902 Long term (current) use of antithrombotics/antiplatelets: Secondary | ICD-10-CM | POA: Diagnosis not present

## 2016-08-30 DIAGNOSIS — I10 Essential (primary) hypertension: Secondary | ICD-10-CM | POA: Diagnosis not present

## 2016-09-02 DIAGNOSIS — M1990 Unspecified osteoarthritis, unspecified site: Secondary | ICD-10-CM | POA: Diagnosis not present

## 2016-09-02 DIAGNOSIS — Z7982 Long term (current) use of aspirin: Secondary | ICD-10-CM | POA: Diagnosis not present

## 2016-09-02 DIAGNOSIS — D45 Polycythemia vera: Secondary | ICD-10-CM | POA: Diagnosis not present

## 2016-09-02 DIAGNOSIS — Z7902 Long term (current) use of antithrombotics/antiplatelets: Secondary | ICD-10-CM | POA: Diagnosis not present

## 2016-09-02 DIAGNOSIS — I10 Essential (primary) hypertension: Secondary | ICD-10-CM | POA: Diagnosis not present

## 2016-09-02 DIAGNOSIS — M810 Age-related osteoporosis without current pathological fracture: Secondary | ICD-10-CM | POA: Diagnosis not present

## 2016-09-03 ENCOUNTER — Ambulatory Visit (INDEPENDENT_AMBULATORY_CARE_PROVIDER_SITE_OTHER): Payer: Medicare Other | Admitting: Diagnostic Neuroimaging

## 2016-09-03 ENCOUNTER — Encounter: Payer: Self-pay | Admitting: Diagnostic Neuroimaging

## 2016-09-03 VITALS — BP 153/73 | HR 73 | Ht 62.0 in | Wt 91.6 lb

## 2016-09-03 DIAGNOSIS — R569 Unspecified convulsions: Secondary | ICD-10-CM

## 2016-09-03 DIAGNOSIS — R413 Other amnesia: Secondary | ICD-10-CM

## 2016-09-03 MED ORDER — LEVETIRACETAM 500 MG PO TABS: 500.0000 mg | ORAL_TABLET | Freq: Two times a day (BID) | ORAL | 4 refills | Status: DC

## 2016-09-03 NOTE — Progress Notes (Signed)
[facility]  PATIENT: [name] DOB: 1926-05-08  REFERRING CLINICIAN: Carolin Sicks, [place] HISTORY FROM: patient and daughter and chart review REASON FOR VISIT: new consult    HISTORICAL  CHIEF COMPLAINT:  Chief Complaint  Patient presents with  . New Patient (Initial Visit)    Referral from Rosita Fire, MD for seizures     HISTORY OF PRESENT ILLNESS:   83 year old female with history of breast cancer, however to close, hypertension, polycythemia, here for evaluation Hospital follow-up after possible seizure and confusion versus hypertensive encephalopathy.  Patient presented to hospital on 07/25/16 for confusion and slurred speech. Patient presented to the emergency room with blood pressure of 212/99. Patient was admitted for further workup. CT scan and MRI of the brain showed no acute findings. Lumbar puncture ruled out infection. Patient had a witnessed event that was suspicious for seizure and therefore patient was treated empirically with antiseizure medications. Patient was found to have incidental meningioma. Blood pressure was improved during hospital course. Now patient continues on antiseizure medication and doing well.  Of note prior to hospitalization patient had some mild short-term memory loss over past 6-12 months. However she was independent living alone driving, shopping, taking care of most of her needs. Her daughter lives 1 block away and was supporting her a little bit before hospitalization, but now is more significantly involved. Patient having poor appetite, weight loss, short-term memory problems now.    REVIEW OF SYSTEMS: Full 14 system review of systems performed and negative with exception of: Memory loss confusion headache dizziness decreased energy diarrhea swelling in legs weight loss fatigue blurred vision anemia.  ALLERGIES: Allergies  Allergen Reactions  . Sulfur Other (See Comments)    Pt doesn't remember reaction.     HOME MEDICATIONS: Outpatient Medications Prior to Visit  Medication Sig Dispense Refill  . anagrelide (AGRYLIN) 1 MG capsule Take 2 capsules (2 mg total) by mouth 2 (two) times daily. 120 capsule 4  . aspirin 81 MG tablet Take 81 mg by mouth daily.      . Calcium Carbonate-Vitamin D (CALCIUM 500/D PO) Take 1 capsule by mouth daily.    . Cetirizine HCl (ZYRTEC PO) Take 1 tablet by mouth as needed (AS NEEDED FOR ALLERGIES).     . fluticasone (FLONASE) 50 MCG/ACT nasal spray Place 2 sprays into the nose as needed for allergies or rhinitis. Reported on 06/11/2015    . levETIRAcetam (KEPPRA) 500 MG tablet Take 1 tablet (500 mg total) by mouth 2 (two) times daily. 60 tablet 0  . loperamide (IMODIUM) 2 MG capsule Take 2 mg by mouth as needed for diarrhea or loose stools.    . metoprolol succinate (TOPROL-XL) 50 MG 24 hr tablet TAKE 1 TABLET ONCE DAILY WITH FOOD. 90 tablet 0  . olmesartan (BENICAR) 20 MG tablet Take 1 tablet (20 mg total) by mouth daily. 90 tablet 3  . pantoprazole (PROTONIX) 40 MG tablet Take 40 mg by mouth as needed (AS NEEDED FOR STOMACH).     . Probiotic Product (ULTRAFLORA IMMUNE HEALTH PO) Take 1 tablet by mouth daily. Reported on 07/01/2015    . potassium chloride (K-DUR) 10 MEQ tablet TAKE 1 TABLET ONCE DAILY. (Patient not taking: Reported on 09/03/2016) 90 tablet 2   No facility-administered medications prior to visit.     PAST MEDICAL HISTORY: Past Medical History:  Diagnosis Date  . Acute blood loss anemia   . Acute encephalopathy   . Altered mental state   . Arthritis   .  Atrophic gastritis   . Breast cancer ([place])    right  . Candida esophagitis ([place])   . Chest tightness   . Chronic gastritis   . Closed hip fracture ([place])   . Closed intertrochanteric fracture of left femur with routine healing   . Diverticulosis   . Hypertension   . IBS (irritable bowel syndrome)   . Mitral valve prolapse   . MVP (mitral valve prolapse)   . Osteoporosis   . Palpitation    . Polycythemia   . PVC's (premature ventricular contractions)   . Rhinitis   . Seizures ([place])   . Unsteady gait     PAST SURGICAL HISTORY: Past Surgical History:  Procedure Laterality Date  . ABDOMINAL HYSTERECTOMY     ovaries taken  . APPENDECTOMY    . BREAST LUMPECTOMY     right  . CARDIOVASCULAR STRESS TEST  12/23/2000   EF 70%  . CATARACT EXTRACTION     bilateral  . ELBOW FRACTURE SURGERY     left  . FEMUR IM NAIL Left 05/08/2015   Procedure: INTERTROCHANTERIC FEMORAL NAIL;  Surgeon: Rod Can, MD;  Location: [facility];  Service: Orthopedics;  Laterality: Left;  . HEMATOMA EVACUATION     right  . TONSILLECTOMY    . TRANSTHORACIC ECHOCARDIOGRAM  03/05/2010   EF 55-60%    FAMILY HISTORY: Family History  Problem Relation Age of Onset  . Stomach cancer Mother   . Hypertension Father   . Stroke Father   . Prostate cancer Father   . Hypertension Sister   . Hypertension Brother   . Prostate cancer Brother   . Diabetes Brother   . Colon cancer Neg Hx     SOCIAL HISTORY:  Social History   Social History  . Marital status: Widowed    Spouse name: N/A  . Number of children: 2  . Years of education: N/A   Occupational History  . retired Retired   Social History Main Topics  . Smoking status: Former Smoker    Quit date: 08/24/1980  . Smokeless tobacco: Never Used  . Alcohol use Yes     Comment: occasional  . Drug use: No  . Sexual activity: Not on file   Other Topics Concern  . Not on file   Social History Narrative  . No narrative on file      PHYSICAL EXAM  GENERAL EXAM/CONSTITUTIONAL: Vitals:  Vitals:   09/03/16 1031  BP: (!) 153/73  Pulse: 73  Weight: 91 lb 9.6 oz (41.5 kg)  Height: 5\' 2"  (1.575 m)     Body mass index is 16.75 kg/m. No exam data present  Patient is in no distress; well developed, nourished and groomed; neck is supple  CARDIOVASCULAR:  Examination of carotid arteries is normal; no carotid bruits  Regular rate  and rhythm, no murmurs  Examination of peripheral vascular system by observation and palpation is normal  EYES:  Ophthalmoscopic exam of optic discs and posterior segments is normal; no papilledema or hemorrhages  MUSCULOSKELETAL:  Gait, strength, tone, movements noted in Neurologic exam below  NEUROLOGIC: MENTAL STATUS:  MMSE - [place] Exam 09/03/2016  Orientation to time 5  Orientation to Place 5  Registration 3  Attention/ Calculation 2  Recall 1  Language- name 2 objects 2  Language- repeat 1  Language- follow 3 step command 3  Language- read & follow direction 1  Write a sentence 1  Copy design 1  Total score 25  awake, alert, oriented to person, place and time  recent and remote memory intact  normal attention and concentration  language fluent, comprehension intact, naming intact,   fund of knowledge appropriate  CRANIAL NERVE:   2nd - no papilledema on fundoscopic exam  2nd, 3rd, 4th, 6th - pupils equal and reactive to light, visual fields full to confrontation, extraocular muscles intact, no nystagmus  5th - facial sensation symmetric  7th - facial strength symmetric  8th - hearing intact  9th - palate elevates symmetrically, uvula midline  11th - shoulder shrug symmetric  12th - tongue protrusion midline  MOTOR:   normal bulk and tone, full strength in the BUE, BLE  SENSORY:   normal and symmetric to light touch, temperature, vibration  COORDINATION:   finger-nose-finger, fine finger movements normal  REFLEXES:   deep tendon reflexes present and symmetric  GAIT/STATION:   narrow based gait; SLOW CAUTIOUS GAIT; USES SINGLE POINT CANE    DIAGNOSTIC DATA (LABS, IMAGING, TESTING) - I reviewed patient records, labs, notes, testing and imaging myself where available.  Lab Results  Component Value Date   WBC 14.9 (H) 07/29/2016   HGB 12.2 07/29/2016   HCT 38.2 07/29/2016   MCV 62.0 (L) 07/29/2016   PLT 493 (H)  07/29/2016      Component Value Date/Time   NA 135 07/29/2016 0513   NA 139 06/30/2016 1032   K 3.6 07/29/2016 0513   K 4.8 06/30/2016 1032   CL 103 07/29/2016 0513   CL 99 05/25/2012 0942   CO2 22 07/29/2016 0513   CO2 24 06/30/2016 1032   GLUCOSE 101 (H) 07/29/2016 0513   GLUCOSE 125 06/30/2016 1032   GLUCOSE 103 (H) 05/25/2012 0942   BUN 18 07/29/2016 0513   BUN 31.9 (H) 06/30/2016 1032   CREATININE 0.87 07/29/2016 0513   CREATININE 1.3 (H) 06/30/2016 1032   CALCIUM 7.7 (L) 07/29/2016 0513   CALCIUM 9.6 06/30/2016 1032   PROT 6.7 07/24/2016 2027   PROT 6.5 06/30/2016 1032   ALBUMIN 3.6 07/26/2016 0219   ALBUMIN 3.9 06/30/2016 1032   AST 17 07/24/2016 2027   AST 16 06/30/2016 1032   ALT 10 (L) 07/24/2016 2027   ALT 12 06/30/2016 1032   ALKPHOS 71 07/24/2016 2027   ALKPHOS 68 06/30/2016 1032   BILITOT 0.5 07/24/2016 2027   BILITOT 0.54 06/30/2016 1032   GFRNONAA 58 (L) 07/29/2016 0513   GFRAA >60 07/29/2016 0513   Lab Results  Component Value Date   CHOL 131 07/25/2016   HDL 46 07/25/2016   LDLCALC 65 07/25/2016   TRIG 101 07/25/2016   CHOLHDL 2.8 07/25/2016   No results found for: HGBA1C Lab Results  Component Value Date   [id number] 798 07/25/2016   Lab Results  Component Value Date   TSH 5.593 (H) 07/25/2016    07/25/16 MRI brain [I reviewed images myself and agree with interpretation. -[facility]]  - Advanced atrophy. Chronic microvascular ischemic change. No acute stroke. - 10 x 14 x 9 mm RIGHT subfrontal extra-axial mass, without edema, likely incidental meningioma in this elderly female. - Diffuse low T1 signal intensity bone marrow, likely related to polycythemia and thrombocytosis.  07/26/16 EEG  1. Moderate diffuse slowing of the waking background 2. Occasional focal slowing over the left hemisphere     ASSESSMENT AND PLAN  83 y.o. year old female here with new onset episode of confusion in the setting of hypertension, also with witnessed seizure  in the emergency room. Could  represent hypertensive encephalopathy triggering a seizure versus seizure due to other cause triggering hypertension. Of note patient has significant brain atrophy as well as history of mild short-term memory loss. This may raise possibility of underlying mild neurodegenerative dementia versus mild cognitive impairment.  Dx:  1. Seizure ([place])   2. Memory loss      PLAN:  I spent 60 minutes of face to face time with patient. Greater than 50% of time was spent in counseling and coordination of care with patient. In summary we discussed:  SEIZURE - continue levetiracetam - no driving until seizure free x 6 months; also has memory loss and mobility issues which may not make it safe to return to driving  MEMORY LOSS / BRAIN ATROPHY (? mild cognitive impairment vs mild dementia) - monitor symptoms - increase safety and supervision  HYPERTENSION - continue BP medications  Meds ordered this encounter  Medications  . levETIRAcetam (KEPPRA) 500 MG tablet    Sig: Take 1 tablet (500 mg total) by mouth 2 (two) times daily.    Dispense:  180 tablet    Refill:  4   Return in about 6 months (around 03/06/2017).    Penni Bombard, MD 04/07/1222, 49:75 AM Certified in Neurology, Neurophysiology and Neuroimaging  [facility] [address] [place], [place] [address] [phone]

## 2016-09-03 NOTE — Patient Instructions (Signed)
Thank you for coming to see [place] at [facility]. I hope we have been able to provide you high quality care today.  You may receive a patient satisfaction survey over the next few weeks. We would appreciate your feedback and comments so that we may continue to improve ourselves and the health of our patients.  SEIZURE - continue levetiracetam  - no driving until seizure free x 6 months; also has memory loss and mobility issues may not make it safe to return to driving in the future  MEMORY LOSS / BRAIN ATROPHY (? mild cognitive impairment vs mild dementia) - monitor symptoms - increase safety and supervision  HYPERTENSION - continue BP medications   ~~~~~~~~~~~~~~~~~~~~~~~~~~~~~~~~~~~~~~~~~~~~~~~~~~~~~~~~~~~~~~~~~  DR. Makhi Muzquiz'S GUIDE TO HAPPY AND HEALTHY LIVING These are some of my general health and wellness recommendations. Some of them may apply to you better than others. Please use common sense as you try these suggestions and feel free to ask me any questions.   ACTIVITY/FITNESS Mental, social, emotional and physical stimulation are very important for brain and body health. Try learning a new activity (arts, music, language, sports, games).  Keep moving your body to the best of your abilities. You can do this at home, inside or outside, the park, community center, gym or anywhere you like. Consider a physical therapist or personal trainer to get started. Consider the app Sworkit. Fitness trackers such as smart-watches, smart-phones or Fitbits can help as well.   NUTRITION Eat more plants: colorful vegetables, nuts, seeds and berries.  Eat less sugar, salt, preservatives and processed foods.  Avoid toxins such as cigarettes and alcohol.  Drink water when you are thirsty. Warm water with a slice of lemon is an excellent morning drink to start the day.  Consider these websites for more information The [employer]  (https://www.henry-hernandez.biz/) [employer] Nutrition (WindowBlog.ch)   RELAXATION Consider practicing mindfulness meditation or other relaxation techniques such as deep breathing, prayer, yoga, tai chi, massage. See website mindful.org or the apps Headspace or Calm to help get started.   SLEEP Try to get at least 7-8+ hours sleep per day. Regular exercise and reduced caffeine will help you sleep better. Practice good sleep hygeine techniques. See website sleep.org for more information.   PLANNING Prepare estate planning, living will, healthcare POA documents. Sometimes this is best planned with the help of an attorney. Theconversationproject.org and agingwithdignity.org are excellent resources.

## 2016-09-07 DIAGNOSIS — Z7902 Long term (current) use of antithrombotics/antiplatelets: Secondary | ICD-10-CM | POA: Diagnosis not present

## 2016-09-07 DIAGNOSIS — Z7982 Long term (current) use of aspirin: Secondary | ICD-10-CM | POA: Diagnosis not present

## 2016-09-07 DIAGNOSIS — M1990 Unspecified osteoarthritis, unspecified site: Secondary | ICD-10-CM | POA: Diagnosis not present

## 2016-09-07 DIAGNOSIS — M810 Age-related osteoporosis without current pathological fracture: Secondary | ICD-10-CM | POA: Diagnosis not present

## 2016-09-07 DIAGNOSIS — I10 Essential (primary) hypertension: Secondary | ICD-10-CM | POA: Diagnosis not present

## 2016-09-07 DIAGNOSIS — D45 Polycythemia vera: Secondary | ICD-10-CM | POA: Diagnosis not present

## 2016-09-09 ENCOUNTER — Other Ambulatory Visit: Payer: Medicare Other | Admitting: *Deleted

## 2016-09-09 DIAGNOSIS — I10 Essential (primary) hypertension: Secondary | ICD-10-CM | POA: Diagnosis not present

## 2016-09-09 DIAGNOSIS — I341 Nonrheumatic mitral (valve) prolapse: Secondary | ICD-10-CM | POA: Diagnosis not present

## 2016-09-09 DIAGNOSIS — M810 Age-related osteoporosis without current pathological fracture: Secondary | ICD-10-CM | POA: Diagnosis not present

## 2016-09-09 LAB — BASIC METABOLIC PANEL
BUN/Creatinine Ratio: 20 (ref 12–28)
BUN: 21 mg/dL (ref 8–27)
CALCIUM: 9.5 mg/dL (ref 8.7–10.3)
CHLORIDE: 100 mmol/L (ref 96–106)
CO2: 21 mmol/L (ref 20–29)
Creatinine, Ser: 1.04 mg/dL — ABNORMAL HIGH (ref 0.57–1.00)
GFR, EST AFRICAN AMERICAN: 55 mL/min/{1.73_m2} — AB (ref >59)
GFR, EST NON AFRICAN AMERICAN: 48 mL/min/{1.73_m2} — AB (ref >59)
Glucose: 101 mg/dL — ABNORMAL HIGH (ref 65–99)
POTASSIUM: 5.4 mmol/L — AB (ref 3.5–5.2)
SODIUM: 135 mmol/L (ref 134–144)

## 2016-09-10 DIAGNOSIS — M1990 Unspecified osteoarthritis, unspecified site: Secondary | ICD-10-CM | POA: Diagnosis not present

## 2016-09-10 DIAGNOSIS — Z7982 Long term (current) use of aspirin: Secondary | ICD-10-CM | POA: Diagnosis not present

## 2016-09-10 DIAGNOSIS — Z7902 Long term (current) use of antithrombotics/antiplatelets: Secondary | ICD-10-CM | POA: Diagnosis not present

## 2016-09-10 DIAGNOSIS — I10 Essential (primary) hypertension: Secondary | ICD-10-CM | POA: Diagnosis not present

## 2016-09-10 DIAGNOSIS — D45 Polycythemia vera: Secondary | ICD-10-CM | POA: Diagnosis not present

## 2016-09-10 DIAGNOSIS — M810 Age-related osteoporosis without current pathological fracture: Secondary | ICD-10-CM | POA: Diagnosis not present

## 2016-09-13 ENCOUNTER — Other Ambulatory Visit: Payer: Self-pay | Admitting: *Deleted

## 2016-09-13 DIAGNOSIS — E875 Hyperkalemia: Secondary | ICD-10-CM

## 2016-09-15 DIAGNOSIS — Z7902 Long term (current) use of antithrombotics/antiplatelets: Secondary | ICD-10-CM | POA: Diagnosis not present

## 2016-09-15 DIAGNOSIS — M810 Age-related osteoporosis without current pathological fracture: Secondary | ICD-10-CM | POA: Diagnosis not present

## 2016-09-15 DIAGNOSIS — M1990 Unspecified osteoarthritis, unspecified site: Secondary | ICD-10-CM | POA: Diagnosis not present

## 2016-09-15 DIAGNOSIS — I10 Essential (primary) hypertension: Secondary | ICD-10-CM | POA: Diagnosis not present

## 2016-09-15 DIAGNOSIS — Z7982 Long term (current) use of aspirin: Secondary | ICD-10-CM | POA: Diagnosis not present

## 2016-09-15 DIAGNOSIS — D45 Polycythemia vera: Secondary | ICD-10-CM | POA: Diagnosis not present

## 2016-09-16 NOTE — Progress Notes (Signed)
[facility] OFFICE PROGRESS NOTE DATE OF VISIT: 09/22/2016   [name], [name] [id number]  DIAGNOSIS: Polycythemia vera ([place]) - Plan: CBC with Differential/Platelet, Breast Cancer   PREVIOUS AND CURRENT THERAPY:  1. Phlebotomy if hematocrit is greater than or equal to 42% (changed to 45% in May 2016). The patient receives 300-400 mL of normal saline following each 2/1-1-unit phlebotomy  2. Anagrelide 0.5 mg twice daily and aspirin 81 mg daily, anagrelide increased to 1.0mg  AM and 0.5mg  PM since Feb 2016, and 1mg  twice daily on 12/03/2015, increased to 1.5mg  bid in 02/2016. Increased to 2mg  BID in 03/2016. 3. She previously tried Hydrea 500mg  daily, developed thrombocytopenia, was subsequently held.  INTERVAL HISTORY:  [name] 83 y.o. female with a history of polycythemia vera (04/21/2007) with positive JAK2 mutation is here for follow-up with her daughter. The patient is feeling well today. Since her last visit with [place] she was hospitalized for seizures. She is now followed by neurology and is on Keppra. The patient remains on anagrelide and is tolerating it well. Daughter has some questions regarding this medication and is also indicated that the medication costs over $300 per month for her mother. The patient denies any fevers or chills. She denies chest pain, shortness of breath, cough, hemoptysis. Denies abdominal pain, nausea, vomiting. Blood pressure is noted to be elevated today at our office, but the patient states she has not taken her blood pressure medications. The daughter states that they check the blood pressure at home and it typically runs 150s to 70s over 80's. The patient is here for evaluation of her blood counts and for possible phlebotomy.  MEDICAL HISTORY: Past Medical History:  Diagnosis Date  . Acute blood loss anemia   . Acute encephalopathy   . Altered mental state   . Arthritis   . Atrophic  gastritis   . Breast cancer ([place])    right  . Candida esophagitis ([place])   . Chest tightness   . Chronic gastritis   . Closed hip fracture ([place])   . Closed intertrochanteric fracture of left femur with routine healing   . Diverticulosis   . Hypertension   . IBS (irritable bowel syndrome)   . Mitral valve prolapse   . MVP (mitral valve prolapse)   . Osteoporosis   . Palpitation   . Polycythemia   . PVC's (premature ventricular contractions)   . Rhinitis   . Seizures ([place])   . Unsteady gait     INTERIM HISTORY: has Malignant neoplasm of female breast ([place]); Polycythemia vera ([place]); Atrophic gastritis; DIVERTICULOSIS OF COLON; ARTHRITIS; ABDOMINAL PAIN, EPIGASTRIC; HTN (hypertension); Mitral valve prolapse; Closed left hip fracture ([place]); Leukocytosis; Fracture, intertrochanteric, left femur ([place]); Faintness; Acute blood loss anemia; Thrombocytosis ([place]); Dehydration; Osteoporosis; History of breast cancer in female; Foot swelling; Hypertensive urgency; Acute encephalopathy; Altered mental status; Protein-calorie malnutrition, severe; Seizure ([place]); Fever; and Memory loss on her problem list.    ALLERGIES:  is allergic to sulfur.  MEDICATIONS: has a current medication list which includes the following prescription(s): amlodipine, anagrelide, aspirin, calcium carbonate-vitamin d, cetirizine hcl, vitamin d3, colestipol, fluticasone, hydrochlorothiazide, lisinopril, loperamide, metoprolol succinate, multivitamin, pantoprazole, potassium chloride, and probiotic product.  SURGICAL HISTORY:  Past Surgical History:  Procedure Laterality Date  . ABDOMINAL HYSTERECTOMY     ovaries taken  . APPENDECTOMY    . BREAST LUMPECTOMY     right  . CARDIOVASCULAR STRESS TEST  12/23/2000   EF  70%  . CATARACT EXTRACTION     bilateral  . ELBOW FRACTURE SURGERY     left  . FEMUR IM NAIL Left 05/08/2015   Procedure: INTERTROCHANTERIC FEMORAL NAIL;  Surgeon: Rod Can, MD;  Location: [facility];   Service: Orthopedics;  Laterality: Left;  . HEMATOMA EVACUATION     right  . TONSILLECTOMY    . TRANSTHORACIC ECHOCARDIOGRAM  03/05/2010   EF 55-60%   PROBLEM LIST:  1. Polycythemia vera diagnosed in March 2009. JAK2 mutation was detected on 04/21/2007. Hemoglobin in April 2009 was 18.4 with hematocrit of 55.0. Platelet count on July 12, 2007, was 868,000. The patient has never had a bone marrow exam. She had been undergoing phlebotomies in the past, approximately once a month, for hematocrit greater than or equal to 45%. The patient has been on anagrelide 0.5 mg twice daily, as well as aspirin. She has not had any thrombotic complications. We will be carrying out a 1-unit phlebotomy whenever the hematocrit is greater than or equal to 42%. The patient does require normal saline 300 mL after phlebotomy because of symptomatic hypotension.  2. 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  Lumpectomy on 01/04/2006 followed by radiation treatments under the direction of Dr. Gery Pray. The patient has not required any systemic therapy. She does have yearly mammograms.  3. Mitral valve prolapse.  4. Hypertension.  5. Diverticulosis.  6. History of esophageal stricture.  7. History of gastritis.  8. History of pelvic fractures after falling on August 23, 2006.  REVIEW OF SYSTEMS:   Constitutional: Denies fevers, chills or abnormal weight loss Eyes: Denies blurriness of vision Ears, nose, mouth, throat, and face: Denies mucositis or sore throat Respiratory: Denies cough, dyspnea or wheezes Cardiovascular: Denies palpitation, chest discomfort or lower extremity swelling Gastrointestinal:  Denies nausea, heartburn or change in bowel habits Skin: Denies abnormal skin rashes Lymphatics: Denies new lymphadenopathy or easy bruising Neurological:Denies numbness, tingling or new weaknesses Behavioral/Psych: Mood is stable, no new changes  All other systems were reviewed with the patient and  are negative.  PHYSICAL EXAMINATION: ECOG PERFORMANCE STATUS: 0  Blood pressure (!) 200/84, pulse 76, temperature (!) 97.5 F (36.4 C), temperature source Oral, resp. rate 17, height 5\' 2"  (1.575 m), weight 91 lb 11.2 oz (41.6 kg), SpO2 98 %.  GENERAL:alert, no distress and comfortable; she has scoliosis and looks her stated age.  SKIN: skin color, texture, turgor are normal, no rashes but seborrheic keratosis.  EYES: normal, Conjunctiva are pink and non-injected, sclera clear OROPHARYNX:no exudate, no erythema and lips, buccal mucosa, and tongue normal  NECK: supple, thyroid normal size, non-tender, without nodularity LYMPH:  no palpable lymphadenopathy in the cervical, axillary or supraclavicular LUNGS: clear to auscultation and percussion with normal breathing effort HEART: regular rate & rhythm and no murmurs and no lower extremity edema ABDOMEN:abdomen soft, non-tender and normal bowel sounds Musculoskeletal: No issues. NEURO: alert & oriented x 3 with fluent speech, no focal motor/sensory deficits Breasts: deferred  LABORATORY DATA: CBC Latest Ref Rng & Units 09/22/2016 07/29/2016 07/28/2016  WBC 3.9 - 10.3 10e3/uL 12.2(H) 14.9(H) 18.9(H)  Hemoglobin 11.6 - 15.9 g/dL 13.3 12.2 12.2  Hematocrit 34.8 - 46.6 % 43.3 38.2 39.6  Platelets 145 - 400 10e3/uL 315 493(H) 367    CMP Latest Ref Rng & Units 09/09/2016 07/29/2016 07/28/2016  Glucose 65 - 99 mg/dL 101(H) 101(H) 144(H)  BUN 8 - 27 mg/dL 21 18 21(H)  Creatinine 0.57 - 1.00 mg/dL 1.04(H) 0.87  1.02(H)  Sodium 134 - 144 mmol/L 135 135 132(L)  Potassium 3.5 - 5.2 mmol/L 5.4(H) 3.6 4.0  Chloride 96 - 106 mmol/L 100 103 103  CO2 20 - 29 mmol/L 21 22 21(L)  Calcium 8.7 - 10.3 mg/dL 9.5 7.7(L) 7.5(L)  Total Protein 6.5 - 8.1 g/dL - - -  Total Bilirubin 0.3 - 1.2 mg/dL - - -  Alkaline Phos 38 - 126 U/L - - -  AST 15 - 41 U/L - - -  ALT 14 - 54 U/L - - -     IMAGING STUDIES:  1. Ultrasound of the abdomen, limited, on 01/21/2006,  showed a splenic volume of 73 cc with maximum ultrasound dimension of 8.8 cm. Spleen measured 8.8 x 4.1 x 3.9 cm, for a volume of 73 cc. No focal lesions were identified.  2. Diagnostic bilateral digital mammogram carried out at [facility] on 01/01/2011 showed no suspicious findings.  3. Mammogram, diagnostic, bilateral on 01/04/2012 showed no significant abnormalities. There were post lumpectomy changes present involving the right breast.   ASSESSMENT: Filbert Berthold Duvall 83 y.o. female with a history of Polycythemia vera ([place]) - Plan: CBC with Differential/Platelet  PLAN:  1. Myeloproliferative neoplasm, Polycythemia vera, JAK2(+)  --The patient is doing well clinically. -HCT 43.3 today and she will not need a phlebotomy today. -She previously did not tolerate Hydrea due to thrombocytopenia. -We previously discussed this is a incurable disease, most people have indolent and benign course, the major complication is thrombosis. Some patients may develop leukemia and myelofibrosis at the end. -Due to her thrombocytosis, I previously increased her anagrelide to 2 mg BID, which has controlled her thrombocytosis well overall -The patient's daughter has questions about anagrelide and plans concerned about the cost.has inquired about other treatment options including Hydrea and Jakafi.  -We will review with Dr. Burr Medico and return call to daughter with additional information.  2. History of right breast DCIS  --Involving the right breast with biopsy on 11/29/2005.  Lumpectomy on 01/04/2006 followed by radiation treatments under the direction of Dr. Gery Pray.  -She is still on annual screening mammogram surveillance, will continue.   3. L2 compression fracture in 04/2014, left femoral fracture in April 2017, osteoporosis -Her pain and the morbility has improved much after rehab  -Continue supportive care and symptom management by her primary care physician -She is on Prolia with her PCP, I  encourage her to continue calcium and vitamin D 2 tablets a day   Plan -Continue Anagrelide 2 mg BID.  -no phlebotomy today. -Lab and phlebotomy every 4 weeks X4. Phlebotomy 3/4 unit blood if HCT>45%, with normal saline 400 mL after phlebotomy. -f/u in 4 months or sooner if anagrelide is changed to another agent.  All questions were answered. The patient knows to call the clinic with any problems, questions or concerns. We can certainly see the patient much sooner if necessary.   Mikey Bussing  09/23/2016

## 2016-09-22 ENCOUNTER — Ambulatory Visit (HOSPITAL_BASED_OUTPATIENT_CLINIC_OR_DEPARTMENT_OTHER): Payer: Medicare Other | Admitting: Oncology

## 2016-09-22 ENCOUNTER — Other Ambulatory Visit (HOSPITAL_BASED_OUTPATIENT_CLINIC_OR_DEPARTMENT_OTHER): Payer: Medicare Other

## 2016-09-22 ENCOUNTER — Encounter: Payer: Self-pay | Admitting: Oncology

## 2016-09-22 ENCOUNTER — Telehealth: Payer: Self-pay | Admitting: Hematology

## 2016-09-22 VITALS — BP 200/84 | HR 76 | Temp 97.5°F | Resp 17 | Ht 62.0 in | Wt 91.7 lb

## 2016-09-22 DIAGNOSIS — D500 Iron deficiency anemia secondary to blood loss (chronic): Secondary | ICD-10-CM | POA: Diagnosis present

## 2016-09-22 DIAGNOSIS — D45 Polycythemia vera: Secondary | ICD-10-CM

## 2016-09-22 DIAGNOSIS — Z7982 Long term (current) use of aspirin: Secondary | ICD-10-CM | POA: Diagnosis not present

## 2016-09-22 DIAGNOSIS — Z7902 Long term (current) use of antithrombotics/antiplatelets: Secondary | ICD-10-CM | POA: Diagnosis not present

## 2016-09-22 DIAGNOSIS — M818 Other osteoporosis without current pathological fracture: Secondary | ICD-10-CM | POA: Diagnosis not present

## 2016-09-22 DIAGNOSIS — M1990 Unspecified osteoarthritis, unspecified site: Secondary | ICD-10-CM | POA: Diagnosis not present

## 2016-09-22 DIAGNOSIS — M810 Age-related osteoporosis without current pathological fracture: Secondary | ICD-10-CM | POA: Diagnosis not present

## 2016-09-22 DIAGNOSIS — I10 Essential (primary) hypertension: Secondary | ICD-10-CM | POA: Diagnosis not present

## 2016-09-22 LAB — CBC WITH DIFFERENTIAL/PLATELET
BASO%: 0.2 % (ref 0.0–2.0)
Basophils Absolute: 0 10*3/uL (ref 0.0–0.1)
EOS ABS: 0.3 10*3/uL (ref 0.0–0.5)
EOS%: 2.1 % (ref 0.0–7.0)
HCT: 43.3 % (ref 34.8–46.6)
HEMOGLOBIN: 13.3 g/dL (ref 11.6–15.9)
LYMPH#: 0.8 10*3/uL — AB (ref 0.9–3.3)
LYMPH%: 6.2 % — ABNORMAL LOW (ref 14.0–49.7)
MCH: 19.2 pg — ABNORMAL LOW (ref 25.1–34.0)
MCHC: 30.7 g/dL — ABNORMAL LOW (ref 31.5–36.0)
MCV: 62.4 fL — ABNORMAL LOW (ref 79.5–101.0)
MONO#: 0.7 10*3/uL (ref 0.1–0.9)
MONO%: 6 % (ref 0.0–14.0)
NEUT%: 85.5 % — ABNORMAL HIGH (ref 38.4–76.8)
NEUTROS ABS: 10.5 10*3/uL — AB (ref 1.5–6.5)
NRBC: 0 % (ref 0–0)
Platelets: 315 10*3/uL (ref 145–400)
RBC: 6.94 10*6/uL — ABNORMAL HIGH (ref 3.70–5.45)
RDW: 20.4 % — AB (ref 11.2–14.5)
WBC: 12.2 10*3/uL — AB (ref 3.9–10.3)

## 2016-09-22 LAB — FERRITIN: Ferritin: 7 ng/ml — ABNORMAL LOW (ref 9–269)

## 2016-09-22 NOTE — Telephone Encounter (Signed)
Spoke with patient regarding her new appointments. Did not want avs and calendar.

## 2016-09-29 ENCOUNTER — Encounter: Payer: Self-pay | Admitting: Hematology

## 2016-09-30 ENCOUNTER — Telehealth: Payer: Self-pay | Admitting: *Deleted

## 2016-09-30 ENCOUNTER — Other Ambulatory Visit: Payer: Self-pay | Admitting: Hematology

## 2016-09-30 MED ORDER — HYDROXYUREA 500 MG PO CAPS: 500.0000 mg | ORAL_CAPSULE | ORAL | 0 refills | Status: DC

## 2016-09-30 NOTE — Telephone Encounter (Signed)
"  Resend the patient portal message sent to my mother.  She deleted it without reading it." Please disregard.  Unable to resend this automatic appointment no show message in reference to 09-22-2016 phlebotomy, not needed per office note.      "No, there should be a note in reference to the anagrelide.  We asked last week and waiting response from Dr. Burr Medico.  Runs out after she takes the one pill later today.  She fell this morning.  I'm checking her blood pressure and have a check list of what to check to make sure she doesn't need to go to the hospital.  Last week we about resuming Hydrea.   Please let us know If it's worth trying Hydrea every other day, a few days a week or does she have to remain on anagrelide.  "            Routing call information to collaborative nurse and provider for review.  Further patient communication through collaborative nurse.  [name] notified with this call of EPIC note of mom on anagrelide before hydrea initial starting dose started 05-26-2012 through 07-20-2012 at which time Dr. Ralene Ok ordered to hold hydrea for severe low platelet count.   FYI.  Further questions communicated with this call. "She's still experiencing itching, why is she taking anagrelide?  Was there no response with Hydrea?  What Hydrea doses were tried or adjusted by the different doctors she's seen there?  When was she switched to anagrelide.  She's hit the doughnut hole with her Medicare medication supplement so the Anagrelide cost $350.00/month.  She has heart problems and it can be toxic for people with heart problems.  She's 83 yrs old, so weak, only weighs 90 lbs, lives alone, we check on her.  She says she's going to drive.  This is so hard."

## 2016-09-30 NOTE — Telephone Encounter (Signed)
I called the patient's daughter and discussed the option of Hydrea and anagrelide for her polycythemia vera, including potential side effects, especially risk of leukemia from Hydrea, and cardiovascular risk from anagrelide. After lengthy discussion, we decided to change her anagrelide to Hydrea 500 mg every other day, she will start tomorrow. I'll schedule her return in 1 week for lab, lab and follow-up with me in 2 weeks. She voiced good understanding, and agrees to proceed.   Truitt Merle MD

## 2016-10-01 ENCOUNTER — Emergency Department (HOSPITAL_COMMUNITY): Payer: Medicare Other

## 2016-10-01 ENCOUNTER — Telehealth: Payer: Self-pay | Admitting: Hematology

## 2016-10-01 ENCOUNTER — Inpatient Hospital Stay (HOSPITAL_COMMUNITY)
Admission: EM | Admit: 2016-10-01 | Discharge: 2016-10-06 | DRG: 064 | Disposition: A | Discharge status: Skilled Nursing Facility | Payer: Medicare Other | Attending: Family Medicine | Admitting: Family Medicine

## 2016-10-01 ENCOUNTER — Encounter (HOSPITAL_COMMUNITY): Payer: Self-pay | Admitting: *Deleted

## 2016-10-01 DIAGNOSIS — I341 Nonrheumatic mitral (valve) prolapse: Secondary | ICD-10-CM | POA: Diagnosis present

## 2016-10-01 DIAGNOSIS — Z823 Family history of stroke: Secondary | ICD-10-CM

## 2016-10-01 DIAGNOSIS — Z853 Personal history of malignant neoplasm of breast: Secondary | ICD-10-CM

## 2016-10-01 DIAGNOSIS — M810 Age-related osteoporosis without current pathological fracture: Secondary | ICD-10-CM | POA: Diagnosis present

## 2016-10-01 DIAGNOSIS — S4992XA Unspecified injury of left shoulder and upper arm, initial encounter: Secondary | ICD-10-CM | POA: Diagnosis not present

## 2016-10-01 DIAGNOSIS — Z882 Allergy status to sulfonamides status: Secondary | ICD-10-CM

## 2016-10-01 DIAGNOSIS — R29700 NIHSS score 0: Secondary | ICD-10-CM | POA: Diagnosis present

## 2016-10-01 DIAGNOSIS — I6330 Cerebral infarction due to thrombosis of unspecified cerebral artery: Secondary | ICD-10-CM | POA: Insufficient documentation

## 2016-10-01 DIAGNOSIS — Z7982 Long term (current) use of aspirin: Secondary | ICD-10-CM

## 2016-10-01 DIAGNOSIS — M7042 Prepatellar bursitis, left knee: Secondary | ICD-10-CM | POA: Diagnosis present

## 2016-10-01 DIAGNOSIS — M25552 Pain in left hip: Secondary | ICD-10-CM | POA: Diagnosis not present

## 2016-10-01 DIAGNOSIS — S8992XA Unspecified injury of left lower leg, initial encounter: Secondary | ICD-10-CM | POA: Diagnosis not present

## 2016-10-01 DIAGNOSIS — S5012XA Contusion of left forearm, initial encounter: Secondary | ICD-10-CM | POA: Diagnosis not present

## 2016-10-01 DIAGNOSIS — Y92009 Unspecified place in unspecified non-institutional (private) residence as the place of occurrence of the external cause: Secondary | ICD-10-CM

## 2016-10-01 DIAGNOSIS — S8002XA Contusion of left knee, initial encounter: Secondary | ICD-10-CM | POA: Diagnosis not present

## 2016-10-01 DIAGNOSIS — M25562 Pain in left knee: Secondary | ICD-10-CM | POA: Diagnosis not present

## 2016-10-01 DIAGNOSIS — Z86011 Personal history of benign neoplasm of the brain: Secondary | ICD-10-CM

## 2016-10-01 DIAGNOSIS — I639 Cerebral infarction, unspecified: Secondary | ICD-10-CM | POA: Diagnosis not present

## 2016-10-01 DIAGNOSIS — G936 Cerebral edema: Secondary | ICD-10-CM | POA: Diagnosis not present

## 2016-10-01 DIAGNOSIS — I69354 Hemiplegia and hemiparesis following cerebral infarction affecting left non-dominant side: Secondary | ICD-10-CM

## 2016-10-01 DIAGNOSIS — M79602 Pain in left arm: Secondary | ICD-10-CM | POA: Diagnosis not present

## 2016-10-01 DIAGNOSIS — I10 Essential (primary) hypertension: Secondary | ICD-10-CM | POA: Diagnosis not present

## 2016-10-01 DIAGNOSIS — D649 Anemia, unspecified: Secondary | ICD-10-CM

## 2016-10-01 DIAGNOSIS — Z66 Do not resuscitate: Secondary | ICD-10-CM | POA: Diagnosis not present

## 2016-10-01 DIAGNOSIS — I160 Hypertensive urgency: Secondary | ICD-10-CM | POA: Diagnosis present

## 2016-10-01 DIAGNOSIS — I6623 Occlusion and stenosis of bilateral posterior cerebral arteries: Secondary | ICD-10-CM | POA: Diagnosis present

## 2016-10-01 DIAGNOSIS — K219 Gastro-esophageal reflux disease without esophagitis: Secondary | ICD-10-CM | POA: Diagnosis present

## 2016-10-01 DIAGNOSIS — I672 Cerebral atherosclerosis: Secondary | ICD-10-CM | POA: Diagnosis present

## 2016-10-01 DIAGNOSIS — H5340 Unspecified visual field defects: Secondary | ICD-10-CM | POA: Diagnosis present

## 2016-10-01 DIAGNOSIS — D72829 Elevated white blood cell count, unspecified: Secondary | ICD-10-CM | POA: Diagnosis present

## 2016-10-01 DIAGNOSIS — T148XXA Other injury of unspecified body region, initial encounter: Secondary | ICD-10-CM | POA: Diagnosis present

## 2016-10-01 DIAGNOSIS — Z9181 History of falling: Secondary | ICD-10-CM

## 2016-10-01 DIAGNOSIS — R296 Repeated falls: Secondary | ICD-10-CM | POA: Diagnosis present

## 2016-10-01 DIAGNOSIS — Z8249 Family history of ischemic heart disease and other diseases of the circulatory system: Secondary | ICD-10-CM

## 2016-10-01 DIAGNOSIS — S79922A Unspecified injury of left thigh, initial encounter: Secondary | ICD-10-CM | POA: Diagnosis not present

## 2016-10-01 DIAGNOSIS — D45 Polycythemia vera: Secondary | ICD-10-CM | POA: Diagnosis present

## 2016-10-01 DIAGNOSIS — S3993XA Unspecified injury of pelvis, initial encounter: Secondary | ICD-10-CM | POA: Diagnosis not present

## 2016-10-01 DIAGNOSIS — E785 Hyperlipidemia, unspecified: Secondary | ICD-10-CM | POA: Diagnosis present

## 2016-10-01 DIAGNOSIS — M7989 Other specified soft tissue disorders: Secondary | ICD-10-CM | POA: Diagnosis not present

## 2016-10-01 DIAGNOSIS — R05 Cough: Secondary | ICD-10-CM | POA: Diagnosis not present

## 2016-10-01 DIAGNOSIS — W19XXXA Unspecified fall, initial encounter: Secondary | ICD-10-CM | POA: Diagnosis present

## 2016-10-01 DIAGNOSIS — Z79899 Other long term (current) drug therapy: Secondary | ICD-10-CM

## 2016-10-01 DIAGNOSIS — M79605 Pain in left leg: Secondary | ICD-10-CM | POA: Diagnosis not present

## 2016-10-01 DIAGNOSIS — Z87891 Personal history of nicotine dependence: Secondary | ICD-10-CM

## 2016-10-01 DIAGNOSIS — R102 Pelvic and perineal pain: Secondary | ICD-10-CM | POA: Diagnosis not present

## 2016-10-01 DIAGNOSIS — R569 Unspecified convulsions: Secondary | ICD-10-CM | POA: Diagnosis present

## 2016-10-01 DIAGNOSIS — E039 Hypothyroidism, unspecified: Secondary | ICD-10-CM | POA: Diagnosis present

## 2016-10-01 DIAGNOSIS — T07XXXA Unspecified multiple injuries, initial encounter: Secondary | ICD-10-CM

## 2016-10-01 LAB — CBC WITH DIFFERENTIAL/PLATELET
BASOS PCT: 0 %
Basophils Absolute: 0 10*3/uL (ref 0.0–0.1)
EOS PCT: 2 %
Eosinophils Absolute: 0.3 10*3/uL (ref 0.0–0.7)
HEMATOCRIT: 38.8 % (ref 36.0–46.0)
Hemoglobin: 11.2 g/dL — ABNORMAL LOW (ref 12.0–15.0)
LYMPHS ABS: 0.6 10*3/uL — AB (ref 0.7–4.0)
LYMPHS PCT: 4 %
MCH: 18.2 pg — ABNORMAL LOW (ref 26.0–34.0)
MCHC: 28.9 g/dL — ABNORMAL LOW (ref 30.0–36.0)
MCV: 63 fL — AB (ref 78.0–100.0)
MONOS PCT: 5 %
Monocytes Absolute: 0.8 10*3/uL (ref 0.1–1.0)
NEUTROS PCT: 89 %
Neutro Abs: 13.4 10*3/uL — ABNORMAL HIGH (ref 1.7–7.7)
Platelets: 382 10*3/uL (ref 150–400)
RBC: 6.16 MIL/uL — AB (ref 3.87–5.11)
RDW: 20.5 % — AB (ref 11.5–15.5)
WBC: 15.1 10*3/uL — AB (ref 4.0–10.5)

## 2016-10-01 LAB — URINALYSIS, ROUTINE W REFLEX MICROSCOPIC
Bilirubin Urine: NEGATIVE
Glucose, UA: NEGATIVE mg/dL
Hgb urine dipstick: NEGATIVE
Ketones, ur: NEGATIVE mg/dL
Nitrite: NEGATIVE
PH: 5 (ref 5.0–8.0)
Protein, ur: NEGATIVE mg/dL
SPECIFIC GRAVITY, URINE: 1.009 (ref 1.005–1.030)
SQUAMOUS EPITHELIAL / LPF: NONE SEEN

## 2016-10-01 LAB — COMPREHENSIVE METABOLIC PANEL
ALK PHOS: 61 U/L (ref 38–126)
ALT: 10 U/L — AB (ref 14–54)
AST: 20 U/L (ref 15–41)
Albumin: 3.3 g/dL — ABNORMAL LOW (ref 3.5–5.0)
Anion gap: 8 (ref 5–15)
BILIRUBIN TOTAL: 0.8 mg/dL (ref 0.3–1.2)
BUN: 14 mg/dL (ref 6–20)
CALCIUM: 8.3 mg/dL — AB (ref 8.9–10.3)
CO2: 22 mmol/L (ref 22–32)
CREATININE: 1.06 mg/dL — AB (ref 0.44–1.00)
Chloride: 109 mmol/L (ref 101–111)
GFR calc Af Amer: 53 mL/min — ABNORMAL LOW (ref >60)
GFR calc non Af Amer: 45 mL/min — ABNORMAL LOW (ref >60)
Glucose, Bld: 114 mg/dL — ABNORMAL HIGH (ref 65–99)
Potassium: 4.2 mmol/L (ref 3.5–5.1)
Sodium: 139 mmol/L (ref 135–145)
TOTAL PROTEIN: 5.7 g/dL — AB (ref 6.5–8.1)

## 2016-10-01 LAB — PROTIME-INR
INR: 1.2
Prothrombin Time: 15.1 seconds (ref 11.4–15.2)

## 2016-10-01 MED ORDER — IRBESARTAN 150 MG PO TABS
75.0000 mg | ORAL_TABLET | Freq: Every day | ORAL | Status: DC
  Administered 2016-10-01 – 2016-10-02 (×2): 75 mg via ORAL
  Filled 2016-10-01 (×2): qty 1

## 2016-10-01 MED ORDER — LEVETIRACETAM 500 MG PO TABS
500.0000 mg | ORAL_TABLET | Freq: Two times a day (BID) | ORAL | Status: DC
  Administered 2016-10-01 – 2016-10-06 (×10): 500 mg via ORAL
  Filled 2016-10-01 (×10): qty 1

## 2016-10-01 MED ORDER — METOPROLOL SUCCINATE ER 50 MG PO TB24
50.0000 mg | ORAL_TABLET | Freq: Every day | ORAL | Status: DC
  Administered 2016-10-02 – 2016-10-06 (×5): 50 mg via ORAL
  Filled 2016-10-01 (×5): qty 1

## 2016-10-01 MED ORDER — MORPHINE SULFATE (PF) 4 MG/ML IV SOLN
4.0000 mg | Freq: Once | INTRAVENOUS | Status: AC
  Administered 2016-10-01: 4 mg via INTRAVENOUS
  Filled 2016-10-01: qty 1

## 2016-10-01 MED ORDER — LEVOTHYROXINE SODIUM 25 MCG PO TABS
25.0000 ug | ORAL_TABLET | Freq: Every day | ORAL | Status: DC
  Administered 2016-10-02 – 2016-10-06 (×4): 25 ug via ORAL
  Filled 2016-10-01 (×5): qty 1

## 2016-10-01 MED ORDER — MORPHINE SULFATE (PF) 4 MG/ML IV SOLN
1.0000 mg | INTRAVENOUS | Status: DC | PRN
  Administered 2016-10-02: 2 mg via INTRAVENOUS
  Filled 2016-10-01 (×2): qty 1

## 2016-10-01 MED ORDER — PANTOPRAZOLE SODIUM 40 MG PO TBEC
40.0000 mg | DELAYED_RELEASE_TABLET | ORAL | Status: DC | PRN
  Filled 2016-10-01: qty 1

## 2016-10-01 MED ORDER — HYDROXYUREA 500 MG PO CAPS
500.0000 mg | ORAL_CAPSULE | ORAL | Status: DC
  Administered 2016-10-02 – 2016-10-06 (×3): 500 mg via ORAL
  Filled 2016-10-01 (×3): qty 1

## 2016-10-01 MED ORDER — AMLODIPINE BESYLATE 5 MG PO TABS
5.0000 mg | ORAL_TABLET | Freq: Every day | ORAL | Status: DC
  Administered 2016-10-01 – 2016-10-02 (×2): 5 mg via ORAL
  Filled 2016-10-01 (×2): qty 1

## 2016-10-01 MED ORDER — HYDRALAZINE HCL 20 MG/ML IJ SOLN
10.0000 mg | Freq: Four times a day (QID) | INTRAMUSCULAR | Status: DC | PRN
  Administered 2016-10-02: 10 mg via INTRAVENOUS
  Filled 2016-10-01: qty 1

## 2016-10-01 MED ORDER — SODIUM CHLORIDE 0.9 % IV BOLUS (SEPSIS)
500.0000 mL | Freq: Once | INTRAVENOUS | Status: AC
  Administered 2016-10-01: 500 mL via INTRAVENOUS

## 2016-10-01 NOTE — ED Triage Notes (Signed)
Patient presents to ed via [facility] states she has had freq falls, fell twice yest , left knee is very swollen states her left side feels a little weaker. Also c/o neck pain. Son states patient had a medication change yest was taken off her anagrelide to hydrea

## 2016-10-01 NOTE — ED Notes (Signed)
Patient transported to CT 

## 2016-10-01 NOTE — ED Notes (Signed)
Patient had multiple bruising to left arm and left knee is swollen and bruised. Left pedal pulse present.

## 2016-10-01 NOTE — ED Notes (Signed)
Ice pack applied to left knee

## 2016-10-01 NOTE — ED Notes (Signed)
[name], [phone]

## 2016-10-01 NOTE — H&P (Addendum)
History and Physical    [name] PPJ:[id number] DOB: 1926/09/25 DOA: 10/01/2016  PCP: Deland Pretty, MD  Patient coming from: Home.   I have personally briefly reviewed patient's old medical records in [place]  Chief Complaint: fall.   HPI: [name] is a 83 y.o. female with medical history significant of hypertension, gastritis, multiple falls in the past , was brought in by her son for a fall one week ago and a fall today. She was found to have left knee swelling and tenderness and bruising. X rays did not show any fracture , but CT of the knee showed hematoma and pre patellar hemorrhage. No family at bedside to provide further history. Pt is a poor historian. She denies any chest pain or sob, cough or fevers. She wa s referred to medical service for admission.     Review of Systems: As per HPI otherwise 10 point review of systems negative.   Past Medical History:  Diagnosis Date  . Acute blood loss anemia   . Acute encephalopathy   . Altered mental state   . Arthritis   . Atrophic gastritis   . Breast cancer ([place])    right  . Candida esophagitis ([place])   . Chest tightness   . Chronic gastritis   . Closed hip fracture ([place])   . Closed intertrochanteric fracture of left femur with routine healing   . Diverticulosis   . Hypertension   . IBS (irritable bowel syndrome)   . Mitral valve prolapse   . MVP (mitral valve prolapse)   . Osteoporosis   . Palpitation   . Polycythemia   . PVC's (premature ventricular contractions)   . Rhinitis   . Seizures ([place])   . Unsteady gait     Past Surgical History:  Procedure Laterality Date  . ABDOMINAL HYSTERECTOMY     ovaries taken  . APPENDECTOMY    . BREAST LUMPECTOMY     right  . CARDIOVASCULAR STRESS TEST  12/23/2000   EF 70%  . CATARACT EXTRACTION     bilateral  . ELBOW FRACTURE SURGERY     left  . FEMUR IM NAIL Left 05/08/2015   Procedure: INTERTROCHANTERIC FEMORAL NAIL;  Surgeon: Rod Can, MD;   Location: [facility];  Service: Orthopedics;  Laterality: Left;  . HEMATOMA EVACUATION     right  . TONSILLECTOMY    . TRANSTHORACIC ECHOCARDIOGRAM  03/05/2010   EF 55-60%     reports that she quit smoking about 36 years ago. She has never used smokeless tobacco. She reports that she drinks alcohol. She reports that she does not use drugs.  Allergies  Allergen Reactions  . Sulfa Antibiotics     From childhood; patient doesn't recall reaction  . Sulfur     From childhood; patient doesn't recall reaction    Family History  Problem Relation Age of Onset  . Stomach cancer Mother   . Hypertension Father   . Stroke Father   . Prostate cancer Father   . Hypertension Sister   . Hypertension Brother   . Prostate cancer Brother   . Diabetes Brother   . Colon cancer Neg Hx    Reviewed.  Prior to Admission medications   Medication Sig Start Date End Date Taking? Authorizing Provider  aspirin 81 MG tablet Take 81 mg by mouth daily.     Yes [provider]  Calcium Carbonate-Vitamin D (CALCIUM 500/D PO) Take 1 tablet by mouth daily.  Yes [provider]  cetirizine (ZYRTEC) 10 MG tablet Take 10 mg by mouth daily as needed for allergies.   Yes [provider]  fluticasone (FLONASE) 50 MCG/ACT nasal spray Place 2 sprays into both nostrils as needed for allergies or rhinitis. Reported on 06/11/2015   Yes [provider]  levETIRAcetam (KEPPRA) 500 MG tablet Take 1 tablet (500 mg total) by mouth 2 (two) times daily. 09/03/16  Yes Penumalli, Earlean Polka, MD  levothyroxine (SYNTHROID, LEVOTHROID) 25 MCG tablet Take 25 mcg by mouth daily before breakfast.  08/27/16  Yes [provider]  loperamide (IMODIUM) 2 MG capsule Take 2 mg by mouth as needed for diarrhea or loose stools.   Yes [provider]  metoprolol succinate (TOPROL-XL) 50 MG 24 hr tablet TAKE 1 TABLET ONCE DAILY WITH FOOD. Patient taking differently: Take 50 mg by mouth once a day 07/19/16   Yes Belva Crome, MD  olmesartan (BENICAR) 20 MG tablet Take 1 tablet (20 mg total) by mouth daily. 08/19/16  Yes Nahser, Wonda Cheng, MD  Probiotic Product ([place] PO) Take 1 tablet by mouth daily. Reported on 07/01/2015   Yes [provider]  hydroxyurea (HYDREA) 500 MG capsule Take 1 capsule (500 mg total) by mouth every other day. May take with food to minimize GI side effects. 09/30/16   Truitt Merle, MD    Physical Exam: Vitals:   10/01/16 1800 10/01/16 1815 10/01/16 1830 10/01/16 1845  BP: (!) 180/65 (!) 192/86 (!) 185/74 (!) 180/78  Pulse: 77 91 80 68  Resp: 11 10 10 13   Temp:      TempSrc:      SpO2: 93% 97% 95% 93%    Constitutional: NAD, calm, comfortable Vitals:   10/01/16 1800 10/01/16 1815 10/01/16 1830 10/01/16 1845  BP: (!) 180/65 (!) 192/86 (!) 185/74 (!) 180/78  Pulse: 77 91 80 68  Resp: 11 10 10 13   Temp:      TempSrc:      SpO2: 93% 97% 95% 93%   Eyes: PERRL, lids and conjunctivae normal ENMT: Mucous membranes are moist. Posterior pharynx clear of any exudate or lesions.Normal dentition.  Neck: normal, supple, no masses, no thyromegaly Respiratory: clear to auscultation bilaterally, no wheezing, no crackles. Normal respiratory effort. No accessory muscle use.  Cardiovascular: Regular rate and rhythm, no murmurs / rubs / gallops. No extremity edema. 2+ pedal pulses. No carotid bruits.  Abdomen: no tenderness, no masses palpated. No hepatosplenomegaly. Bowel sounds positive.  Musculoskeletal: bruising over the left arm and left knee hematoma with bruising and swelling and tenderness. .  Skin: no rashes, lesions, ulcers. No induration Neurologic: CN 2-12 grossly intact. Sensation intact, DTR normal. Strength 5/5 in all 4.  Psychiatric: Normal judgment and insight. Alert and oriented x 3. Normal mood.   (  Labs on Admission: I have personally reviewed following labs and imaging studies  CBC:  Recent Labs Lab 10/01/16 1025  WBC 15.1*   NEUTROABS 13.4*  HGB 11.2*  HCT 38.8  MCV 63.0*  PLT 782   Basic Metabolic Panel:  Recent Labs Lab 10/01/16 1025  NA 139  K 4.2  CL 109  CO2 22  GLUCOSE 114*  BUN 14  CREATININE 1.06*  CALCIUM 8.3*   GFR: Estimated Creatinine Clearance: 24.1 mL/min (A) (by C-G formula based on SCr of 1.06 mg/dL (H)). Liver Function Tests:  Recent Labs Lab 10/01/16 1025  AST 20  ALT 10*  ALKPHOS 61  BILITOT 0.8  PROT 5.7*  ALBUMIN 3.3*   No results for input(s): LIPASE, AMYLASE in the last 168 hours. No results for input(s): AMMONIA in the last 168 hours. Coagulation Profile:  Recent Labs Lab 10/01/16 1025  INR 1.20   Cardiac Enzymes: No results for input(s): CKTOTAL, CKMB, CKMBINDEX, TROPONINI in the last 168 hours. BNP (last 3 results) No results for input(s): PROBNP in the last 8760 hours. HbA1C: No results for input(s): HGBA1C in the last 72 hours. CBG: No results for input(s): GLUCAP in the last 168 hours. Lipid Profile: No results for input(s): CHOL, HDL, LDLCALC, TRIG, CHOLHDL, LDLDIRECT in the last 72 hours. Thyroid Function Tests: No results for input(s): TSH, T4TOTAL, FREET4, T3FREE, THYROIDAB in the last 72 hours. Anemia Panel: No results for input(s): VITAMINB12, FOLATE, FERRITIN, TIBC, IRON, RETICCTPCT in the last 72 hours. Urine analysis:    Component Value Date/Time   COLORURINE YELLOW 10/01/2016 [place] 10/01/2016 1035   LABSPEC 1.009 10/01/2016 1035   PHURINE 5.0 10/01/2016 1035   GLUCOSEU NEGATIVE 10/01/2016 1035   HGBUR NEGATIVE 10/01/2016 [place] 10/01/2016 [place] 10/01/2016 1035   PROTEINUR NEGATIVE 10/01/2016 1035   UROBILINOGEN 0.2 08/27/2006 2231   NITRITE NEGATIVE 10/01/2016 1035   LEUKOCYTESUR TRACE (A) 10/01/2016 1035    Radiological Exams on Admission: Dg Chest 2 View  Result Date: 10/01/2016 CLINICAL DATA:  Fall.  Cough. EXAM: CHEST  2 VIEW COMPARISON:  07/25/2016.  FINDINGS: Normal heart size. No pleural effusion or edema. The lungs are hyperinflated but clear. No airspace opacities identified. The bones appear diffusely osteopenic. IMPRESSION: 1. Lungs appear hyperinflated but clear. No posttraumatic abnormalities identified. Electronically Signed   By: Kerby Moors M.D.   On: 10/01/2016 11:44   Dg Pelvis 1-2 Views  Result Date: 10/01/2016 CLINICAL DATA:  Pain and swelling secondary to a fall today. EXAM: PELVIS - 1-2 VIEW COMPARISON:  05/08/2015 FINDINGS: There is no acute fracture. There is an old deformity of the left pubic body and left inferior and superior pubic rami with healed fractures. There is an old healed intertrochanteric fracture of the proximal left femur. Screw and intramedullary nail in place in the proximal left femur. IMPRESSION: No acute abnormalities. Electronically Signed   By: Lorriane Shire M.D.   On: 10/01/2016 11:54   Dg Forearm Left  Result Date: 10/01/2016 CLINICAL DATA:  Left arm bruising secondary to a fall today. EXAM: LEFT FOREARM - 2 VIEW COMPARISON:  Radiographs dated 06/19/2004 FINDINGS: There is no acute fracture or dislocation or joint effusion. Previous open reduction and internal fixation of olecranon fracture which is now completely healed. K-wire and cerclage wire in place. IMPRESSION: No acute abnormalities. Electronically Signed   By: Lorriane Shire M.D.   On: 10/01/2016 11:53   Dg Tibia/fibula Left  Result Date: 10/01/2016 CLINICAL DATA:  Leg pain secondary to a fall today. EXAM: LEFT TIBIA AND FIBULA - 2 VIEW COMPARISON:  None. FINDINGS: There is no evidence of fracture or other focal bone lesions. Prominent soft tissue swelling anterior to the patella. IMPRESSION: No acute bone abnormality. Soft tissue swelling anterior to the patella. Electronically Signed   By: Lorriane Shire M.D.   On: 10/01/2016 11:50   Ct Head Wo Contrast  Result Date: 10/01/2016 CLINICAL DATA:  Multiple falls and abrasions. Back pain.  Initial encounter. EXAM: CT HEAD WITHOUT CONTRAST CT CERVICAL SPINE WITHOUT CONTRAST TECHNIQUE: Multidetector CT imaging of the head and cervical spine was performed following  the standard protocol without intravenous contrast. Multiplanar CT image reconstructions of the cervical spine were also generated. COMPARISON:  Head CT 07/24/2016 FINDINGS: CT HEAD FINDINGS Brain: No evidence of acute infarction, hemorrhage, hydrocephalus, or extra-axial collection. There is cerebral atrophy, prominent in the medial temporal lobes, a pattern that can be seen with Alzheimer's disease. Chronic small vessel ischemia with extensive cerebral white matter low-density. There is an isodense mass on the midline anterior cranial fossa floor best seen on reformats and measures 9 x 16 mm. There is also a calcified extra-axial mass along the left middle cranial fossa floor measuring 12 mm. No associated significant mass effect or brain edema. Vascular: Atherosclerotic calcification. Skull: Negative for fracture Sinuses/Orbits: No evidence of injury. Bilateral cataract resection. CT CERVICAL SPINE FINDINGS Alignment: No traumatic malalignment. Skull base and vertebrae: Negative for fracture. Soft tissues and spinal canal: No prevertebral fluid or swelling. No visible canal hematoma. Disc levels: C4-5, C5-6, and C6-7 advanced disc degeneration. No evidence of cord impingement. Upper chest: No evidence of injury. IMPRESSION: 1. No evidence of intracranial or cervical spine injury. 2. Atrophy and extensive chronic small vessel ischemia. 3. Known small meningiomas in the anterior and left middle cranial fossae. No significant mass effect or brain edema. Electronically Signed   By: Monte Fantasia M.D.   On: 10/01/2016 10:59   Ct Cervical Spine Wo Contrast  Result Date: 10/01/2016 CLINICAL DATA:  Multiple falls and abrasions. Back pain. Initial encounter. EXAM: CT HEAD WITHOUT CONTRAST CT CERVICAL SPINE WITHOUT CONTRAST TECHNIQUE:  Multidetector CT imaging of the head and cervical spine was performed following the standard protocol without intravenous contrast. Multiplanar CT image reconstructions of the cervical spine were also generated. COMPARISON:  Head CT 07/24/2016 FINDINGS: CT HEAD FINDINGS Brain: No evidence of acute infarction, hemorrhage, hydrocephalus, or extra-axial collection. There is cerebral atrophy, prominent in the medial temporal lobes, a pattern that can be seen with Alzheimer's disease. Chronic small vessel ischemia with extensive cerebral white matter low-density. There is an isodense mass on the midline anterior cranial fossa floor best seen on reformats and measures 9 x 16 mm. There is also a calcified extra-axial mass along the left middle cranial fossa floor measuring 12 mm. No associated significant mass effect or brain edema. Vascular: Atherosclerotic calcification. Skull: Negative for fracture Sinuses/Orbits: No evidence of injury. Bilateral cataract resection. CT CERVICAL SPINE FINDINGS Alignment: No traumatic malalignment. Skull base and vertebrae: Negative for fracture. Soft tissues and spinal canal: No prevertebral fluid or swelling. No visible canal hematoma. Disc levels: C4-5, C5-6, and C6-7 advanced disc degeneration. No evidence of cord impingement. Upper chest: No evidence of injury. IMPRESSION: 1. No evidence of intracranial or cervical spine injury. 2. Atrophy and extensive chronic small vessel ischemia. 3. Known small meningiomas in the anterior and left middle cranial fossae. No significant mass effect or brain edema. Electronically Signed   By: Monte Fantasia M.D.   On: 10/01/2016 10:59   Ct Lumbar Spine Wo Contrast  Result Date: 10/01/2016 CLINICAL DATA:  Low back pain.  Multiple falls. Initial encounter. EXAM: CT LUMBAR SPINE WITHOUT CONTRAST TECHNIQUE: Multidetector CT imaging of the lumbar spine was performed without intravenous contrast administration. Multiplanar CT image reconstructions  were also generated. COMPARISON:  05/17/2014 lumbar spine MRI FINDINGS: Segmentation: Standard Alignment: Moderate levoscoliosis centered at L2. Vertebrae: Negative for acute fracture. Remote L1 superior endplate and L2 compression fractures. Height loss is moderate and retropulsion is mild at L2. Remote left twelfth rib and L1/L2 left transverse process fractures.  Bilateral sacroiliac osteoarthritis with vacuum phenomenon. Paraspinal and other soft tissues: No acute finding. Partly seen bladder is moderately distended. Extensive sigmoid diverticulosis. Disc levels: T12- L1: No evidence of impingement L1-L2: Disc degeneration with narrowing and bulging. Negative facets. Mild bilateral subarticular recess and spinal stenosis due to prior retropulsion. L2-L3: Disc degeneration with vacuum phenomenon throughout degenerative disc clefts. The disc is narrowed and bulging with inferior foraminal narrowing that is noncompressive. Patent spinal canal. L3-L4: Degenerative disc narrowing and circumferential bulging. Inferior foraminal narrowing without compression. L4-L5: Disc narrowing and bulging. Asymmetric left degenerative facet spurring and ligamentum flavum thickening. Mild left subarticular recess narrowing. Mild left foraminal narrowing. L5-S1:Chronic pars defect on the right. Mild disc narrowing and desiccation. Left foraminal disc bulge with L5 impingement. Patent spinal canal. IMPRESSION: 1. No acute finding. 2. Remote L1 and L2 compression fractures. 3. Scoliosis and spinal degeneration. Notable impingement at the left L5 S1 foramen due to disc bulge. 4. L5 chronic right pars defect. Electronically Signed   By: Monte Fantasia M.D.   On: 10/01/2016 11:08   Ct Knee Left Wo Contrast  Result Date: 10/01/2016 CLINICAL DATA:  Left knee pain after fall. EXAM: CT OF THE LEFT KNEE WITHOUT CONTRAST TECHNIQUE: Multidetector CT imaging of the left knee was performed according to the standard protocol. Multiplanar CT  image reconstructions were also generated. COMPARISON:  Left knee x-rays from same day. FINDINGS: Bones/Joint/Cartilage No acute fracture or malalignment. Partially visualized distal femoral intramedullary rod. Mild tricompartmental degenerative changes of the knee. Chondrocalcinosis. No joint effusion. Ligaments Suboptimally assessed by CT. Muscles and Tendons No focal abnormality. Soft tissues Large hyperdense fluid collection over the anterior knee, more prominent on the medial aspect. The collection measures approximately 3.4 x 7.9 x 10.3 cm (AP by transverse by CC). There is prominent soft tissue swelling about the medial and lateral knee. IMPRESSION: 1. Large hyperdense fluid collection over the anterior knee, more prominent on the medial aspect, consistent with hematoma. Findings likely represent hemorrhagic prepatellar bursitis. 2. No acute osseous abnormality of the knee. Mild tricompartmental degenerative changes. Electronically Signed   By: Titus Dubin M.D.   On: 10/01/2016 16:29   Dg Knee Complete 4 Views Left  Result Date: 10/01/2016 CLINICAL DATA:  Knee pain secondary to a fall today. EXAM: LEFT KNEE - COMPLETE 4+ VIEW COMPARISON:  05/08/2015 FINDINGS: There is no fracture or dislocation. There is prominent soft tissue swelling anterior to the patella. Diffuse osteopenia. Chondrocalcinosis. Tiny marginal osteophytes. IMPRESSION: No acute bone abnormality.  Arthritic changes as described. Prominent soft tissue swelling anterior to the patella. Electronically Signed   By: Lorriane Shire M.D.   On: 10/01/2016 11:49   Dg Humerus Left  Result Date: 10/01/2016 CLINICAL DATA:  Left arm pain secondary to a fall today. EXAM: LEFT HUMERUS - 2+ VIEW COMPARISON:  None. FINDINGS: There is no evidence of fracture or other focal bone lesions. Soft tissues are unremarkable. IMPRESSION: Normal left humerus. Electronically Signed   By: Lorriane Shire M.D.   On: 10/01/2016 11:51   Dg Femur Min 2 Views  Left  Result Date: 10/01/2016 CLINICAL DATA:  Left hip pain secondary to a fall today. EXAM: LEFT FEMUR 2 VIEWS COMPARISON:  Radiographs dated 05/08/2015 FINDINGS: There is no acute fracture. No dislocation. Old healed intertrochanteric fracture of the proximal left femur. Old pubic fractures. Intramedullary nail and screws in place in the femur. Marked anterior soft tissue swelling over the patella. IMPRESSION: No acute abnormality of the left femur. Soft tissue  swelling over the patella. Electronically Signed   By: Lorriane Shire M.D.   On: 10/01/2016 11:47    EKG: Independently reviewed.   Assessment/Plan Active Problems:   Fall MULTIPLE RECURRENT FALLS: Without any presyncopal dizziness. No loss of consciousness.  She was told she had seizures and was started on anti epileptic drugs.  Orthostatics ordered.  PT evaluation.    Polycyathemia Vera: Would explain his leukocytosis, elevated RBC count.  Resume hydroxyurea.    Hypertensive urgency:  Restart home meds.  Prn hydralazine.   Seizures:   on keppra.  Left knee hematoma:  CT showed  Large hyperdense fluid collection over the anterior knee, more prominent on the medial aspect. The collection measures approximately 3.4 x 7.9 x 10.3 cm (AP by transverse by CC). There is prominent soft tissue swelling about the medial and lateral knee. Orthopedics consulted by EDP, recommended ice pack and ace bandage.     DVT prophylaxis: SCD;S Code Status: DNR Family Communication: discussed with daughter at bedside.  Disposition Plan: PENDING pt EVALUATION.  Consults called: orthopedics by EDP.  Admission status: obs tele.    Hosie Poisson MD [facility] Pager [phone]  If 7PM-7AM, please contact night-coverage [email or web address] Password [facility]  10/01/2016, 7:44 PM

## 2016-10-01 NOTE — ED Provider Notes (Signed)
[facility] DEPT Provider Note   CSN: [id number] Arrival date & time: 10/01/16  0905     History   Chief Complaint Chief Complaint  Patient presents with  . Fall    HPI [name] is a 83 y.o. female.  The history is provided by the patient, medical records and a relative. No language interpreter was used.  Fall  This is a recurrent problem. The current episode started yesterday. The problem occurs constantly. The problem has not changed since onset.Associated symptoms include headaches. Pertinent negatives include no chest pain, no abdominal pain and no shortness of breath. Nothing relieves the symptoms. She has tried nothing for the symptoms. The treatment provided no relief.    Past Medical History:  Diagnosis Date  . Acute blood loss anemia   . Acute encephalopathy   . Altered mental state   . Arthritis   . Atrophic gastritis   . Breast cancer ([place])    right  . Candida esophagitis ([place])   . Chest tightness   . Chronic gastritis   . Closed hip fracture ([place])   . Closed intertrochanteric fracture of left femur with routine healing   . Diverticulosis   . Hypertension   . IBS (irritable bowel syndrome)   . Mitral valve prolapse   . MVP (mitral valve prolapse)   . Osteoporosis   . Palpitation   . Polycythemia   . PVC's (premature ventricular contractions)   . Rhinitis   . Seizures ([place])   . Unsteady gait     Patient Active Problem List   Diagnosis Date Noted  . Memory loss 09/03/2016  . Fever 07/27/2016  . Acute encephalopathy 07/26/2016  . Protein-calorie malnutrition, severe 07/26/2016  . Altered mental status   . Seizure ([place])   . Hypertensive urgency 07/25/2016  . Foot swelling 04/28/2016  . History of breast cancer in female 08/13/2015  . Osteoporosis 07/01/2015  . Dehydration 06/11/2015  . Acute blood loss anemia   . Thrombocytosis ([place])   . Faintness   . Closed left hip fracture ([place]) 05/08/2015  . Leukocytosis 05/08/2015  . Fracture,  intertrochanteric, left femur ([place]) 05/08/2015  . HTN (hypertension) 08/27/2010  . Mitral valve prolapse 08/27/2010  . Malignant neoplasm of female breast ([place]) 06/20/2007  . Polycythemia vera ([place]) 06/20/2007  . ABDOMINAL PAIN, EPIGASTRIC 06/20/2007  . Atrophic gastritis 06/19/2007  . DIVERTICULOSIS OF COLON 06/19/2007  . ARTHRITIS 06/19/2007    Past Surgical History:  Procedure Laterality Date  . ABDOMINAL HYSTERECTOMY     ovaries taken  . APPENDECTOMY    . BREAST LUMPECTOMY     right  . CARDIOVASCULAR STRESS TEST  12/23/2000   EF 70%  . CATARACT EXTRACTION     bilateral  . ELBOW FRACTURE SURGERY     left  . FEMUR IM NAIL Left 05/08/2015   Procedure: INTERTROCHANTERIC FEMORAL NAIL;  Surgeon: Rod Can, MD;  Location: [facility];  Service: Orthopedics;  Laterality: Left;  . HEMATOMA EVACUATION     right  . TONSILLECTOMY    . TRANSTHORACIC ECHOCARDIOGRAM  03/05/2010   EF 55-60%    OB History    No data available       Home Medications    Prior to Admission medications   Medication Sig Start Date End Date Taking? Authorizing Provider  amLODipine (NORVASC) 5 MG tablet  08/13/16   [provider]  anagrelide (AGRYLIN) 1 MG capsule Take 2 capsules (2 mg total) by mouth 2 (two) times daily. 05/05/16  Truitt Merle, MD  aspirin 81 MG tablet Take 81 mg by mouth daily.      [provider]  Calcium Carbonate-Vitamin D (CALCIUM 500/D PO) Take 1 capsule by mouth daily.    [provider]  Cetirizine HCl (ZYRTEC PO) Take 1 tablet by mouth as needed (AS NEEDED FOR ALLERGIES).     [provider]  fluticasone (FLONASE) 50 MCG/ACT nasal spray Place 2 sprays into the nose as needed for allergies or rhinitis. Reported on 06/11/2015    [provider]  hydroxyurea (HYDREA) 500 MG capsule Take 1 capsule (500 mg total) by mouth every other day. May take with food to minimize GI side effects. 09/30/16   Truitt Merle, MD  levETIRAcetam (KEPPRA) 500 MG  tablet Take 1 tablet (500 mg total) by mouth 2 (two) times daily. 09/03/16   Penumalli, Earlean Polka, MD  levothyroxine (SYNTHROID, LEVOTHROID) 25 MCG tablet  08/27/16   [provider]  loperamide (IMODIUM) 2 MG capsule Take 2 mg by mouth as needed for diarrhea or loose stools.    [provider]  metoprolol succinate (TOPROL-XL) 50 MG 24 hr tablet TAKE 1 TABLET ONCE DAILY WITH FOOD. 07/19/16   Belva Crome, MD  olmesartan (BENICAR) 20 MG tablet Take 1 tablet (20 mg total) by mouth daily. 08/19/16   Nahser, Wonda Cheng, MD  pantoprazole (PROTONIX) 40 MG tablet Take 40 mg by mouth as needed (AS NEEDED FOR STOMACH).     [provider]  Probiotic Product ([place]) Take 1 tablet by mouth daily. Reported on 07/01/2015    [provider]    Family History Family History  Problem Relation Age of Onset  . Stomach cancer Mother   . Hypertension Father   . Stroke Father   . Prostate cancer Father   . Hypertension Sister   . Hypertension Brother   . Prostate cancer Brother   . Diabetes Brother   . Colon cancer Neg Hx     Social History Social History  Substance Use Topics  . Smoking status: Former Smoker    Quit date: 08/24/1980  . Smokeless tobacco: Never Used  . Alcohol use Yes     Comment: occasional     Allergies   Sulfur   Review of Systems Review of Systems  Constitutional: Negative for chills, fatigue and fever.  HENT: Negative for congestion and rhinorrhea.   Eyes: Negative for visual disturbance.  Respiratory: Positive for cough. Negative for chest tightness, shortness of breath, wheezing and stridor.   Cardiovascular: Positive for leg swelling (L knee swelling). Negative for chest pain and palpitations.  Gastrointestinal: Positive for diarrhea. Negative for abdominal pain, constipation, nausea and vomiting.  Genitourinary: Negative for dysuria and flank pain.  Musculoskeletal: Positive for back pain and neck pain. Negative  for neck stiffness.  Skin: Positive for color change. Negative for wound.  Neurological: Positive for headaches. Negative for weakness, light-headedness and numbness.  Hematological: Bruises/bleeds easily.  Psychiatric/Behavioral: Negative for agitation and confusion.  All other systems reviewed and are negative.    Physical Exam Updated Vital Signs BP (!) 202/86 (BP Location: Right Arm)   Pulse 79   Temp 98.3 F (36.8 C) (Oral)   Resp 14   SpO2 97%   Physical Exam  Constitutional: She is oriented to person, place, and time. She appears well-developed and well-nourished. No distress.  HENT:  Head: Normocephalic.    Nose: Nose normal.  Mouth/Throat: Oropharynx is clear and moist. No  oropharyngeal exudate.  Eyes: Pupils are equal, round, and reactive to light. Conjunctivae and EOM are normal.  Neck: Normal range of motion.  Cardiovascular: Normal rate, normal heart sounds and intact distal pulses.   No murmur heard. Pulmonary/Chest: Effort normal. No stridor. No respiratory distress. She has no wheezes. She has no rales. She exhibits no tenderness.  Abdominal: Soft. There is no tenderness.  Musculoskeletal: She exhibits tenderness.       Arms: Ecchymoses on R arm.   Neurological: She is alert and oriented to person, place, and time. No sensory deficit. She exhibits normal muscle tone.  Skin: Skin is warm. Capillary refill takes less than 2 seconds. She is not diaphoretic.  Psychiatric: She has a normal mood and affect.  Nursing note and vitals reviewed.          ED Treatments / Results  Labs (all labs ordered are listed, but only abnormal results are displayed) Labs Reviewed  CBC WITH DIFFERENTIAL/PLATELET - Abnormal; Notable for the following:       Result Value   WBC 15.1 (*)    RBC 6.16 (*)    Hemoglobin 11.2 (*)    MCV 63.0 (*)    MCH 18.2 (*)    MCHC 28.9 (*)    RDW 20.5 (*)    Neutro Abs 13.4 (*)    Lymphs Abs 0.6 (*)    All other components within  normal limits  COMPREHENSIVE METABOLIC PANEL - Abnormal; Notable for the following:    Glucose, Bld 114 (*)    Creatinine, Ser 1.06 (*)    Calcium 8.3 (*)    Total Protein 5.7 (*)    Albumin 3.3 (*)    ALT 10 (*)    GFR calc non Af Amer 45 (*)    GFR calc Af Amer 53 (*)    All other components within normal limits  URINALYSIS, ROUTINE W REFLEX MICROSCOPIC - Abnormal; Notable for the following:    Leukocytes, UA TRACE (*)    Bacteria, UA RARE (*)    All other components within normal limits  PROTIME-INR    EKG  EKG Interpretation  Date/Time:  Friday October 01 2016 09:09:47 EDT Ventricular Rate:  83 PR Interval:    QRS Duration: 80 QT Interval:  410 QTC Calculation: 482 R Axis:   56 Text Interpretation:  Sinus rhythm when compared to prior, different ST morphology but no evidence of STEMI.  Confirmed by Antony Blackbird [phone]) on 10/01/2016 10:33:49 AM       Radiology Dg Chest 2 View  Result Date: 10/01/2016 CLINICAL DATA:  Fall.  Cough. EXAM: CHEST  2 VIEW COMPARISON:  07/25/2016. FINDINGS: Normal heart size. No pleural effusion or edema. The lungs are hyperinflated but clear. No airspace opacities identified. The bones appear diffusely osteopenic. IMPRESSION: 1. Lungs appear hyperinflated but clear. No posttraumatic abnormalities identified. Electronically Signed   By: Kerby Moors M.D.   On: 10/01/2016 11:44   Dg Pelvis 1-2 Views  Result Date: 10/01/2016 CLINICAL DATA:  Pain and swelling secondary to a fall today. EXAM: PELVIS - 1-2 VIEW COMPARISON:  05/08/2015 FINDINGS: There is no acute fracture. There is an old deformity of the left pubic body and left inferior and superior pubic rami with healed fractures. There is an old healed intertrochanteric fracture of the proximal left femur. Screw and intramedullary nail in place in the proximal left femur. IMPRESSION: No acute abnormalities. Electronically Signed   By: Lorriane Shire M.D.   On: 10/01/2016 11:54  Dg Forearm  Left  Result Date: 10/01/2016 CLINICAL DATA:  Left arm bruising secondary to a fall today. EXAM: LEFT FOREARM - 2 VIEW COMPARISON:  Radiographs dated 06/19/2004 FINDINGS: There is no acute fracture or dislocation or joint effusion. Previous open reduction and internal fixation of olecranon fracture which is now completely healed. K-wire and cerclage wire in place. IMPRESSION: No acute abnormalities. Electronically Signed   By: Lorriane Shire M.D.   On: 10/01/2016 11:53   Dg Tibia/fibula Left  Result Date: 10/01/2016 CLINICAL DATA:  Leg pain secondary to a fall today. EXAM: LEFT TIBIA AND FIBULA - 2 VIEW COMPARISON:  None. FINDINGS: There is no evidence of fracture or other focal bone lesions. Prominent soft tissue swelling anterior to the patella. IMPRESSION: No acute bone abnormality. Soft tissue swelling anterior to the patella. Electronically Signed   By: Lorriane Shire M.D.   On: 10/01/2016 11:50   Ct Head Wo Contrast  Result Date: 10/01/2016 CLINICAL DATA:  Multiple falls and abrasions. Back pain. Initial encounter. EXAM: CT HEAD WITHOUT CONTRAST CT CERVICAL SPINE WITHOUT CONTRAST TECHNIQUE: Multidetector CT imaging of the head and cervical spine was performed following the standard protocol without intravenous contrast. Multiplanar CT image reconstructions of the cervical spine were also generated. COMPARISON:  Head CT 07/24/2016 FINDINGS: CT HEAD FINDINGS Brain: No evidence of acute infarction, hemorrhage, hydrocephalus, or extra-axial collection. There is cerebral atrophy, prominent in the medial temporal lobes, a pattern that can be seen with Alzheimer's disease. Chronic small vessel ischemia with extensive cerebral white matter low-density. There is an isodense mass on the midline anterior cranial fossa floor best seen on reformats and measures 9 x 16 mm. There is also a calcified extra-axial mass along the left middle cranial fossa floor measuring 12 mm. No associated significant mass effect or  brain edema. Vascular: Atherosclerotic calcification. Skull: Negative for fracture Sinuses/Orbits: No evidence of injury. Bilateral cataract resection. CT CERVICAL SPINE FINDINGS Alignment: No traumatic malalignment. Skull base and vertebrae: Negative for fracture. Soft tissues and spinal canal: No prevertebral fluid or swelling. No visible canal hematoma. Disc levels: C4-5, C5-6, and C6-7 advanced disc degeneration. No evidence of cord impingement. Upper chest: No evidence of injury. IMPRESSION: 1. No evidence of intracranial or cervical spine injury. 2. Atrophy and extensive chronic small vessel ischemia. 3. Known small meningiomas in the anterior and left middle cranial fossae. No significant mass effect or brain edema. Electronically Signed   By: Monte Fantasia M.D.   On: 10/01/2016 10:59   Ct Cervical Spine Wo Contrast  Result Date: 10/01/2016 CLINICAL DATA:  Multiple falls and abrasions. Back pain. Initial encounter. EXAM: CT HEAD WITHOUT CONTRAST CT CERVICAL SPINE WITHOUT CONTRAST TECHNIQUE: Multidetector CT imaging of the head and cervical spine was performed following the standard protocol without intravenous contrast. Multiplanar CT image reconstructions of the cervical spine were also generated. COMPARISON:  Head CT 07/24/2016 FINDINGS: CT HEAD FINDINGS Brain: No evidence of acute infarction, hemorrhage, hydrocephalus, or extra-axial collection. There is cerebral atrophy, prominent in the medial temporal lobes, a pattern that can be seen with Alzheimer's disease. Chronic small vessel ischemia with extensive cerebral white matter low-density. There is an isodense mass on the midline anterior cranial fossa floor best seen on reformats and measures 9 x 16 mm. There is also a calcified extra-axial mass along the left middle cranial fossa floor measuring 12 mm. No associated significant mass effect or brain edema. Vascular: Atherosclerotic calcification. Skull: Negative for fracture Sinuses/Orbits: No  evidence of injury. Bilateral  cataract resection. CT CERVICAL SPINE FINDINGS Alignment: No traumatic malalignment. Skull base and vertebrae: Negative for fracture. Soft tissues and spinal canal: No prevertebral fluid or swelling. No visible canal hematoma. Disc levels: C4-5, C5-6, and C6-7 advanced disc degeneration. No evidence of cord impingement. Upper chest: No evidence of injury. IMPRESSION: 1. No evidence of intracranial or cervical spine injury. 2. Atrophy and extensive chronic small vessel ischemia. 3. Known small meningiomas in the anterior and left middle cranial fossae. No significant mass effect or brain edema. Electronically Signed   By: Monte Fantasia M.D.   On: 10/01/2016 10:59   Ct Lumbar Spine Wo Contrast  Result Date: 10/01/2016 CLINICAL DATA:  Low back pain.  Multiple falls. Initial encounter. EXAM: CT LUMBAR SPINE WITHOUT CONTRAST TECHNIQUE: Multidetector CT imaging of the lumbar spine was performed without intravenous contrast administration. Multiplanar CT image reconstructions were also generated. COMPARISON:  05/17/2014 lumbar spine MRI FINDINGS: Segmentation: Standard Alignment: Moderate levoscoliosis centered at L2. Vertebrae: Negative for acute fracture. Remote L1 superior endplate and L2 compression fractures. Height loss is moderate and retropulsion is mild at L2. Remote left twelfth rib and L1/L2 left transverse process fractures. Bilateral sacroiliac osteoarthritis with vacuum phenomenon. Paraspinal and other soft tissues: No acute finding. Partly seen bladder is moderately distended. Extensive sigmoid diverticulosis. Disc levels: T12- L1: No evidence of impingement L1-L2: Disc degeneration with narrowing and bulging. Negative facets. Mild bilateral subarticular recess and spinal stenosis due to prior retropulsion. L2-L3: Disc degeneration with vacuum phenomenon throughout degenerative disc clefts. The disc is narrowed and bulging with inferior foraminal narrowing that is  noncompressive. Patent spinal canal. L3-L4: Degenerative disc narrowing and circumferential bulging. Inferior foraminal narrowing without compression. L4-L5: Disc narrowing and bulging. Asymmetric left degenerative facet spurring and ligamentum flavum thickening. Mild left subarticular recess narrowing. Mild left foraminal narrowing. L5-S1:Chronic pars defect on the right. Mild disc narrowing and desiccation. Left foraminal disc bulge with L5 impingement. Patent spinal canal. IMPRESSION: 1. No acute finding. 2. Remote L1 and L2 compression fractures. 3. Scoliosis and spinal degeneration. Notable impingement at the left L5 S1 foramen due to disc bulge. 4. L5 chronic right pars defect. Electronically Signed   By: Monte Fantasia M.D.   On: 10/01/2016 11:08   Ct Knee Left Wo Contrast  Result Date: 10/01/2016 CLINICAL DATA:  Left knee pain after fall. EXAM: CT OF THE LEFT KNEE WITHOUT CONTRAST TECHNIQUE: Multidetector CT imaging of the left knee was performed according to the standard protocol. Multiplanar CT image reconstructions were also generated. COMPARISON:  Left knee x-rays from same day. FINDINGS: Bones/Joint/Cartilage No acute fracture or malalignment. Partially visualized distal femoral intramedullary rod. Mild tricompartmental degenerative changes of the knee. Chondrocalcinosis. No joint effusion. Ligaments Suboptimally assessed by CT. Muscles and Tendons No focal abnormality. Soft tissues Large hyperdense fluid collection over the anterior knee, more prominent on the medial aspect. The collection measures approximately 3.4 x 7.9 x 10.3 cm (AP by transverse by CC). There is prominent soft tissue swelling about the medial and lateral knee. IMPRESSION: 1. Large hyperdense fluid collection over the anterior knee, more prominent on the medial aspect, consistent with hematoma. Findings likely represent hemorrhagic prepatellar bursitis. 2. No acute osseous abnormality of the knee. Mild tricompartmental  degenerative changes. Electronically Signed   By: Titus Dubin M.D.   On: 10/01/2016 16:29   Dg Knee Complete 4 Views Left  Result Date: 10/01/2016 CLINICAL DATA:  Knee pain secondary to a fall today. EXAM: LEFT KNEE - COMPLETE 4+ VIEW COMPARISON:  05/08/2015 FINDINGS: There is no fracture or dislocation. There is prominent soft tissue swelling anterior to the patella. Diffuse osteopenia. Chondrocalcinosis. Tiny marginal osteophytes. IMPRESSION: No acute bone abnormality.  Arthritic changes as described. Prominent soft tissue swelling anterior to the patella. Electronically Signed   By: Lorriane Shire M.D.   On: 10/01/2016 11:49   Dg Humerus Left  Result Date: 10/01/2016 CLINICAL DATA:  Left arm pain secondary to a fall today. EXAM: LEFT HUMERUS - 2+ VIEW COMPARISON:  None. FINDINGS: There is no evidence of fracture or other focal bone lesions. Soft tissues are unremarkable. IMPRESSION: Normal left humerus. Electronically Signed   By: Lorriane Shire M.D.   On: 10/01/2016 11:51   Dg Femur Min 2 Views Left  Result Date: 10/01/2016 CLINICAL DATA:  Left hip pain secondary to a fall today. EXAM: LEFT FEMUR 2 VIEWS COMPARISON:  Radiographs dated 05/08/2015 FINDINGS: There is no acute fracture. No dislocation. Old healed intertrochanteric fracture of the proximal left femur. Old pubic fractures. Intramedullary nail and screws in place in the femur. Marked anterior soft tissue swelling over the patella. IMPRESSION: No acute abnormality of the left femur. Soft tissue swelling over the patella. Electronically Signed   By: Lorriane Shire M.D.   On: 10/01/2016 11:47    Procedures Procedures (including critical care time)  Medications Ordered in ED Medications  hydrALAZINE (APRESOLINE) injection 10 mg (not administered)  morphine 4 MG/ML injection 1-2 mg (not administered)  morphine 4 MG/ML injection 4 mg (4 mg Intravenous Given 10/01/16 1028)  sodium chloride 0.9 % bolus 500 mL (0 mLs Intravenous  Stopped 10/01/16 1935)  morphine 4 MG/ML injection 4 mg (4 mg Intravenous Given 10/01/16 1640)     Initial Impression / Assessment and Plan / ED Course  I have reviewed the triage vital signs and the nursing notes.  Pertinent labs & imaging results that were available during my care of the patient were reviewed by me and considered in my medical decision making (see chart for details).     [name] is a 83 y.o. female with a past medical history significant for polycythemia vera, hypertension, prior breast cancer, seizures, and diverticulosis who presents with multiple falls and left-sided pain. Patient says that last week, she had a mechanical fall hitting her head on the concrete. She reports that she did not lose consciousness at that time but a neighbor had to help get her up. She says she had headaches for several days after that but no vision changes not become more vomiting. She says that yesterday she had 2 falls. She describes them both as mechanical. She says that while in the den with her walker, she slipped falling onto her left side. She reports onset of left knee pain, left hip pain, low back pain, neck pain, and mild headache. She denies loss of consciousness during that fall. She says that she had a history of a left femur fracture that required surgery in the past. She denies any fevers, chills, chest pain, shortness of breath, nausea, vomiting, constipation, or dysuria. She does report some mild diarrhea. She says that she has had a small nonproductive cough for the last few days. She describes her pain in her neck as moderate and on the left superior aspect. She denies any bleeding from injuries on her head. She describes her low back pain as aching and moderate. She describes her left leg pain is severe. She denies numbness, tingling, or weakness of the left leg.  On  exam, patient has marked swelling of the left knee. Patient has significant bruising in the left knee and left  arm. Patient has palpable DP pulse on the left leg. Patient has normal sensation and strength in the foot. Patient has pain with range of motion of the left knee. Patient has some distal femoral tenderness and some mild tenderness in the left hip joint. Patient had minimal tenderness in the lumbar spine but had pain. No thoracic tenderness. No chest tenderness. Lungs clear. Abdomen nontender. Patient had mild tenderness of the upper left paraspinal neck. No midline tenderness. No focal neurologic deficits were appreciated.  Given recurrent falls, patient will have workup for occult infection leading to instability. She will also have imaging to look for traumatic injuries including head, neck, chest, left arm, left leg including left knee, and low back. Patient will have screening laboratory testing given her polycythemia vera and significant bruising.  Anticipate reassessment following workup. Patient will be given fluids and pain medicine during work up.  [facility] laboratory testing shows that patient seemed 11 has dropped from 13.3 to 11.2. And the knee swelling, there is concern this may be from her expanding knee hematoma. Patient also has similar leukocytosis to prior. In absence of urinary symptoms, do not feel the patient has a UTI with no nitrites. Metabolic panel showed similar creatinine to prior.    Initial x-rays showed no evidence of fracture or dislocation. Patient was reassessed and her knee appears to have continued to grow. Given the clinical concern for an occult fracture or expanding hematoma, CT imaging was ordered to further evaluate.   Anticipate following up on CT imaging to look for fracture. Patient has severe pain and expanding hematoma with dropping hemoglobin raising concern for safety discharging patient home where she lives alone.   5:45 PM  Orthopedics will be called as CT scan shows a hemorrhagic bursitis. Orthopedics recommended ice and Ace wrap. They agreed with  admission to hospitalist service for PT/OT, pain control, and hemoglobin monitoring.  Patient will need reassessment for possible left-sided weakness after pain is better controlled and swelling has improved. Patient did not have appreciable weakness in the left arm or left leg on my initial evaluation.  Hospitalist team called and will but the patient for further management.     Final Clinical Impressions(s) / ED Diagnoses   Final diagnoses:  Hemorrhagic bursitis, prepatellar, left  Fall, initial encounter  Anemia, unspecified type  Multiple contusions    Clinical Impression: 1. Hemorrhagic bursitis, prepatellar, left   2. Fall   3. Fall, initial encounter   4. Anemia, unspecified type   5. Multiple contusions     Disposition: Admit to Hospitalist service    Tegeler, Gwenyth Allegra, MD 10/01/16 2023

## 2016-10-01 NOTE — Telephone Encounter (Signed)
lvm to inform pt of 9/7 and 9/14 appts per sch msg

## 2016-10-01 NOTE — ED Notes (Signed)
EDP at bedside  

## 2016-10-02 ENCOUNTER — Observation Stay (HOSPITAL_COMMUNITY): Payer: Medicare Other

## 2016-10-02 ENCOUNTER — Encounter (HOSPITAL_COMMUNITY): Payer: Self-pay

## 2016-10-02 ENCOUNTER — Observation Stay (HOSPITAL_BASED_OUTPATIENT_CLINIC_OR_DEPARTMENT_OTHER): Payer: Medicare Other

## 2016-10-02 DIAGNOSIS — G464 Cerebellar stroke syndrome: Secondary | ICD-10-CM | POA: Diagnosis not present

## 2016-10-02 DIAGNOSIS — M810 Age-related osteoporosis without current pathological fracture: Secondary | ICD-10-CM | POA: Diagnosis present

## 2016-10-02 DIAGNOSIS — I672 Cerebral atherosclerosis: Secondary | ICD-10-CM | POA: Diagnosis present

## 2016-10-02 DIAGNOSIS — I6330 Cerebral infarction due to thrombosis of unspecified cerebral artery: Secondary | ICD-10-CM | POA: Diagnosis not present

## 2016-10-02 DIAGNOSIS — I6623 Occlusion and stenosis of bilateral posterior cerebral arteries: Secondary | ICD-10-CM | POA: Diagnosis present

## 2016-10-02 DIAGNOSIS — I639 Cerebral infarction, unspecified: Secondary | ICD-10-CM | POA: Diagnosis present

## 2016-10-02 DIAGNOSIS — Y92009 Unspecified place in unspecified non-institutional (private) residence as the place of occurrence of the external cause: Secondary | ICD-10-CM | POA: Diagnosis not present

## 2016-10-02 DIAGNOSIS — I10 Essential (primary) hypertension: Secondary | ICD-10-CM | POA: Diagnosis present

## 2016-10-02 DIAGNOSIS — M6281 Muscle weakness (generalized): Secondary | ICD-10-CM | POA: Diagnosis not present

## 2016-10-02 DIAGNOSIS — D649 Anemia, unspecified: Secondary | ICD-10-CM | POA: Diagnosis not present

## 2016-10-02 DIAGNOSIS — R29700 NIHSS score 0: Secondary | ICD-10-CM | POA: Diagnosis present

## 2016-10-02 DIAGNOSIS — G8190 Hemiplegia, unspecified affecting unspecified side: Secondary | ICD-10-CM | POA: Diagnosis not present

## 2016-10-02 DIAGNOSIS — E785 Hyperlipidemia, unspecified: Secondary | ICD-10-CM | POA: Diagnosis present

## 2016-10-02 DIAGNOSIS — T07XXXA Unspecified multiple injuries, initial encounter: Secondary | ICD-10-CM | POA: Diagnosis not present

## 2016-10-02 DIAGNOSIS — R569 Unspecified convulsions: Secondary | ICD-10-CM | POA: Diagnosis present

## 2016-10-02 DIAGNOSIS — M1990 Unspecified osteoarthritis, unspecified site: Secondary | ICD-10-CM | POA: Diagnosis not present

## 2016-10-02 DIAGNOSIS — S8002XA Contusion of left knee, initial encounter: Secondary | ICD-10-CM | POA: Diagnosis present

## 2016-10-02 DIAGNOSIS — G936 Cerebral edema: Secondary | ICD-10-CM | POA: Diagnosis present

## 2016-10-02 DIAGNOSIS — Z7982 Long term (current) use of aspirin: Secondary | ICD-10-CM | POA: Diagnosis not present

## 2016-10-02 DIAGNOSIS — R55 Syncope and collapse: Secondary | ICD-10-CM | POA: Diagnosis not present

## 2016-10-02 DIAGNOSIS — Z853 Personal history of malignant neoplasm of breast: Secondary | ICD-10-CM | POA: Diagnosis not present

## 2016-10-02 DIAGNOSIS — T148XXA Other injury of unspecified body region, initial encounter: Secondary | ICD-10-CM | POA: Diagnosis present

## 2016-10-02 DIAGNOSIS — R488 Other symbolic dysfunctions: Secondary | ICD-10-CM | POA: Diagnosis not present

## 2016-10-02 DIAGNOSIS — K219 Gastro-esophageal reflux disease without esophagitis: Secondary | ICD-10-CM | POA: Diagnosis present

## 2016-10-02 DIAGNOSIS — W19XXXA Unspecified fall, initial encounter: Secondary | ICD-10-CM | POA: Diagnosis present

## 2016-10-02 DIAGNOSIS — S728X9A Other fracture of unspecified femur, initial encounter for closed fracture: Secondary | ICD-10-CM | POA: Diagnosis not present

## 2016-10-02 DIAGNOSIS — R1319 Other dysphagia: Secondary | ICD-10-CM | POA: Diagnosis not present

## 2016-10-02 DIAGNOSIS — W19XXXS Unspecified fall, sequela: Secondary | ICD-10-CM | POA: Diagnosis not present

## 2016-10-02 DIAGNOSIS — E039 Hypothyroidism, unspecified: Secondary | ICD-10-CM | POA: Diagnosis present

## 2016-10-02 DIAGNOSIS — Z66 Do not resuscitate: Secondary | ICD-10-CM | POA: Diagnosis present

## 2016-10-02 DIAGNOSIS — R278 Other lack of coordination: Secondary | ICD-10-CM | POA: Diagnosis not present

## 2016-10-02 DIAGNOSIS — W19XXXD Unspecified fall, subsequent encounter: Secondary | ICD-10-CM | POA: Diagnosis not present

## 2016-10-02 DIAGNOSIS — I69354 Hemiplegia and hemiparesis following cerebral infarction affecting left non-dominant side: Secondary | ICD-10-CM | POA: Diagnosis not present

## 2016-10-02 DIAGNOSIS — I351 Nonrheumatic aortic (valve) insufficiency: Secondary | ICD-10-CM | POA: Diagnosis not present

## 2016-10-02 DIAGNOSIS — I6339 Cerebral infarction due to thrombosis of other cerebral artery: Secondary | ICD-10-CM | POA: Diagnosis not present

## 2016-10-02 DIAGNOSIS — I160 Hypertensive urgency: Secondary | ICD-10-CM | POA: Diagnosis present

## 2016-10-02 DIAGNOSIS — D45 Polycythemia vera: Secondary | ICD-10-CM | POA: Diagnosis present

## 2016-10-02 DIAGNOSIS — R296 Repeated falls: Secondary | ICD-10-CM | POA: Diagnosis present

## 2016-10-02 DIAGNOSIS — Z87891 Personal history of nicotine dependence: Secondary | ICD-10-CM | POA: Diagnosis not present

## 2016-10-02 DIAGNOSIS — M7042 Prepatellar bursitis, left knee: Secondary | ICD-10-CM | POA: Diagnosis not present

## 2016-10-02 DIAGNOSIS — D72829 Elevated white blood cell count, unspecified: Secondary | ICD-10-CM | POA: Diagnosis present

## 2016-10-02 DIAGNOSIS — Z4789 Encounter for other orthopedic aftercare: Secondary | ICD-10-CM | POA: Diagnosis not present

## 2016-10-02 LAB — BASIC METABOLIC PANEL
Anion gap: 8 (ref 5–15)
BUN: 14 mg/dL (ref 6–20)
CO2: 21 mmol/L — ABNORMAL LOW (ref 22–32)
Calcium: 7.9 mg/dL — ABNORMAL LOW (ref 8.9–10.3)
Chloride: 106 mmol/L (ref 101–111)
Creatinine, Ser: 0.95 mg/dL (ref 0.44–1.00)
GFR calc Af Amer: >60 mL/min (ref >60)
GFR calc non Af Amer: 52 mL/min — ABNORMAL LOW (ref >60)
Glucose, Bld: 108 mg/dL — ABNORMAL HIGH (ref 65–99)
Potassium: 4.3 mmol/L (ref 3.5–5.1)
Sodium: 135 mmol/L (ref 135–145)

## 2016-10-02 LAB — HEMOGLOBIN A1C
Hgb A1c MFr Bld: 6 % — ABNORMAL HIGH (ref 4.8–5.6)
Mean Plasma Glucose: 125.5 mg/dL

## 2016-10-02 LAB — CBC
HCT: 37.6 % (ref 36.0–46.0)
Hemoglobin: 10.8 g/dL — ABNORMAL LOW (ref 12.0–15.0)
MCH: 17.9 pg — ABNORMAL LOW (ref 26.0–34.0)
MCHC: 28.7 g/dL — ABNORMAL LOW (ref 30.0–36.0)
MCV: 62.5 fL — ABNORMAL LOW (ref 78.0–100.0)
Platelets: 435 10*3/uL — ABNORMAL HIGH (ref 150–400)
RBC: 6.02 MIL/uL — ABNORMAL HIGH (ref 3.87–5.11)
RDW: 20.3 % — ABNORMAL HIGH (ref 11.5–15.5)
WBC: 18.8 10*3/uL — ABNORMAL HIGH (ref 4.0–10.5)

## 2016-10-02 LAB — ECHOCARDIOGRAM COMPLETE

## 2016-10-02 MED ORDER — ASPIRIN 325 MG PO TABS
325.0000 mg | ORAL_TABLET | Freq: Every day | ORAL | Status: DC
  Administered 2016-10-02 – 2016-10-06 (×5): 325 mg via ORAL
  Filled 2016-10-02 (×5): qty 1

## 2016-10-02 MED ORDER — IOPAMIDOL (ISOVUE-370) INJECTION 76%
INTRAVENOUS | Status: AC
  Administered 2016-10-02: 50 mL
  Filled 2016-10-02: qty 50

## 2016-10-02 MED ORDER — IRBESARTAN 150 MG PO TABS
75.0000 mg | ORAL_TABLET | Freq: Every day | ORAL | Status: DC
  Administered 2016-10-03 – 2016-10-05 (×3): 75 mg via ORAL
  Filled 2016-10-02 (×3): qty 1

## 2016-10-02 NOTE — Consult Note (Signed)
Neurology Consultation Reason for Consult: Stroke Referring Physician: Venia Minks  CC: Falls  History is obtained from:PAtient  HPI: [name] is a 83 y.o. female with a history of previous stroke who presents with increased falls. She states that her left side is been weak "for about a year" but seems little worse recently. She has difficulty giving me an exact time of onset for these symptoms.   LKW: Unclear time of onset tpa given?: no, unclear time of onset, the likely more than a week  ROS: A 14 point ROS was performed and is negative except as noted in the HPI.   Past Medical History:  Diagnosis Date  . Acute blood loss anemia   . Acute encephalopathy   . Altered mental state   . Arthritis   . Atrophic gastritis   . Breast cancer ([place])    right  . Candida esophagitis ([place])   . Chest tightness   . Chronic gastritis   . Closed hip fracture ([place])   . Closed intertrochanteric fracture of left femur with routine healing   . Diverticulosis   . Hypertension   . IBS (irritable bowel syndrome)   . Mitral valve prolapse   . MVP (mitral valve prolapse)   . Osteoporosis   . Palpitation   . Polycythemia   . PVC's (premature ventricular contractions)   . Rhinitis   . Seizures ([place])   . Unsteady gait      Family History  Problem Relation Age of Onset  . Stomach cancer Mother   . Hypertension Father   . Stroke Father   . Prostate cancer Father   . Hypertension Sister   . Hypertension Brother   . Prostate cancer Brother   . Diabetes Brother   . Colon cancer Neg Hx      Social History:  reports that she quit smoking about 36 years ago. She has never used smokeless tobacco. She reports that she drinks alcohol. She reports that she does not use drugs.   Exam: Current vital signs: BP (!) 148/56   Pulse 73   Temp 98.4 F (36.9 C) (Oral)   Resp 14   SpO2 92%  Vital signs in last 24 hours: Temp:  [98.4 F (36.9 C)] 98.4 F (36.9 C) (08/31 2100) Pulse Rate:   [64-91] 73 (08/31 2237) Resp:  [10-15] 14 (08/31 2237) BP: (148-197)/(56-95) 148/56 (09/01 0700) SpO2:  [91 %-100 %] 92 % (08/31 2237)   Physical Exam  Constitutional: Appears well-developed and well-nourished.  Psych: Affect appropriate to situation Eyes: No scleral injection HENT: No OP obstrucion Head: Normocephalic.  Cardiovascular: Normal rate and regular rhythm.  Respiratory: Effort normal and breath sounds normal to anterior ascultation GI: Soft.  No distension. There is no tenderness.  Skin: WDI  Neuro: Mental Status: Patient is awake, alert, interactive and appropriate Patient is able to give a clear and coherent history No signs of aphasia  Though she does not extinguish to double simultaneous stimulation, she does not appear to attend to the left side as well as the right. Cranial Nerves: II: Left hemianopia. Pupils are equal, round, and reactive to light.   III,IV, VI: She has a right gaze preference, though she does cross midline to the left slightly, does not have full versions in that direction  V: Facial sensation is symmetric to temperature VII: Facial movement is decreased on the left VIII: hearing is intact to voice X: Uvula elevates symmetrically XI: Shoulder shrug is symmetric. XII: tongue  is midline without atrophy or fasciculations.  Motor: Tone is normal. Bulk is normal. She has good strength on the right, 3/5 in both the left arm and leg Sensory: Sensation is symmetric to light touch and temperature in the arms and legs. She does not extinguish to double simultaneous stimulation Cerebellar: No ataxia on finger-nose-finger on the right  I have reviewed labs in epic and the results pertinent to this consultation are: Cr 1.06  I have reviewed the images obtained:MRI brain- PCA infarct on the right including thalamic infarct.   Impression: 83 year old female with posterior circulation infarct. Though this could be large vessel atherosclerosis, my  suspicion is that it was embolic in nature. She will need to be worked up for such.  Recommendations: 1. HgbA1c, fasting lipid panel 2. CTA head and neck 3. Frequent neuro checks 4. Echocardiogram 5. Prophylactic therapy-Antiplatelet med: Aspirin - dose 325mg  PO or 300mg  PR 6. Risk factor modification 7. Telemetry monitoring 8. PT consult, OT consult, Speech consult 9. please page stroke NP  Or  PA  Or MD  from 8am -4 pm as this patient will be followed by the stroke team at this point.   You can look them up on [email or web address]     Roland Rack, MD [facility] [phone]  If 7pm- 7am, please page neurology on call as listed in [place].

## 2016-10-02 NOTE — Progress Notes (Signed)
Occupational Therapy Evaluation Patient Details Name: [name] MRN: [id number] DOB: [birth year] Today's Date: 10/02/2016    History of Present Illness 83 yo female with onset of mult recent falls was admitted after sustaining a hematoma on L knee and in subpatellar area. MRI: Rt occipital lobe and right lateral thalamus/posterior limb of internal capsule CVA   PMHx:  meningiomas, L1 L 2 compression fractures, acute encephalopathy, breast CA, HTN, seizures, osteoporosis, hip fracture,    Clinical Impression   This 83 yo female admitted and found to have above presents to acute OT with deficits below (see OT problem list) thus affecting her PLOF of living entirely on her own. She will benefit from acute OT with follow up OT at SNF to work back towards her independent level.    Follow Up Recommendations  SNF;Supervision/Assistance - 24 hour    Equipment Recommendations  Other (comment) (TBD at next venue)       Precautions / Restrictions Precautions Precautions:  Fall Precaution Comments:  Pt is having pain with reduced WB tolerance on LLE Restrictions Weight Bearing Restrictions:  No      Mobility Bed Mobility Overal bed mobility: Needs Assistance Bed Mobility: Supine to Sit     Supine to sit:  Mod assist        Transfers Overall transfer level:  Needs assistance Equipment used: 1 person hand held assist Transfers:  Sit to/from [employer] Transfers Sit to Stand: Mod assist Stand pivot transfers:  Max assist            Balance Overall balance assessment:  History of Falls;Needs assistance Sitting-balance support:  Single extremity supported;Feet supported Sitting balance-Leahy Scale:  Poor   Postural control: Left lateral lean (and anterior) Standing balance support: Bilateral upper extremity supported Standing balance-Leahy Scale: Zero                             ADL either performed or assessed with clinical judgement   ADL Overall  ADL's :  Needs assistance/impaired Eating/Feeding:  Moderate assistance (supported sitting)   Grooming:  Moderate assistance (supported sitting)   Upper Body Bathing:  Moderate assistance (supported sitting)   Lower Body Bathing: Maximal assistance (mod A sit<>stand and max A to maintain)   Upper Body Dressing :  Total assistance (supported sitting)   Lower Body Dressing: Total assistance (mod A sit<>stand, max A to maintain standing)   Toilet Transfer:  Maximal assistance;Stand-pivot   Toileting- Clothing Manipulation and Hygiene: Total assistance (mod A sit<>stand, Max A to maintain standing)               Vision Baseline Vision/History:  Wears glasses Wears Glasses:  Reading only Patient Visual Report:  No change from baseline Vision Assessment?: Yes Eye Alignment:  Impaired (comment) Ocular Range of Motion:  Restricted on the left Alignment/Gaze Preference:  Gaze right Tracking/Visual Pursuits:  Decreased smoothness of eye movement to LEFT superior field;Decreased smoothness of eye movement to LEFT inferior field Visual Fields:  Left homonymous hemianopsia; left inattention            Pertinent Vitals/Pain Faces Pain Scale:  Hurts little more Pain Location:  left knee     Hand Dominance Right   Extremity/Trunk Assessment Upper Extremity Assessment LUE Deficits / Details:  Can raise arm up ~30 degrees with elbow bent, gross grasp, but cannot mantain grasp LUE Coordination:  decreased fine motor;decreased gross motor  Communication Communication Communication:  No difficulties   Cognition Arousal/Alertness: Awake/alert Behavior During Therapy: Flat affect Overall Cognitive Status: Within Functional Limits for tasks assessed                                                Home Living Family/patient expects to be discharged to::  Skilled nursing facility                                        Prior  Functioning/Environment Level of Independence:  Independent                 OT Problem List:  Decreased strength;Decreased range of motion;Impaired balance (sitting and/or standing);Impaired vision/perception;Decreased coordination;Decreased cognition;Decreased safety awareness;Decreased knowledge of use of DME or AE;Impaired sensation;Impaired UE functional use      OT Treatment/Interventions: Self-care/ADL training;Therapeutic exercise;Neuromuscular education;DME and/or AE instruction;Therapeutic activities;Cognitive remediation/compensation;Visual/perceptual remediation/compensation;Patient/family education;Balance training    OT Goals(Current goals can be found in the care plan section) Acute Rehab OT Goals Patient Stated Goal:  none stated OT Goal Formulation:  With patient/family Time For Goal Achievement: 10/16/16 Potential to Achieve Goals:  Good  OT Frequency:  Min 2X/week   Barriers to D/C:  Decreased caregiver support             AM-PAC PT "6 Clicks" Daily Activity     Outcome Measure Help from another person eating meals?:  A Lot Help from another person taking care of personal grooming?: A Lot Help from another person toileting, which includes using toliet, bedpan, or urinal?:  Total Help from another person bathing (including washing, rinsing, drying)?: A Lot Help from another person to put on and taking off regular upper body clothing?:  Total Help from another person to put on and taking off regular lower body clothing?: Total 6 Click Score:  9   End of Session Equipment Utilized During Treatment:  Gait belt Nurse Communication:  Mobility status (NT)  Activity Tolerance: Patient tolerated treatment well Patient left:  in chair;with call bell/phone within reach;with chair alarm set;with family/visitor present  OT Visit Diagnosis:  Unsteadiness on feet (R26.81);Other abnormalities of gait and mobility (R26.89);Repeated falls (R29.6);Muscle weakness  (generalized) (M62.81);History of falling (Z91.81);Low vision, both eyes (H54.2);Other symptoms and signs involving cognitive function;Other symptoms and signs involving the nervous system (R29.898);Hemiplegia and hemiparesis Hemiplegia - Right/Left: Left Hemiplegia - dominant/non-dominant:  Non-Dominant Hemiplegia - caused by:  Cerebral infarction                Time: 1191-4782 OT Time Calculation (min): 32 min Charges:  OT General Charges $OT Visit: 1 Visit OT Treatments $Self Care/Home Management : 8-22 mins  [place], OTR/L [phone] 10/02/2016   10/02/2016, 9:57 PM  .

## 2016-10-02 NOTE — Progress Notes (Signed)
SLP Cancellation Note  Patient Details Name: LONI DELBRIDGE MRN: [id number] DOB: 11/23/1926   Cancelled treatment:       Reason Eval/Treat Not Completed: Patient at procedure or test/unavailable. Will reattempt bedside swallow evaluation later today as schedule allows.  Deneise Lever, [place], [place] Speech-Language Pathologist [phone]   Aliene Altes 10/02/2016, 1:40 PM

## 2016-10-02 NOTE — Progress Notes (Signed)
PROGRESS NOTE    [name]  QQV:[id number] DOB: 07-30-1926 DOA: 10/01/2016 PCP: Deland Pretty, MD    Brief Narrative: [name] is a 83 y.o. female with medical history significant of hypertension, gastritis, multiple falls in the past , was brought in by her son for a fall one week ago and a fall today, multiple falls voer the 2 months. MRI brain shows Acute/early subacute infarction involving the right occipital lobe and separately the right lateral thalamus/posterior limb of internal capsule. Reduced diffusion in right hippocampus may represent infarction or secondary seizure activity.  Assessment & Plan:   Active Problems:   Fall   Acute stroke of the right occipital lobe/ right lateral thalamus:  Stroke work up in progress, neurology consulted and recommendations given.    Left knee hematoma:  Ace bandage and ice.  PT evaluation.    Hypertension:  Well controlled.   GERD:  On protonix.    Seizures:  Resume Keppra.    H/o polycythemia vera:  Resume hydroxyurea.    Hypothyroidism:  Resume synthroid.    DVT prophylaxis:scd's Code Status:DNR Family Communication: none at bedside, discussed with son at bedside.  Disposition Plan:  Pending PT eval.   Consultants:   Neurology.    Procedures: MRI brain   Echocardiogram.   CT head and neck.    Antimicrobials: none    Subjective: No new complaints. No chest pain or sob.    Objective: Vitals:   10/01/16 2100 10/01/16 2237 10/02/16 0700 10/02/16 1542  BP: (!) 176/71 (!) 167/58 (!) 148/56 (!) 173/63  Pulse: 76 73  80  Resp: 14 14  16   Temp: 98.4 F (36.9 C)   98.4 F (36.9 C)  TempSrc: Oral   Oral  SpO2: 91% 92%  98%    Intake/Output Summary (Last 24 hours) at 10/02/16 1742 Last data filed at 10/02/16 1544  Gross per 24 hour  Intake              360 ml  Output                0 ml  Net              360 ml   There were no vitals filed for this visit.  Examination:  General  exam: Appears calm and comfortable  Respiratory system: Clear to auscultation. Respiratory effort normal. Cardiovascular system: S1 & S2 heard, RRR. No JVD, murmurs, rubs, gallops or clicks. No pedal edema. Gastrointestinal system: Abdomen is nondistended, soft and nontender. No organomegaly or masses felt. Normal bowel sounds heard. Central nervous system: Alert and oriented. No focal neurological deficits. Extremities: Symmetric 5 x 5 power. Skin: No rashes, lesions or ulcers Psychiatry: Judgement and insight appear normal. Mood & affect appropriate.     Data Reviewed: I have personally reviewed following labs and imaging studies  CBC:  Recent Labs Lab 10/01/16 1025 10/02/16 1143  WBC 15.1* 18.8*  NEUTROABS 13.4*  --   HGB 11.2* 10.8*  HCT 38.8 37.6  MCV 63.0* 62.5*  PLT 382 332*   Basic Metabolic Panel:  Recent Labs Lab 10/01/16 1025 10/02/16 1143  NA 139 135  K 4.2 4.3  CL 109 106  CO2 22 21*  GLUCOSE 114* 108*  BUN 14 14  CREATININE 1.06* 0.95  CALCIUM 8.3* 7.9*   GFR: Estimated Creatinine Clearance: 26.9 mL/min (by C-G formula based on SCr of 0.95 mg/dL). Liver Function Tests:  Recent Labs Lab 10/01/16 1025  AST  20  ALT 10*  ALKPHOS 61  BILITOT 0.8  PROT 5.7*  ALBUMIN 3.3*   No results for input(s): LIPASE, AMYLASE in the last 168 hours. No results for input(s): AMMONIA in the last 168 hours. Coagulation Profile:  Recent Labs Lab 10/01/16 1025  INR 1.20   Cardiac Enzymes: No results for input(s): CKTOTAL, CKMB, CKMBINDEX, TROPONINI in the last 168 hours. BNP (last 3 results) No results for input(s): PROBNP in the last 8760 hours. HbA1C:  Recent Labs  10/02/16 1143  HGBA1C 6.0*   CBG: No results for input(s): GLUCAP in the last 168 hours. Lipid Profile: No results for input(s): CHOL, HDL, LDLCALC, TRIG, CHOLHDL, LDLDIRECT in the last 72 hours. Thyroid Function Tests: No results for input(s): TSH, T4TOTAL, FREET4, T3FREE, THYROIDAB  in the last 72 hours. Anemia Panel: No results for input(s): VITAMINB12, FOLATE, FERRITIN, TIBC, IRON, RETICCTPCT in the last 72 hours. Sepsis Labs: No results for input(s): PROCALCITON, LATICACIDVEN in the last 168 hours.  No results found for this or any previous visit (from the past 240 hour(s)).       Radiology Studies: Ct Angio Head W Or Wo Contrast  Result Date: 10/02/2016 CLINICAL DATA:  83 year old female with recent syncope and falls. Acute right PCA infarct on brain MRI today. 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 EXAM: CT ANGIOGRAPHY HEAD AND NECK TECHNIQUE: Multidetector CT imaging of the head and neck was performed using the standard protocol during bolus administration of intravenous contrast. Multiplanar CT image reconstructions and MIPs were obtained to evaluate the vascular anatomy. Carotid stenosis measurements (when applicable) are obtained utilizing NASCET criteria, using the distal internal carotid diameter as the denominator. CONTRAST:  50 mL Isovue 370 COMPARISON:  Brain MRI 0213 hours today. CT head and cervical spine 10/01/2016 and earlier. FINDINGS: CT HEAD Brain: Cytotoxic edema in the right thalamus and occipital lobe as expected given the diffusion abnormality on MRI earlier today. No associated hemorrhage or mass effect. Elsewhere stable bilateral confluent white matter T2 and FLAIR hyperintensity. Stable ventricle size and configuration. No acute intracranial hemorrhage identified. Calvarium and skull base: Stable and negative. Paranasal sinuses: Visualized paranasal sinuses and mastoids are stable and well pneumatized. Orbits: Stable orbit and scalp soft tissues. CTA NECK Skeleton: No acute osseous abnormality identified. Upper chest: Negative lung apices. No superior mediastinal lymphadenopathy. Other neck: Negative.  No lymphadenopathy. Aortic arch: Ectatic aortic arch with a mildly kinked appearance. No arch atherosclerosis. No great  vessel origin stenosis. Right carotid system: Normal aside from ectasia and minimal calcified plaque at the anterior right ICA bulb. Left carotid system: Normal aside from ectasia. Vertebral arteries:No proximal subclavian artery plaque or stenosis. Normal vertebral artery origins. Fairly codominant vertebral arteries are patent to the skullbase without stenosis. CTA HEAD Posterior circulation: Normal distal vertebral arteries. Normal PICA origins and vertebrobasilar junction. Normal basilar artery. Normal SCA origins. The left PCA origin is patent. The left P1 segment is tortuous there is probably a diminutive left posterior communicating artery. There is a severe stenosis of the left PCA distal P1 are proximal to or proximal P2 segment with preserved distal enhancement (series 15 image 19). On the right side a fetal type right PCA is suggested, but there is evidence of superimposed right P1 segment thrombosis just beyond its origin (series 13, image 113). The right P2 segment is patent, but there is a moderate to severe stenosis at the right P3 branching (series 15, image 19). There is preserved but attenuated distal right  PCA enhancement. Anterior circulation: Both ICA siphons are patent and normal aside from minimal calcified plaque, greater on the left. Normal ophthalmic and posterior communicating artery origins. Normal carotid termini, MCA and ACA origins. Anterior communicating artery and proximal ACA branches are within normal limits. There is mild to moderate left greater than right distal ACA irregularity and stenosis, maximal in the pericallosal artery region (series 21, image 15). Left MCA M1 segment and bifurcation are normal. Right MCA M1 segment and bifurcation are normal. There is minimal bilateral distal MCA branch irregularity. Venous sinuses: Patent. Anatomic variants: Possible fetal type right PCA origin, uncertain. Delayed phase: No abnormal enhancement identified. Review of the MIP images  confirms the above findings IMPRESSION: 1. Positive for a right P1 segment occlusion, but reconstituted right PCA flow via the right posterior communicating artery. Superimposed severe bilateral PCA stenosis, and on the right attenuated P3 branch enhancement. 2. Normal vertebral and basilar arteries. No significant carotid atherosclerosis, there is bilateral carotid artery ectasia. 3. Distal bilateral ACA atherosclerosis and stenosis. MCA branches are normal for age. 4. Expected evolution of the right PCA infarct with cytotoxic edema in the right thalamus and occipital lobe but no hemorrhage or mass effect. 5. No new intracranial abnormality. 6. Ectatic aortic arch. Electronically Signed   By: Genevie Ann M.D.   On: 10/02/2016 14:14   Dg Chest 2 View  Result Date: 10/01/2016 CLINICAL DATA:  Fall.  Cough. EXAM: CHEST  2 VIEW COMPARISON:  07/25/2016. FINDINGS: Normal heart size. No pleural effusion or edema. The lungs are hyperinflated but clear. No airspace opacities identified. The bones appear diffusely osteopenic. IMPRESSION: 1. Lungs appear hyperinflated but clear. No posttraumatic abnormalities identified. Electronically Signed   By: Kerby Moors M.D.   On: 10/01/2016 11:44   Dg Pelvis 1-2 Views  Result Date: 10/01/2016 CLINICAL DATA:  Pain and swelling secondary to a fall today. EXAM: PELVIS - 1-2 VIEW COMPARISON:  05/08/2015 FINDINGS: There is no acute fracture. There is an old deformity of the left pubic body and left inferior and superior pubic rami with healed fractures. There is an old healed intertrochanteric fracture of the proximal left femur. Screw and intramedullary nail in place in the proximal left femur. IMPRESSION: No acute abnormalities. Electronically Signed   By: Lorriane Shire M.D.   On: 10/01/2016 11:54   Dg Forearm Left  Result Date: 10/01/2016 CLINICAL DATA:  Left arm bruising secondary to a fall today. EXAM: LEFT FOREARM - 2 VIEW COMPARISON:  Radiographs dated 06/19/2004  FINDINGS: There is no acute fracture or dislocation or joint effusion. Previous open reduction and internal fixation of olecranon fracture which is now completely healed. K-wire and cerclage wire in place. IMPRESSION: No acute abnormalities. Electronically Signed   By: Lorriane Shire M.D.   On: 10/01/2016 11:53   Dg Tibia/fibula Left  Result Date: 10/01/2016 CLINICAL DATA:  Leg pain secondary to a fall today. EXAM: LEFT TIBIA AND FIBULA - 2 VIEW COMPARISON:  None. FINDINGS: There is no evidence of fracture or other focal bone lesions. Prominent soft tissue swelling anterior to the patella. IMPRESSION: No acute bone abnormality. Soft tissue swelling anterior to the patella. Electronically Signed   By: Lorriane Shire M.D.   On: 10/01/2016 11:50   Ct Head Wo Contrast  Result Date: 10/01/2016 CLINICAL DATA:  Multiple falls and abrasions. Back pain. Initial encounter. EXAM: CT HEAD WITHOUT CONTRAST CT CERVICAL SPINE WITHOUT CONTRAST TECHNIQUE: Multidetector CT imaging of the head and cervical spine was performed following  the standard protocol without intravenous contrast. Multiplanar CT image reconstructions of the cervical spine were also generated. COMPARISON:  Head CT 07/24/2016 FINDINGS: CT HEAD FINDINGS Brain: No evidence of acute infarction, hemorrhage, hydrocephalus, or extra-axial collection. There is cerebral atrophy, prominent in the medial temporal lobes, a pattern that can be seen with Alzheimer's disease. Chronic small vessel ischemia with extensive cerebral white matter low-density. There is an isodense mass on the midline anterior cranial fossa floor best seen on reformats and measures 9 x 16 mm. There is also a calcified extra-axial mass along the left middle cranial fossa floor measuring 12 mm. No associated significant mass effect or brain edema. Vascular: Atherosclerotic calcification. Skull: Negative for fracture Sinuses/Orbits: No evidence of injury. Bilateral cataract resection. CT CERVICAL  SPINE FINDINGS Alignment: No traumatic malalignment. Skull base and vertebrae: Negative for fracture. Soft tissues and spinal canal: No prevertebral fluid or swelling. No visible canal hematoma. Disc levels: C4-5, C5-6, and C6-7 advanced disc degeneration. No evidence of cord impingement. Upper chest: No evidence of injury. IMPRESSION: 1. No evidence of intracranial or cervical spine injury. 2. Atrophy and extensive chronic small vessel ischemia. 3. Known small meningiomas in the anterior and left middle cranial fossae. No significant mass effect or brain edema. Electronically Signed   By: Monte Fantasia M.D.   On: 10/01/2016 10:59   Ct Angio Neck W Or Wo Contrast  Result Date: 10/02/2016 CLINICAL DATA:  83 year old female with recent syncope and falls. Acute right PCA infarct on brain MRI today. 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 EXAM: CT ANGIOGRAPHY HEAD AND NECK TECHNIQUE: Multidetector CT imaging of the head and neck was performed using the standard protocol during bolus administration of intravenous contrast. Multiplanar CT image reconstructions and MIPs were obtained to evaluate the vascular anatomy. Carotid stenosis measurements (when applicable) are obtained utilizing NASCET criteria, using the distal internal carotid diameter as the denominator. CONTRAST:  50 mL Isovue 370 COMPARISON:  Brain MRI 0213 hours today. CT head and cervical spine 10/01/2016 and earlier. FINDINGS: CT HEAD Brain: Cytotoxic edema in the right thalamus and occipital lobe as expected given the diffusion abnormality on MRI earlier today. No associated hemorrhage or mass effect. Elsewhere stable bilateral confluent white matter T2 and FLAIR hyperintensity. Stable ventricle size and configuration. No acute intracranial hemorrhage identified. Calvarium and skull base: Stable and negative. Paranasal sinuses: Visualized paranasal sinuses and mastoids are stable and well pneumatized. Orbits: Stable orbit  and scalp soft tissues. CTA NECK Skeleton: No acute osseous abnormality identified. Upper chest: Negative lung apices. No superior mediastinal lymphadenopathy. Other neck: Negative.  No lymphadenopathy. Aortic arch: Ectatic aortic arch with a mildly kinked appearance. No arch atherosclerosis. No great vessel origin stenosis. Right carotid system: Normal aside from ectasia and minimal calcified plaque at the anterior right ICA bulb. Left carotid system: Normal aside from ectasia. Vertebral arteries:No proximal subclavian artery plaque or stenosis. Normal vertebral artery origins. Fairly codominant vertebral arteries are patent to the skullbase without stenosis. CTA HEAD Posterior circulation: Normal distal vertebral arteries. Normal PICA origins and vertebrobasilar junction. Normal basilar artery. Normal SCA origins. The left PCA origin is patent. The left P1 segment is tortuous there is probably a diminutive left posterior communicating artery. There is a severe stenosis of the left PCA distal P1 are proximal to or proximal P2 segment with preserved distal enhancement (series 15 image 19). On the right side a fetal type right PCA is suggested, but there is evidence of superimposed right P1 segment  thrombosis just beyond its origin (series 13, image 113). The right P2 segment is patent, but there is a moderate to severe stenosis at the right P3 branching (series 15, image 19). There is preserved but attenuated distal right PCA enhancement. Anterior circulation: Both ICA siphons are patent and normal aside from minimal calcified plaque, greater on the left. Normal ophthalmic and posterior communicating artery origins. Normal carotid termini, MCA and ACA origins. Anterior communicating artery and proximal ACA branches are within normal limits. There is mild to moderate left greater than right distal ACA irregularity and stenosis, maximal in the pericallosal artery region (series 21, image 15). Left MCA M1 segment and  bifurcation are normal. Right MCA M1 segment and bifurcation are normal. There is minimal bilateral distal MCA branch irregularity. Venous sinuses: Patent. Anatomic variants: Possible fetal type right PCA origin, uncertain. Delayed phase: No abnormal enhancement identified. Review of the MIP images confirms the above findings IMPRESSION: 1. Positive for a right P1 segment occlusion, but reconstituted right PCA flow via the right posterior communicating artery. Superimposed severe bilateral PCA stenosis, and on the right attenuated P3 branch enhancement. 2. Normal vertebral and basilar arteries. No significant carotid atherosclerosis, there is bilateral carotid artery ectasia. 3. Distal bilateral ACA atherosclerosis and stenosis. MCA branches are normal for age. 4. Expected evolution of the right PCA infarct with cytotoxic edema in the right thalamus and occipital lobe but no hemorrhage or mass effect. 5. No new intracranial abnormality. 6. Ectatic aortic arch. Electronically Signed   By: Genevie Ann M.D.   On: 10/02/2016 14:14   Ct Cervical Spine Wo Contrast  Result Date: 10/01/2016 CLINICAL DATA:  Multiple falls and abrasions. Back pain. Initial encounter. EXAM: CT HEAD WITHOUT CONTRAST CT CERVICAL SPINE WITHOUT CONTRAST TECHNIQUE: Multidetector CT imaging of the head and cervical spine was performed following the standard protocol without intravenous contrast. Multiplanar CT image reconstructions of the cervical spine were also generated. COMPARISON:  Head CT 07/24/2016 FINDINGS: CT HEAD FINDINGS Brain: No evidence of acute infarction, hemorrhage, hydrocephalus, or extra-axial collection. There is cerebral atrophy, prominent in the medial temporal lobes, a pattern that can be seen with Alzheimer's disease. Chronic small vessel ischemia with extensive cerebral white matter low-density. There is an isodense mass on the midline anterior cranial fossa floor best seen on reformats and measures 9 x 16 mm. There is also  a calcified extra-axial mass along the left middle cranial fossa floor measuring 12 mm. No associated significant mass effect or brain edema. Vascular: Atherosclerotic calcification. Skull: Negative for fracture Sinuses/Orbits: No evidence of injury. Bilateral cataract resection. CT CERVICAL SPINE FINDINGS Alignment: No traumatic malalignment. Skull base and vertebrae: Negative for fracture. Soft tissues and spinal canal: No prevertebral fluid or swelling. No visible canal hematoma. Disc levels: C4-5, C5-6, and C6-7 advanced disc degeneration. No evidence of cord impingement. Upper chest: No evidence of injury. IMPRESSION: 1. No evidence of intracranial or cervical spine injury. 2. Atrophy and extensive chronic small vessel ischemia. 3. Known small meningiomas in the anterior and left middle cranial fossae. No significant mass effect or brain edema. Electronically Signed   By: Monte Fantasia M.D.   On: 10/01/2016 10:59   Ct Lumbar Spine Wo Contrast  Result Date: 10/01/2016 CLINICAL DATA:  Low back pain.  Multiple falls. Initial encounter. EXAM: CT LUMBAR SPINE WITHOUT CONTRAST TECHNIQUE: Multidetector CT imaging of the lumbar spine was performed without intravenous contrast administration. Multiplanar CT image reconstructions were also generated. COMPARISON:  05/17/2014 lumbar spine MRI FINDINGS: Segmentation:  Standard Alignment: Moderate levoscoliosis centered at L2. Vertebrae: Negative for acute fracture. Remote L1 superior endplate and L2 compression fractures. Height loss is moderate and retropulsion is mild at L2. Remote left twelfth rib and L1/L2 left transverse process fractures. Bilateral sacroiliac osteoarthritis with vacuum phenomenon. Paraspinal and other soft tissues: No acute finding. Partly seen bladder is moderately distended. Extensive sigmoid diverticulosis. Disc levels: T12- L1: No evidence of impingement L1-L2: Disc degeneration with narrowing and bulging. Negative facets. Mild bilateral  subarticular recess and spinal stenosis due to prior retropulsion. L2-L3: Disc degeneration with vacuum phenomenon throughout degenerative disc clefts. The disc is narrowed and bulging with inferior foraminal narrowing that is noncompressive. Patent spinal canal. L3-L4: Degenerative disc narrowing and circumferential bulging. Inferior foraminal narrowing without compression. L4-L5: Disc narrowing and bulging. Asymmetric left degenerative facet spurring and ligamentum flavum thickening. Mild left subarticular recess narrowing. Mild left foraminal narrowing. L5-S1:Chronic pars defect on the right. Mild disc narrowing and desiccation. Left foraminal disc bulge with L5 impingement. Patent spinal canal. IMPRESSION: 1. No acute finding. 2. Remote L1 and L2 compression fractures. 3. Scoliosis and spinal degeneration. Notable impingement at the left L5 S1 foramen due to disc bulge. 4. L5 chronic right pars defect. Electronically Signed   By: Monte Fantasia M.D.   On: 10/01/2016 11:08   Ct Knee Left Wo Contrast  Result Date: 10/01/2016 CLINICAL DATA:  Left knee pain after fall. EXAM: CT OF THE LEFT KNEE WITHOUT CONTRAST TECHNIQUE: Multidetector CT imaging of the left knee was performed according to the standard protocol. Multiplanar CT image reconstructions were also generated. COMPARISON:  Left knee x-rays from same day. FINDINGS: Bones/Joint/Cartilage No acute fracture or malalignment. Partially visualized distal femoral intramedullary rod. Mild tricompartmental degenerative changes of the knee. Chondrocalcinosis. No joint effusion. Ligaments Suboptimally assessed by CT. Muscles and Tendons No focal abnormality. Soft tissues Large hyperdense fluid collection over the anterior knee, more prominent on the medial aspect. The collection measures approximately 3.4 x 7.9 x 10.3 cm (AP by transverse by CC). There is prominent soft tissue swelling about the medial and lateral knee. IMPRESSION: 1. Large hyperdense fluid  collection over the anterior knee, more prominent on the medial aspect, consistent with hematoma. Findings likely represent hemorrhagic prepatellar bursitis. 2. No acute osseous abnormality of the knee. Mild tricompartmental degenerative changes. Electronically Signed   By: Titus Dubin M.D.   On: 10/01/2016 16:29   Mr Brain Wo Contrast  Addendum Date: 10/02/2016   ADDENDUM REPORT: 10/02/2016 03:08 ADDENDUM: Stable right paramedian frontal and left middle cranial fossa meningioma (series 10, image 17 and 22). Electronically Signed   By: Kristine Garbe M.D.   On: 10/02/2016 03:08   Result Date: 10/02/2016 CLINICAL DATA:  83 y/o  F; recurrent syncope. EXAM: MRI HEAD WITHOUT CONTRAST TECHNIQUE: Multiplanar, multiecho pulse sequences of the brain and surrounding structures were obtained without intravenous contrast. COMPARISON:  07/25/2016 MRI of the head.  10/01/2016 CT of the head. FINDINGS: Brain: Reduced diffusion within the right occipital lobe and the right lateral thalamus/ posterior limb of internal capsule compatible with acute/early subacute infarction. Additionally there is faint reduced diffusion in the right thalamus which may also represent infarct or secondary seizure activity. Areas of infarction are associated with mild T2 hyperintense signal abnormality. There is no mass effect or hemorrhage. Stable background of advanced chronic microvascular ischemic changes and parenchymal volume loss of the brain. No hydrocephalus, effacement of basilar cisterns, focal mass effect, or herniation. Vascular: Normal flow voids. Skull and upper cervical  spine: Heterogeneous bone marrow signal likely related to history of polycythemia vera and thrombocytosis. Sinuses/Orbits: Bilateral intra-ocular lens replacement. Mild mucosal thickening of ethmoid air cells. No abnormal signal of mastoid air cells. Other: None. IMPRESSION: 1. Acute/early subacute infarction involving the right occipital lobe and  separately the right lateral thalamus/posterior limb of internal capsule. Reduced diffusion in right hippocampus may represent infarction or secondary seizure activity. 2. No acute hemorrhage or significant mass effect. 3. Stable background of advanced chronic microvascular ischemic changes and parenchymal volume loss of the brain. These results will be called to the ordering clinician or representative by the Radiologist Assistant, and communication documented in the PACS or zVision Dashboard. Electronically Signed: By: Kristine Garbe M.D. On: 10/02/2016 02:59   Dg Knee Complete 4 Views Left  Result Date: 10/01/2016 CLINICAL DATA:  Knee pain secondary to a fall today. EXAM: LEFT KNEE - COMPLETE 4+ VIEW COMPARISON:  05/08/2015 FINDINGS: There is no fracture or dislocation. There is prominent soft tissue swelling anterior to the patella. Diffuse osteopenia. Chondrocalcinosis. Tiny marginal osteophytes. IMPRESSION: No acute bone abnormality.  Arthritic changes as described. Prominent soft tissue swelling anterior to the patella. Electronically Signed   By: Lorriane Shire M.D.   On: 10/01/2016 11:49   Dg Humerus Left  Result Date: 10/01/2016 CLINICAL DATA:  Left arm pain secondary to a fall today. EXAM: LEFT HUMERUS - 2+ VIEW COMPARISON:  None. FINDINGS: There is no evidence of fracture or other focal bone lesions. Soft tissues are unremarkable. IMPRESSION: Normal left humerus. Electronically Signed   By: Lorriane Shire M.D.   On: 10/01/2016 11:51   Dg Femur Min 2 Views Left  Result Date: 10/01/2016 CLINICAL DATA:  Left hip pain secondary to a fall today. EXAM: LEFT FEMUR 2 VIEWS COMPARISON:  Radiographs dated 05/08/2015 FINDINGS: There is no acute fracture. No dislocation. Old healed intertrochanteric fracture of the proximal left femur. Old pubic fractures. Intramedullary nail and screws in place in the femur. Marked anterior soft tissue swelling over the patella. IMPRESSION: No acute  abnormality of the left femur. Soft tissue swelling over the patella. Electronically Signed   By: Lorriane Shire M.D.   On: 10/01/2016 11:47        Scheduled Meds: . hydroxyurea  500 mg Oral QODAY  . irbesartan  75 mg Oral Daily  . levETIRAcetam  500 mg Oral BID  . levothyroxine  25 mcg Oral QAC breakfast  . metoprolol succinate  50 mg Oral Daily   Continuous Infusions:   LOS: 0 days    Time spent: 35 minutes.     Hosie Poisson, MD [facility] Pager [phone] If 7PM-7AM, please contact night-coverage [email or web address] Password [email or web address] 10/02/2016, 5:42 PM

## 2016-10-02 NOTE — Evaluation (Signed)
Clinical/Bedside Swallow Evaluation Patient Details  Name: [name] MRN: [id number] Date of Birth: 19-Oct-1926  Today's Date: 10/02/2016 Time: SLP Start Time (ACUTE ONLY): 9678 SLP Stop Time (ACUTE ONLY): 1505 SLP Time Calculation (min) (ACUTE ONLY): 20 min  Past Medical History:  Past Medical History:  Diagnosis Date  . Acute blood loss anemia   . Acute encephalopathy   . Altered mental state   . Arthritis   . Atrophic gastritis   . Breast cancer ([place])    right  . Candida esophagitis ([place])   . Chest tightness   . Chronic gastritis   . Closed hip fracture ([place])   . Closed intertrochanteric fracture of left femur with routine healing   . Diverticulosis   . Hypertension   . IBS (irritable bowel syndrome)   . Mitral valve prolapse   . MVP (mitral valve prolapse)   . Osteoporosis   . Palpitation   . Polycythemia   . PVC's (premature ventricular contractions)   . Rhinitis   . Seizures ([place])   . Unsteady gait    Past Surgical History:  Past Surgical History:  Procedure Laterality Date  . ABDOMINAL HYSTERECTOMY     ovaries taken  . APPENDECTOMY    . BREAST LUMPECTOMY     right  . CARDIOVASCULAR STRESS TEST  12/23/2000   EF 70%  . CATARACT EXTRACTION     bilateral  . ELBOW FRACTURE SURGERY     left  . FEMUR IM NAIL Left 05/08/2015   Procedure: INTERTROCHANTERIC FEMORAL NAIL;  Surgeon: Rod Can, MD;  Location: [facility];  Service: Orthopedics;  Laterality: Left;  . HEMATOMA EVACUATION     right  . TONSILLECTOMY    . TRANSTHORACIC ECHOCARDIOGRAM  03/05/2010   EF 55-60%   HPI:  Kelly Omara Kivettis a 83 y.o.femalewith medical history significant of CVA, hypertension, gastritis, esophageal stricture s/p dilation (2009, 2013), hiatal hernia, multiple falls in the past who presented with increased falls. She was found to have left knee swelling and tenderness and bruising. X rays did not show any fracture , but CT of the knee showed hematoma and pre patellar hemorrhage.  MRI 10/02/16 showed acute/early subacute infarction involving the right occipital lobe and separately the right lateral thalamus/posterior limb of internal capsule. BSE 07/27/16 recommended regular diet with thin liquids, GI consult to determine need for further esophageal w/u.    Assessment / Plan / Recommendation Clinical Impression  Patient presents with mild risk for aspiration given her prior history of esophageal dysphagia and new left-sided facial weakness, impaired sensation. Despite these deficits, her oropharyngeal swallow appears grossly within functional limits with adequate airway protection. No overt signs of aspiration despite challenging with consecutive straw sips of thin liquid in excess of 3 oz. Oral preparation, control and clearance adequate for solids; pt independently completes lingual sweep for oral clearance. She endorses mild foreign body sensation at sternal notch, consistent with her history of esophageal dysphagia. She may benefit from GI consult to determine need for further esophageal work-up based on her symptoms. Pt and her son describe recent decline in short term memory. She would benefit from cognitive-linguistic evaluation given findings of new CVA. No further needs identified for dysphagia; recommend regular diet with thin liquids, meds whole with liquid, esophageal precautions.  SLP Visit Diagnosis: Dysphagia, unspecified (R13.10)    Aspiration Risk  Mild aspiration risk    Diet Recommendation Regular;Thin liquid   Liquid Administration via: Cup;Straw Medication Administration: Whole meds with liquid Supervision: Patient  able to self feed Compensations: Slow rate;Small sips/bites Postural Changes: Seated upright at 90 degrees;Remain upright for at least 30 minutes after po intake    Other  Recommendations Recommended Consults: Consider GI evaluation;Other (Comment) (cognitive-linguistic evaluation) Oral Care Recommendations: Oral care BID   Follow up  Recommendations Other (comment) (TBD by cognitive-linguistic needs)      Frequency and Duration            Prognosis Prognosis for Safe Diet Advancement: Good      Swallow Study   General Date of Onset: 10/01/16 HPI: Kelly Laing Kivettis a 83 y.o.femalewith medical history significant of CVA, hypertension, gastritis, esophageal stricture s/p dilation (2009, 2013), hiatal hernia, multiple falls in the past who presented with increased falls. She was found to have left knee swelling and tenderness and bruising. X rays did not show any fracture , but CT of the knee showed hematoma and pre patellar hemorrhage. MRI 10/02/16 showed acute/early subacute infarction involving the right occipital lobe and separately the right lateral thalamus/posterior limb of internal capsule. BSE 07/27/16 recommended regular diet with thin liquids, GI consult to determine need for further esophageal w/u.  Type of Study: Bedside Swallow Evaluation Previous Swallow Assessment: see HPI Diet Prior to this Study: Regular;Thin liquids Temperature Spikes Noted: No Respiratory Status: Room air History of Recent Intubation: No Behavior/Cognition: Alert;Cooperative Oral Cavity Assessment: Within Functional Limits;Other (comment);Lesions (small left buccal lesion ) Oral Care Completed by SLP: No Oral Cavity - Dentition: Adequate natural dentition Vision: Functional for self-feeding (? left visual deficits) Self-Feeding Abilities: Able to feed self Patient Positioning: Upright in bed Baseline Vocal Quality: Normal Volitional Cough: Strong Volitional Swallow: Able to elicit    Oral/Motor/Sensory Function Overall Oral Motor/Sensory Function: Mild impairment Facial ROM: Reduced left;Suspected CN VII (facial) dysfunction Facial Symmetry: Abnormal symmetry left;Suspected CN VII (facial) dysfunction Facial Strength: Reduced left;Suspected CN VII (facial) dysfunction Facial Sensation: Reduced left;Suspected CN V (Trigeminal)  dysfunction Lingual ROM: Within Functional Limits Lingual Symmetry: Within Functional Limits Lingual Strength: Within Functional Limits Lingual Sensation: Within Functional Limits Velum: Within Functional Limits Mandible: Within Functional Limits   Ice Chips Ice chips: Within functional limits   Thin Liquid Thin Liquid: Within functional limits Presentation: Straw;Self Fed    Nectar Thick Nectar Thick Liquid: Not tested   Honey Thick Honey Thick Liquid: Not tested   Puree Puree: Within functional limits Presentation: Self Fed;Spoon   Solid   GO   Solid: Within functional limits Presentation: Self Fed    Functional Assessment Tool Used: skilled clinical judgment Functional Limitations: Swallowing Swallow Current Status (V3710): At least 1 percent but less than 20 percent impaired, limited or restricted Swallow Goal Status [id number]): At least 1 percent but less than 20 percent impaired, limited or restricted Swallow Discharge Status [id number]): At least 1 percent but less than 20 percent impaired, limited or restricted   Aliene Altes 10/02/2016,3:20 PM   Deneise Lever, [place], [place] Speech-Language Pathologist [phone]

## 2016-10-02 NOTE — Evaluation (Signed)
Physical Therapy Evaluation Patient Details Name: [name] MRN: [id number] DOB: [birth year] Today's Date: 10/02/2016   History of Present Illness  83 yo female with onset of mult recent falls was admitted after sustaining a hematoma on L knee and in subpatellar area.  PMHx:  meningiomas, L1 L 2 compression fractures, acute encephalopathy, breast CA, HTN, seizures, osteoporosis, hip fracture,   Clinical Impression  Pt is up to transfer to [facility] and noted her difficulty in using LLE and controlling standing, very passive about the transition.  Pt was tired from initial effort and could not get her to help stand on RW.  Will assume her ability to stand is compromised with her recent L knee trauma along with co-morbidities, and will need rehab to see her for both acute strengthening therapy and follow through for SNF training of gait.  Pt would like to return home to live independently again and this will provide her best chance of regaining her PLOF.    Follow Up Recommendations SNF    Equipment Recommendations  None recommended by PT    Recommendations for Other Services       Precautions / Restrictions Precautions Precautions: Fall Precaution Comments: Pt is having pain with reduced WB tolerance on LLE Restrictions Weight Bearing Restrictions: No      Mobility  Bed Mobility Overal bed mobility: Needs Assistance Bed Mobility: Supine to Sit;Sit to Supine     Supine to sit: Mod assist Sit to supine: Total assist   General bed mobility comments: pt doesn't assist with controlling transition to side for returning to bed  Transfers Overall transfer level: Needs assistance Equipment used: 1 person hand held assist Transfers: Sit to/from Stand;Stand Pivot Transfers Sit to Stand: Max assist Stand pivot transfers: Total assist       General transfer comment: pt cannot control her trunk well and just pushed top of her head into PT during pivot back to bed from  [facility]  Ambulation/Gait             General Gait Details: unable to walk  Stairs            Wheelchair Mobility    Modified Rankin (Stroke Patients Only)       Balance Overall balance assessment: History of Falls;Needs assistance Sitting-balance support: Feet supported;Bilateral upper extremity supported Sitting balance-Leahy Scale: Fair     Standing balance support: Bilateral upper extremity supported;During functional activity Standing balance-Leahy Scale: Zero                               Pertinent Vitals/Pain Pain Assessment: Faces Faces Pain Scale: Hurts even more Pain Location: L knee with flexion Pain Intervention(s): Limited activity within patient's tolerance;Monitored during session;Repositioned;Premedicated before session    [place] expects to be discharged to:: Private residence Living Arrangements: Alone Available Help at Discharge: Family;Available PRN/intermittently Type of Home: House Home Access: Stairs to enter   Entrance Stairs-Number of Steps: 2 Home Layout: Two level;Able to live on main level with bedroom/bathroom Home Equipment: Gilford Rile - 2 wheels;Cane - single point      Prior Function Level of Independence: Independent with assistive device(s)         Comments: has not driven since her seizure a few months ago     Hand Dominance   Dominant Hand: Right    Extremity/Trunk Assessment   Upper Extremity Assessment Upper Extremity Assessment: Generalized weakness    Lower Extremity  Assessment Lower Extremity Assessment: Generalized weakness    Cervical / Trunk Assessment Cervical / Trunk Assessment: Kyphotic  Communication   Communication: Other (comment) (lethargy and slow to respond to questions)  Cognition Arousal/Alertness: Lethargic Behavior During Therapy: Flat affect Overall Cognitive Status: No family/caregiver present to determine baseline cognitive functioning                                  General Comments: Pt is able to speak about PLOF but not clear on all the details of PLOF      General Comments      Exercises     Assessment/Plan    PT Assessment Patient needs continued PT services  PT Problem List Decreased strength;Decreased range of motion;Decreased activity tolerance;Decreased balance;Decreased mobility;Decreased coordination;Decreased cognition;Decreased knowledge of use of DME;Decreased safety awareness;Cardiopulmonary status limiting activity;Decreased skin integrity;Pain       PT Treatment Interventions DME instruction;Gait training;Functional mobility training;Therapeutic activities;Therapeutic exercise;Balance training;Neuromuscular re-education;Patient/family education    PT Goals (Current goals can be found in the Care Plan section)  Acute Rehab PT Goals Patient Stated Goal: none stated PT Goal Formulation: Patient unable to participate in goal setting Time For Goal Achievement: 10/16/16 Potential to Achieve Goals: Fair    Frequency Min 3X/week   Barriers to discharge Decreased caregiver support;Inaccessible home environment has no children living with her and has to be able to transfer herself    Co-evaluation               AM-PAC PT "6 Clicks" Daily Activity  Outcome Measure Difficulty turning over in bed (including adjusting bedclothes, sheets and blankets)?: Unable Difficulty moving from lying on back to sitting on the side of the bed? : Unable Difficulty sitting down on and standing up from a chair with arms (e.g., wheelchair, bedside commode, etc,.)?: Unable Help needed moving to and from a bed to chair (including a wheelchair)?: Total Help needed walking in hospital room?: Total Help needed climbing 3-5 steps with a railing? : Total 6 Click Score: 6    End of Session Equipment Utilized During Treatment: Gait belt Activity Tolerance: Patient limited by fatigue;Patient limited by lethargy;Patient  limited by pain Patient left: in bed;with call bell/phone within reach;with bed alarm set;with nursing/sitter in room Nurse Communication: Mobility status;Other (comment) (discussed pt confusion) PT Visit Diagnosis: Unsteadiness on feet (R26.81);History of falling (Z91.81);Muscle weakness (generalized) (M62.81);Difficulty in walking, not elsewhere classified (R26.2);Adult, failure to thrive (R62.7)    Time: 2992-4268 PT Time Calculation (min) (ACUTE ONLY): 29 min   Charges:   PT Evaluation $PT Eval Moderate Complexity: 1 Mod PT Treatments $Therapeutic Activity: 8-22 mins   PT G Codes:   PT G-Codes **NOT FOR INPATIENT CLASS** Functional Assessment Tool Used: AM-PAC 6 Clicks Basic Mobility    Ramond Dial 10/02/2016, 4:11 PM   Mee Hives, PT MS Acute Rehab Dept. Number: [place]

## 2016-10-03 DIAGNOSIS — T07XXXA Unspecified multiple injuries, initial encounter: Secondary | ICD-10-CM

## 2016-10-03 LAB — LIPID PANEL
CHOL/HDL RATIO: 3.1 ratio
CHOLESTEROL: 117 mg/dL (ref 0–200)
HDL: 38 mg/dL — ABNORMAL LOW (ref >40)
LDL Cholesterol: 65 mg/dL (ref 0–99)
TRIGLYCERIDES: 72 mg/dL (ref <150)
VLDL: 14 mg/dL (ref 0–40)

## 2016-10-03 NOTE — Plan of Care (Signed)
Problem: Skin Integrity: Goal: Risk for impaired skin integrity will decrease Patient's skin is clean dry and intact

## 2016-10-03 NOTE — Progress Notes (Signed)
STROKE TEAM PROGRESS NOTE   HISTORY OF PRESENT ILLNESS (per record) [name] is a 83 y.o. female with a history of previous stroke who presents with increased falls. She states that her left side is been weak "for about a year" but seems little worse recently. She has difficulty giving me an exact time of onset for these symptoms.  LKW: Unclear time of onset tpa given?: no, unclear time of onset, the likely more than a week   SUBJECTIVE (INTERVAL HISTORY) Her son was updated is at the bedside.    OBJECTIVE Temp:  [98.4 F (36.9 C)-98.6 F (37 C)] 98.6 F (37 C) (09/02 1453) Pulse Rate:  [73-80] 73 (09/02 1453) Cardiac Rhythm: Normal sinus rhythm (09/02 0700) Resp:  [16] 16 (09/02 1453) BP: (150-175)/(57-70) 150/57 (09/02 1453) SpO2:  [97 %-98 %] 98 % (09/02 1453)  Physical Exam  Constitutional: Appears well-developed and well-nourished.  Head: Normocephalic.  Cardiovascular: Normal rate and regular rhythm.  Respiratory: Effort normal and breath sounds normal to anterior ascultation GI: Soft.  No distension. There is no tenderness.  Skin: WDI  Neuro: Mental Status: Patient is awake, alert, interactive and appropriate Patient is able to give a clear and coherent history No signs of aphasia  Though she does not extinguish to double simultaneous stimulation, she does not appear to attend to the left side as well as the right. Cranial Nerves: II: Left hemianopia. Pupils are equal, round, and reactive to light.   III,IV, VI: She has a right gaze preference, though she does cross midline to the left slightly, does not have full versions in that direction  V: Facial sensation is symmetric to temperature VII: Facial movement is decreased on the left VIII: hearing is intact to voice X: Uvula elevates symmetrically XI: Shoulder shrug is symmetric. XII: tongue is midline without atrophy or fasciculations.  Motor: Tone is normal. Bulk is normal. She has good strength on the  right, 3/5 in both the left arm and leg Sensory: Sensation is symmetric to light touch and temperature in the arms and legs. She does not extinguish to double simultaneous stimulation Cerebellar: No ataxia on finger-nose-finger on the right    CBC:   Recent Labs Lab 10/01/16 1025 10/02/16 1143  WBC 15.1* 18.8*  NEUTROABS 13.4*  --   HGB 11.2* 10.8*  HCT 38.8 37.6  MCV 63.0* 62.5*  PLT 382 435*    Basic Metabolic Panel:   Recent Labs Lab 10/01/16 1025 10/02/16 1143  NA 139 135  K 4.2 4.3  CL 109 106  CO2 22 21*  GLUCOSE 114* 108*  BUN 14 14  CREATININE 1.06* 0.95  CALCIUM 8.3* 7.9*    Lipid Panel:     Component Value Date/Time   CHOL 117 10/03/2016 0412   TRIG 72 10/03/2016 0412   HDL 38 (L) 10/03/2016 0412   CHOLHDL 3.1 10/03/2016 0412   VLDL 14 10/03/2016 0412   LDLCALC 65 10/03/2016 0412   HgbA1c:  Lab Results  Component Value Date   HGBA1C 6.0 (H) 10/02/2016   Urine Drug Screen: No results found for: LABOPIA, COCAINSCRNUR, LABBENZ, AMPHETMU, THCU, LABBARB  Alcohol Level No results found for: ETH  IMAGING  Ct Angio Head W Or Wo Contrast Ct Angio Neck W Or Wo Contrast 10/02/2016 1. Positive for a right P1 segment occlusion, but reconstituted right PCA flow via the right posterior communicating artery.  Superimposed severe bilateral PCA stenosis, and on the right attenuated P3 branch enhancement.  2. Normal  vertebral and basilar arteries. No significant carotid atherosclerosis, there is bilateral carotid artery ectasia.  3. Distal bilateral ACA atherosclerosis and stenosis. MCA branches are normal for age.  4. Expected evolution of the right PCA infarct with cytotoxic edema in the right thalamus and occipital lobe  5. No new intracranial abnormality.  6. Ectatic aortic arch.    Ct Knee Left Wo Contrast 10/01/2016 1. Large hyperdense fluid collection over the anterior knee, more prominent on the medial aspect, consistent with hematoma. Findings  likely represent hemorrhagic prepatellar bursitis.  2. No acute osseous abnormality of the knee. Mild tricompartmental degenerative changes.    Mr Brain Wo Contrast 10/02/2016   1. Acute/early subacute infarction involving the right occipital lobe and separately the right lateral thalamus/posterior limb of internal capsule. Reduced diffusion in right hippocampus may represent infarction or secondary seizure activity.  2. No acute hemorrhage or significant mass effect.  3. Stable background of advanced chronic microvascular ischemic changes and parenchymal volume loss of the brain.   ADDENDUM REPORT: 10/02/2016 03:08:  Stable right paramedian frontal and left middle cranial fossa meningioma (series 10, image 17 and 22).     Transthoracic Echocardiogram  10/02/2016 Study Conclusions - Left ventricle: The cavity size was normal. Wall thickness was   normal. Systolic function was normal. The estimated ejection   fraction was in the range of 60% to 65%. Wall motion was normal;   there were no regional wall motion abnormalities. Doppler   parameters are consistent with abnormal left ventricular   relaxation (grade 1 diastolic dysfunction). The E/e&' ratio is   between 8-15, suggesting indeterminate LV filling pressure. - Aortic valve: Sclerosis without stenosis. There was mild   regurgitation. - Mitral valve: Mildly thickened leaflets . Systolic bowing without   prolapse. There was trivial regurgitation. - Left atrium: The atrium was normal in size. - Right atrium: Mildly dilated. - Tricuspid valve: There was trivial regurgitation. - Pulmonary arteries: PA peak pressure: 20 mm Hg (S). - Systemic veins: The IVC measures <2.1 cm, but does not collapse   >50%, suggesting an elevated RA pressure of 8 mmHg. Impressions: - Compared to a prior study in 07/2016, the LVEF is lower (but still   normal) at 60-65%, no clear regional wall motion abnormalities.   There is now grade 1 DD with  indeterminate LV filling pressure.      PHYSICAL EXAM Vitals:   10/02/16 1542 10/02/16 2031 10/03/16 0603 10/03/16 1453  BP: (!) 173/63 (!) 175/70 (!) 161/58 (!) 150/57  Pulse: 80 75 75 73  Resp: 16 16 16 16   Temp: 98.4 F (36.9 C) 98.4 F (36.9 C) 98.5 F (36.9 C) 98.6 F (37 C)  TempSrc: Oral Oral Oral Oral  SpO2: 98% 98% 97% 98%            ASSESSMENT/PLAN Ms. DISNEY RUGGIERO is a 83 y.o. female with history of unsteady gait, seizures, polycythemia, palpitations, mitral valve prolapse, hypertension and breast cancer, presenting with recent falls and left-sided weakness. She did not receive IV t-PA due to unknown time of onset.  Stroke:  acute/early subacute infarction involving the right occipital lobe - likely embolic - source unknown.  Resultant    CT head - expected evolution of the right PCA infarct with cytotoxic edema in the right thalamus and occipital lobe.  MRI head - acute/early subacute infarction involving the right occipital lobe.  MRA head - not performed  Carotid Doppler - CTA neck  CTA H&N - Positive for  a right P1 segment occlusion - severe bilateral PCA stenosis.  2D Echo -  EF  60% to 65%. No cardiac source of emboli identified.  LDL - 65  HgbA1c - 6.0  VTE prophylaxis - SCDs Diet regular Room service appropriate? Yes; Fluid consistency: Thin  aspirin 81 mg daily prior to admission, now on aspirin 325 mg daily  Patient counseled to be compliant with her antithrombotic medications  Ongoing aggressive stroke risk factor management  Therapy recommendations:  SNF recommended  Disposition:  Pending  Hypertension  Stable  Permissive hypertension (OK if < 220/120) but gradually normalize in 5-7 days  Long-term BP goal normotensive  Hyperlipidemia  Home meds: No lipid lowering medications prior to admission  LDL 65, goal < 70   Other Stroke Risk Factors  Advanced age  Former smoker - quit more than 30 years ago  ETOH use,  advised to drink no more than 1 drink per day  Obesity, There is no height or weight on file to calculate BMI., recommend weight loss, diet and exercise as appropriate    Other Active Problems  Seizure history - on Keppra - consider EEG ?  History of palpitations - R/O A. Fib  Anemia - 10.8 / 37.6  Leukocytosis - 18.8 (afebrile)  Recent falls  CT Scan Lt Knee - consistent with hematoma likely represents hemorrhagic prepatellar bursitis.   Hospital day # 1    To contact Stroke Continuity provider, please refer to [email or web address] After hours, contact General Neurology

## 2016-10-03 NOTE — Progress Notes (Signed)
PROGRESS NOTE    [name]  CVE:[id number] DOB: 09/07/1926 DOA: 10/01/2016 PCP: Deland Pretty, MD    Brief Narrative: [name] is a 83 y.o. female with medical history significant of hypertension, gastritis, multiple falls in the past , was brought in by her son for a fall one week ago and a fall today, multiple falls voer the 2 months. MRI brain shows Acute/early subacute infarction involving the right occipital lobe and separately the right lateral thalamus/posterior limb of internal capsule. Reduced diffusion in right hippocampus may represent infarction or secondary seizure activity.  Assessment & Plan:   Active Problems:   Fall   Cerebral thrombosis with cerebral infarction   Acute stroke of the right occipital lobe/ right lateral thalamus:  Stroke work up in progress,  CT angio of the head and neck Expected evolution of the right PCA infarct with cytotoxic edema in the right thalamus and occipital lobe but no hemorrhage or mass effect.  Echocardiogram Compared to a prior study in 07/2016, the LVEF is lower (but still   normal) at 60-65%, no clear regional wall motion abnormalities.   There is now grade 1 DD with indeterminate LV filling pressure. LDL is 65, HDL is 38.  hgba1c is 6.   Started on aspirin 325 mg daily.     Left knee hematoma:  Ace bandage and ice.  PT evaluation recommended SNF.    Hypertension:  Well controlled.   GERD:  On protonix.    Seizures:  Resume Keppra.    H/o polycythemia vera:  Resume hydroxyurea.    Hypothyroidism:  Resume synthroid.    DVT prophylaxis:scd's Code Status:DNR Family Communication: none at bedside, discussed with son at bedside.  Disposition Plan: SNF. Consultants:   Neurology.    Procedures: MRI brain   Echocardiogram.   CT head and neck.    Antimicrobials: none    Subjective: No new complaints. No chest pain or sob.  Discussed with pts daughter.   Objective: Vitals:   10/02/16  0700 10/02/16 1542 10/02/16 2031 10/03/16 0603  BP: (!) 148/56 (!) 173/63 (!) 175/70 (!) 161/58  Pulse:  80 75 75  Resp:  16 16 16   Temp:  98.4 F (36.9 C) 98.4 F (36.9 C) 98.5 F (36.9 C)  TempSrc:  Oral Oral Oral  SpO2:  98% 98% 97%    Intake/Output Summary (Last 24 hours) at 10/03/16 1200 Last data filed at 10/03/16 0820  Gross per 24 hour  Intake              360 ml  Output              800 ml  Net             -440 ml   There were no vitals filed for this visit.  Examination:  General exam: Appears calm and comfortable , not in distress.  Respiratory system: Clear to auscultation. Respiratory effort normal. No wheezing or rhonchi.  Cardiovascular system: S1 & S2 heard, RRR. No JVD, murmurs, rubs, gallops or clicks. No pedal edema. Gastrointestinal system: Abdomen is nondistended, soft and nontender. No organomegaly or masses felt. Normal bowel sounds heard. Central nervous system: Alert and oriented. No focal neurological deficits. Extremities: Symmetric 5 x 5 power. Left knee hematoma.  Skin: No rashes, lesions or ulcers Psychiatry:  Mood & affect appropriate.     Data Reviewed: I have personally reviewed following labs and imaging studies  CBC:  Recent Labs Lab 10/01/16 1025  10/02/16 1143  WBC 15.1* 18.8*  NEUTROABS 13.4*  --   HGB 11.2* 10.8*  HCT 38.8 37.6  MCV 63.0* 62.5*  PLT 382 341*   Basic Metabolic Panel:  Recent Labs Lab 10/01/16 1025 10/02/16 1143  NA 139 135  K 4.2 4.3  CL 109 106  CO2 22 21*  GLUCOSE 114* 108*  BUN 14 14  CREATININE 1.06* 0.95  CALCIUM 8.3* 7.9*   GFR: Estimated Creatinine Clearance: 26.9 mL/min (by C-G formula based on SCr of 0.95 mg/dL). Liver Function Tests:  Recent Labs Lab 10/01/16 1025  AST 20  ALT 10*  ALKPHOS 61  BILITOT 0.8  PROT 5.7*  ALBUMIN 3.3*   No results for input(s): LIPASE, AMYLASE in the last 168 hours. No results for input(s): AMMONIA in the last 168 hours. Coagulation  Profile:  Recent Labs Lab 10/01/16 1025  INR 1.20   Cardiac Enzymes: No results for input(s): CKTOTAL, CKMB, CKMBINDEX, TROPONINI in the last 168 hours. BNP (last 3 results) No results for input(s): PROBNP in the last 8760 hours. HbA1C:  Recent Labs  10/02/16 1143  HGBA1C 6.0*   CBG: No results for input(s): GLUCAP in the last 168 hours. Lipid Profile:  Recent Labs  10/03/16 0412  CHOL 117  HDL 38*  LDLCALC 65  TRIG 72  CHOLHDL 3.1   Thyroid Function Tests: No results for input(s): TSH, T4TOTAL, FREET4, T3FREE, THYROIDAB in the last 72 hours. Anemia Panel: No results for input(s): VITAMINB12, FOLATE, FERRITIN, TIBC, IRON, RETICCTPCT in the last 72 hours. Sepsis Labs: No results for input(s): PROCALCITON, LATICACIDVEN in the last 168 hours.  No results found for this or any previous visit (from the past 240 hour(s)).       Radiology Studies: Ct Angio Head W Or Wo Contrast  Result Date: 10/02/2016 CLINICAL DATA:  83 year old female with recent syncope and falls. Acute right PCA infarct on brain MRI today. 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 EXAM: CT ANGIOGRAPHY HEAD AND NECK TECHNIQUE: Multidetector CT imaging of the head and neck was performed using the standard protocol during bolus administration of intravenous contrast. Multiplanar CT image reconstructions and MIPs were obtained to evaluate the vascular anatomy. Carotid stenosis measurements (when applicable) are obtained utilizing NASCET criteria, using the distal internal carotid diameter as the denominator. CONTRAST:  50 mL Isovue 370 COMPARISON:  Brain MRI 0213 hours today. CT head and cervical spine 10/01/2016 and earlier. FINDINGS: CT HEAD Brain: Cytotoxic edema in the right thalamus and occipital lobe as expected given the diffusion abnormality on MRI earlier today. No associated hemorrhage or mass effect. Elsewhere stable bilateral confluent white matter T2 and FLAIR  hyperintensity. Stable ventricle size and configuration. No acute intracranial hemorrhage identified. Calvarium and skull base: Stable and negative. Paranasal sinuses: Visualized paranasal sinuses and mastoids are stable and well pneumatized. Orbits: Stable orbit and scalp soft tissues. CTA NECK Skeleton: No acute osseous abnormality identified. Upper chest: Negative lung apices. No superior mediastinal lymphadenopathy. Other neck: Negative.  No lymphadenopathy. Aortic arch: Ectatic aortic arch with a mildly kinked appearance. No arch atherosclerosis. No great vessel origin stenosis. Right carotid system: Normal aside from ectasia and minimal calcified plaque at the anterior right ICA bulb. Left carotid system: Normal aside from ectasia. Vertebral arteries:No proximal subclavian artery plaque or stenosis. Normal vertebral artery origins. Fairly codominant vertebral arteries are patent to the skullbase without stenosis. CTA HEAD Posterior circulation: Normal distal vertebral arteries. Normal PICA origins and vertebrobasilar junction. Normal basilar artery.  Normal SCA origins. The left PCA origin is patent. The left P1 segment is tortuous there is probably a diminutive left posterior communicating artery. There is a severe stenosis of the left PCA distal P1 are proximal to or proximal P2 segment with preserved distal enhancement (series 15 image 19). On the right side a fetal type right PCA is suggested, but there is evidence of superimposed right P1 segment thrombosis just beyond its origin (series 13, image 113). The right P2 segment is patent, but there is a moderate to severe stenosis at the right P3 branching (series 15, image 19). There is preserved but attenuated distal right PCA enhancement. Anterior circulation: Both ICA siphons are patent and normal aside from minimal calcified plaque, greater on the left. Normal ophthalmic and posterior communicating artery origins. Normal carotid termini, MCA and ACA  origins. Anterior communicating artery and proximal ACA branches are within normal limits. There is mild to moderate left greater than right distal ACA irregularity and stenosis, maximal in the pericallosal artery region (series 21, image 15). Left MCA M1 segment and bifurcation are normal. Right MCA M1 segment and bifurcation are normal. There is minimal bilateral distal MCA branch irregularity. Venous sinuses: Patent. Anatomic variants: Possible fetal type right PCA origin, uncertain. Delayed phase: No abnormal enhancement identified. Review of the MIP images confirms the above findings IMPRESSION: 1. Positive for a right P1 segment occlusion, but reconstituted right PCA flow via the right posterior communicating artery. Superimposed severe bilateral PCA stenosis, and on the right attenuated P3 branch enhancement. 2. Normal vertebral and basilar arteries. No significant carotid atherosclerosis, there is bilateral carotid artery ectasia. 3. Distal bilateral ACA atherosclerosis and stenosis. MCA branches are normal for age. 4. Expected evolution of the right PCA infarct with cytotoxic edema in the right thalamus and occipital lobe but no hemorrhage or mass effect. 5. No new intracranial abnormality. 6. Ectatic aortic arch. Electronically Signed   By: Genevie Ann M.D.   On: 10/02/2016 14:14   Ct Angio Neck W Or Wo Contrast  Result Date: 10/02/2016 CLINICAL DATA:  83 year old female with recent syncope and falls. Acute right PCA infarct on brain MRI today. 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 EXAM: CT ANGIOGRAPHY HEAD AND NECK TECHNIQUE: Multidetector CT imaging of the head and neck was performed using the standard protocol during bolus administration of intravenous contrast. Multiplanar CT image reconstructions and MIPs were obtained to evaluate the vascular anatomy. Carotid stenosis measurements (when applicable) are obtained utilizing NASCET criteria, using the distal internal carotid  diameter as the denominator. CONTRAST:  50 mL Isovue 370 COMPARISON:  Brain MRI 0213 hours today. CT head and cervical spine 10/01/2016 and earlier. FINDINGS: CT HEAD Brain: Cytotoxic edema in the right thalamus and occipital lobe as expected given the diffusion abnormality on MRI earlier today. No associated hemorrhage or mass effect. Elsewhere stable bilateral confluent white matter T2 and FLAIR hyperintensity. Stable ventricle size and configuration. No acute intracranial hemorrhage identified. Calvarium and skull base: Stable and negative. Paranasal sinuses: Visualized paranasal sinuses and mastoids are stable and well pneumatized. Orbits: Stable orbit and scalp soft tissues. CTA NECK Skeleton: No acute osseous abnormality identified. Upper chest: Negative lung apices. No superior mediastinal lymphadenopathy. Other neck: Negative.  No lymphadenopathy. Aortic arch: Ectatic aortic arch with a mildly kinked appearance. No arch atherosclerosis. No great vessel origin stenosis. Right carotid system: Normal aside from ectasia and minimal calcified plaque at the anterior right ICA bulb. Left carotid system: Normal aside from ectasia.  Vertebral arteries:No proximal subclavian artery plaque or stenosis. Normal vertebral artery origins. Fairly codominant vertebral arteries are patent to the skullbase without stenosis. CTA HEAD Posterior circulation: Normal distal vertebral arteries. Normal PICA origins and vertebrobasilar junction. Normal basilar artery. Normal SCA origins. The left PCA origin is patent. The left P1 segment is tortuous there is probably a diminutive left posterior communicating artery. There is a severe stenosis of the left PCA distal P1 are proximal to or proximal P2 segment with preserved distal enhancement (series 15 image 19). On the right side a fetal type right PCA is suggested, but there is evidence of superimposed right P1 segment thrombosis just beyond its origin (series 13, image 113). The right  P2 segment is patent, but there is a moderate to severe stenosis at the right P3 branching (series 15, image 19). There is preserved but attenuated distal right PCA enhancement. Anterior circulation: Both ICA siphons are patent and normal aside from minimal calcified plaque, greater on the left. Normal ophthalmic and posterior communicating artery origins. Normal carotid termini, MCA and ACA origins. Anterior communicating artery and proximal ACA branches are within normal limits. There is mild to moderate left greater than right distal ACA irregularity and stenosis, maximal in the pericallosal artery region (series 21, image 15). Left MCA M1 segment and bifurcation are normal. Right MCA M1 segment and bifurcation are normal. There is minimal bilateral distal MCA branch irregularity. Venous sinuses: Patent. Anatomic variants: Possible fetal type right PCA origin, uncertain. Delayed phase: No abnormal enhancement identified. Review of the MIP images confirms the above findings IMPRESSION: 1. Positive for a right P1 segment occlusion, but reconstituted right PCA flow via the right posterior communicating artery. Superimposed severe bilateral PCA stenosis, and on the right attenuated P3 branch enhancement. 2. Normal vertebral and basilar arteries. No significant carotid atherosclerosis, there is bilateral carotid artery ectasia. 3. Distal bilateral ACA atherosclerosis and stenosis. MCA branches are normal for age. 4. Expected evolution of the right PCA infarct with cytotoxic edema in the right thalamus and occipital lobe but no hemorrhage or mass effect. 5. No new intracranial abnormality. 6. Ectatic aortic arch. Electronically Signed   By: Genevie Ann M.D.   On: 10/02/2016 14:14   Ct Knee Left Wo Contrast  Result Date: 10/01/2016 CLINICAL DATA:  Left knee pain after fall. EXAM: CT OF THE LEFT KNEE WITHOUT CONTRAST TECHNIQUE: Multidetector CT imaging of the left knee was performed according to the standard protocol.  Multiplanar CT image reconstructions were also generated. COMPARISON:  Left knee x-rays from same day. FINDINGS: Bones/Joint/Cartilage No acute fracture or malalignment. Partially visualized distal femoral intramedullary rod. Mild tricompartmental degenerative changes of the knee. Chondrocalcinosis. No joint effusion. Ligaments Suboptimally assessed by CT. Muscles and Tendons No focal abnormality. Soft tissues Large hyperdense fluid collection over the anterior knee, more prominent on the medial aspect. The collection measures approximately 3.4 x 7.9 x 10.3 cm (AP by transverse by CC). There is prominent soft tissue swelling about the medial and lateral knee. IMPRESSION: 1. Large hyperdense fluid collection over the anterior knee, more prominent on the medial aspect, consistent with hematoma. Findings likely represent hemorrhagic prepatellar bursitis. 2. No acute osseous abnormality of the knee. Mild tricompartmental degenerative changes. Electronically Signed   By: Titus Dubin M.D.   On: 10/01/2016 16:29   Mr Brain Wo Contrast  Addendum Date: 10/02/2016   ADDENDUM REPORT: 10/02/2016 03:08 ADDENDUM: Stable right paramedian frontal and left middle cranial fossa meningioma (series 10, image 17 and 22). Electronically  Signed   By: Kristine Garbe M.D.   On: 10/02/2016 03:08   Result Date: 10/02/2016 CLINICAL DATA:  83 y/o  F; recurrent syncope. EXAM: MRI HEAD WITHOUT CONTRAST TECHNIQUE: Multiplanar, multiecho pulse sequences of the brain and surrounding structures were obtained without intravenous contrast. COMPARISON:  07/25/2016 MRI of the head.  10/01/2016 CT of the head. FINDINGS: Brain: Reduced diffusion within the right occipital lobe and the right lateral thalamus/ posterior limb of internal capsule compatible with acute/early subacute infarction. Additionally there is faint reduced diffusion in the right thalamus which may also represent infarct or secondary seizure activity. Areas of  infarction are associated with mild T2 hyperintense signal abnormality. There is no mass effect or hemorrhage. Stable background of advanced chronic microvascular ischemic changes and parenchymal volume loss of the brain. No hydrocephalus, effacement of basilar cisterns, focal mass effect, or herniation. Vascular: Normal flow voids. Skull and upper cervical spine: Heterogeneous bone marrow signal likely related to history of polycythemia vera and thrombocytosis. Sinuses/Orbits: Bilateral intra-ocular lens replacement. Mild mucosal thickening of ethmoid air cells. No abnormal signal of mastoid air cells. Other: None. IMPRESSION: 1. Acute/early subacute infarction involving the right occipital lobe and separately the right lateral thalamus/posterior limb of internal capsule. Reduced diffusion in right hippocampus may represent infarction or secondary seizure activity. 2. No acute hemorrhage or significant mass effect. 3. Stable background of advanced chronic microvascular ischemic changes and parenchymal volume loss of the brain. These results will be called to the ordering clinician or representative by the Radiologist Assistant, and communication documented in the PACS or zVision Dashboard. Electronically Signed: By: Kristine Garbe M.D. On: 10/02/2016 02:59        Scheduled Meds: . aspirin  325 mg Oral Daily  . hydroxyurea  500 mg Oral QODAY  . irbesartan  75 mg Oral QHS  . levETIRAcetam  500 mg Oral BID  . levothyroxine  25 mcg Oral QAC breakfast  . metoprolol succinate  50 mg Oral Daily   Continuous Infusions:   LOS: 1 day    Time spent: 35 minutes.     Hosie Poisson, MD [facility] Pager [phone] If 7PM-7AM, please contact night-coverage [email or web address] Password [email or web address] 10/03/2016, 12:00 PM

## 2016-10-04 DIAGNOSIS — D649 Anemia, unspecified: Secondary | ICD-10-CM

## 2016-10-04 LAB — CBC
HEMATOCRIT: 33.2 % — AB (ref 36.0–46.0)
Hemoglobin: 9.5 g/dL — ABNORMAL LOW (ref 12.0–15.0)
MCH: 17.9 pg — AB (ref 26.0–34.0)
MCHC: 28.6 g/dL — ABNORMAL LOW (ref 30.0–36.0)
MCV: 62.4 fL — AB (ref 78.0–100.0)
PLATELETS: 528 10*3/uL — AB (ref 150–400)
RBC: 5.32 MIL/uL — AB (ref 3.87–5.11)
RDW: 20.4 % — ABNORMAL HIGH (ref 11.5–15.5)
WBC: 14.3 10*3/uL — ABNORMAL HIGH (ref 4.0–10.5)

## 2016-10-04 MED ORDER — CLOPIDOGREL BISULFATE 75 MG PO TABS
75.0000 mg | ORAL_TABLET | Freq: Every day | ORAL | Status: DC
  Administered 2016-10-04 – 2016-10-06 (×3): 75 mg via ORAL
  Filled 2016-10-04 (×3): qty 1

## 2016-10-04 NOTE — Plan of Care (Signed)
Problem: Skin Integrity: Goal: Risk for impaired skin integrity will decrease Outcome: Progressing Patient's skin is clean dry and intact. Prepatellar hemorrage and hematoma to the left knee.

## 2016-10-04 NOTE — Progress Notes (Signed)
Re: order for carotid duplex. CTA neck performed 10/02/16. Please advise if carotid still needed.   Vascular lab [facility], [place], [place]

## 2016-10-04 NOTE — Progress Notes (Signed)
PROGRESS NOTE    [name]  RWE:[id number] DOB: 17-Oct-1926 DOA: 10/01/2016 PCP: Deland Pretty, MD    Brief Narrative: [name] is a 83 y.o. female with medical history significant of hypertension, gastritis, multiple falls in the past , was brought in by her son for a fall one week ago and a fall today, multiple falls voer the 2 months. MRI brain shows Acute/early subacute infarction involving the right occipital lobe and separately the right lateral thalamus/posterior limb of internal capsule. Reduced diffusion in right hippocampus may represent infarction or secondary seizure activity.  Assessment & Plan:   Active Problems:   Fall   Cerebral thrombosis with cerebral infarction   Acute stroke of the right occipital lobe/ right lateral thalamus sec to cerebral atherosclerosis:  Stroke work up done.  CT angio of the head and neck Expected evolution of the right PCA infarct with cytotoxic edema in the right thalamus and occipital lobe but no hemorrhage or mass effect.  Echocardiogram Compared to a prior study in 07/2016, the LVEF is lower (but still   normal) at 60-65%, no clear regional wall motion abnormalities.   There is now grade 1 DD with indeterminate LV filling pressure. LDL is 65, HDL is 38.  hgba1c is 6.   Started on aspirin 325 mg daily. Added plavix by neurologist for dual anti platelet therapy for 3 months followed by plavix alone. Follow up with neurologist as outpatient in 6 weeks.     Left knee hematoma:  Ace bandage and ice.  PT evaluation recommended SNF.  Mobility as tolerated.  Pain control.    Hypertension:  Well controlled.   GERD:  On protonix.    Seizures:  Resume Keppra.    H/o polycythemia vera:  Resume hydroxyurea. Will let her oncologist know about the admission.    Hypothyroidism:  Resume synthroid.   Normocytic anemia; hemoglobin stable.      DVT prophylaxis:scd's Code Status:DNR Family Communication: daughter at  bedside.  Disposition Plan: SNF. Consultants:   Neurology.    Procedures: MRI brain   Echocardiogram.   CT head and neck.    Antimicrobials: none    Subjective: No new complaints. No chest pain or sob.  Discussed with pts daughter.   Objective: Vitals:   10/03/16 1453 10/03/16 2207 10/04/16 0500 10/04/16 1415  BP: (!) 150/57 (!) 195/77 (!) 184/66 (!) 158/64  Pulse: 73 74 60 91  Resp: 16 16 16 16   Temp: 98.6 F (37 C) 97.9 F (36.6 C) 98.2 F (36.8 C) 98.2 F (36.8 C)  TempSrc: Oral Oral Axillary Oral  SpO2: 98% 97% 98% 96%    Intake/Output Summary (Last 24 hours) at 10/04/16 1610 Last data filed at 10/04/16 1415  Gross per 24 hour  Intake              360 ml  Output             1000 ml  Net             -640 ml   There were no vitals filed for this visit.  Examination:  General exam: Appears calm and comfortable , not in distress.  Respiratory system: Clear to auscultation. Respiratory effort normal. No wheezing or rhonchi.  Cardiovascular system: S1 & S2 heard, RRR. No JVD, murmurs, rubs, gallops or clicks. No pedal edema. Gastrointestinal system: Abdomen is nondistended, soft and nontender. No organomegaly or masses felt. Normal bowel sounds heard. Central nervous system: Alert and oriented. No  focal neurological deficits. Extremities: Symmetric 5 x 5 power. Left knee hematoma.  Skin: No rashes, lesions or ulcers Psychiatry:  Mood & affect appropriate.     Data Reviewed: I have personally reviewed following labs and imaging studies  CBC:  Recent Labs Lab 10/01/16 1025 10/02/16 1143 10/04/16 0236  WBC 15.1* 18.8* 14.3*  NEUTROABS 13.4*  --   --   HGB 11.2* 10.8* 9.5*  HCT 38.8 37.6 33.2*  MCV 63.0* 62.5* 62.4*  PLT 382 435* 588*   Basic Metabolic Panel:  Recent Labs Lab 10/01/16 1025 10/02/16 1143  NA 139 135  K 4.2 4.3  CL 109 106  CO2 22 21*  GLUCOSE 114* 108*  BUN 14 14  CREATININE 1.06* 0.95  CALCIUM 8.3* 7.9*    GFR: Estimated Creatinine Clearance: 26.9 mL/min (by C-G formula based on SCr of 0.95 mg/dL). Liver Function Tests:  Recent Labs Lab 10/01/16 1025  AST 20  ALT 10*  ALKPHOS 61  BILITOT 0.8  PROT 5.7*  ALBUMIN 3.3*   No results for input(s): LIPASE, AMYLASE in the last 168 hours. No results for input(s): AMMONIA in the last 168 hours. Coagulation Profile:  Recent Labs Lab 10/01/16 1025  INR 1.20   Cardiac Enzymes: No results for input(s): CKTOTAL, CKMB, CKMBINDEX, TROPONINI in the last 168 hours. BNP (last 3 results) No results for input(s): PROBNP in the last 8760 hours. HbA1C:  Recent Labs  10/02/16 1143  HGBA1C 6.0*   CBG: No results for input(s): GLUCAP in the last 168 hours. Lipid Profile:  Recent Labs  10/03/16 0412  CHOL 117  HDL 38*  LDLCALC 65  TRIG 72  CHOLHDL 3.1   Thyroid Function Tests: No results for input(s): TSH, T4TOTAL, FREET4, T3FREE, THYROIDAB in the last 72 hours. Anemia Panel: No results for input(s): VITAMINB12, FOLATE, FERRITIN, TIBC, IRON, RETICCTPCT in the last 72 hours. Sepsis Labs: No results for input(s): PROCALCITON, LATICACIDVEN in the last 168 hours.  No results found for this or any previous visit (from the past 240 hour(s)).       Radiology Studies: No results found.      Scheduled Meds: . aspirin  325 mg Oral Daily  . clopidogrel  75 mg Oral Daily  . hydroxyurea  500 mg Oral QODAY  . irbesartan  75 mg Oral QHS  . levETIRAcetam  500 mg Oral BID  . levothyroxine  25 mcg Oral QAC breakfast  . metoprolol succinate  50 mg Oral Daily   Continuous Infusions:   LOS: 2 days    Time spent: 35 minutes.     Hosie Poisson, MD [facility] Pager [phone] If 7PM-7AM, please contact night-coverage [email or web address] Password [email or web address] 10/04/2016, 4:10 PM

## 2016-10-04 NOTE — NC FL2 (Signed)
[place] LEVEL OF CARE SCREENING TOOL     IDENTIFICATION  Patient Name: [name] Birthdate: 1926/04/29 Sex: female Admission Date (Current Location): 10/01/2016  [facility] and [place] Number:  Herbalist and Address:  The [place], [place] [address], [place], [place] [address]      Provider Number: [id number]  Attending Physician Name and Address:  Hosie Poisson, MD  Relative Name and Phone Number:       Current Level of Care: Hospital Recommended Level of Care: [place] Prior Approval Number:    Date Approved/Denied:   PASRR Number: [id number]  Discharge Plan: SNF    Current Diagnoses: Patient Active Problem List   Diagnosis Date Noted  . Cerebral thrombosis with cerebral infarction 10/02/2016  . Fall 10/01/2016  . Memory loss 09/03/2016  . Fever 07/27/2016  . Acute encephalopathy 07/26/2016  . Protein-calorie malnutrition, severe 07/26/2016  . Altered mental status   . Seizure ([place])   . Hypertensive urgency 07/25/2016  . Foot swelling 04/28/2016  . History of breast cancer in female 08/13/2015  . Osteoporosis 07/01/2015  . Dehydration 06/11/2015  . Acute blood loss anemia   . Thrombocytosis ([place])   . Faintness   . Closed left hip fracture ([place]) 05/08/2015  . Leukocytosis 05/08/2015  . Fracture, intertrochanteric, left femur ([place]) 05/08/2015  . HTN (hypertension) 08/27/2010  . Mitral valve prolapse 08/27/2010  . Malignant neoplasm of female breast ([place]) 06/20/2007  . Polycythemia vera ([place]) 06/20/2007  . ABDOMINAL PAIN, EPIGASTRIC 06/20/2007  . Atrophic gastritis 06/19/2007  . DIVERTICULOSIS OF COLON 06/19/2007  . ARTHRITIS 06/19/2007    Orientation RESPIRATION BLADDER Height & Weight     Self, Time, Situation, Place  Normal Incontinent, External catheter Weight:   Height:     BEHAVIORAL SYMPTOMS/MOOD NEUROLOGICAL BOWEL NUTRITION STATUS      Continent Diet (see DC summary)   AMBULATORY STATUS COMMUNICATION OF NEEDS Skin   Extensive Assist Verbally Normal                       Personal Care Assistance Level of Assistance  Bathing, Dressing Bathing Assistance: Maximum assistance   Dressing Assistance: Maximum assistance     Functional Limitations Info             SPECIAL CARE FACTORS FREQUENCY  PT (By licensed PT), OT (By licensed OT)     PT Frequency: 5/wk OT Frequency: 5/wk            Contractures      Additional Factors Info  Code Status, Allergies Code Status Info: DNR Allergies Info: Sulfa Antibiotics, Sulfur           Current Medications (10/04/2016):  This is the current hospital active medication list Current Facility-Administered Medications  Medication Dose Route Frequency Provider Last Rate Last Dose  . aspirin tablet 325 mg  325 mg Oral Daily Hosie Poisson, MD   325 mg at 10/03/16 1025  . hydroxyurea (HYDREA) capsule 500 mg  500 mg Oral Susie Cassette, MD   500 mg at 10/02/16 0918  . irbesartan (AVAPRO) tablet 75 mg  75 mg Oral QHS Hosie Poisson, MD   75 mg at 10/03/16 2300  . levETIRAcetam (KEPPRA) tablet 500 mg  500 mg Oral BID Hosie Poisson, MD   500 mg at 10/03/16 2300  . levothyroxine (SYNTHROID, LEVOTHROID) tablet 25 mcg  25 mcg Oral QAC breakfast Hosie Poisson, MD   25 mcg  at 10/03/16 0656  . metoprolol succinate (TOPROL-XL) 24 hr tablet 50 mg  50 mg Oral Daily Hosie Poisson, MD   50 mg at 10/03/16 1025  . morphine 4 MG/ML injection 1-2 mg  1-2 mg Intravenous Q4H PRN Hosie Poisson, MD   2 mg at 10/02/16 0552  . pantoprazole (PROTONIX) EC tablet 40 mg  40 mg Oral PRN Hosie Poisson, MD         Discharge Medications: Please see discharge summary for a list of discharge medications.  Relevant Imaging Results:  Relevant Lab Results:   Additional Information SSN: [id number]  Jorge Ny, LCSW

## 2016-10-04 NOTE — Progress Notes (Signed)
STROKE TEAM PROGRESS NOTE   HISTORY OF PRESENT ILLNESS (per record) [name] is a 83 y.o. female with a history of previous stroke who presents with increased falls. She states that her left side is been weak "for about a year" but seems little worse recently. She has difficulty giving me an exact time of onset for these symptoms.  LKW: Unclear time of onset tpa given?: no, unclear time of onset, the likely more than a week   SUBJECTIVE (INTERVAL HISTORY) Her nephew  was updated  at the bedside. She continues to have significant left-sided peripheral vision loss.   OBJECTIVE Temp:  [97.9 F (36.6 C)-98.2 F (36.8 C)] 98.2 F (36.8 C) (09/03 1415) Pulse Rate:  [60-91] 91 (09/03 1415) Cardiac Rhythm: Normal sinus rhythm (09/03 0700) Resp:  [16] 16 (09/03 1415) BP: (158-195)/(64-77) 158/64 (09/03 1415) SpO2:  [96 %-98 %] 96 % (09/03 1415)  Physical Exam  Constitutional: Appears well-developed and well-nourished.  Head: Normocephalic.  Cardiovascular: Normal rate and regular rhythm.  Respiratory: Effort normal and breath sounds normal to anterior ascultation GI: Soft.  No distension. There is no tenderness.  Skin: WDI  Neuro: Mental Status: Patient is awake, alert, interactive and appropriate Patient is able to give a clear and coherent history No signs of aphasia  Though she does not extinguish to double simultaneous stimulation, she does not appear to attend not as well to the left side as well as the right. Cranial Nerves: II: Left dense homonymous hemianopia. Pupils are equal, round, and reactive to light.   III,IV, VI: She has a right gaze preference, though she does cross midline to the left slightly, does not have full versions in that direction  V: Facial sensation is symmetric to temperature VII: Facial movement is decreased on the left VIII: hearing is intact to voice X: Uvula elevates symmetrically XI: Shoulder shrug is symmetric. XII: tongue is midline  without atrophy or fasciculations.  Motor: Tone is normal. Bulk is normal. She has good strength on the right, 3/5 in both the left arm and leg Sensory: Sensation is symmetric to light touch and temperature in the arms and legs. She does not extinguish to double simultaneous stimulation Cerebellar: No ataxia on finger-nose-finger on the right    CBC:   Recent Labs Lab 10/01/16 1025 10/02/16 1143 10/04/16 0236  WBC 15.1* 18.8* 14.3*  NEUTROABS 13.4*  --   --   HGB 11.2* 10.8* 9.5*  HCT 38.8 37.6 33.2*  MCV 63.0* 62.5* 62.4*  PLT 382 435* 528*    Basic Metabolic Panel:   Recent Labs Lab 10/01/16 1025 10/02/16 1143  NA 139 135  K 4.2 4.3  CL 109 106  CO2 22 21*  GLUCOSE 114* 108*  BUN 14 14  CREATININE 1.06* 0.95  CALCIUM 8.3* 7.9*    Lipid Panel:     Component Value Date/Time   CHOL 117 10/03/2016 0412   TRIG 72 10/03/2016 0412   HDL 38 (L) 10/03/2016 0412   CHOLHDL 3.1 10/03/2016 0412   VLDL 14 10/03/2016 0412   LDLCALC 65 10/03/2016 0412   HgbA1c:  Lab Results  Component Value Date   HGBA1C 6.0 (H) 10/02/2016   Urine Drug Screen: No results found for: LABOPIA, COCAINSCRNUR, LABBENZ, AMPHETMU, THCU, LABBARB  Alcohol Level No results found for: ETH  IMAGING  Ct Angio Head W Or Wo Contrast Ct Angio Neck W Or Wo Contrast 10/02/2016 1. Positive for a right P1 segment occlusion, but reconstituted right PCA  flow via the right posterior communicating artery.  Superimposed severe bilateral PCA stenosis, and on the right attenuated P3 branch enhancement.  2. Normal vertebral and basilar arteries. No significant carotid atherosclerosis, there is bilateral carotid artery ectasia.  3. Distal bilateral ACA atherosclerosis and stenosis. MCA branches are normal for age.  4. Expected evolution of the right PCA infarct with cytotoxic edema in the right thalamus and occipital lobe  5. No new intracranial abnormality.  6. Ectatic aortic arch.    Ct Knee Left Wo  Contrast 10/01/2016 1. Large hyperdense fluid collection over the anterior knee, more prominent on the medial aspect, consistent with hematoma. Findings likely represent hemorrhagic prepatellar bursitis.  2. No acute osseous abnormality of the knee. Mild tricompartmental degenerative changes.    Mr Brain Wo Contrast 10/02/2016   1. Acute/early subacute infarction involving the right occipital lobe and separately the right lateral thalamus/posterior limb of internal capsule. Reduced diffusion in right hippocampus may represent infarction or secondary seizure activity.  2. No acute hemorrhage or significant mass effect.  3. Stable background of advanced chronic microvascular ischemic changes and parenchymal volume loss of the brain.   ADDENDUM REPORT: 10/02/2016 03:08:  Stable right paramedian frontal and left middle cranial fossa meningioma (series 10, image 17 and 22).     Transthoracic Echocardiogram  10/02/2016 Study Conclusions - Left ventricle: The cavity size was normal. Wall thickness was   normal. Systolic function was normal. The estimated ejection   fraction was in the range of 60% to 65%. Wall motion was normal;   there were no regional wall motion abnormalities. Doppler   parameters are consistent with abnormal left ventricular   relaxation (grade 1 diastolic dysfunction). The E/e&' ratio is   between 8-15, suggesting indeterminate LV filling pressure. - Aortic valve: Sclerosis without stenosis. There was mild   regurgitation. - Mitral valve: Mildly thickened leaflets . Systolic bowing without   prolapse. There was trivial regurgitation. - Left atrium: The atrium was normal in size. - Right atrium: Mildly dilated. - Tricuspid valve: There was trivial regurgitation. - Pulmonary arteries: PA peak pressure: 20 mm Hg (S). - Systemic veins: The IVC measures <2.1 cm, but does not collapse   >50%, suggesting an elevated RA pressure of 8 mmHg. Impressions: - Compared to a prior  study in 07/2016, the LVEF is lower (but still   normal) at 60-65%, no clear regional wall motion abnormalities.   There is now grade 1 DD with indeterminate LV filling pressure.      PHYSICAL EXAM Vitals:   10/03/16 1453 10/03/16 2207 10/04/16 0500 10/04/16 1415  BP: (!) 150/57 (!) 195/77 (!) 184/66 (!) 158/64  Pulse: 73 74 60 91  Resp: 16 16 16 16   Temp: 98.6 F (37 C) 97.9 F (36.6 C) 98.2 F (36.8 C) 98.2 F (36.8 C)  TempSrc: Oral Oral Axillary Oral  SpO2: 98% 97% 98% 96%            ASSESSMENT/PLAN [name] is a 83 y.o. female with history of unsteady gait, seizures, polycythemia, palpitations, mitral valve prolapse, hypertension and breast cancer, presenting with recent falls and left-sided weakness. She did not receive IV t-PA due to unknown time of onset.  Stroke:  acute/early subacute infarction involving the right occipital lobe - likely due to intracranial atherosclerosis.  Resultant    CT head - expected evolution of the right PCA infarct with cytotoxic edema in the right thalamus and occipital lobe.  MRI head - acute/early subacute  infarction involving the right occipital lobe.  MRA head - not performed  Carotid Doppler - CTA neck  CTA H&N - Positive for a right P1 segment occlusion - severe bilateral PCA stenosis.  2D Echo -  EF  60% to 65%. No cardiac source of emboli identified.  LDL - 65  HgbA1c - 6.0  VTE prophylaxis - SCDs Diet regular Room service appropriate? Yes; Fluid consistency: Thin  aspirin 81 mg daily prior to admission, now on aspirin 325 mg daily and Plavix 75 mg daily x 3 months and then Plavix alone  Patient counseled to be compliant with her antithrombotic medications  Ongoing aggressive stroke risk factor management  Therapy recommendations:  SNF recommended  Disposition:  Pending  Hypertension  Stable  Permissive hypertension (OK if < 220/120) but gradually normalize in 5-7 days  Long-term BP goal  normotensive  Hyperlipidemia  Home meds: No lipid lowering medications prior to admission  LDL 65, goal < 70   Other Stroke Risk Factors  Advanced age  Former smoker - quit more than 30 years ago  ETOH use, advised to drink no more than 1 drink per day  Obesity, There is no height or weight on file to calculate BMI., recommend weight loss, diet and exercise as appropriate    Other Active Problems  Seizure history - on Keppra -    History of palpitations - R/O A. Fib  Anemia - 10.8 / 37.6  Leukocytosis - 18.8 (afebrile)  Recent falls  CT Scan Lt Knee - consistent with hematoma likely represents hemorrhagic prepatellar bursitis.   Hospital day # 2  I have personally examined this patient, reviewed notes, independently viewed imaging studies, participated in medical decision making and plan of care.ROS completed by me personally and pertinent positives fully documented  I have made any additions or clarifications directly to the above note.  She presented with left-sided visual loss secondary to right PCA infarct due to severe intracranial atherosclerosis. Recommend dual antiplatelet therapy for 3 months followed by Plavix alone. Patient advised not to drive. Continue ongoing therapy and rehabilitation needs. Follow-up as an outpatient in the stroke clinic in 6 weeks. Stroke team will sign off. Kindly call for questions. Discussed with patient and nephew. Greater than 50% time during this 25 minute visit was spent on counseling and coordination of care about her stroke discussion about stroke prevention and treatment options.  Antony Contras, MD Medical Director [facility] Pager: [phone] 10/04/2016 4:05 PM   To contact Stroke Continuity provider, please refer to [email or web address] After hours, contact General Neurology

## 2016-10-04 NOTE — Clinical Social Work Note (Signed)
Clinical Social Work Assessment  Patient Details  Name: CHRISTEN WARDROP MRN: [id number] Date of Birth: 1926-09-24  Date of referral:  10/04/16               Reason for consult:  Facility Placement, Discharge Planning                Permission sought to share information with:  Facility Sport and exercise psychologist, Family Supports Permission granted to share information::  Yes, Verbal Permission Granted  Name::     Netty Starring  Agency::  SNF  Relationship::  Children  Contact Information:     Housing/Transportation Living arrangements for the past 2 months:  Single Family Home Source of Information:  Patient, Adult Children Patient Interpreter Needed:  None Criminal Activity/Legal Involvement Pertinent to Current Situation/Hospitalization:  No - Comment as needed Significant Relationships:  Adult Children Lives with:  Self Do you feel safe going back to the place where you live?  Yes Need for family participation in patient care:  No (Coment)  Care giving concerns:  Patient currently lives at home alone, and prior to this hospitalization, was fully independent and driving. Patient currently requires short term rehab in order to recover ability to independently care for self again.   Social Worker assessment / plan:  CSW met with patient and patient's daughter, [name], at bedside to discuss discharge planning. Patient's daughter indicated that patient had been at SNF previously, twice at [facility], and the family did not have a good experience the second time around. Patient's daughter requested ability for patient to be considered for CIR, and CSW explained that it wasn't being recommended at this time. Patient's daughter discussed concerns with locating an appropriate SNF that would provide the level of care that the patient needs in order to be fully independent again. Patient indicated that she wanted to be able to go back home and be independent again. Patient's daughter indicated that she  would research facilities and try to determine where she would like her mother to go for rehab when ready to leave the hospital. CSW to fax out referral, and follow up with daughter for facility choice. CSW to follow to facilitate discharge when medically ready.  Employment status:  Retired Forensic scientist:  Medicare PT Recommendations:  [place] / Referral to community resources:     Patient/Family's Response to care:  Patient is agreeable to SNF placement.  Patient/Family's Understanding of and Emotional Response to Diagnosis, Current Treatment, and Prognosis:  Patient's daughter discussed her frustration with the previous stay at [facility], and that the facility did not have the staff to be able to provide care for the patient. Patient's daughter requested recommendations for facilities with the best therapy and rehab programs, and understood that CSW is not able to make recommendations on facility choices. Patient's daughter indicated understanding of the need to call and visit facilities in order to make a decision. Patient's daughter requested ability to be considered for CIR and did not seem to understand that it wasn't being considered at this time. Patient and patient's daughter indicated understanding of CSW role and appreciated help in locating rehab options.  Emotional Assessment Appearance:  Appears stated age Attitude/Demeanor/Rapport:  Lethargic Affect (typically observed):  Flat Orientation:  Oriented to Self, Oriented to Place, Oriented to  Time, Oriented to Situation Alcohol / Substance use:  Not Applicable Psych involvement (Current and /or in the community):  No (Comment)  Discharge Needs  Concerns to be addressed:  Care Coordination  Readmission within the last 30 days:  No Current discharge risk:  Lives alone, Physical Impairment Barriers to Discharge:  Continued Medical Work up, [place],  [place] 10/04/2016, 1:39 PM

## 2016-10-05 ENCOUNTER — Inpatient Hospital Stay (HOSPITAL_COMMUNITY): Payer: Medicare Other

## 2016-10-05 DIAGNOSIS — W19XXXS Unspecified fall, sequela: Secondary | ICD-10-CM

## 2016-10-05 LAB — CBC
HCT: 35.4 % — ABNORMAL LOW (ref 36.0–46.0)
HEMOGLOBIN: 10.5 g/dL — AB (ref 12.0–15.0)
MCH: 18.5 pg — AB (ref 26.0–34.0)
MCHC: 29.7 g/dL — ABNORMAL LOW (ref 30.0–36.0)
MCV: 62.4 fL — ABNORMAL LOW (ref 78.0–100.0)
Platelets: 848 10*3/uL — ABNORMAL HIGH (ref 150–400)
RBC: 5.67 MIL/uL — AB (ref 3.87–5.11)
RDW: 21.2 % — ABNORMAL HIGH (ref 11.5–15.5)
WBC: 16.2 10*3/uL — ABNORMAL HIGH (ref 4.0–10.5)

## 2016-10-05 LAB — IRON AND TIBC
Iron: 12 ug/dL — ABNORMAL LOW (ref 28–170)
SATURATION RATIOS: 4 % — AB (ref 10.4–31.8)
TIBC: 311 ug/dL (ref 250–450)
UIBC: 299 ug/dL

## 2016-10-05 LAB — RETICULOCYTES
RBC.: 5.7 MIL/uL — AB (ref 3.87–5.11)
Retic Count, Absolute: 119.7 10*3/uL (ref 19.0–186.0)
Retic Ct Pct: 2.1 % (ref 0.4–3.1)

## 2016-10-05 LAB — FERRITIN: Ferritin: 8 ng/mL — ABNORMAL LOW (ref 11–307)

## 2016-10-05 LAB — FOLATE: FOLATE: 6.7 ng/mL (ref >5.9)

## 2016-10-05 LAB — VITAMIN B12: VITAMIN B 12: 955 pg/mL — AB (ref 180–914)

## 2016-10-05 MED ORDER — POLYETHYLENE GLYCOL 3350 17 G PO PACK: 17.0000 g | PACK | Freq: Every day | ORAL | 0 refills | Status: DC

## 2016-10-05 MED ORDER — CLOPIDOGREL BISULFATE 75 MG PO TABS: 75.0000 mg | ORAL_TABLET | Freq: Every day | ORAL | 0 refills | Status: DC

## 2016-10-05 MED ORDER — SENNOSIDES-DOCUSATE SODIUM 8.6-50 MG PO TABS
1.0000 | ORAL_TABLET | Freq: Two times a day (BID) | ORAL | Status: DC
  Administered 2016-10-05 – 2016-10-06 (×3): 1 via ORAL
  Filled 2016-10-05 (×3): qty 1

## 2016-10-05 MED ORDER — ASPIRIN 325 MG PO TABS: 325.0000 mg | ORAL_TABLET | Freq: Every day | ORAL | 0 refills | Status: DC

## 2016-10-05 MED ORDER — GADOBENATE DIMEGLUMINE 529 MG/ML IV SOLN
8.0000 mL | Freq: Once | INTRAVENOUS | Status: AC | PRN
  Administered 2016-10-05: 8 mL via INTRAVENOUS

## 2016-10-05 MED ORDER — SENNOSIDES-DOCUSATE SODIUM 8.6-50 MG PO TABS: 1.0000 | ORAL_TABLET | Freq: Two times a day (BID) | ORAL | 0 refills | Status: DC

## 2016-10-05 MED ORDER — PANTOPRAZOLE SODIUM 40 MG PO TBEC: 40.0000 mg | DELAYED_RELEASE_TABLET | ORAL | Status: AC | PRN

## 2016-10-05 MED ORDER — POLYETHYLENE GLYCOL 3350 17 G PO PACK
17.0000 g | PACK | Freq: Every day | ORAL | Status: DC
  Administered 2016-10-05 – 2016-10-06 (×2): 17 g via ORAL
  Filled 2016-10-05 (×3): qty 1

## 2016-10-05 NOTE — Progress Notes (Signed)
Physical Therapy Treatment Patient Details Name: [name] MRN: [id number] DOB: 13-Jan-1927 Today's Date: 10/05/2016    History of Present Illness Pt is an 83 yo female with onset of mult recent falls was admitted after sustaining a hematoma on L knee and in subpatellar area. MRI: Rt occipital lobe and right lateral thalamus/posterior limb of internal capsule CVA. PMH includes meningiomas, L1-2 compression fxs, acute encephalopathy, breast CA, HTN, seizures, osteoporosis, hip fx.   PT Comments    Pt slowly progressing with mobility, remains limited by pain and fatigue. Demonstrates L-side inattention throughout treatment session, requiring multimodal cues to attend to LUE and LLE for mobility and therex; demonstrates decreased awareness of deficits. Able to complete supine therex. Required min-maxA for bed mobility and pericare secondary to urinary incontinence when attempting to sit EOB. Nurse present and aware of this. Will continue to follow acutely.   Follow Up Recommendations  SNF     Equipment Recommendations  None recommended by PT    Recommendations for Other Services       Precautions / Restrictions Precautions Precautions: Fall Precaution Comments: Pt is having pain with reduced WB tolerance on LLE Restrictions Weight Bearing Restrictions: No    Mobility  Bed Mobility Overal bed mobility: Needs Assistance Bed Mobility: Rolling Rolling: Min assist;Max assist   Supine to sit: Max assist Sit to supine: Total assist   General bed mobility comments: When attempting to sit EOB, pt incontinent of urine. MinA to roll onto L side, pt able to use RUE and RLE to assist and hold onto bed rail; maxA to roll onto R-side, requiring maxA and multimodal cues to assist with left extremities. MaxA to scoot up in bed and use of pillow to elevate LUE  Transfers Overall transfer level: Needs assistance Equipment used: 1 person hand held assist Transfers: Sit to/from Stand Sit to  Stand: Total assist         General transfer comment: Attempted sit<>stand from EOB, unable to achieve full standing posture as Pt requires total assist for transfer completion   Ambulation/Gait                 Stairs            Wheelchair Mobility    Modified Rankin (Stroke Patients Only)       Balance Overall balance assessment: History of Falls;Needs assistance Sitting-balance support: Single extremity supported;Feet supported Sitting balance-Leahy Scale: Zero Sitting balance - Comments: Pt requires Max-total assist to maintain static sitting EOB, strong lean to R side  Postural control: Right lateral lean Standing balance support: Bilateral upper extremity supported Standing balance-Leahy Scale: Zero                              Cognition Arousal/Alertness: Awake/alert Behavior During Therapy: Flat affect Overall Cognitive Status: No family/caregiver present to determine baseline cognitive functioning Area of Impairment: Following commands;Safety/judgement                       Following Commands: Follows multi-step commands inconsistently Safety/Judgement: Decreased awareness of deficits     General Comments: Demonstrates L-side inattention, requiring repeated multimodal cues to attend to L side throughout session. L-side visual deficits with R-sided gaze preference.       Exercises Total Joint Exercises Ankle Circles/Pumps: AROM;Right;AAROM;Left (AAROM for L ankle) Quad Sets: AROM;Both;10 reps (pt able to perform quad set inconsistently on LLE with multimodal cues) Gluteal Sets: Both;10  reps;AROM (pt inconsistent with glut set on L side) Heel Slides: AROM;Right;AAROM;Left;10 reps Straight Leg Raises: AROM;Right;AAROM;Left;10 reps General Exercises - Upper Extremity Shoulder Flexion: AAROM;5 reps;Supine;Left Shoulder Extension: AAROM;5 reps;Supine;Left Shoulder ABduction: AAROM;5 reps;Left;Supine Shoulder ADduction: AAROM;5  reps;Left;Supine Elbow Flexion: AAROM;5 reps;Supine;Left Elbow Extension: AAROM;5 reps;Supine;Left Digit Composite Flexion: AROM;5 reps;Supine;Left Composite Extension: AROM;5 reps;Supine;Left Other Exercises Other Exercises: Pt completed LUE shoulder internal/external rotation to neutral using AAROM for 5 reps in supine     General Comments General comments (skin integrity, edema, etc.): edema/redness noted in LUE       Pertinent Vitals/Pain Pain Assessment: Faces Faces Pain Scale: Hurts little more Pain Location: L knee with mobility Pain Descriptors / Indicators: Aching;Sore;Grimacing Pain Intervention(s): Limited activity within patient's tolerance;Monitored during session;Ice applied    Home Living                      Prior Function            PT Goals (current goals can now be found in the care plan section) Acute Rehab PT Goals Patient Stated Goal: Return home PT Goal Formulation: With patient Time For Goal Achievement: 10/16/16 Potential to Achieve Goals: Fair Progress towards PT goals: Progressing toward goals    Frequency    Min 3X/week      PT Plan Current plan remains appropriate    Co-evaluation              AM-PAC PT "6 Clicks" Daily Activity  Outcome Measure  Difficulty turning over in bed (including adjusting bedclothes, sheets and blankets)?: Unable Difficulty moving from lying on back to sitting on the side of the bed? : Unable Difficulty sitting down on and standing up from a chair with arms (e.g., wheelchair, bedside commode, etc,.)?: Unable Help needed moving to and from a bed to chair (including a wheelchair)?: Total Help needed walking in hospital room?: Total Help needed climbing 3-5 steps with a railing? : Total 6 Click Score: 6    End of Session Equipment Utilized During Treatment: Gait belt Activity Tolerance: Patient limited by fatigue;Patient limited by lethargy;Patient limited by pain Patient left: in bed;with  call bell/phone within reach;with bed alarm set;with nursing/sitter in room Nurse Communication: Mobility status;Other (comment) (pure wick unattached) PT Visit Diagnosis: Unsteadiness on feet (R26.81);History of falling (Z91.81);Muscle weakness (generalized) (M62.81);Difficulty in walking, not elsewhere classified (R26.2);Adult, failure to thrive (R62.7)     Time: 8891-6945 PT Time Calculation (min) (ACUTE ONLY): 26 min  Charges:  $Therapeutic Exercise: 8-22 mins $Therapeutic Activity: 8-22 mins                    G Codes:      Mabeline Caras, PT, DPT Acute Rehab Services  Pager: [place] 10/05/2016, 1:45 PM

## 2016-10-05 NOTE — Consult Note (Signed)
Physical Medicine and Rehabilitation Consult   Reason for Consult: Stroke with deficits affecting mobility and ADLs. Referring Physician: Dr. Karleen Hampshire   HPI: [name] is a 83 y.o. female with history of HTN, atrophic gastritis, meningiomas, multiple falls who was admitted on 10/01/16 with recurrent falls and knee swelling with tenderness. CT knee revealed large prepatellar hematoma likely due to hemorrhagic prepatellar bursitis and  mild OA. CT head negative with stable meningiomas. Neurology consulted due to left sided weakness and MRI brain done revealing acute/early subacute infarct involving right occipital lobe, right lateral thalamus, posterior limb internal capsule and reduced diffusion right hippocampus. CTA head/neck revealed right P1 occlusion with reconstituted R-PCA and superimposed severe B-PCA stenosis and expected evolution fo R-PCA infarct with edema.  Neurology recommended ASA/ Plavix for 3 months followed by Plavix alone for thrombotic stroke due to intracranial atherosclerosis.  2D echo with EF 60-65% with grade 1 diastolic dysfunction and no wall abnormality.  Swallow  Function WNL and reflux precautions recommended. Patient with resultant left sided weakness and left peripheral visual field deficits affecting mobility and ADLs. CIR recommended for follow up therapy.    Review of Systems  HENT: Negative for hearing loss and tinnitus.   Eyes: Negative for blurred vision and double vision.  Respiratory: Negative for sputum production and shortness of breath.   Cardiovascular: Negative for chest pain and palpitations.  Gastrointestinal: Positive for abdominal pain.  Genitourinary: Negative for dysuria and urgency.  Musculoskeletal: Positive for joint pain and myalgias.  Neurological: Positive for focal weakness, weakness and headaches. Negative for dizziness.  Psychiatric/Behavioral: Negative for memory loss. The patient is not nervous/anxious.       Past Medical  History:  Diagnosis Date  . Acute blood loss anemia   . Acute encephalopathy   . Altered mental state   . Arthritis   . Atrophic gastritis   . Breast cancer ([place])    right  . Candida esophagitis ([place])   . Chest tightness   . Chronic gastritis   . Closed hip fracture ([place])   . Closed intertrochanteric fracture of left femur with routine healing   . Diverticulosis   . Hypertension   . IBS (irritable bowel syndrome)   . Mitral valve prolapse   . MVP (mitral valve prolapse)   . Osteoporosis   . Palpitation   . Polycythemia   . PVC's (premature ventricular contractions)   . Rhinitis   . Seizures ([place])   . Unsteady gait     Past Surgical History:  Procedure Laterality Date  . ABDOMINAL HYSTERECTOMY     ovaries taken  . APPENDECTOMY    . BREAST LUMPECTOMY     right  . CARDIOVASCULAR STRESS TEST  12/23/2000   EF 70%  . CATARACT EXTRACTION     bilateral  . ELBOW FRACTURE SURGERY     left  . FEMUR IM NAIL Left 05/08/2015   Procedure: INTERTROCHANTERIC FEMORAL NAIL;  Surgeon: Rod Can, MD;  Location: [facility];  Service: Orthopedics;  Laterality: Left;  . HEMATOMA EVACUATION     right  . TONSILLECTOMY    . TRANSTHORACIC ECHOCARDIOGRAM  03/05/2010   EF 55-60%    Family History  Problem Relation Age of Onset  . Stomach cancer Mother   . Hypertension Father   . Stroke Father   . Prostate cancer Father   . Hypertension Sister   . Hypertension Brother   . Prostate cancer Brother   . Diabetes Brother   .  Colon cancer Neg Hx     Social History: Lives alone. Independent but sedentary PTA. Per  reports that she quit smoking about 36 years ago. She has never used smokeless tobacco. Per reports that she drinks alcohol. She reports that she does not use drugs.    Allergies  Allergen Reactions  . Sulfa Antibiotics     From childhood; patient doesn't recall reaction  . Sulfur     From childhood; patient doesn't recall reaction    Medications Prior to Admission    Medication Sig Dispense Refill  . aspirin 81 MG tablet Take 81 mg by mouth daily.      . Calcium Carbonate-Vitamin D (CALCIUM 500/D PO) Take 1 tablet by mouth daily.     . cetirizine (ZYRTEC) 10 MG tablet Take 10 mg by mouth daily as needed for allergies.    . fluticasone (FLONASE) 50 MCG/ACT nasal spray Place 2 sprays into both nostrils as needed for allergies or rhinitis. Reported on 06/11/2015    . levETIRAcetam (KEPPRA) 500 MG tablet Take 1 tablet (500 mg total) by mouth 2 (two) times daily. 180 tablet 4  . levothyroxine (SYNTHROID, LEVOTHROID) 25 MCG tablet Take 25 mcg by mouth daily before breakfast.     . loperamide (IMODIUM) 2 MG capsule Take 2 mg by mouth as needed for diarrhea or loose stools.    . metoprolol succinate (TOPROL-XL) 50 MG 24 hr tablet TAKE 1 TABLET ONCE DAILY WITH FOOD. (Patient taking differently: Take 50 mg by mouth once a day) 90 tablet 0  . olmesartan (BENICAR) 20 MG tablet Take 1 tablet (20 mg total) by mouth daily. 90 tablet 3  . Probiotic Product (ULTRAFLORA IMMUNE HEALTH PO) Take 1 tablet by mouth daily. Reported on 07/01/2015    . hydroxyurea (HYDREA) 500 MG capsule Take 1 capsule (500 mg total) by mouth every other day. May take with food to minimize GI side effects. 20 capsule 0    Home: Home Living Family/patient expects to be discharged to:: Skilled nursing facility Living Arrangements: Alone Available Help at Discharge: Family, Available PRN/intermittently Type of Home: House Home Access: Stairs to enter [employer] of Steps: 2 Home Layout: Two level, Able to live on main level with bedroom/bathroom Bathroom Shower/Tub: Tub/shower unit, Architectural technologist: Standard Bathroom Accessibility: Yes Home Equipment: Environmental consultant - 2 wheels, Cane - single point  Functional History: Prior Function Level of Independence: Independent Comments: has not driven since her seizure a few months ago Functional Status:  Mobility: Bed Mobility Overal bed  mobility: Needs Assistance Bed Mobility: Supine to Sit, Sit to Supine Supine to sit: Max assist Sit to supine: Total assist General bed mobility comments: Pt requires assist for LLE management and to bring trunk into upright position to sit EOB; total assist for returning to supine  Transfers Overall transfer level: Needs assistance Equipment used: 1 person hand held assist Transfers: Sit to/from Stand Sit to Stand: Total assist Stand pivot transfers: Max assist General transfer comment: Attempted sit<>stand from EOB, unable to achieve full standing posture as Pt requires total assist for transfer completion  Ambulation/Gait General Gait Details: unable to walk    ADL: ADL Overall ADL's : Needs assistance/impaired Eating/Feeding: Moderate assistance (supported sitting) Grooming: Minimal assistance, Bed level, Wash/dry face, Brushing hair Grooming Details (indicate cue type and reason): min verbal cues for initiating and attending to L side  Upper Body Bathing: Moderate assistance (supported sitting) Lower Body Bathing: Maximal assistance (mod A sit<>stand and max A to maintain)  Upper Body Dressing : Total assistance (supported sitting) Lower Body Dressing: Total assistance (mod A sit<>stand, max A to maintain standing) Toilet Transfer: Maximal assistance, Stand-pivot Toileting- Clothing Manipulation and Hygiene: Total assistance (mod A sit<>stand, Max A to maintain standing) Functional mobility during ADLs: Maximal assistance, Total assistance General ADL Comments: Pt requires Max-Total assist for bed mobility and supported sitting at EOB as Pt demonstrating strong R lateral lean, unable to keep head elevated despite verbal cues; attempted sit<>stand from EOB with Pt requiring total assist this session, unable to achieve full standing position  Cognition: Cognition Overall Cognitive Status: Within Functional Limits for tasks assessed Orientation Level: Oriented to person, Oriented  to place, Oriented to time, Disoriented to situation Cognition Arousal/Alertness: Lethargic Behavior During Therapy: Flat affect Overall Cognitive Status: Within Functional Limits for tasks assessed General Comments: Pt with increased lethargy as session progressed; required multimodal cues for remaining awake/alert   Blood pressure (!) 157/57, pulse 69, temperature 97.8 F (36.6 C), temperature source Oral, resp. rate 18, SpO2 96 %. Physical Exam  Nursing note and vitals reviewed. Constitutional: She is oriented to person, place, and time. She appears well-developed. She appears cachectic. She is sleeping. She is easily aroused.  Elderly female slumped in the bed to the right. Arouses easily and answers appropriately.   HENT:  Head: Normocephalic and atraumatic.  Eyes: Pupils are equal, round, and reactive to light. Conjunctivae are normal.  Neck: Normal range of motion. Neck supple.  Cardiovascular: Normal rate and regular rhythm.   Respiratory: Effort normal. No stridor. No respiratory distress. She has decreased breath sounds in the right lower field and the left lower field.  GI: Soft. Bowel sounds are normal. She exhibits no distension. There is no tenderness.  Musculoskeletal: She exhibits no edema or tenderness.  Left knee edema with ace wrap.   Neurological: She is alert, oriented to person, place, and time and easily aroused. A cranial nerve deficit is present.  Left neglect and needed cues to turn eyes to left field. Speech clear. Able to follow simple motor commands without difficulty. Left hemiparesis--LUE 1/5 when engaging, LLE 0/5.   Skin: Skin is warm and dry.  Numerous bruises and ecchymoses over both arms, left more than right  Psychiatric: Her speech is normal. Her affect is blunt. She is slowed.    No results found for this or any previous visit (from the past 24 hour(s)). No results found.  Assessment/Plan: Diagnosis: multiple right brain infarcts due to  intracranial athersclerosis 1. Does the need for close, 24 hr/day medical supervision in concert with the patient's rehab needs make it unreasonable for this patient to be served in a less intensive setting? Yes 2. Co-Morbidities requiring supervision/potential complications: recent falls, numerous pain complaints, soft tissue injuries 3. Due to bladder management, bowel management, safety, skin/wound care, disease management, medication administration, pain management and patient education, does the patient require 24 hr/day rehab nursing? Potentially 4. Does the patient require coordinated care of a physician, rehab nurse, PT (1-2 hrs/day, 5 days/week), OT (1-2 hrs/day, 5 days/week) and SLP (1-2 hrs/day, 5 days/week) to address physical and functional deficits in the context of the above medical diagnosis(es)? No Addressing deficits in the following areas: balance, endurance, locomotion, strength, transferring, bowel/bladder control, bathing, dressing, feeding, grooming, toileting, cognition and psychosocial support 5. Can the patient actively participate in an intensive therapy program of at least 3 hrs of therapy per day at least 5 days per week? No 6. The potential for patient to make  measurable gains while on inpatient rehab is fair 7. Anticipated functional outcomes upon discharge from inpatient rehab are n/a  with PT, n/a with OT, n/a with SLP. 8. Estimated rehab length of stay to reach the above functional goals is: n/a 9. Anticipated D/C setting: Other 10. Anticipated post D/C treatments: [facility] therapy 11. Overall Rehab/Functional Prognosis: good  RECOMMENDATIONS: This patient's condition is appropriate for continued rehabilitative care in the following setting: SNF Patient has agreed to participate in recommended program. Potentially Note that insurance prior authorization may be required for reimbursement for recommended care.  Comment: [name] would have a hard time tolerating the  intensity of CIR therapy. Also she has been struggling at home, it appears, for some time and would likely do better with a longer term facility where she might be able to transition to ALF from.    Meredith Staggers, MD, [facility] 10/05/2016    Bary Leriche, PA-C 10/05/2016

## 2016-10-05 NOTE — Discharge Summary (Signed)
Physician Discharge Summary  [name] GYI:[id number] DOB: 1926-03-31 DOA: 10/01/2016  PCP: Deland Pretty, MD  Admit date: 10/01/2016 Discharge date: 10/05/2016  Admitted From: Home.  Disposition:  SNF  Recommendations for Outpatient Follow-up:  1. Follow up with PCP in 1-2 weeks 2. Please obtain BMP/CBC in one week 3. Follow up with neurology in 6 weeks.  4. Follow up PCP regarding the anemia panel.  5. Please follow up with orthopedics in one week.     Discharge Condition: stable.  CODE STATUS:DNR  Diet recommendation: Heart Healthy    Brief/Interim Summary: Kelly Pillard Kivettis a 83 y.o.femalewith medical history significant of hypertension, gastritis, multiple falls in the past , was brought in by her son for a fall one week ago and a fall today, multiple falls voer the 2 months. MRI brain shows Acute/early subacute infarction involving the right occipital lobe and separately the right lateral thalamus/posterior limb of internal capsule. Reduced diffusion in right hippocampus may represent infarction or secondary seizure activity.  Discharge Diagnoses:  Active Problems:   Fall   Cerebral thrombosis with cerebral infarction  Acute stroke of the right occipital lobe/ right lateral thalamus sec to cerebral atherosclerosis:  Stroke work up done.  CT angio of the head and neck Expected evolution of the right PCA infarct with cytotoxic edema in the right thalamus and occipital lobe but no hemorrhage or mass effect.  Echocardiogram Compared to a prior study in 07/2016, the LVEF is lower (but still normal) at 60-65%, no clear regional wall motion abnormalities. There is now grade 1 DD with indeterminate LV filling pressure. LDL is 65, HDL is 38.  hgba1c is 6.   Started on aspirin 325 mg daily. Added plavix by neurologist for dual anti platelet therapy for 3 months followed by plavix alone. Follow up with neurologist as outpatient in 6 weeks.     Left knee hematoma:   Ace bandage and ice.  PT evaluation recommended SNF.  Mobility as tolerated.  Pain control.    Hypertension:  Well controlled.   GERD:  On protonix.    Seizures:  Resume Keppra.    H/o polycythemia vera:  Resume hydroxyurea. Recommend follow up with oncologist.    Hypothyroidism:  Resume synthroid.   Normocytic anemia; anemia of blood loss from hematoma and acute illness.  Recommend checking anemia panel.       Discharge Instructions  Discharge Instructions    Diet - low sodium heart healthy    Complete by:  As directed    Discharge instructions    Complete by:  As directed    [name] PCP in one week.     Allergies as of 10/05/2016      Reactions   Sulfa Antibiotics    From childhood; patient doesn't recall reaction   Sulfur    From childhood; patient doesn't recall reaction      Medication List    STOP taking these medications   loperamide 2 MG capsule Commonly known as:  IMODIUM     TAKE these medications   aspirin 325 MG tablet Take 1 tablet (325 mg total) by mouth daily. What changed:  medication strength  how much to take   CALCIUM 500/D PO Take 1 tablet by mouth daily.   cetirizine 10 MG tablet Commonly known as:  ZYRTEC Take 10 mg by mouth daily as needed for allergies.   clopidogrel 75 MG tablet Commonly known as:  PLAVIX Take 1 tablet (75 mg total) by  mouth daily.   fluticasone 50 MCG/ACT nasal spray Commonly known as:  FLONASE Place 2 sprays into both nostrils as needed for allergies or rhinitis. Reported on 06/11/2015   hydroxyurea 500 MG capsule Commonly known as:  HYDREA Take 1 capsule (500 mg total) by mouth every other day. May take with food to minimize GI side effects.   levETIRAcetam 500 MG tablet Commonly known as:  KEPPRA Take 1 tablet (500 mg total) by mouth 2 (two) times daily.   levothyroxine 25 MCG tablet Commonly known as:  SYNTHROID, LEVOTHROID Take 25 mcg by mouth daily  before breakfast.   metoprolol succinate 50 MG 24 hr tablet Commonly known as:  TOPROL-XL TAKE 1 TABLET ONCE DAILY WITH FOOD. What changed:  See the new instructions.   olmesartan 20 MG tablet Commonly known as:  BENICAR Take 1 tablet (20 mg total) by mouth daily.   pantoprazole 40 MG tablet Commonly known as:  PROTONIX Take 1 tablet (40 mg total) by mouth as needed (for stomach issues). What changed:  when to take this   Jackson Take 1 tablet by mouth daily. Reported on 07/01/2015            Discharge Care Instructions        Start     Ordered   10/06/16 0000  aspirin 325 MG tablet  Daily     10/05/16 1221   10/06/16 0000  clopidogrel (PLAVIX) 75 MG tablet  Daily     10/05/16 1221   10/05/16 0000  pantoprazole (PROTONIX) 40 MG tablet  As needed     10/05/16 1221   10/05/16 0000  Diet - low sodium heart healthy     10/05/16 1221   10/05/16 0000  Discharge instructions    Comments:  PLEASE FOLLOW UP WITH PCP in one week.   10/05/16 1221     Follow-up Information    Deland Pretty, MD. Schedule an appointment as soon as possible for a visit in 1 week(s).   Specialty:  Internal Medicine Contact information: [address] [place] [address] [phone]          Allergies  Allergen Reactions  . Sulfa Antibiotics     From childhood; patient doesn't recall reaction  . Sulfur     From childhood; patient doesn't recall reaction    Consultations:  Orthopedics  Neurology.    Procedures/Studies: Ct Angio Head W Or Wo Contrast  Result Date: 10/02/2016 CLINICAL DATA:  83 year old female with recent syncope and falls. Acute right PCA infarct on brain MRI today. 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 EXAM: CT ANGIOGRAPHY HEAD AND NECK TECHNIQUE: Multidetector CT imaging of the head and neck was performed using the standard protocol during bolus administration of intravenous contrast. Multiplanar  CT image reconstructions and MIPs were obtained to evaluate the vascular anatomy. Carotid stenosis measurements (when applicable) are obtained utilizing NASCET criteria, using the distal internal carotid diameter as the denominator. CONTRAST:  50 mL Isovue 370 COMPARISON:  Brain MRI 0213 hours today. CT head and cervical spine 10/01/2016 and earlier. FINDINGS: CT HEAD Brain: Cytotoxic edema in the right thalamus and occipital lobe as expected given the diffusion abnormality on MRI earlier today. No associated hemorrhage or mass effect. Elsewhere stable bilateral confluent white matter T2 and FLAIR hyperintensity. Stable ventricle size and configuration. No acute intracranial hemorrhage identified. Calvarium and skull base: Stable and negative. Paranasal sinuses: Visualized paranasal sinuses and mastoids are stable and well  pneumatized. Orbits: Stable orbit and scalp soft tissues. CTA NECK Skeleton: No acute osseous abnormality identified. Upper chest: Negative lung apices. No superior mediastinal lymphadenopathy. Other neck: Negative.  No lymphadenopathy. Aortic arch: Ectatic aortic arch with a mildly kinked appearance. No arch atherosclerosis. No great vessel origin stenosis. Right carotid system: Normal aside from ectasia and minimal calcified plaque at the anterior right ICA bulb. Left carotid system: Normal aside from ectasia. Vertebral arteries:No proximal subclavian artery plaque or stenosis. Normal vertebral artery origins. Fairly codominant vertebral arteries are patent to the skullbase without stenosis. CTA HEAD Posterior circulation: Normal distal vertebral arteries. Normal PICA origins and vertebrobasilar junction. Normal basilar artery. Normal SCA origins. The left PCA origin is patent. The left P1 segment is tortuous there is probably a diminutive left posterior communicating artery. There is a severe stenosis of the left PCA distal P1 are proximal to or proximal P2 segment with preserved distal  enhancement (series 15 image 19). On the right side a fetal type right PCA is suggested, but there is evidence of superimposed right P1 segment thrombosis just beyond its origin (series 13, image 113). The right P2 segment is patent, but there is a moderate to severe stenosis at the right P3 branching (series 15, image 19). There is preserved but attenuated distal right PCA enhancement. Anterior circulation: Both ICA siphons are patent and normal aside from minimal calcified plaque, greater on the left. Normal ophthalmic and posterior communicating artery origins. Normal carotid termini, MCA and ACA origins. Anterior communicating artery and proximal ACA branches are within normal limits. There is mild to moderate left greater than right distal ACA irregularity and stenosis, maximal in the pericallosal artery region (series 21, image 15). Left MCA M1 segment and bifurcation are normal. Right MCA M1 segment and bifurcation are normal. There is minimal bilateral distal MCA branch irregularity. Venous sinuses: Patent. Anatomic variants: Possible fetal type right PCA origin, uncertain. Delayed phase: No abnormal enhancement identified. Review of the MIP images confirms the above findings IMPRESSION: 1. Positive for a right P1 segment occlusion, but reconstituted right PCA flow via the right posterior communicating artery. Superimposed severe bilateral PCA stenosis, and on the right attenuated P3 branch enhancement. 2. Normal vertebral and basilar arteries. No significant carotid atherosclerosis, there is bilateral carotid artery ectasia. 3. Distal bilateral ACA atherosclerosis and stenosis. MCA branches are normal for age. 4. Expected evolution of the right PCA infarct with cytotoxic edema in the right thalamus and occipital lobe but no hemorrhage or mass effect. 5. No new intracranial abnormality. 6. Ectatic aortic arch. Electronically Signed   By: Genevie Ann M.D.   On: 10/02/2016 14:14   Dg Chest 2 View  Result Date:  10/01/2016 CLINICAL DATA:  Fall.  Cough. EXAM: CHEST  2 VIEW COMPARISON:  07/25/2016. FINDINGS: Normal heart size. No pleural effusion or edema. The lungs are hyperinflated but clear. No airspace opacities identified. The bones appear diffusely osteopenic. IMPRESSION: 1. Lungs appear hyperinflated but clear. No posttraumatic abnormalities identified. Electronically Signed   By: Kerby Moors M.D.   On: 10/01/2016 11:44   Dg Pelvis 1-2 Views  Result Date: 10/01/2016 CLINICAL DATA:  Pain and swelling secondary to a fall today. EXAM: PELVIS - 1-2 VIEW COMPARISON:  05/08/2015 FINDINGS: There is no acute fracture. There is an old deformity of the left pubic body and left inferior and superior pubic rami with healed fractures. There is an old healed intertrochanteric fracture of the proximal left femur. Screw and intramedullary nail in place in  the proximal left femur. IMPRESSION: No acute abnormalities. Electronically Signed   By: Lorriane Shire M.D.   On: 10/01/2016 11:54   Dg Forearm Left  Result Date: 10/01/2016 CLINICAL DATA:  Left arm bruising secondary to a fall today. EXAM: LEFT FOREARM - 2 VIEW COMPARISON:  Radiographs dated 06/19/2004 FINDINGS: There is no acute fracture or dislocation or joint effusion. Previous open reduction and internal fixation of olecranon fracture which is now completely healed. K-wire and cerclage wire in place. IMPRESSION: No acute abnormalities. Electronically Signed   By: Lorriane Shire M.D.   On: 10/01/2016 11:53   Dg Tibia/fibula Left  Result Date: 10/01/2016 CLINICAL DATA:  Leg pain secondary to a fall today. EXAM: LEFT TIBIA AND FIBULA - 2 VIEW COMPARISON:  None. FINDINGS: There is no evidence of fracture or other focal bone lesions. Prominent soft tissue swelling anterior to the patella. IMPRESSION: No acute bone abnormality. Soft tissue swelling anterior to the patella. Electronically Signed   By: Lorriane Shire M.D.   On: 10/01/2016 11:50   Ct Head Wo  Contrast  Result Date: 10/01/2016 CLINICAL DATA:  Multiple falls and abrasions. Back pain. Initial encounter. EXAM: CT HEAD WITHOUT CONTRAST CT CERVICAL SPINE WITHOUT CONTRAST TECHNIQUE: Multidetector CT imaging of the head and cervical spine was performed following the standard protocol without intravenous contrast. Multiplanar CT image reconstructions of the cervical spine were also generated. COMPARISON:  Head CT 07/24/2016 FINDINGS: CT HEAD FINDINGS Brain: No evidence of acute infarction, hemorrhage, hydrocephalus, or extra-axial collection. There is cerebral atrophy, prominent in the medial temporal lobes, a pattern that can be seen with Alzheimer's disease. Chronic small vessel ischemia with extensive cerebral white matter low-density. There is an isodense mass on the midline anterior cranial fossa floor best seen on reformats and measures 9 x 16 mm. There is also a calcified extra-axial mass along the left middle cranial fossa floor measuring 12 mm. No associated significant mass effect or brain edema. Vascular: Atherosclerotic calcification. Skull: Negative for fracture Sinuses/Orbits: No evidence of injury. Bilateral cataract resection. CT CERVICAL SPINE FINDINGS Alignment: No traumatic malalignment. Skull base and vertebrae: Negative for fracture. Soft tissues and spinal canal: No prevertebral fluid or swelling. No visible canal hematoma. Disc levels: C4-5, C5-6, and C6-7 advanced disc degeneration. No evidence of cord impingement. Upper chest: No evidence of injury. IMPRESSION: 1. No evidence of intracranial or cervical spine injury. 2. Atrophy and extensive chronic small vessel ischemia. 3. Known small meningiomas in the anterior and left middle cranial fossae. No significant mass effect or brain edema. Electronically Signed   By: Monte Fantasia M.D.   On: 10/01/2016 10:59   Ct Angio Neck W Or Wo Contrast  Result Date: 10/02/2016 CLINICAL DATA:  83 year old female with recent syncope and falls.  Acute right PCA infarct on brain MRI today. 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 EXAM: CT ANGIOGRAPHY HEAD AND NECK TECHNIQUE: Multidetector CT imaging of the head and neck was performed using the standard protocol during bolus administration of intravenous contrast. Multiplanar CT image reconstructions and MIPs were obtained to evaluate the vascular anatomy. Carotid stenosis measurements (when applicable) are obtained utilizing NASCET criteria, using the distal internal carotid diameter as the denominator. CONTRAST:  50 mL Isovue 370 COMPARISON:  Brain MRI 0213 hours today. CT head and cervical spine 10/01/2016 and earlier. FINDINGS: CT HEAD Brain: Cytotoxic edema in the right thalamus and occipital lobe as expected given the diffusion abnormality on MRI earlier today. No associated hemorrhage or mass  effect. Elsewhere stable bilateral confluent white matter T2 and FLAIR hyperintensity. Stable ventricle size and configuration. No acute intracranial hemorrhage identified. Calvarium and skull base: Stable and negative. Paranasal sinuses: Visualized paranasal sinuses and mastoids are stable and well pneumatized. Orbits: Stable orbit and scalp soft tissues. CTA NECK Skeleton: No acute osseous abnormality identified. Upper chest: Negative lung apices. No superior mediastinal lymphadenopathy. Other neck: Negative.  No lymphadenopathy. Aortic arch: Ectatic aortic arch with a mildly kinked appearance. No arch atherosclerosis. No great vessel origin stenosis. Right carotid system: Normal aside from ectasia and minimal calcified plaque at the anterior right ICA bulb. Left carotid system: Normal aside from ectasia. Vertebral arteries:No proximal subclavian artery plaque or stenosis. Normal vertebral artery origins. Fairly codominant vertebral arteries are patent to the skullbase without stenosis. CTA HEAD Posterior circulation: Normal distal vertebral arteries. Normal PICA origins and  vertebrobasilar junction. Normal basilar artery. Normal SCA origins. The left PCA origin is patent. The left P1 segment is tortuous there is probably a diminutive left posterior communicating artery. There is a severe stenosis of the left PCA distal P1 are proximal to or proximal P2 segment with preserved distal enhancement (series 15 image 19). On the right side a fetal type right PCA is suggested, but there is evidence of superimposed right P1 segment thrombosis just beyond its origin (series 13, image 113). The right P2 segment is patent, but there is a moderate to severe stenosis at the right P3 branching (series 15, image 19). There is preserved but attenuated distal right PCA enhancement. Anterior circulation: Both ICA siphons are patent and normal aside from minimal calcified plaque, greater on the left. Normal ophthalmic and posterior communicating artery origins. Normal carotid termini, MCA and ACA origins. Anterior communicating artery and proximal ACA branches are within normal limits. There is mild to moderate left greater than right distal ACA irregularity and stenosis, maximal in the pericallosal artery region (series 21, image 15). Left MCA M1 segment and bifurcation are normal. Right MCA M1 segment and bifurcation are normal. There is minimal bilateral distal MCA branch irregularity. Venous sinuses: Patent. Anatomic variants: Possible fetal type right PCA origin, uncertain. Delayed phase: No abnormal enhancement identified. Review of the MIP images confirms the above findings IMPRESSION: 1. Positive for a right P1 segment occlusion, but reconstituted right PCA flow via the right posterior communicating artery. Superimposed severe bilateral PCA stenosis, and on the right attenuated P3 branch enhancement. 2. Normal vertebral and basilar arteries. No significant carotid atherosclerosis, there is bilateral carotid artery ectasia. 3. Distal bilateral ACA atherosclerosis and stenosis. MCA branches are  normal for age. 4. Expected evolution of the right PCA infarct with cytotoxic edema in the right thalamus and occipital lobe but no hemorrhage or mass effect. 5. No new intracranial abnormality. 6. Ectatic aortic arch. Electronically Signed   By: Genevie Ann M.D.   On: 10/02/2016 14:14   Ct Cervical Spine Wo Contrast  Result Date: 10/01/2016 CLINICAL DATA:  Multiple falls and abrasions. Back pain. Initial encounter. EXAM: CT HEAD WITHOUT CONTRAST CT CERVICAL SPINE WITHOUT CONTRAST TECHNIQUE: Multidetector CT imaging of the head and cervical spine was performed following the standard protocol without intravenous contrast. Multiplanar CT image reconstructions of the cervical spine were also generated. COMPARISON:  Head CT 07/24/2016 FINDINGS: CT HEAD FINDINGS Brain: No evidence of acute infarction, hemorrhage, hydrocephalus, or extra-axial collection. There is cerebral atrophy, prominent in the medial temporal lobes, a pattern that can be seen with Alzheimer's disease. Chronic small vessel ischemia with extensive cerebral  white matter low-density. There is an isodense mass on the midline anterior cranial fossa floor best seen on reformats and measures 9 x 16 mm. There is also a calcified extra-axial mass along the left middle cranial fossa floor measuring 12 mm. No associated significant mass effect or brain edema. Vascular: Atherosclerotic calcification. Skull: Negative for fracture Sinuses/Orbits: No evidence of injury. Bilateral cataract resection. CT CERVICAL SPINE FINDINGS Alignment: No traumatic malalignment. Skull base and vertebrae: Negative for fracture. Soft tissues and spinal canal: No prevertebral fluid or swelling. No visible canal hematoma. Disc levels: C4-5, C5-6, and C6-7 advanced disc degeneration. No evidence of cord impingement. Upper chest: No evidence of injury. IMPRESSION: 1. No evidence of intracranial or cervical spine injury. 2. Atrophy and extensive chronic small vessel ischemia. 3. Known  small meningiomas in the anterior and left middle cranial fossae. No significant mass effect or brain edema. Electronically Signed   By: Monte Fantasia M.D.   On: 10/01/2016 10:59   Ct Lumbar Spine Wo Contrast  Result Date: 10/01/2016 CLINICAL DATA:  Low back pain.  Multiple falls. Initial encounter. EXAM: CT LUMBAR SPINE WITHOUT CONTRAST TECHNIQUE: Multidetector CT imaging of the lumbar spine was performed without intravenous contrast administration. Multiplanar CT image reconstructions were also generated. COMPARISON:  05/17/2014 lumbar spine MRI FINDINGS: Segmentation: Standard Alignment: Moderate levoscoliosis centered at L2. Vertebrae: Negative for acute fracture. Remote L1 superior endplate and L2 compression fractures. Height loss is moderate and retropulsion is mild at L2. Remote left twelfth rib and L1/L2 left transverse process fractures. Bilateral sacroiliac osteoarthritis with vacuum phenomenon. Paraspinal and other soft tissues: No acute finding. Partly seen bladder is moderately distended. Extensive sigmoid diverticulosis. Disc levels: T12- L1: No evidence of impingement L1-L2: Disc degeneration with narrowing and bulging. Negative facets. Mild bilateral subarticular recess and spinal stenosis due to prior retropulsion. L2-L3: Disc degeneration with vacuum phenomenon throughout degenerative disc clefts. The disc is narrowed and bulging with inferior foraminal narrowing that is noncompressive. Patent spinal canal. L3-L4: Degenerative disc narrowing and circumferential bulging. Inferior foraminal narrowing without compression. L4-L5: Disc narrowing and bulging. Asymmetric left degenerative facet spurring and ligamentum flavum thickening. Mild left subarticular recess narrowing. Mild left foraminal narrowing. L5-S1:Chronic pars defect on the right. Mild disc narrowing and desiccation. Left foraminal disc bulge with L5 impingement. Patent spinal canal. IMPRESSION: 1. No acute finding. 2. Remote L1 and  L2 compression fractures. 3. Scoliosis and spinal degeneration. Notable impingement at the left L5 S1 foramen due to disc bulge. 4. L5 chronic right pars defect. Electronically Signed   By: Monte Fantasia M.D.   On: 10/01/2016 11:08   Ct Knee Left Wo Contrast  Result Date: 10/01/2016 CLINICAL DATA:  Left knee pain after fall. EXAM: CT OF THE LEFT KNEE WITHOUT CONTRAST TECHNIQUE: Multidetector CT imaging of the left knee was performed according to the standard protocol. Multiplanar CT image reconstructions were also generated. COMPARISON:  Left knee x-rays from same day. FINDINGS: Bones/Joint/Cartilage No acute fracture or malalignment. Partially visualized distal femoral intramedullary rod. Mild tricompartmental degenerative changes of the knee. Chondrocalcinosis. No joint effusion. Ligaments Suboptimally assessed by CT. Muscles and Tendons No focal abnormality. Soft tissues Large hyperdense fluid collection over the anterior knee, more prominent on the medial aspect. The collection measures approximately 3.4 x 7.9 x 10.3 cm (AP by transverse by CC). There is prominent soft tissue swelling about the medial and lateral knee. IMPRESSION: 1. Large hyperdense fluid collection over the anterior knee, more prominent on the medial aspect, consistent with hematoma.  Findings likely represent hemorrhagic prepatellar bursitis. 2. No acute osseous abnormality of the knee. Mild tricompartmental degenerative changes. Electronically Signed   By: Titus Dubin M.D.   On: 10/01/2016 16:29   Mr Brain Wo Contrast  Addendum Date: 10/02/2016   ADDENDUM REPORT: 10/02/2016 03:08 ADDENDUM: Stable right paramedian frontal and left middle cranial fossa meningioma (series 10, image 17 and 22). Electronically Signed   By: Kristine Garbe M.D.   On: 10/02/2016 03:08   Result Date: 10/02/2016 CLINICAL DATA:  83 y/o  F; recurrent syncope. EXAM: MRI HEAD WITHOUT CONTRAST TECHNIQUE: Multiplanar, multiecho pulse sequences of the  brain and surrounding structures were obtained without intravenous contrast. COMPARISON:  07/25/2016 MRI of the head.  10/01/2016 CT of the head. FINDINGS: Brain: Reduced diffusion within the right occipital lobe and the right lateral thalamus/ posterior limb of internal capsule compatible with acute/early subacute infarction. Additionally there is faint reduced diffusion in the right thalamus which may also represent infarct or secondary seizure activity. Areas of infarction are associated with mild T2 hyperintense signal abnormality. There is no mass effect or hemorrhage. Stable background of advanced chronic microvascular ischemic changes and parenchymal volume loss of the brain. No hydrocephalus, effacement of basilar cisterns, focal mass effect, or herniation. Vascular: Normal flow voids. Skull and upper cervical spine: Heterogeneous bone marrow signal likely related to history of polycythemia vera and thrombocytosis. Sinuses/Orbits: Bilateral intra-ocular lens replacement. Mild mucosal thickening of ethmoid air cells. No abnormal signal of mastoid air cells. Other: None. IMPRESSION: 1. Acute/early subacute infarction involving the right occipital lobe and separately the right lateral thalamus/posterior limb of internal capsule. Reduced diffusion in right hippocampus may represent infarction or secondary seizure activity. 2. No acute hemorrhage or significant mass effect. 3. Stable background of advanced chronic microvascular ischemic changes and parenchymal volume loss of the brain. These results will be called to the ordering clinician or representative by the Radiologist Assistant, and communication documented in the PACS or zVision Dashboard. Electronically Signed: By: Kristine Garbe M.D. On: 10/02/2016 02:59   Dg Knee Complete 4 Views Left  Result Date: 10/01/2016 CLINICAL DATA:  Knee pain secondary to a fall today. EXAM: LEFT KNEE - COMPLETE 4+ VIEW COMPARISON:  05/08/2015 FINDINGS: There is  no fracture or dislocation. There is prominent soft tissue swelling anterior to the patella. Diffuse osteopenia. Chondrocalcinosis. Tiny marginal osteophytes. IMPRESSION: No acute bone abnormality.  Arthritic changes as described. Prominent soft tissue swelling anterior to the patella. Electronically Signed   By: Lorriane Shire M.D.   On: 10/01/2016 11:49   Dg Humerus Left  Result Date: 10/01/2016 CLINICAL DATA:  Left arm pain secondary to a fall today. EXAM: LEFT HUMERUS - 2+ VIEW COMPARISON:  None. FINDINGS: There is no evidence of fracture or other focal bone lesions. Soft tissues are unremarkable. IMPRESSION: Normal left humerus. Electronically Signed   By: Lorriane Shire M.D.   On: 10/01/2016 11:51   Dg Femur Min 2 Views Left  Result Date: 10/01/2016 CLINICAL DATA:  Left hip pain secondary to a fall today. EXAM: LEFT FEMUR 2 VIEWS COMPARISON:  Radiographs dated 05/08/2015 FINDINGS: There is no acute fracture. No dislocation. Old healed intertrochanteric fracture of the proximal left femur. Old pubic fractures. Intramedullary nail and screws in place in the femur. Marked anterior soft tissue swelling over the patella. IMPRESSION: No acute abnormality of the left femur. Soft tissue swelling over the patella. Electronically Signed   By: Lorriane Shire M.D.   On: 10/01/2016 11:47  Subjective: No new complaints.   Discharge Exam: Vitals:   10/04/16 2137 10/05/16 0424  BP: (!) 147/64 (!) 157/57  Pulse: 74 69  Resp: 15 18  Temp: 98 F (36.7 C) 97.8 F (36.6 C)  SpO2: 96% 96%   Vitals:   10/04/16 0500 10/04/16 1415 10/04/16 2137 10/05/16 0424  BP: (!) 184/66 (!) 158/64 (!) 147/64 (!) 157/57  Pulse: 60 91 74 69  Resp: 16 16 15 18   Temp: 98.2 F (36.8 C) 98.2 F (36.8 C) 98 F (36.7 C) 97.8 F (36.6 C)  TempSrc: Axillary Oral Oral Oral  SpO2: 98% 96% 96% 96%    General: Pt is alert, awake, not in acute distress Cardiovascular: RRR, S1/S2 +, no rubs, no gallops Respiratory:  CTA bilaterally, no wheezing, no rhonchi Abdominal: Soft, NT, ND, bowel sounds + Extremities: LEFT KNEE HEMATOMA IN ACE BANDAGE.     The results of significant diagnostics from this hospitalization (including imaging, microbiology, ancillary and laboratory) are listed below for reference.     Microbiology: No results found for this or any previous visit (from the past 240 hour(s)).   Labs: BNP (last 3 results)  Recent Labs  07/25/16 0505  BNP 643.3*   Basic Metabolic Panel:  Recent Labs Lab 10/01/16 1025 10/02/16 1143  NA 139 135  K 4.2 4.3  CL 109 106  CO2 22 21*  GLUCOSE 114* 108*  BUN 14 14  CREATININE 1.06* 0.95  CALCIUM 8.3* 7.9*   Liver Function Tests:  Recent Labs Lab 10/01/16 1025  AST 20  ALT 10*  ALKPHOS 61  BILITOT 0.8  PROT 5.7*  ALBUMIN 3.3*   No results for input(s): LIPASE, AMYLASE in the last 168 hours. No results for input(s): AMMONIA in the last 168 hours. CBC:  Recent Labs Lab 10/01/16 1025 10/02/16 1143 10/04/16 0236  WBC 15.1* 18.8* 14.3*  NEUTROABS 13.4*  --   --   HGB 11.2* 10.8* 9.5*  HCT 38.8 37.6 33.2*  MCV 63.0* 62.5* 62.4*  PLT 382 435* 528*   Cardiac Enzymes: No results for input(s): CKTOTAL, CKMB, CKMBINDEX, TROPONINI in the last 168 hours. BNP: Invalid input(s): POCBNP CBG: No results for input(s): GLUCAP in the last 168 hours. D-Dimer No results for input(s): DDIMER in the last 72 hours. Hgb A1c No results for input(s): HGBA1C in the last 72 hours. Lipid Profile  Recent Labs  10/03/16 0412  CHOL 117  HDL 38*  LDLCALC 65  TRIG 72  CHOLHDL 3.1   Thyroid function studies No results for input(s): TSH, T4TOTAL, T3FREE, THYROIDAB in the last 72 hours.  Invalid input(s): FREET3 Anemia work up No results for input(s): VITAMINB12, FOLATE, FERRITIN, TIBC, IRON, RETICCTPCT in the last 72 hours. Urinalysis    Component Value Date/Time   COLORURINE YELLOW 10/01/2016 1035   APPEARANCEUR CLEAR 10/01/2016  1035   LABSPEC 1.009 10/01/2016 1035   PHURINE 5.0 10/01/2016 1035   GLUCOSEU NEGATIVE 10/01/2016 1035   HGBUR NEGATIVE 10/01/2016 1035   BILIRUBINUR NEGATIVE 10/01/2016 1035   KETONESUR NEGATIVE 10/01/2016 1035   PROTEINUR NEGATIVE 10/01/2016 1035   UROBILINOGEN 0.2 08/27/2006 2231   NITRITE NEGATIVE 10/01/2016 1035   LEUKOCYTESUR TRACE (A) 10/01/2016 1035   Sepsis Labs Invalid input(s): PROCALCITONIN,  WBC,  LACTICIDVEN Microbiology No results found for this or any previous visit (from the past 240 hour(s)).   Time coordinating discharge: Over 30 minutes  SIGNED:   Hosie Poisson, MD  [facility] 10/05/2016, 12:22 PM Pager   If 7PM-7AM,  please contact night-coverage [email or web address] Password [email or web address]

## 2016-10-05 NOTE — Progress Notes (Signed)
Pt not a candidate for an inpt rehab per Dr. Naaman Plummer who assessed pt today. I have alerted RN CM and SW. We will sign off. [phone]

## 2016-10-05 NOTE — Social Work (Addendum)
CSW spoke with son/daughter and they want to get into CIR for rehab.   CSW discussed with the family that the doctor put in the DC summary and son indicated that he was advised by doctor that she would not be going today.  CSW indicated that patient would need to transition to placement if doctor put in DC summary. Family indicated otherwise and CSW will f/u with doctor again on the plan.  Elissa Hefty, LCSW Clinical Social Worker [phone]

## 2016-10-05 NOTE — Progress Notes (Signed)
CSW following to assist with discharge planning throughout the day today. CSW contacted by patient's daughter to discuss bed offers and determine bed availability. CSW provided information on availability at [facility], [facility], [facility], and [facility].   Patient's daughter had multiple questions about medical information and prognosis, and requested to be able to speak to the doctor. CSW alerted MD.   CSW will continue to follow to facilitate discharge to SNF when medically ready.  Laveda Abbe,  Clinical Social Worker [phone]

## 2016-10-05 NOTE — Social Work (Signed)
CSW met with son at bedside and patient and discussed offer. CSW will provide family list of SNF's to pick a SNF.  Elissa Hefty, LCSW Clinical Social Worker [phone]

## 2016-10-05 NOTE — Progress Notes (Signed)
Occupational Therapy Treatment Patient Details Name: [name] MRN: [id number] DOB: March 02, 1926 Today's Date: 10/05/2016    History of present illness 83 yo female with onset of mult recent falls was admitted after sustaining a hematoma on L knee and in subpatellar area. MRI: Rt occipital lobe and right lateral thalamus/posterior limb of internal capsule CVA   PMHx:  meningiomas, L1 L 2 compression fractures, acute encephalopathy, breast CA, HTN, seizures, osteoporosis, hip fracture,    OT comments  Pt making slow progress towards goals. Requires Max-total assist for bed mobility, maintaining static sitting balance, and for sit<>stand at EOB this session. Pt completed grooming ADLs at bed level with setup and min verbal cues for initiation/attending to L side. Completed LUE AAROM at bed level for continued strengthening/ROM. Will continue to follow acutely and continue to recommend SNF placement after discharge to progress Pt's safety and independence with ADLs and functional mobility prior to return home .   Follow Up Recommendations  SNF;Supervision/Assistance - 24 hour    Equipment Recommendations  Other (comment) (TBD in next venue )          Precautions / Restrictions Precautions Precautions: Fall Precaution Comments: Pt is having pain with reduced WB tolerance on LLE Restrictions Weight Bearing Restrictions: No       Mobility Bed Mobility Overal bed mobility: Needs Assistance Bed Mobility: Supine to Sit;Sit to Supine     Supine to sit: Max assist Sit to supine: Total assist   General bed mobility comments: Pt requires assist for LLE management and to bring trunk into upright position to sit EOB; total assist for returning to supine   Transfers Overall transfer level: Needs assistance Equipment used: 1 person hand held assist Transfers: Sit to/from Stand Sit to Stand: Total assist         General transfer comment: Attempted sit<>stand from EOB, unable to  achieve full standing posture as Pt requires total assist for transfer completion     Balance Overall balance assessment: History of Falls;Needs assistance Sitting-balance support: Single extremity supported;Feet supported Sitting balance-Leahy Scale: Zero Sitting balance - Comments: Pt requires Max-total assist to maintain static sitting EOB, strong lean to R side  Postural control: Right lateral lean Standing balance support: Bilateral upper extremity supported Standing balance-Leahy Scale: Zero                             ADL either performed or assessed with clinical judgement   ADL Overall ADL's : Needs assistance/impaired     Grooming: Minimal assistance;Bed level;Wash/dry face;Brushing hair Grooming Details (indicate cue type and reason): min verbal cues for initiating and attending to L side                              Functional mobility during ADLs: Maximal assistance;Total assistance General ADL Comments: Pt requires Max-Total assist for bed mobility and supported sitting at EOB as Pt demonstrating strong R lateral lean, unable to keep head elevated despite verbal cues; attempted sit<>stand from EOB with Pt requiring total assist this session, unable to achieve full standing position     Vision   Additional Comments: Pt able to track to midline and slightly past midline to locate hairbrush, unable to maintain head/neck posture at midline               Cognition Arousal/Alertness: Lethargic Behavior During Therapy: Flat affect Overall Cognitive Status: Within  Functional Limits for tasks assessed                                 General Comments: Pt with increased lethargy as session progressed; required multimodal cues for remaining awake/alert         Exercises General Exercises - Upper Extremity Shoulder Flexion: AAROM;5 reps;Supine;Left Shoulder Extension: AAROM;5 reps;Supine;Left Shoulder ABduction: AAROM;5  reps;Left;Supine Shoulder ADduction: AAROM;5 reps;Left;Supine Elbow Flexion: AAROM;5 reps;Supine;Left Elbow Extension: AAROM;5 reps;Supine;Left Digit Composite Flexion: AROM;5 reps;Supine;Left Composite Extension: AROM;5 reps;Supine;Left Other Exercises Other Exercises: Pt completed LUE shoulder internal/external rotation to neutral using AAROM for 5 reps in supine           General Comments edema/redness noted in LUE     Pertinent Vitals/ Pain       Pain Assessment: Faces Faces Pain Scale: Hurts little more Pain Location: left knee Pain Descriptors / Indicators: Aching;Sore Pain Intervention(s): Monitored during session;Repositioned;Ice applied                                                          Frequency  Min 2X/week        Progress Toward Goals  OT Goals(current goals can now be found in the care plan section)     Acute Rehab OT Goals Patient Stated Goal: none stated OT Goal Formulation: With patient/family Time For Goal Achievement: 10/16/16 Potential to Achieve Goals: Good  Plan Discharge plan remains appropriate                     AM-PAC PT "6 Clicks" Daily Activity     Outcome Measure   Help from another person eating meals?: A Lot Help from another person taking care of personal grooming?: A Lot Help from another person toileting, which includes using toliet, bedpan, or urinal?: Total Help from another person bathing (including washing, rinsing, drying)?: A Lot Help from another person to put on and taking off regular upper body clothing?: Total Help from another person to put on and taking off regular lower body clothing?: Total 6 Click Score: 9    End of Session    OT Visit Diagnosis: Unsteadiness on feet (R26.81);Other abnormalities of gait and mobility (R26.89);Repeated falls (R29.6);Muscle weakness (generalized) (M62.81);History of falling (Z91.81);Low vision, both eyes (H54.2);Other symptoms and signs  involving cognitive function;Other symptoms and signs involving the nervous system (R29.898);Hemiplegia and hemiparesis Hemiplegia - Right/Left: Left Hemiplegia - dominant/non-dominant: Non-Dominant Hemiplegia - caused by: Cerebral infarction   Activity Tolerance Patient limited by lethargy;Patient tolerated treatment well   Patient Left in bed;with call bell/phone within reach;with bed alarm set;with SCD's reapplied   Nurse Communication Other (comment);Mobility status (NT; need for new pure wick )        Time: 7209-4709 OT Time Calculation (min): 32 min  Charges: OT Treatments $Self Care/Home Management : 23-37 mins  Lou Cal, OT Pager [phone] 10/05/2016    Raymondo Band 10/05/2016, 11:55 AM

## 2016-10-06 DIAGNOSIS — Z23 Encounter for immunization: Secondary | ICD-10-CM | POA: Diagnosis not present

## 2016-10-06 DIAGNOSIS — F329 Major depressive disorder, single episode, unspecified: Secondary | ICD-10-CM | POA: Diagnosis not present

## 2016-10-06 DIAGNOSIS — M1990 Unspecified osteoarthritis, unspecified site: Secondary | ICD-10-CM | POA: Diagnosis not present

## 2016-10-06 DIAGNOSIS — E039 Hypothyroidism, unspecified: Secondary | ICD-10-CM

## 2016-10-06 DIAGNOSIS — M6281 Muscle weakness (generalized): Secondary | ICD-10-CM | POA: Diagnosis not present

## 2016-10-06 DIAGNOSIS — R110 Nausea: Secondary | ICD-10-CM | POA: Diagnosis not present

## 2016-10-06 DIAGNOSIS — I10 Essential (primary) hypertension: Secondary | ICD-10-CM | POA: Diagnosis not present

## 2016-10-06 DIAGNOSIS — I341 Nonrheumatic mitral (valve) prolapse: Secondary | ICD-10-CM | POA: Diagnosis not present

## 2016-10-06 DIAGNOSIS — D45 Polycythemia vera: Secondary | ICD-10-CM

## 2016-10-06 DIAGNOSIS — I6339 Cerebral infarction due to thrombosis of other cerebral artery: Secondary | ICD-10-CM | POA: Diagnosis not present

## 2016-10-06 DIAGNOSIS — R531 Weakness: Secondary | ICD-10-CM | POA: Diagnosis not present

## 2016-10-06 DIAGNOSIS — M810 Age-related osteoporosis without current pathological fracture: Secondary | ICD-10-CM | POA: Diagnosis not present

## 2016-10-06 DIAGNOSIS — R488 Other symbolic dysfunctions: Secondary | ICD-10-CM | POA: Diagnosis not present

## 2016-10-06 DIAGNOSIS — G8190 Hemiplegia, unspecified affecting unspecified side: Secondary | ICD-10-CM | POA: Diagnosis not present

## 2016-10-06 DIAGNOSIS — R600 Localized edema: Secondary | ICD-10-CM | POA: Diagnosis not present

## 2016-10-06 DIAGNOSIS — I63331 Cerebral infarction due to thrombosis of right posterior cerebral artery: Secondary | ICD-10-CM | POA: Diagnosis not present

## 2016-10-06 DIAGNOSIS — T148XXA Other injury of unspecified body region, initial encounter: Secondary | ICD-10-CM | POA: Diagnosis not present

## 2016-10-06 DIAGNOSIS — I6330 Cerebral infarction due to thrombosis of unspecified cerebral artery: Secondary | ICD-10-CM

## 2016-10-06 DIAGNOSIS — R1319 Other dysphagia: Secondary | ICD-10-CM | POA: Diagnosis not present

## 2016-10-06 DIAGNOSIS — S8002XA Contusion of left knee, initial encounter: Secondary | ICD-10-CM | POA: Diagnosis not present

## 2016-10-06 DIAGNOSIS — I639 Cerebral infarction, unspecified: Secondary | ICD-10-CM | POA: Diagnosis not present

## 2016-10-06 DIAGNOSIS — Z86000 Personal history of in-situ neoplasm of breast: Secondary | ICD-10-CM | POA: Diagnosis not present

## 2016-10-06 DIAGNOSIS — R296 Repeated falls: Secondary | ICD-10-CM | POA: Diagnosis not present

## 2016-10-06 DIAGNOSIS — R278 Other lack of coordination: Secondary | ICD-10-CM | POA: Diagnosis not present

## 2016-10-06 DIAGNOSIS — Z8673 Personal history of transient ischemic attack (TIA), and cerebral infarction without residual deficits: Secondary | ICD-10-CM | POA: Diagnosis not present

## 2016-10-06 DIAGNOSIS — G464 Cerebellar stroke syndrome: Secondary | ICD-10-CM | POA: Diagnosis not present

## 2016-10-06 DIAGNOSIS — Z4789 Encounter for other orthopedic aftercare: Secondary | ICD-10-CM | POA: Diagnosis not present

## 2016-10-06 DIAGNOSIS — S728X9A Other fracture of unspecified femur, initial encounter for closed fracture: Secondary | ICD-10-CM | POA: Diagnosis not present

## 2016-10-06 MED ORDER — HYDROXYUREA 500 MG PO CAPS: ORAL_CAPSULE | ORAL | 0 refills | Status: AC

## 2016-10-06 MED ORDER — BISACODYL 10 MG RE SUPP
10.0000 mg | Freq: Once | RECTAL | Status: AC
  Administered 2016-10-06: 10 mg via RECTAL
  Filled 2016-10-06: qty 1

## 2016-10-06 NOTE — Progress Notes (Signed)
[name]   DOB:07-Jun-1926   HD#:[id number]   [id number]#:[id number]  Hematology follow up   Subjective: Pt is well-known to me, under my care for PV. She was recently admitted for stroke. Patient was recently seen by my nurse practitioner in the office, daughter was concerned about the prevascular side effect from anagrelide, I called her last week, and switched her to [facility]. Per patient, she did take a few doses of Hydrea (500mg  every other day). She suffered a fall at home one week ago, and again on the day of admission. She was found to have left knee swelling and tenderness and bruising. X-ray showed left knee hematoma. She also noted left side weakness, brain MRI 10/02/2016 showed Acute/early subacute infarction involving the right occipital lobe and separately the right lateral thalamus/posterior limb of internal capsule. She is being discharged to SNF today.    Objective:  Vitals:   10/06/16 0954 10/06/16 1500  BP: (!) 156/66 (!) 160/59  Pulse: 85 85  Resp: 12 16  Temp:  99.2 F (37.3 C)  SpO2: 94% 97%    There is no height or weight on file to calculate BMI.  Intake/Output Summary (Last 24 hours) at 10/06/16 1955 Last data filed at 10/06/16 1500  Gross per 24 hour  Intake              240 ml  Output              700 ml  Net             -460 ml     Sclerae unicteric  Oropharynx clear  No peripheral adenopathy  Lungs clear -- no rales or rhonchi  Heart regular rate and rhythm  Abdomen benign  SKIN: Diffuse ecchymosis in the left upper lung and lower extremities  Neuro (+) left side weakness     CBG (last 3)  No results for input(s): GLUCAP in the last 72 hours.   Labs:  Urine Studies No results for input(s): UHGB, CRYS in the last 72 hours.  Invalid input(s): UACOL, UAPR, USPG, UPH, UTP, UGL, UKET, UBIL, UNIT, UROB, ULEU, UEPI, UWBC, URBC, UBAC, CAST, UCOM, BILUA  Basic Metabolic Panel:  Recent Labs Lab 10/01/16 1025 10/02/16 1143  NA 139 135  K 4.2 4.3  CL 109  106  CO2 22 21*  GLUCOSE 114* 108*  BUN 14 14  CREATININE 1.06* 0.95  CALCIUM 8.3* 7.9*   GFR CrCl cannot be calculated (Unknown ideal weight.). Liver Function Tests:  Recent Labs Lab 10/01/16 1025  AST 20  ALT 10*  ALKPHOS 61  BILITOT 0.8  PROT 5.7*  ALBUMIN 3.3*   No results for input(s): LIPASE, AMYLASE in the last 168 hours. No results for input(s): AMMONIA in the last 168 hours. Coagulation profile  Recent Labs Lab 10/01/16 1025  INR 1.20    CBC:  Recent Labs Lab 10/01/16 1025 10/02/16 1143 10/04/16 0236 10/05/16 1400  WBC 15.1* 18.8* 14.3* 16.2*  NEUTROABS 13.4*  --   --   --   HGB 11.2* 10.8* 9.5* 10.5*  HCT 38.8 37.6 33.2* 35.4*  MCV 63.0* 62.5* 62.4* 62.4*  PLT 382 435* 528* 848*   Cardiac Enzymes: No results for input(s): CKTOTAL, CKMB, CKMBINDEX, TROPONINI in the last 168 hours. BNP: Invalid input(s): POCBNP CBG: No results for input(s): GLUCAP in the last 168 hours. D-Dimer No results for input(s): DDIMER in the last 72 hours. Hgb A1c No results for input(s): HGBA1C in the last  72 hours. Lipid Profile No results for input(s): CHOL, HDL, LDLCALC, TRIG, CHOLHDL, LDLDIRECT in the last 72 hours. Thyroid function studies No results for input(s): TSH, T4TOTAL, T3FREE, THYROIDAB in the last 72 hours.  Invalid input(s): FREET3 Anemia work up  Recent Labs  10/05/16 1349  VITAMINB12 955*  FOLATE 6.7  FERRITIN 8*  TIBC 311  IRON 12*  RETICCTPCT 2.1   Microbiology No results found for this or any previous visit (from the past 240 hour(s)).    Studies:  Mr Virgel Paling LP Contrast  Result Date: 10/05/2016 CLINICAL DATA:  Initial evaluation for acute stroke. EXAM: MRI HEAD WITHOUT AND WITH CONTRAST MRA HEAD WITHOUT CONTRAST TECHNIQUE: Multiplanar, multiecho pulse sequences of the brain and surrounding structures were obtained without and with intravenous contrast. Angiographic images of the head were obtained using MRA technique without  contrast. CONTRAST:  26mL MULTIHANCE GADOBENATE DIMEGLUMINE 529 MG/ML IV SOLN COMPARISON:  Comparison made with prior CT from 10/02/2016. FINDINGS: MRI HEAD FINDINGS Brain: Diffuse prominence of the CSF containing spaces compatible with generalized age-related cerebral atrophy. Patchy and confluent T2/FLAIR hyperintensity within the periventricular and deep white matter both cerebral hemispheres most consistent with chronic small vessel ischemic disease, moderate in nature. Confluent restricted diffusion involving the right thalamus/ posterior limb of the right internal capsule as well as the parasagittal right occipital lobe, consistent with acute right PCA territory infarct. Patchy involvement within the mesial right temporal lobe at the right hippocampus.No significant edema or associated hemorrhage. Faint approximate 6 mm diffusion abnormality involving the right cingulate cortex suspicious for possible tiny acute/subacute ischemic right ACA territory infarct. This is best seen on coronal DWI sequence (series 4, image 20). No associated hemorrhage or mass effect. No other acute or subacute infarct identified. No other areas of chronic infarction. No evidence for acute or chronic intracranial hemorrhage. 1.8 x 1.7 x 0.8 cm enhancing extra-axial lesion along the planum sphenoid now light consistent with a small meningioma (series 13, image 22). Additional round 14 mm meningioma present at the left middle cranial fossa (series 11, image 9). No associated edema or mass effect. No other mass lesion. No midline shift. Mild ventricular prominence related global parenchymal volume loss of hydrocephalus. No extra-axial fluid collection. Major dural sinuses are grossly patent. Pituitary gland suprasellar region within normal limits.  Lesion. Vascular: Major intracranial vascular flow voids maintained. Skull and upper cervical spine: Craniocervical junction within normal limits. Visualized upper cervical spine within normal  limits. Bone marrow signal intensity within normal limits. No scalp soft tissue abnormality. Sinuses/Orbits: Globes and orbital soft tissues within normal limits. Patient status post lens extraction bilaterally. Paranasal sinuses and mastoids are clear. Inner ear structures grossly normal. MRA HEAD FINDINGS ANTERIOR CIRCULATION: Distal cervical segments of the internal carotid arteries are patent with antegrade flow. Petrous, cavernous, and supraclinoid segments patent without flow-limiting stenosis. ICA termini patent female laterally. A1 segments patent bilaterally, with the right being dominant. Multifocal atheromatous irregularity involving the anterior cerebral arteries bilaterally, stable from recent CTA. M1 segments patent without high-grade stenosis or occlusion. MCA bifurcations normal. No proximal M2 occlusion. Distal MCA branches are symmetric and well opacified bilaterally, although demonstrate small vessel or irregularity. POSTERIOR CIRCULATION: Vertebral artery's patent to the vertebrobasilar junction without flow-limiting stenosis. Posterior inferior cerebral arteries patent proximally. Basilar artery patent to its distal aspect. Superior cerebral arteries patent bilaterally. Abrupt right P1 occlusion, stable from previous CTA. Multifocal atheromatous irregularity with signal loss within the left P 2 segment. Left PCA is patent to  its distal aspect. Right PCA not visualized. No aneurysm or vascular malformation. IMPRESSION: MRI HEAD IMPRESSION: 1. Moderate to large evolving acute right PCA territory infarct. No associated hemorrhage or mass effect. 2. Subtle subcentimeter cortical diffusion abnormality involving the right cingulate, suspicious for acute/early subacute right ACA territory infarct. No associated hemorrhage. 3. Extra-axial meningioma [place] along the planum sphenoid alley and left middle cranial fossa as above. No associated edema. 4. Moderate cerebral atrophy with chronic microvascular  ischemic disease. MRA HEAD IMPRESSION: 1. Acute right P1 occlusion, stable relative to recent CTA. 2. Advanced multifocal atheromatous regularity throughout the left PCA and its branches, also stable from recent CTA. 3. Distal bilateral ACA atherosclerosis and stenoses, also stable. Electronically Signed   By: Jeannine Boga M.D.   On: 10/05/2016 21:18   Mr Jeri Cos EK Contrast  Result Date: 10/05/2016 CLINICAL DATA:  Initial evaluation for acute stroke. EXAM: MRI HEAD WITHOUT AND WITH CONTRAST MRA HEAD WITHOUT CONTRAST TECHNIQUE: Multiplanar, multiecho pulse sequences of the brain and surrounding structures were obtained without and with intravenous contrast. Angiographic images of the head were obtained using MRA technique without contrast. CONTRAST:  92mL MULTIHANCE GADOBENATE DIMEGLUMINE 529 MG/ML IV SOLN COMPARISON:  Comparison made with prior CT from 10/02/2016. FINDINGS: MRI HEAD FINDINGS Brain: Diffuse prominence of the CSF containing spaces compatible with generalized age-related cerebral atrophy. Patchy and confluent T2/FLAIR hyperintensity within the periventricular and deep white matter both cerebral hemispheres most consistent with chronic small vessel ischemic disease, moderate in nature. Confluent restricted diffusion involving the right thalamus/ posterior limb of the right internal capsule as well as the parasagittal right occipital lobe, consistent with acute right PCA territory infarct. Patchy involvement within the mesial right temporal lobe at the right hippocampus.No significant edema or associated hemorrhage. Faint approximate 6 mm diffusion abnormality involving the right cingulate cortex suspicious for possible tiny acute/subacute ischemic right ACA territory infarct. This is best seen on coronal DWI sequence (series 4, image 20). No associated hemorrhage or mass effect. No other acute or subacute infarct identified. No other areas of chronic infarction. No evidence for acute or  chronic intracranial hemorrhage. 1.8 x 1.7 x 0.8 cm enhancing extra-axial lesion along the planum sphenoid now light consistent with a small meningioma (series 13, image 22). Additional round 14 mm meningioma present at the left middle cranial fossa (series 11, image 9). No associated edema or mass effect. No other mass lesion. No midline shift. Mild ventricular prominence related global parenchymal volume loss of hydrocephalus. No extra-axial fluid collection. Major dural sinuses are grossly patent. Pituitary gland suprasellar region within normal limits.  Lesion. Vascular: Major intracranial vascular flow voids maintained. Skull and upper cervical spine: Craniocervical junction within normal limits. Visualized upper cervical spine within normal limits. Bone marrow signal intensity within normal limits. No scalp soft tissue abnormality. Sinuses/Orbits: Globes and orbital soft tissues within normal limits. Patient status post lens extraction bilaterally. Paranasal sinuses and mastoids are clear. Inner ear structures grossly normal. MRA HEAD FINDINGS ANTERIOR CIRCULATION: Distal cervical segments of the internal carotid arteries are patent with antegrade flow. Petrous, cavernous, and supraclinoid segments patent without flow-limiting stenosis. ICA termini patent female laterally. A1 segments patent bilaterally, with the right being dominant. Multifocal atheromatous irregularity involving the anterior cerebral arteries bilaterally, stable from recent CTA. M1 segments patent without high-grade stenosis or occlusion. MCA bifurcations normal. No proximal M2 occlusion. Distal MCA branches are symmetric and well opacified bilaterally, although demonstrate small vessel or irregularity. POSTERIOR CIRCULATION: Vertebral artery's patent to the  vertebrobasilar junction without flow-limiting stenosis. Posterior inferior cerebral arteries patent proximally. Basilar artery patent to its distal aspect. Superior cerebral arteries  patent bilaterally. Abrupt right P1 occlusion, stable from previous CTA. Multifocal atheromatous irregularity with signal loss within the left P 2 segment. Left PCA is patent to its distal aspect. Right PCA not visualized. No aneurysm or vascular malformation. IMPRESSION: MRI HEAD IMPRESSION: 1. Moderate to large evolving acute right PCA territory infarct. No associated hemorrhage or mass effect. 2. Subtle subcentimeter cortical diffusion abnormality involving the right cingulate, suspicious for acute/early subacute right ACA territory infarct. No associated hemorrhage. 3. Extra-axial meningioma [place] along the planum sphenoid alley and left middle cranial fossa as above. No associated edema. 4. Moderate cerebral atrophy with chronic microvascular ischemic disease. MRA HEAD IMPRESSION: 1. Acute right P1 occlusion, stable relative to recent CTA. 2. Advanced multifocal atheromatous regularity throughout the left PCA and its branches, also stable from recent CTA. 3. Distal bilateral ACA atherosclerosis and stenoses, also stable. Electronically Signed   By: Jeannine Boga M.D.   On: 10/05/2016 21:18    Assessment: 83 y.o. with past medical history of hypertension, polycythemia vera, hip fracture, presented with multiple fall and left side weakness, left knee hematoma.  1. Acute ischemic CVA 2. HTN 3. PV 4. Fall  5. Left knee hematoma  6. Seizure  7. Hypothyroidism  Plan:  -Her platelets was normal when she was admitted on 10/01/2016, pre-counts has been significantly increased in the past few days, likely related to being off treatment -I recently changed her anagrelide to to [facility], she had thrombocytopenia when she was on Hydrea before, we'll give low dose. Giving her significant thrombocytosis now, I recommend continue Hydrea 500 mg daily for 4 days, then changed to every other day. -She is on aspirin and Plavix due to her recent stroke. -She is being discharged to nursing home today, I have asked  instruction of Hydrea, and request lab CBC every week on Monday, until I see her in 3 weeks.    Truitt Merle, MD 10/06/2016  7:55 PM

## 2016-10-06 NOTE — Progress Notes (Signed)
Report called to Ess[place]-RN at [facility]. All questions/concerns addressed. Pt has not had a BM since 09/30/16 (though pt's abd is soft with active bowel sounds) and facility requesting suppository. Dr. Bonner Puna paged and received orders for one time dose of Bisacodyl suppository. Orders placed and pt updated. [facility] to transport. Will continue to monitor

## 2016-10-06 NOTE — Clinical Social Work Placement (Signed)
   CLINICAL SOCIAL WORK PLACEMENT  NOTE  Date:  10/06/2016  Patient Details  Name: [name] MRN: [id number] Date of Birth: 14-May-1926  Clinical Social Work is seeking post-discharge placement for this patient at the [place] level of care (*CSW will initial, date and re-position this form in  chart as items are completed):  Yes   Patient/family provided with [place] Work Department's list of facilities offering this level of care within the geographic area requested by the patient (or if unable, by the patient's family).  Yes   Patient/family informed of their freedom to choose among providers that offer the needed level of care, that participate in Medicare, Medicaid or managed care program needed by the patient, have an available bed and are willing to accept the patient.  Yes   Patient/family informed of [facility] ownership interest in [facility] and [facility], as well as of the fact that they are under no obligation to receive care at these facilities.  PASRR submitted to EDS on 10/04/16     PASRR number received on       Existing PASRR number confirmed on 10/04/16     FL2 transmitted to all facilities in geographic area requested by pt/family on       FL2 transmitted to all facilities within larger geographic area on       Patient informed that his/her managed care company has contracts with or will negotiate with certain facilities, including the following:        Yes   Patient/family informed of bed offers received.  Patient chooses bed at [facility]     Physician recommends and patient chooses bed at      Patient to be transferred to [facility] on 10/06/16.  Patient to be transferred to facility by [facility]     Patient family notified on 10/06/16 of transfer.  Name of family member notified:  son [name]     PHYSICIAN Please prepare priority discharge summary, including medications, Please prepare  prescriptions, Please sign FL2, Please sign DNR     Additional Comment:    _______________________________________________ Normajean Baxter, LCSW 10/06/2016, 11:39 AM

## 2016-10-06 NOTE — Social Work (Signed)
CSW met with son at bedside and discussed placement. CSW advised that [facility] was unable to take patient.  CSW discussed with son that patient will now need a SNF. Son has accepted bed at [facility].   Per nurse, Dr. Bonner Puna will meet with patient shortly.  CSW will f/u for DC as SNF is ready to take patient.  CSW will continue to follow.  Elissa Hefty, LCSW Clinical Social Worker [phone]

## 2016-10-06 NOTE — Care Management Important Message (Signed)
Important Message  Patient Details  Name: [name] MRN: [id number] Date of Birth: 1926-02-08   Medicare Important Message Given:  Yes    [name] 10/06/2016, 11:40 AM

## 2016-10-06 NOTE — Social Work (Signed)
Clinical Social Worker facilitated patient discharge including contacting patient family and facility to confirm patient discharge plans.  Clinical information faxed to facility and family agreeable with plan.    CSW arranged ambulance transport via [facility] to [facility].    RN to call [phone] to give report prior to discharge.Pt going to Room 1108.  Clinical Social Worker will sign off for now as social work intervention is no longer needed. Please consult [place] again if new need arises.  Elissa Hefty, LCSW Clinical Social Worker [phone]

## 2016-10-06 NOTE — Discharge Summary (Addendum)
Physician Discharge Summary  [name] QJJ:[id number] DOB: 1926/11/01 DOA: 10/01/2016  PCP: Deland Pretty, MD  Admit date: 10/01/2016 Discharge date: 10/06/2016  Admitted From: Home.  Disposition:  SNF  Recommendations for Outpatient Follow-up:  1. Follow up with PCP in 1-2 weeks 2. Please obtain BMP/CBC in one week 3. Follow up with neurology in 6 weeks.  4. Follow up PCP regarding the anemia panel.  5. Please follow up with orthopedics in one week.  6. Monitor bowel movements, manage constipation. 7. Will need therapy 6 days per week. 8. Please give hydroxyurea 500mg  po daily for 4 days, then return to every other day dosing, per Dr. Burr Medico.    Discharge Condition: stable.  CODE STATUS:DNR  Diet recommendation: Heart Healthy    Brief/Interim Summary: Kelly Tello Kivettis an 83 y.o.femalewith medical history significant of hypertension, gastritis, multiple falls in the past , was brought in by her son for a fall one week ago and a fall today, multiple falls voer the 2 months. MRI brain shows Acute/early subacute infarction involving the right occipital lobe and separately the right lateral thalamus/posterior limb of internal capsule. Reduced diffusion in right hippocampus may represent infarction or secondary seizure activity.  Discharge Diagnoses:  Active Problems:   Fall   Cerebral thrombosis with cerebral infarction  Acute stroke of the right occipital lobe/ right lateral thalamus sec to cerebral atherosclerosis:  Stroke work up done.  CT angio of the head and neck Expected evolution of the right PCA infarct with cytotoxic edema in the right thalamus and occipital lobe but no hemorrhage or mass effect.  Echocardiogram Compared to a prior study in 07/2016, the LVEF is lower (but still normal) at 60-65%, no clear regional wall motion abnormalities. There is now grade 1 DD with indeterminate LV filling pressure. LDL is 65, HDL is 38.  hgba1c is 6.   Started on aspirin 325  mg daily. Added plavix by neurologist for dual anti platelet therapy for 3 months followed by plavix alone. Follow up with neurologist as outpatient in 6 weeks.     Left knee hematoma:  Ace bandage and ice.  PT evaluation recommended SNF.  Mobility as tolerated.  Pain control.    Hypertension:  Well controlled.   GERD:  On protonix.    Seizures:  Resume Keppra.    H/o polycythemia vera:  Resume hydroxyurea. Recommend follow up with oncologist.    Hypothyroidism:  Resume synthroid.   Normocytic anemia; anemia of blood loss from hematoma and acute illness.  Recommend checking anemia panel.       Discharge Instructions  Discharge Instructions    Diet - low sodium heart healthy    Complete by:  As directed    Discharge instructions    Complete by:  As directed    [place] PCP in one week.     Allergies as of 10/05/2016      Reactions   Sulfa Antibiotics    From childhood; patient doesn't recall reaction   Sulfur    From childhood; patient doesn't recall reaction      Medication List    STOP taking these medications   loperamide 2 MG capsule Commonly known as:  IMODIUM     TAKE these medications   aspirin 325 MG tablet Take 1 tablet (325 mg total) by mouth daily. What changed:  medication strength  how much to take   CALCIUM 500/D PO Take 1 tablet by mouth daily.   cetirizine 10 MG  tablet Commonly known as:  ZYRTEC Take 10 mg by mouth daily as needed for allergies.   clopidogrel 75 MG tablet Commonly known as:  PLAVIX Take 1 tablet (75 mg total) by mouth daily.   fluticasone 50 MCG/ACT nasal spray Commonly known as:  FLONASE Place 2 sprays into both nostrils as needed for allergies or rhinitis. Reported on 06/11/2015   hydroxyurea 500 MG capsule Commonly known as:  HYDREA Take 500 mg po daily for 4 days, then every other day   levETIRAcetam 500 MG tablet Commonly known as:  KEPPRA Take 1 tablet (500 mg  total) by mouth 2 (two) times daily.   levothyroxine 25 MCG tablet Commonly known as:  SYNTHROID, LEVOTHROID Take 25 mcg by mouth daily before breakfast.   metoprolol succinate 50 MG 24 hr tablet Commonly known as:  TOPROL-XL TAKE 1 TABLET ONCE DAILY WITH FOOD. What changed:  See the new instructions.   olmesartan 20 MG tablet Commonly known as:  BENICAR Take 1 tablet (20 mg total) by mouth daily.   pantoprazole 40 MG tablet Commonly known as:  PROTONIX Take 1 tablet (40 mg total) by mouth as needed (for stomach issues). What changed:  when to take this   Mitchell Take 1 tablet by mouth daily. Reported on 07/01/2015            Discharge Care Instructions        Start     Ordered   10/06/16 0000  aspirin 325 MG tablet  Daily     10/05/16 1221   10/06/16 0000  clopidogrel (PLAVIX) 75 MG tablet  Daily     10/05/16 1221   10/05/16 0000  pantoprazole (PROTONIX) 40 MG tablet  As needed     10/05/16 1221   10/05/16 0000  Diet - low sodium heart healthy     10/05/16 1221   10/05/16 0000  Discharge instructions    Comments:  PLEASE FOLLOW UP WITH PCP in one week.   10/05/16 1221     Follow-up Information    Deland Pretty, MD. Schedule an appointment as soon as possible for a visit in 1 week(s).   Specialty:  Internal Medicine Contact information: [place] [address] [phone]          Allergies  Allergen Reactions  . Sulfa Antibiotics     From childhood; patient doesn't recall reaction  . Sulfur     From childhood; patient doesn't recall reaction    Consultations:  Orthopedics  Neurology.    Procedures/Studies: Ct Angio Head W Or Wo Contrast  Result Date: 10/02/2016 CLINICAL DATA:  83 year old female with recent syncope and falls. Acute right PCA infarct on brain MRI today. 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 EXAM: CT ANGIOGRAPHY HEAD AND NECK TECHNIQUE:  Multidetector CT imaging of the head and neck was performed using the standard protocol during bolus administration of intravenous contrast. Multiplanar CT image reconstructions and MIPs were obtained to evaluate the vascular anatomy. Carotid stenosis measurements (when applicable) are obtained utilizing NASCET criteria, using the distal internal carotid diameter as the denominator. CONTRAST:  50 mL Isovue 370 COMPARISON:  Brain MRI 0213 hours today. CT head and cervical spine 10/01/2016 and earlier. FINDINGS: CT HEAD Brain: Cytotoxic edema in the right thalamus and occipital lobe as expected given the diffusion abnormality on MRI earlier today. No associated hemorrhage or mass effect. Elsewhere stable bilateral confluent white matter T2 and FLAIR hyperintensity. Stable ventricle  size and configuration. No acute intracranial hemorrhage identified. Calvarium and skull base: Stable and negative. Paranasal sinuses: Visualized paranasal sinuses and mastoids are stable and well pneumatized. Orbits: Stable orbit and scalp soft tissues. CTA NECK Skeleton: No acute osseous abnormality identified. Upper chest: Negative lung apices. No superior mediastinal lymphadenopathy. Other neck: Negative.  No lymphadenopathy. Aortic arch: Ectatic aortic arch with a mildly kinked appearance. No arch atherosclerosis. No great vessel origin stenosis. Right carotid system: Normal aside from ectasia and minimal calcified plaque at the anterior right ICA bulb. Left carotid system: Normal aside from ectasia. Vertebral arteries:No proximal subclavian artery plaque or stenosis. Normal vertebral artery origins. Fairly codominant vertebral arteries are patent to the skullbase without stenosis. CTA HEAD Posterior circulation: Normal distal vertebral arteries. Normal PICA origins and vertebrobasilar junction. Normal basilar artery. Normal SCA origins. The left PCA origin is patent. The left P1 segment is tortuous there is probably a diminutive left  posterior communicating artery. There is a severe stenosis of the left PCA distal P1 are proximal to or proximal P2 segment with preserved distal enhancement (series 15 image 19). On the right side a fetal type right PCA is suggested, but there is evidence of superimposed right P1 segment thrombosis just beyond its origin (series 13, image 113). The right P2 segment is patent, but there is a moderate to severe stenosis at the right P3 branching (series 15, image 19). There is preserved but attenuated distal right PCA enhancement. Anterior circulation: Both ICA siphons are patent and normal aside from minimal calcified plaque, greater on the left. Normal ophthalmic and posterior communicating artery origins. Normal carotid termini, MCA and ACA origins. Anterior communicating artery and proximal ACA branches are within normal limits. There is mild to moderate left greater than right distal ACA irregularity and stenosis, maximal in the pericallosal artery region (series 21, image 15). Left MCA M1 segment and bifurcation are normal. Right MCA M1 segment and bifurcation are normal. There is minimal bilateral distal MCA branch irregularity. Venous sinuses: Patent. Anatomic variants: Possible fetal type right PCA origin, uncertain. Delayed phase: No abnormal enhancement identified. Review of the MIP images confirms the above findings IMPRESSION: 1. Positive for a right P1 segment occlusion, but reconstituted right PCA flow via the right posterior communicating artery. Superimposed severe bilateral PCA stenosis, and on the right attenuated P3 branch enhancement. 2. Normal vertebral and basilar arteries. No significant carotid atherosclerosis, there is bilateral carotid artery ectasia. 3. Distal bilateral ACA atherosclerosis and stenosis. MCA branches are normal for age. 4. Expected evolution of the right PCA infarct with cytotoxic edema in the right thalamus and occipital lobe but no hemorrhage or mass effect. 5. No new  intracranial abnormality. 6. Ectatic aortic arch. Electronically Signed   By: Genevie Ann M.D.   On: 10/02/2016 14:14   Dg Chest 2 View  Result Date: 10/01/2016 CLINICAL DATA:  Fall.  Cough. EXAM: CHEST  2 VIEW COMPARISON:  07/25/2016. FINDINGS: Normal heart size. No pleural effusion or edema. The lungs are hyperinflated but clear. No airspace opacities identified. The bones appear diffusely osteopenic. IMPRESSION: 1. Lungs appear hyperinflated but clear. No posttraumatic abnormalities identified. Electronically Signed   By: Kerby Moors M.D.   On: 10/01/2016 11:44   Dg Pelvis 1-2 Views  Result Date: 10/01/2016 CLINICAL DATA:  Pain and swelling secondary to a fall today. EXAM: PELVIS - 1-2 VIEW COMPARISON:  05/08/2015 FINDINGS: There is no acute fracture. There is an old deformity of the left pubic body and left inferior  and superior pubic rami with healed fractures. There is an old healed intertrochanteric fracture of the proximal left femur. Screw and intramedullary nail in place in the proximal left femur. IMPRESSION: No acute abnormalities. Electronically Signed   By: Lorriane Shire M.D.   On: 10/01/2016 11:54   Dg Forearm Left  Result Date: 10/01/2016 CLINICAL DATA:  Left arm bruising secondary to a fall today. EXAM: LEFT FOREARM - 2 VIEW COMPARISON:  Radiographs dated 06/19/2004 FINDINGS: There is no acute fracture or dislocation or joint effusion. Previous open reduction and internal fixation of olecranon fracture which is now completely healed. K-wire and cerclage wire in place. IMPRESSION: No acute abnormalities. Electronically Signed   By: Lorriane Shire M.D.   On: 10/01/2016 11:53   Dg Tibia/fibula Left  Result Date: 10/01/2016 CLINICAL DATA:  Leg pain secondary to a fall today. EXAM: LEFT TIBIA AND FIBULA - 2 VIEW COMPARISON:  None. FINDINGS: There is no evidence of fracture or other focal bone lesions. Prominent soft tissue swelling anterior to the patella. IMPRESSION: No acute bone  abnormality. Soft tissue swelling anterior to the patella. Electronically Signed   By: Lorriane Shire M.D.   On: 10/01/2016 11:50   Ct Head Wo Contrast  Result Date: 10/01/2016 CLINICAL DATA:  Multiple falls and abrasions. Back pain. Initial encounter. EXAM: CT HEAD WITHOUT CONTRAST CT CERVICAL SPINE WITHOUT CONTRAST TECHNIQUE: Multidetector CT imaging of the head and cervical spine was performed following the standard protocol without intravenous contrast. Multiplanar CT image reconstructions of the cervical spine were also generated. COMPARISON:  Head CT 07/24/2016 FINDINGS: CT HEAD FINDINGS Brain: No evidence of acute infarction, hemorrhage, hydrocephalus, or extra-axial collection. There is cerebral atrophy, prominent in the medial temporal lobes, a pattern that can be seen with Alzheimer's disease. Chronic small vessel ischemia with extensive cerebral white matter low-density. There is an isodense mass on the midline anterior cranial fossa floor best seen on reformats and measures 9 x 16 mm. There is also a calcified extra-axial mass along the left middle cranial fossa floor measuring 12 mm. No associated significant mass effect or brain edema. Vascular: Atherosclerotic calcification. Skull: Negative for fracture Sinuses/Orbits: No evidence of injury. Bilateral cataract resection. CT CERVICAL SPINE FINDINGS Alignment: No traumatic malalignment. Skull base and vertebrae: Negative for fracture. Soft tissues and spinal canal: No prevertebral fluid or swelling. No visible canal hematoma. Disc levels: C4-5, C5-6, and C6-7 advanced disc degeneration. No evidence of cord impingement. Upper chest: No evidence of injury. IMPRESSION: 1. No evidence of intracranial or cervical spine injury. 2. Atrophy and extensive chronic small vessel ischemia. 3. Known small meningiomas in the anterior and left middle cranial fossae. No significant mass effect or brain edema. Electronically Signed   By: Monte Fantasia M.D.   On:  10/01/2016 10:59   Ct Angio Neck W Or Wo Contrast  Result Date: 10/02/2016 CLINICAL DATA:  83 year old female with recent syncope and falls. Acute right PCA infarct on brain MRI today. 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 EXAM: CT ANGIOGRAPHY HEAD AND NECK TECHNIQUE: Multidetector CT imaging of the head and neck was performed using the standard protocol during bolus administration of intravenous contrast. Multiplanar CT image reconstructions and MIPs were obtained to evaluate the vascular anatomy. Carotid stenosis measurements (when applicable) are obtained utilizing NASCET criteria, using the distal internal carotid diameter as the denominator. CONTRAST:  50 mL Isovue 370 COMPARISON:  Brain MRI 0213 hours today. CT head and cervical spine 10/01/2016 and earlier. FINDINGS: CT  HEAD Brain: Cytotoxic edema in the right thalamus and occipital lobe as expected given the diffusion abnormality on MRI earlier today. No associated hemorrhage or mass effect. Elsewhere stable bilateral confluent white matter T2 and FLAIR hyperintensity. Stable ventricle size and configuration. No acute intracranial hemorrhage identified. Calvarium and skull base: Stable and negative. Paranasal sinuses: Visualized paranasal sinuses and mastoids are stable and well pneumatized. Orbits: Stable orbit and scalp soft tissues. CTA NECK Skeleton: No acute osseous abnormality identified. Upper chest: Negative lung apices. No superior mediastinal lymphadenopathy. Other neck: Negative.  No lymphadenopathy. Aortic arch: Ectatic aortic arch with a mildly kinked appearance. No arch atherosclerosis. No great vessel origin stenosis. Right carotid system: Normal aside from ectasia and minimal calcified plaque at the anterior right ICA bulb. Left carotid system: Normal aside from ectasia. Vertebral arteries:No proximal subclavian artery plaque or stenosis. Normal vertebral artery origins. Fairly codominant vertebral arteries  are patent to the skullbase without stenosis. CTA HEAD Posterior circulation: Normal distal vertebral arteries. Normal PICA origins and vertebrobasilar junction. Normal basilar artery. Normal SCA origins. The left PCA origin is patent. The left P1 segment is tortuous there is probably a diminutive left posterior communicating artery. There is a severe stenosis of the left PCA distal P1 are proximal to or proximal P2 segment with preserved distal enhancement (series 15 image 19). On the right side a fetal type right PCA is suggested, but there is evidence of superimposed right P1 segment thrombosis just beyond its origin (series 13, image 113). The right P2 segment is patent, but there is a moderate to severe stenosis at the right P3 branching (series 15, image 19). There is preserved but attenuated distal right PCA enhancement. Anterior circulation: Both ICA siphons are patent and normal aside from minimal calcified plaque, greater on the left. Normal ophthalmic and posterior communicating artery origins. Normal carotid termini, MCA and ACA origins. Anterior communicating artery and proximal ACA branches are within normal limits. There is mild to moderate left greater than right distal ACA irregularity and stenosis, maximal in the pericallosal artery region (series 21, image 15). Left MCA M1 segment and bifurcation are normal. Right MCA M1 segment and bifurcation are normal. There is minimal bilateral distal MCA branch irregularity. Venous sinuses: Patent. Anatomic variants: Possible fetal type right PCA origin, uncertain. Delayed phase: No abnormal enhancement identified. Review of the MIP images confirms the above findings IMPRESSION: 1. Positive for a right P1 segment occlusion, but reconstituted right PCA flow via the right posterior communicating artery. Superimposed severe bilateral PCA stenosis, and on the right attenuated P3 branch enhancement. 2. Normal vertebral and basilar arteries. No significant carotid  atherosclerosis, there is bilateral carotid artery ectasia. 3. Distal bilateral ACA atherosclerosis and stenosis. MCA branches are normal for age. 4. Expected evolution of the right PCA infarct with cytotoxic edema in the right thalamus and occipital lobe but no hemorrhage or mass effect. 5. No new intracranial abnormality. 6. Ectatic aortic arch. Electronically Signed   By: Genevie Ann M.D.   On: 10/02/2016 14:14   Ct Cervical Spine Wo Contrast  Result Date: 10/01/2016 CLINICAL DATA:  Multiple falls and abrasions. Back pain. Initial encounter. EXAM: CT HEAD WITHOUT CONTRAST CT CERVICAL SPINE WITHOUT CONTRAST TECHNIQUE: Multidetector CT imaging of the head and cervical spine was performed following the standard protocol without intravenous contrast. Multiplanar CT image reconstructions of the cervical spine were also generated. COMPARISON:  Head CT 07/24/2016 FINDINGS: CT HEAD FINDINGS Brain: No evidence of acute infarction, hemorrhage, hydrocephalus, or extra-axial collection.  There is cerebral atrophy, prominent in the medial temporal lobes, a pattern that can be seen with Alzheimer's disease. Chronic small vessel ischemia with extensive cerebral white matter low-density. There is an isodense mass on the midline anterior cranial fossa floor best seen on reformats and measures 9 x 16 mm. There is also a calcified extra-axial mass along the left middle cranial fossa floor measuring 12 mm. No associated significant mass effect or brain edema. Vascular: Atherosclerotic calcification. Skull: Negative for fracture Sinuses/Orbits: No evidence of injury. Bilateral cataract resection. CT CERVICAL SPINE FINDINGS Alignment: No traumatic malalignment. Skull base and vertebrae: Negative for fracture. Soft tissues and spinal canal: No prevertebral fluid or swelling. No visible canal hematoma. Disc levels: C4-5, C5-6, and C6-7 advanced disc degeneration. No evidence of cord impingement. Upper chest: No evidence of injury.  IMPRESSION: 1. No evidence of intracranial or cervical spine injury. 2. Atrophy and extensive chronic small vessel ischemia. 3. Known small meningiomas in the anterior and left middle cranial fossae. No significant mass effect or brain edema. Electronically Signed   By: Monte Fantasia M.D.   On: 10/01/2016 10:59   Ct Lumbar Spine Wo Contrast  Result Date: 10/01/2016 CLINICAL DATA:  Low back pain.  Multiple falls. Initial encounter. EXAM: CT LUMBAR SPINE WITHOUT CONTRAST TECHNIQUE: Multidetector CT imaging of the lumbar spine was performed without intravenous contrast administration. Multiplanar CT image reconstructions were also generated. COMPARISON:  05/17/2014 lumbar spine MRI FINDINGS: Segmentation: Standard Alignment: Moderate levoscoliosis centered at L2. Vertebrae: Negative for acute fracture. Remote L1 superior endplate and L2 compression fractures. Height loss is moderate and retropulsion is mild at L2. Remote left twelfth rib and L1/L2 left transverse process fractures. Bilateral sacroiliac osteoarthritis with vacuum phenomenon. Paraspinal and other soft tissues: No acute finding. Partly seen bladder is moderately distended. Extensive sigmoid diverticulosis. Disc levels: T12- L1: No evidence of impingement L1-L2: Disc degeneration with narrowing and bulging. Negative facets. Mild bilateral subarticular recess and spinal stenosis due to prior retropulsion. L2-L3: Disc degeneration with vacuum phenomenon throughout degenerative disc clefts. The disc is narrowed and bulging with inferior foraminal narrowing that is noncompressive. Patent spinal canal. L3-L4: Degenerative disc narrowing and circumferential bulging. Inferior foraminal narrowing without compression. L4-L5: Disc narrowing and bulging. Asymmetric left degenerative facet spurring and ligamentum flavum thickening. Mild left subarticular recess narrowing. Mild left foraminal narrowing. L5-S1:Chronic pars defect on the right. Mild disc narrowing  and desiccation. Left foraminal disc bulge with L5 impingement. Patent spinal canal. IMPRESSION: 1. No acute finding. 2. Remote L1 and L2 compression fractures. 3. Scoliosis and spinal degeneration. Notable impingement at the left L5 S1 foramen due to disc bulge. 4. L5 chronic right pars defect. Electronically Signed   By: Monte Fantasia M.D.   On: 10/01/2016 11:08   Ct Knee Left Wo Contrast  Result Date: 10/01/2016 CLINICAL DATA:  Left knee pain after fall. EXAM: CT OF THE LEFT KNEE WITHOUT CONTRAST TECHNIQUE: Multidetector CT imaging of the left knee was performed according to the standard protocol. Multiplanar CT image reconstructions were also generated. COMPARISON:  Left knee x-rays from same day. FINDINGS: Bones/Joint/Cartilage No acute fracture or malalignment. Partially visualized distal femoral intramedullary rod. Mild tricompartmental degenerative changes of the knee. Chondrocalcinosis. No joint effusion. Ligaments Suboptimally assessed by CT. Muscles and Tendons No focal abnormality. Soft tissues Large hyperdense fluid collection over the anterior knee, more prominent on the medial aspect. The collection measures approximately 3.4 x 7.9 x 10.3 cm (AP by transverse by CC). There is prominent soft tissue  swelling about the medial and lateral knee. IMPRESSION: 1. Large hyperdense fluid collection over the anterior knee, more prominent on the medial aspect, consistent with hematoma. Findings likely represent hemorrhagic prepatellar bursitis. 2. No acute osseous abnormality of the knee. Mild tricompartmental degenerative changes. Electronically Signed   By: Titus Dubin M.D.   On: 10/01/2016 16:29   Mr Brain Wo Contrast  Addendum Date: 10/02/2016   ADDENDUM REPORT: 10/02/2016 03:08 ADDENDUM: Stable right paramedian frontal and left middle cranial fossa meningioma (series 10, image 17 and 22). Electronically Signed   By: Kristine Garbe M.D.   On: 10/02/2016 03:08   Result Date:  10/02/2016 CLINICAL DATA:  83 y/o  F; recurrent syncope. EXAM: MRI HEAD WITHOUT CONTRAST TECHNIQUE: Multiplanar, multiecho pulse sequences of the brain and surrounding structures were obtained without intravenous contrast. COMPARISON:  07/25/2016 MRI of the head.  10/01/2016 CT of the head. FINDINGS: Brain: Reduced diffusion within the right occipital lobe and the right lateral thalamus/ posterior limb of internal capsule compatible with acute/early subacute infarction. Additionally there is faint reduced diffusion in the right thalamus which may also represent infarct or secondary seizure activity. Areas of infarction are associated with mild T2 hyperintense signal abnormality. There is no mass effect or hemorrhage. Stable background of advanced chronic microvascular ischemic changes and parenchymal volume loss of the brain. No hydrocephalus, effacement of basilar cisterns, focal mass effect, or herniation. Vascular: Normal flow voids. Skull and upper cervical spine: Heterogeneous bone marrow signal likely related to history of polycythemia vera and thrombocytosis. Sinuses/Orbits: Bilateral intra-ocular lens replacement. Mild mucosal thickening of ethmoid air cells. No abnormal signal of mastoid air cells. Other: None. IMPRESSION: 1. Acute/early subacute infarction involving the right occipital lobe and separately the right lateral thalamus/posterior limb of internal capsule. Reduced diffusion in right hippocampus may represent infarction or secondary seizure activity. 2. No acute hemorrhage or significant mass effect. 3. Stable background of advanced chronic microvascular ischemic changes and parenchymal volume loss of the brain. These results will be called to the ordering clinician or representative by the Radiologist Assistant, and communication documented in the PACS or zVision Dashboard. Electronically Signed: By: Kristine Garbe M.D. On: 10/02/2016 02:59   Dg Knee Complete 4 Views Left  Result  Date: 10/01/2016 CLINICAL DATA:  Knee pain secondary to a fall today. EXAM: LEFT KNEE - COMPLETE 4+ VIEW COMPARISON:  05/08/2015 FINDINGS: There is no fracture or dislocation. There is prominent soft tissue swelling anterior to the patella. Diffuse osteopenia. Chondrocalcinosis. Tiny marginal osteophytes. IMPRESSION: No acute bone abnormality.  Arthritic changes as described. Prominent soft tissue swelling anterior to the patella. Electronically Signed   By: Lorriane Shire M.D.   On: 10/01/2016 11:49   Dg Humerus Left  Result Date: 10/01/2016 CLINICAL DATA:  Left arm pain secondary to a fall today. EXAM: LEFT HUMERUS - 2+ VIEW COMPARISON:  None. FINDINGS: There is no evidence of fracture or other focal bone lesions. Soft tissues are unremarkable. IMPRESSION: Normal left humerus. Electronically Signed   By: Lorriane Shire M.D.   On: 10/01/2016 11:51   Dg Femur Min 2 Views Left  Result Date: 10/01/2016 CLINICAL DATA:  Left hip pain secondary to a fall today. EXAM: LEFT FEMUR 2 VIEWS COMPARISON:  Radiographs dated 05/08/2015 FINDINGS: There is no acute fracture. No dislocation. Old healed intertrochanteric fracture of the proximal left femur. Old pubic fractures. Intramedullary nail and screws in place in the femur. Marked anterior soft tissue swelling over the patella. IMPRESSION: No acute abnormality of  the left femur. Soft tissue swelling over the patella. Electronically Signed   By: Lorriane Shire M.D.   On: 10/01/2016 11:47       Subjective: No new complaints.   Discharge Exam: Vitals:   10/04/16 2137 10/05/16 0424  BP: (!) 147/64 (!) 157/57  Pulse: 74 69  Resp: 15 18  Temp: 98 F (36.7 C) 97.8 F (36.6 C)  SpO2: 96% 96%   Vitals:   10/04/16 0500 10/04/16 1415 10/04/16 2137 10/05/16 0424  BP: (!) 184/66 (!) 158/64 (!) 147/64 (!) 157/57  Pulse: 60 91 74 69  Resp: 16 16 15 18   Temp: 98.2 F (36.8 C) 98.2 F (36.8 C) 98 F (36.7 C) 97.8 F (36.6 C)  TempSrc: Axillary Oral Oral  Oral  SpO2: 98% 96% 96% 96%    General: Pt is alert, awake, not in acute distress Cardiovascular: RRR, S1/S2 +, no rubs, no gallops Respiratory: CTA bilaterally, no wheezing, no rhonchi Abdominal: Soft, NT, ND, bowel sounds + Extremities: LEFT KNEE HEMATOMA IN ACE BANDAGE.     The results of significant diagnostics from this hospitalization (including imaging, microbiology, ancillary and laboratory) are listed below for reference.     Microbiology: No results found for this or any previous visit (from the past 240 hour(s)).   Labs: BNP (last 3 results)  Recent Labs  07/25/16 0505  BNP 440.1*   Basic Metabolic Panel:  Recent Labs Lab 10/01/16 1025 10/02/16 1143  NA 139 135  K 4.2 4.3  CL 109 106  CO2 22 21*  GLUCOSE 114* 108*  BUN 14 14  CREATININE 1.06* 0.95  CALCIUM 8.3* 7.9*   Liver Function Tests:  Recent Labs Lab 10/01/16 1025  AST 20  ALT 10*  ALKPHOS 61  BILITOT 0.8  PROT 5.7*  ALBUMIN 3.3*   No results for input(s): LIPASE, AMYLASE in the last 168 hours. No results for input(s): AMMONIA in the last 168 hours. CBC:  Recent Labs Lab 10/01/16 1025 10/02/16 1143 10/04/16 0236  WBC 15.1* 18.8* 14.3*  NEUTROABS 13.4*  --   --   HGB 11.2* 10.8* 9.5*  HCT 38.8 37.6 33.2*  MCV 63.0* 62.5* 62.4*  PLT 382 435* 528*   Cardiac Enzymes: No results for input(s): CKTOTAL, CKMB, CKMBINDEX, TROPONINI in the last 168 hours. BNP: Invalid input(s): POCBNP CBG: No results for input(s): GLUCAP in the last 168 hours. D-Dimer No results for input(s): DDIMER in the last 72 hours. Hgb A1c No results for input(s): HGBA1C in the last 72 hours. Lipid Profile  Recent Labs  10/03/16 0412  CHOL 117  HDL 38*  LDLCALC 65  TRIG 72  CHOLHDL 3.1   Thyroid function studies No results for input(s): TSH, T4TOTAL, T3FREE, THYROIDAB in the last 72 hours.  Invalid input(s): FREET3 Anemia work up No results for input(s): VITAMINB12, FOLATE, FERRITIN, TIBC,  IRON, RETICCTPCT in the last 72 hours. Urinalysis    Component Value Date/Time   COLORURINE YELLOW 10/01/2016 1035   APPEARANCEUR CLEAR 10/01/2016 1035   LABSPEC 1.009 10/01/2016 1035   PHURINE 5.0 10/01/2016 1035   GLUCOSEU NEGATIVE 10/01/2016 1035   HGBUR NEGATIVE 10/01/2016 1035   BILIRUBINUR NEGATIVE 10/01/2016 1035   KETONESUR NEGATIVE 10/01/2016 1035   PROTEINUR NEGATIVE 10/01/2016 1035   UROBILINOGEN 0.2 08/27/2006 2231   NITRITE NEGATIVE 10/01/2016 1035   LEUKOCYTESUR TRACE (A) 10/01/2016 1035   Sepsis Labs Invalid input(s): PROCALCITONIN,  WBC,  LACTICIDVEN Microbiology No results found for this or any previous visit (from  the past 240 hour(s)).   Time coordinating discharge: Over 30 minutes  SIGNED:   Hosie Poisson, MD  [facility] 10/05/2016, 12:22 PM Pager   If 7PM-7AM, please contact night-coverage [email or web address] Password [facility]  ADDENDUM 10/06/2016: I have seen and examined Ms. Cressler. There have been no changes in clinical status since plan for discharge yesterday. She does not meet criteria for inpatient rehabilitation and has a bed offer at [facility] for SNF. She will receive ongoing PT, OT, SLP in addition to the recommendations as stated in the discharge summary as filed yesterday, and copied above with my addenda, by Dr. Karleen Hampshire.   I've discussed this in length with the patient, her Son at the bedside and her Daughter by phone for approximately 30 minutes. I've discussed her climbing platelet count with her hematologist, Dr. Burr Medico, who recommends hydrea daily for 4 days, then return to 500mg  every other day. All questions were answered and all concerns addressed. Plan for discharge today.   Vance Gather, MD 10/06/2016 10:56 AM

## 2016-10-07 ENCOUNTER — Telehealth: Payer: Self-pay | Admitting: Hematology

## 2016-10-07 NOTE — Telephone Encounter (Signed)
Spoke with patient's son regarding he cancellation and additions to her schedule in September. Patient had a stroke and is currently in a facility. He is going to call and see what they have in regards to transportation help.

## 2016-10-08 ENCOUNTER — Other Ambulatory Visit: Payer: Medicare Other

## 2016-10-14 ENCOUNTER — Other Ambulatory Visit: Payer: Medicare Other

## 2016-10-15 ENCOUNTER — Other Ambulatory Visit: Payer: Medicare Other

## 2016-10-15 ENCOUNTER — Ambulatory Visit: Payer: Medicare Other | Admitting: Hematology

## 2016-10-15 NOTE — Progress Notes (Signed)
[facility] ONCOLOGY OFFICE PROGRESS NOTE DATE OF VISIT: 10/18/2016   Deland Pretty, MD [address] [place] [address]  DIAGNOSIS: Polycythemia Vera, Breast Cancer   PREVIOUS AND CURRENT THERAPY:  1. Phlebotomy if hematocrit is greater than or equal to 42% (changed to 45% in May 2016). The patient receives 300-400 mL of normal saline following each 2/1-1-unit phlebotomy  2. Anagrelide 0.5 mg twice daily and aspirin 81 mg daily, anagrelide increased to 1.0mg  AM and 0.5mg  PM since Feb 2016, and 1mg  twice daily on 12/03/2015, increased to 1.5mg  bid in 02/2016. Increased to 2mg  BID in 03/2016. Switched to Hydrea 500 mg every other day on 09/30/16 3. She previously tried Hydrea 500mg  daily, developed thrombocytopenia, was subsequently held. Restarted hydrea 500 mg every other day on 10/01/16  INTERVAL HISTORY:  [name] 83 y.o. female with a history of polycythemia vera (04/21/2007) with positive JAK2 mutation is here for follow-up.  She is accompanied by her daughter and in her mechanical chair.  Her leg and toes are were very swollen from wrapping and has come down significantly. She has bleeding, hematoma, from the fall in her knee. They are going to the orthopedist on Friday. She is gaining strength in her arms R>L. Her vision is still challenged and she has increased muscle strength in her muscle and head. They want to continue PT.  She has an XRAY and doppler for her knee scheduled but not sure if they will make that appointment today She tries to do more at [place] to be active. She previously had her esophagus stretch due to trouble swallowing. However when in bed she has more trouble.    MEDICAL HISTORY: Past Medical History:  Diagnosis Date  . Acute blood loss anemia   . Acute encephalopathy   . Altered mental state   . Arthritis   . Atrophic gastritis   . Breast cancer ([place])    right  . Candida esophagitis ([place])   . Chest tightness   .  Chronic gastritis   . Closed hip fracture ([place])   . Closed intertrochanteric fracture of left femur with routine healing   . Diverticulosis   . Hypertension   . IBS (irritable bowel syndrome)   . Mitral valve prolapse   . MVP (mitral valve prolapse)   . Osteoporosis   . Palpitation   . Polycythemia   . PVC's (premature ventricular contractions)   . Rhinitis   . Seizures ([place])   . Unsteady gait     INTERIM HISTORY: has Malignant neoplasm of female breast ([place]); Polycythemia vera ([place]); Atrophic gastritis; DIVERTICULOSIS OF COLON; ARTHRITIS; ABDOMINAL PAIN, EPIGASTRIC; HTN (hypertension); Mitral valve prolapse; Closed left hip fracture ([place]); Leukocytosis; Fracture, intertrochanteric, left femur ([place]); Faintness; Acute blood loss anemia; Thrombocytosis ([place]); Dehydration; Osteoporosis; History of breast cancer in female; Foot swelling; Hypertensive urgency; Acute encephalopathy; Altered mental status; Protein-calorie malnutrition, severe; Seizure ([place]); Fever; Memory loss; Fall; and Cerebral thrombosis with cerebral infarction on her problem list.    ALLERGIES:  is allergic to sulfa antibiotics and sulfur.  MEDICATIONS: has a current medication list which includes the following prescription(s): amlodipine, anagrelide, aspirin, calcium carbonate-vitamin d, cetirizine hcl, vitamin d3, colestipol, fluticasone, hydrochlorothiazide, lisinopril, loperamide, metoprolol succinate, multivitamin, pantoprazole, potassium chloride, and probiotic product.  SURGICAL HISTORY:  Past Surgical History:  Procedure Laterality Date  . ABDOMINAL HYSTERECTOMY     ovaries taken  . APPENDECTOMY    . BREAST LUMPECTOMY     right  .  CARDIOVASCULAR STRESS TEST  12/23/2000   EF 70%  . CATARACT EXTRACTION     bilateral  . ELBOW FRACTURE SURGERY     left  . FEMUR IM NAIL Left 05/08/2015   Procedure: INTERTROCHANTERIC FEMORAL NAIL;  Surgeon: Rod Can, MD;  Location: [facility]S;  Service: Orthopedics;  Laterality:  Left;  . HEMATOMA EVACUATION     right  . TONSILLECTOMY    . TRANSTHORACIC ECHOCARDIOGRAM  03/05/2010   EF 55-60%   PROBLEM LIST:  1. Polycythemia vera diagnosed in March 2009. JAK2 mutation was detected on 04/21/2007. Hemoglobin in April 2009 was 18.4 with hematocrit of 55.0. Platelet count on July 12, 2007, was 868,000. The patient has never had a bone marrow exam. She had been undergoing phlebotomies in the past, approximately once a month, for hematocrit greater than or equal to 45%. The patient has been on anagrelide 0.5 mg twice daily, as well as aspirin. She has not had any thrombotic complications. We will be carrying out a 1-unit phlebotomy whenever the hematocrit is greater than or equal to 42%. The patient does require normal saline 300 mL after phlebotomy because of symptomatic hypotension.  2. 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  Lumpectomy on 01/04/2006 followed by radiation treatments under the direction of Dr. Gery Pray. The patient has not required any systemic therapy. She does have yearly mammograms.  3. Mitral valve prolapse.  4. Hypertension.  5. Diverticulosis.  6. History of esophageal stricture.  7. History of gastritis.  8. History of pelvic fractures after falling on August 23, 2006.  REVIEW OF SYSTEMS:   Constitutional: Denies fevers, chills or abnormal weight loss Eyes: Denies blurriness of vision Ears, nose, mouth, throat, and face: Denies mucositis or sore throat (+) trouble swallowing Respiratory: Denies cough, dyspnea or wheezes Cardiovascular: Denies palpitation, chest discomfort or lower extremity swelling Gastrointestinal:  Denies nausea, heartburn or change in bowel habits Skin: Denies abnormal skin rashes Lymphatics: Denies new lymphadenopathy or easy bruising Neurological:Denies numbness, tingling or new weaknesses MSK: (+) hematoma in knee (+) muscle weakness due to stroke Behavioral/Psych: Mood is stable, no new changes  All  other systems were reviewed with the patient and are negative.  PHYSICAL EXAMINATION: ECOG PERFORMANCE STATUS: 4  Blood pressure 128/64, pulse 81, temperature 97.8 F (36.6 C), temperature source Oral, resp. rate 18, height 5\' 2"  (1.575 m), SpO2 96 %.  GENERAL:alert, no distress and comfortable; she has scoliosis and looks her stated age.  SKIN: skin color, texture, turgor are normal, no rashes but seborrheic keratosis.  EYES: normal, Conjunctiva are pink and non-injected, sclera clear OROPHARYNX:no exudate, no erythema and lips, buccal mucosa, and tongue normal  NECK: supple, thyroid normal size, non-tender, without nodularity LYMPH:  no palpable lymphadenopathy in the cervical, axillary or supraclavicular LUNGS: clear to auscultation and percussion with normal breathing effort HEART: regular rate & rhythm and no murmurs and no lower extremity edema ABDOMEN:abdomen soft, non-tender and normal bowel sounds Musculoskeletal: No issues. NEURO: alert & oriented x 3 with fluent speech, no focal motor/sensory deficits Breasts: Breast inspection showed them to be symmetrical with no nipple discharge. Status post right lumpectomy change.  Palpation of the breasts and axilla revealed no obvious mass that I could appreciate.   LABORATORY DATA: CBC Latest Ref Rng & Units 10/18/2016 10/05/2016 10/04/2016  WBC 3.9 - 10.3 10e3/uL 11.7(H) 16.2(H) 14.3(H)  Hemoglobin 11.6 - 15.9 g/dL 12.5 10.5(L) 9.5(L)  Hematocrit 34.8 - 46.6 % 41.3 35.4(L) 33.2(L)  Platelets 145 -  400 10e3/uL 470(H) 848(H) 528(H)    CMP Latest Ref Rng & Units 10/18/2016 10/02/2016 10/01/2016  Glucose 70 - 140 mg/dl 108 108(H) 114(H)  BUN 7.0 - 26.0 mg/dL 22.6 14 14   Creatinine 0.6 - 1.1 mg/dL 0.9 0.95 1.06(H)  Sodium 136 - 145 mEq/L 134(L) 135 139  Potassium 3.5 - 5.1 mEq/L 4.4 4.3 4.2  Chloride 101 - 111 mmol/L - 106 109  CO2 22 - 29 mEq/L 23 21(L) 22  Calcium 8.4 - 10.4 mg/dL 9.2 7.9(L) 8.3(L)  Total Protein 6.4 - 8.3 g/dL 6.2(L) -  5.7(L)  Total Bilirubin 0.20 - 1.20 mg/dL 0.71 - 0.8  Alkaline Phos 40 - 150 U/L 78 - 61  AST 5 - 34 U/L 16 - 20  ALT 0-55 U/L U/L <6 - 10(L)    RADIOGRAPHY STUDIES    IMAGING STUDIES:  1. Ultrasound of the abdomen, limited, on 01/21/2006, showed a splenic volume of 73 cc with maximum ultrasound dimension of 8.8 cm. Spleen measured 8.8 x 4.1 x 3.9 cm, for a volume of 73 cc. No focal lesions were identified.  2. Diagnostic bilateral digital mammogram carried out at [facility] on 01/01/2011 showed no suspicious findings.  3. Mammogram, diagnostic, bilateral on 01/04/2012 showed no significant abnormalities. There were post lumpectomy changes present involving the right breast.   ASSESSMENT: Filbert Berthold Stoiber 83 y.o. female with a history of Polycythemia vera ([place])  History of breast cancer in female  PLAN:  1. Myeloproliferative neoplasm, Polycythemia vera, JAK2(+)  -She previously did not tolerate Hydrea due to thrombocytopenia. Recently changed from anagrelide to Hydrea, at the lower dose, 500 mg every other day, due to her concerns of cardiovascular side effects from anagrelide. -We previously discussed this is a incurable disease, most people have indolent and benign course, the major complication is thrombosis. Some patients may develop leukemia and myelofibrosis at the end. -She was admitted to hospital on 10/01/16 for a fall and X-ray showed left knee hematoma. Brain MRI showed Acute/early subacute infarction. She has residual left side weakness, and platelet counts was normal when she was admitted.  -Her PLT is 470 today and HCT is normal at 41.3. Her Hg is normal at 12.5. ANC is 10.2.  -Hydrea is working well at this dose of every other day.  -She will continue to get blood work at [facility] every 2 week sas she continues to recover there. If she gets discharged from [facility], I encourage her to return here for lab f/u  -f/u in 2 months    2. History of right breast DCIS   --Involving the right breast with biopsy on 11/29/2005.  Lumpectomy on 01/04/2006 followed by radiation treatments under the direction of Dr. Gery Pray.  -She is still on annual screening mammogram surveillance, will continue.   3. L2 compression fracture in 04/2014, left femoral fracture in April 2017, osteoporosis -Her pain and the morbility has improved much after rehab  -Continue supportive care and symptom management by her primary care physician -She is on Prolia with her PCP, I encourage her to continue calcium and vitamin D 2 tablets a day   4. Stroke in 09/2016 -She still has left-sided weakness, but it is improving -She has not been able to participate intensive physical therapy much due to her left knee hematoma -I again encouraged her to continue physical therapy  5. Fall and left knee hematoma  -she will f/u with her orthopedics   Plan -Continue hydrea 500 mg every other day  -  No phlebotomy today. -She will have lab every 2 weeks at her rehab, she knows to call me after discharge from rehabilitation, then she will return here for lab -f/u in 2 months.  All questions were answered. The patient knows to call the clinic with any problems, questions or concerns. We can certainly see the patient much sooner if necessary.  I spent 20 minutes counseling the patient face to face. The total time spent in the appointment was 25 minutes.   Truitt Merle  10/18/2016   This document serves as a record of services personally performed by Truitt Merle, MD. It was created on her behalf by Joslyn Devon, a trained medical scribe. The creation of this record is based on the scribe's personal observations and the provider's statements to them. This document has been checked and approved by the attending provider.

## 2016-10-18 ENCOUNTER — Other Ambulatory Visit (HOSPITAL_BASED_OUTPATIENT_CLINIC_OR_DEPARTMENT_OTHER): Payer: Medicare Other

## 2016-10-18 ENCOUNTER — Ambulatory Visit (HOSPITAL_BASED_OUTPATIENT_CLINIC_OR_DEPARTMENT_OTHER): Payer: Medicare Other | Admitting: Hematology

## 2016-10-18 ENCOUNTER — Telehealth: Payer: Self-pay | Admitting: Hematology

## 2016-10-18 VITALS — BP 128/64 | HR 81 | Temp 97.8°F | Resp 18 | Ht 62.0 in

## 2016-10-18 DIAGNOSIS — D45 Polycythemia vera: Secondary | ICD-10-CM

## 2016-10-18 DIAGNOSIS — Z86000 Personal history of in-situ neoplasm of breast: Secondary | ICD-10-CM

## 2016-10-18 DIAGNOSIS — Z853 Personal history of malignant neoplasm of breast: Secondary | ICD-10-CM

## 2016-10-18 DIAGNOSIS — D500 Iron deficiency anemia secondary to blood loss (chronic): Secondary | ICD-10-CM

## 2016-10-18 DIAGNOSIS — Z8673 Personal history of transient ischemic attack (TIA), and cerebral infarction without residual deficits: Secondary | ICD-10-CM | POA: Diagnosis not present

## 2016-10-18 LAB — COMPREHENSIVE METABOLIC PANEL
ALBUMIN: 3.4 g/dL — AB (ref 3.5–5.0)
ALT: <6 U/L (ref 0–55)
AST: 16 U/L (ref 5–34)
Alkaline Phosphatase: 78 U/L (ref 40–150)
Anion Gap: 7 mEq/L (ref 3–11)
BUN: 22.6 mg/dL (ref 7.0–26.0)
CALCIUM: 9.2 mg/dL (ref 8.4–10.4)
CHLORIDE: 103 meq/L (ref 98–109)
CO2: 23 meq/L (ref 22–29)
CREATININE: 0.9 mg/dL (ref 0.6–1.1)
EGFR: 58 mL/min/{1.73_m2} — ABNORMAL LOW (ref >90)
Glucose: 108 mg/dl (ref 70–140)
Potassium: 4.4 mEq/L (ref 3.5–5.1)
SODIUM: 134 meq/L — AB (ref 136–145)
TOTAL PROTEIN: 6.2 g/dL — AB (ref 6.4–8.3)
Total Bilirubin: 0.71 mg/dL (ref 0.20–1.20)

## 2016-10-18 LAB — CBC WITH DIFFERENTIAL/PLATELET
BASO%: 0.5 % (ref 0.0–2.0)
Basophils Absolute: 0.1 10*3/uL (ref 0.0–0.1)
EOS ABS: 0.3 10*3/uL (ref 0.0–0.5)
EOS%: 2.3 % (ref 0.0–7.0)
HEMATOCRIT: 41.3 % (ref 34.8–46.6)
HGB: 12.5 g/dL (ref 11.6–15.9)
LYMPH#: 0.6 10*3/uL — AB (ref 0.9–3.3)
LYMPH%: 4.8 % — AB (ref 14.0–49.7)
MCH: 19.4 pg — ABNORMAL LOW (ref 25.1–34.0)
MCHC: 30.3 g/dL — ABNORMAL LOW (ref 31.5–36.0)
MCV: 64.2 fL — AB (ref 79.5–101.0)
MONO#: 0.6 10*3/uL (ref 0.1–0.9)
MONO%: 5.4 % (ref 0.0–14.0)
NEUT%: 87 % — AB (ref 38.4–76.8)
NEUTROS ABS: 10.2 10*3/uL — AB (ref 1.5–6.5)
PLATELETS: 470 10*3/uL — AB (ref 145–400)
RBC: 6.43 10*6/uL — AB (ref 3.70–5.45)
RDW: 24.5 % — ABNORMAL HIGH (ref 11.2–14.5)
WBC: 11.7 10*3/uL — ABNORMAL HIGH (ref 3.9–10.3)

## 2016-10-18 LAB — FERRITIN: Ferritin: 39 ng/ml (ref 9–269)

## 2016-10-18 NOTE — Telephone Encounter (Signed)
Gave patient/family avs report and appointments for November and December. [place] not complete for 9/17. Spoke with  YF and per YF cxd October appointments and left November and December as scheduled. Patient/family awaare.

## 2016-10-19 ENCOUNTER — Encounter: Payer: Self-pay | Admitting: Hematology

## 2016-10-20 ENCOUNTER — Other Ambulatory Visit: Payer: Medicare Other

## 2016-10-22 ENCOUNTER — Ambulatory Visit: Payer: Medicare Other | Admitting: Cardiology

## 2016-10-22 DIAGNOSIS — S8002XA Contusion of left knee, initial encounter: Secondary | ICD-10-CM | POA: Diagnosis not present

## 2016-10-26 ENCOUNTER — Telehealth: Payer: Self-pay | Admitting: Hematology

## 2016-10-26 NOTE — Telephone Encounter (Signed)
Left voicemail for patient regarding her appt. Sending her a confirmation letter.

## 2016-11-01 DIAGNOSIS — I10 Essential (primary) hypertension: Secondary | ICD-10-CM | POA: Diagnosis not present

## 2016-11-01 DIAGNOSIS — M1990 Unspecified osteoarthritis, unspecified site: Secondary | ICD-10-CM | POA: Diagnosis not present

## 2016-11-01 DIAGNOSIS — D45 Polycythemia vera: Secondary | ICD-10-CM | POA: Diagnosis not present

## 2016-11-01 DIAGNOSIS — M810 Age-related osteoporosis without current pathological fracture: Secondary | ICD-10-CM | POA: Diagnosis not present

## 2016-11-17 ENCOUNTER — Other Ambulatory Visit: Payer: Medicare Other

## 2016-11-22 ENCOUNTER — Ambulatory Visit (INDEPENDENT_AMBULATORY_CARE_PROVIDER_SITE_OTHER): Payer: Medicare Other | Admitting: Cardiovascular Disease

## 2016-11-22 ENCOUNTER — Encounter: Payer: Self-pay | Admitting: Cardiovascular Disease

## 2016-11-22 VITALS — BP 120/56 | HR 87 | Ht 62.0 in

## 2016-11-22 DIAGNOSIS — I341 Nonrheumatic mitral (valve) prolapse: Secondary | ICD-10-CM

## 2016-11-22 DIAGNOSIS — I10 Essential (primary) hypertension: Secondary | ICD-10-CM | POA: Diagnosis not present

## 2016-11-22 NOTE — Patient Instructions (Signed)

## 2016-11-22 NOTE — Progress Notes (Signed)
[name] Date of Birth  November 23, 1926        [address]    [place]     [place], [place]  [address]          History of Present Illness:  Problem list: 1. Mitral valve prolapse 2. Breast cancer 3. Polycythemia 4. Hypertension 5. Polycythemia   [name] is an 83 year old female with a history of mitral valve prolapse, hypertension, and polycythemia. She has phlebotomy  about once a month for her polycythemia. Her blood pressure typically is little bit elevated and then goes down quickly after her phlebotomy. She's feeling quite well. She's exercising regularly.  July 05, 2012:  No CP.  She has mild  Dyspnea.  She is still getting phlebotomy regularly.  She has some mild dyspnea when she first lies down but it typically resolves and she doe not have to sleep on any pillows.  She is able to do her normal activities without problems.   07/09/2013:  [name] is doing ok.  Not walking as much as she used to.  Stays active. h Has lots of cramps in her feet.   Dec. 7 ,2015:  [name] is an 83 yo who we follow for HTN and MVP.  She also has polycythemia and gets phlebotomy once a month.  Staying moderately active.   Gets fatigued easy She had labs a month ago  Dec. 6, 2016:   [name] is doing ok from a cardiac standpoint . Broke her back this past march .  No CP ,  Occasional dyspnea.  Has polycythemia vera.  Had a phlebotomy last week .   July 07, 2015:  Has had a hip fracture in April.  Has had it repaired.  Still walking with a walker  April 28, 2016:  Doing ok Has focal swelling of the right foot .  Has stopped her metoprolol  BP has been high. Has been eating lots of soup   August 19, 2016: [name] is seen back today for follow up visit  She was in the hospital in June with hypertensive urgency and mental status changes. She was found to be hyponatremic so her HCTZ was stopped and she was started on Lasix.    She went to [facility] and the diuretic was stopped .   Came home from [facility] a week ago .   Oct. 22, 2018:  [name] is seen today She is back at [facility].  BP has been well controlled.  Has had a CVA several months ago Is now on ASA 325 mg  Left sided weakness   Current Outpatient Prescriptions on File Prior to Visit  Medication Sig Dispense Refill  . aspirin 325 MG tablet Take 1 tablet (325 mg total) by mouth daily. 30 tablet 0  . Calcium Carbonate-Vitamin D (CALCIUM 500/D PO) Take 1 tablet by mouth daily.     . cetirizine (ZYRTEC) 10 MG tablet Take 10 mg by mouth daily as needed for allergies.    Marland Kitchen clopidogrel (PLAVIX) 75 MG tablet Take 1 tablet (75 mg total) by mouth daily. 30 tablet 0  . fluticasone (FLONASE) 50 MCG/ACT nasal spray Place 2 sprays into both nostrils as needed for allergies or rhinitis. Reported on 06/11/2015    . hydroxyurea (HYDREA) 500 MG capsule 500mg  po daily for 4 days, then every other day 20 capsule 0  . levETIRAcetam (KEPPRA) 500 MG tablet Take 1 tablet (500 mg total) by mouth 2 (two) times  daily. 180 tablet 4  . levothyroxine (SYNTHROID, LEVOTHROID) 25 MCG tablet Take 25 mcg by mouth daily before breakfast.     . metoprolol succinate (TOPROL-XL) 50 MG 24 hr tablet TAKE 1 TABLET ONCE DAILY WITH FOOD. (Patient taking differently: Take 50 mg by mouth once a day) 90 tablet 0  . olmesartan (BENICAR) 20 MG tablet Take 1 tablet (20 mg total) by mouth daily. 90 tablet 3  . pantoprazole (PROTONIX) 40 MG tablet Take 1 tablet (40 mg total) by mouth as needed (for stomach issues).    . polyethylene glycol (MIRALAX / GLYCOLAX) packet Take 17 g by mouth daily. 14 each 0  . Probiotic Product (ULTRAFLORA IMMUNE HEALTH PO) Take 1 tablet by mouth daily. Reported on 07/01/2015    . senna-docusate (SENOKOT-S) 8.6-50 MG tablet Take 1 tablet by mouth 2 (two) times daily. 30 tablet 0   No current facility-administered medications on file prior to visit.     Allergies  Allergen Reactions  . Sulfa Antibiotics     From  childhood; patient doesn't recall reaction  . Sulfur     From childhood; patient doesn't recall reaction    Past Medical History:  Diagnosis Date  . Acute blood loss anemia   . Acute encephalopathy   . Altered mental state   . Arthritis   . Atrophic gastritis   . Breast cancer ([place])    right  . Candida esophagitis ([place])   . Chest tightness   . Chronic gastritis   . Closed hip fracture ([place])   . Closed intertrochanteric fracture of left femur with routine healing   . Diverticulosis   . Hypertension   . IBS (irritable bowel syndrome)   . Mitral valve prolapse   . MVP (mitral valve prolapse)   . Osteoporosis   . Palpitation   . Polycythemia   . PVC's (premature ventricular contractions)   . Rhinitis   . Seizures ([place])   . Unsteady gait     Past Surgical History:  Procedure Laterality Date  . ABDOMINAL HYSTERECTOMY     ovaries taken  . APPENDECTOMY    . BREAST LUMPECTOMY     right  . CARDIOVASCULAR STRESS TEST  12/23/2000   EF 70%  . CATARACT EXTRACTION     bilateral  . ELBOW FRACTURE SURGERY     left  . FEMUR IM NAIL Left 05/08/2015   Procedure: INTERTROCHANTERIC FEMORAL NAIL;  Surgeon: Rod Can, MD;  Location: [facility];  Service: Orthopedics;  Laterality: Left;  . HEMATOMA EVACUATION     right  . TONSILLECTOMY    . TRANSTHORACIC ECHOCARDIOGRAM  03/05/2010   EF 55-60%    History  Smoking Status  . Former Smoker  . Quit date: 08/24/1980  Smokeless Tobacco  . Never Used    History  Alcohol Use  . Yes    Comment: occasional    Family History  Problem Relation Age of Onset  . Stomach cancer Mother   . Hypertension Father   . Stroke Father   . Prostate cancer Father   . Hypertension Sister   . Hypertension Brother   . Prostate cancer Brother   . Diabetes Brother   . Colon cancer Neg Hx     Reviw of Systems:  Reviewed in the HPI.  All other systems are negative.  Physical Exam: Blood pressure (!) 120/56, pulse 87, height 5\' 2"  (1.575 m),  SpO2 98 %.  GEN:  Frail, chronically ill-appearing elderly female examined in wheelchair. She  is very thin. HEENT: Normal NECK: No JVD; No carotid bruits LYMPHATICS: No lymphadenopathy CARDIAC: RR, soft systolic murmur. Distant heart sounds RESPIRATORY:  Clear to auscultation without rales, wheezing or rhonchi  ABDOMEN: She is extremely thin. MUSCULOSKELETAL:  No edema; No deformity  SKIN: Warm and dry NEUROLOGIC:  Very weak.  Left side - no fine motor skills  Examined in the wheel chair  ECG:     Assessment / Plan:   1.  Hypertension -   blood pressures well-controlled. Continue current medications  2. Recent stroke:  She has become quite debilitated secondary to her recent stroke. It is quite challenging to get her to the office now. We will see her on an as-needed basis.  2.  Mitral valve prolapse- stable   3.    Polycythemia - gets phlebotomized regularly       Mertie Moores, MD  11/22/2016 5:52 AM    [address] [address],  [place], [place]  [address] Pager [phone] Phone: [phone]; Fax: [phone]

## 2016-11-30 ENCOUNTER — Telehealth: Payer: Self-pay | Admitting: Diagnostic Neuroimaging

## 2016-11-30 NOTE — Telephone Encounter (Signed)
Please call pt's daughter Louanna Raw he mother was at [facility] in [place] for a stroke and no one has made any f/u apt through [facility] she does not want to wait until December for the first available that we have so she wants to see if Dr. Leta Baptist can work her mother in sooner. There is not any referral in epic but pt was seen by Dr. Leonie Man in the hosp but no referral was in epic.

## 2016-12-06 DIAGNOSIS — R110 Nausea: Secondary | ICD-10-CM | POA: Diagnosis not present

## 2016-12-06 DIAGNOSIS — R531 Weakness: Secondary | ICD-10-CM | POA: Diagnosis not present

## 2016-12-06 DIAGNOSIS — T148XXA Other injury of unspecified body region, initial encounter: Secondary | ICD-10-CM | POA: Diagnosis not present

## 2016-12-06 DIAGNOSIS — I639 Cerebral infarction, unspecified: Secondary | ICD-10-CM | POA: Diagnosis not present

## 2016-12-06 NOTE — Telephone Encounter (Signed)
Katrina,   Per Dr. Leonie Man note: "Follow-up as an outpatient in the stroke clinic in 6 weeks." Please setup stroke follow up with Dr. Leonie Man.   I saw patient for seizure and memory loss previously x 1. Looks like family has questions pertaining to stroke.   -VRP

## 2016-12-06 NOTE — Telephone Encounter (Signed)
Rn spoke with daughter [name] about hospital Stroke follow up. Pt was seen in 10/2016 by Dr. Leonie Man for stroke. RN schedule appt with Gwenevere Ghazi she will contact [facility] for transportation arrangements.

## 2016-12-06 NOTE — Telephone Encounter (Signed)
Agree with plan 

## 2016-12-07 ENCOUNTER — Encounter: Payer: Self-pay | Admitting: Nurse Practitioner

## 2016-12-08 ENCOUNTER — Encounter: Payer: Self-pay | Admitting: Neurology

## 2016-12-08 ENCOUNTER — Ambulatory Visit (INDEPENDENT_AMBULATORY_CARE_PROVIDER_SITE_OTHER): Payer: Medicare Other | Admitting: Neurology

## 2016-12-08 VITALS — BP 158/81 | HR 73

## 2016-12-08 DIAGNOSIS — I63331 Cerebral infarction due to thrombosis of right posterior cerebral artery: Secondary | ICD-10-CM | POA: Diagnosis not present

## 2016-12-08 NOTE — Patient Instructions (Signed)
I had a long d/w patient and her daughter about her recent right posterior cerebral artery stroke, risk for recurrent stroke/TIAs, personally independently reviewed imaging studies and stroke evaluation results and answered questions.Continue aspirin 325 mg daily and clopidogrel 75 mg daily for 1 more month and then discontinue aspirin and stay on clopidogrel alone for secondary stroke prevention and maintain strict control of hypertension with blood pressure goal below 130/90, diabetes with hemoglobin A1c goal below 6.5% and lipids with LDL cholesterol goal below 70 mg/dL. I encouraged the patient to participate in physical and occupational therapy.  Continue Cymbalta for depression and Keppra for seizures but give the medications with meals to make it easier for her to swallow.  Continue ongoing physical and occupational therapy.  No scheduled follow-up appointment is necessary but she may be referred back in the future as needed.

## 2016-12-08 NOTE — Progress Notes (Signed)
[facility] [address] [place]. [place] [address] [phone]       OFFICE FOLLOW-UP NOTE  Kelly. [name] Date of Birth:  21-Nov-1926 Medical Record Number:  [id number]   HPI: [name] is a 83 year old Caucasian lady who seen today for first office follow-up with the following hospital admission for stroke in September 2018.  History is obtained from the patient, her daughter and review of electronic medical records and have personally reviewed imaging films.[name] is a 83 y.o. female with a history of previous stroke who presents with increased falls. She states that her left side is been weak "for about a year" but seems little worse recently. She has difficulty giving me an exact time of onset for these symptoms.LKW: Unclear time of onset.tpa given?: no, unclear time of onset, the likely more than a week.  MRI scan of the brain showed acute to early subacute infarct involving the right occipital lobe and right lateral thalamus and posterior limb internal capsule.  There is area of reduced diffusion in the hippocampus which may represent ictal activity or ischemia from her stroke.  CT angiogram showed occlusion of the right posterior cerebral artery in the P1 segment.  Transthoracic echo showed normal ejection fraction.  LDL cholesterol was 65 mg percent.  HDL was 38 mg percent.  Hemoglobin A1c was 6.  Patient was started on dual antiplatelet therapy aspirin and Plavix and seen by therapies.  She was moved to [facility] for ongoing therapy needs.  The patient's daughter states that the patient is been depressed and has not been participating with therapy.  She did have significant hematoma in the left knee which limited her ability to participate with therapy.  The hematoma is not improving.  She has been started on Cymbalta for the last few weeks.  The patient however is complaining of nausea and dizziness and has been refusing therapy.  She is still total assist and  can barely stand even with the help of therapist and has not been walking.  She keeps her head turned to the right side.  The patient's daughter appears disheartened that she is not getting the therapy she needs and is thinking about taking her home but is realistic about how she will be able to handle the patient's needs.  She remains on Keppra and has had no overt seizures since June 2018.    ROS:   14 system review of systems is positive for fatigue, loss of vision, choking, leg swelling, cold intolerance, joint pain and swelling, back pain, aching muscles, muscle cramps, walking difficulty, neck pain and stiffness, skin wounds, skin itching, easy bruising, memory loss, dizziness, seizure, speech difficulty, weakness, facial drooping, depression and all other systems negative PMH:  Past Medical History:  Diagnosis Date  . Acute blood loss anemia   . Acute encephalopathy   . Altered mental state   . Arthritis   . Atrophic gastritis   . Breast cancer ([place])    right  . Candida esophagitis ([place])   . Chest tightness   . Chronic gastritis   . Closed hip fracture ([place])   . Closed intertrochanteric fracture of left femur with routine healing   . Diverticulosis   . Hypertension   . IBS (irritable bowel syndrome)   . Mitral valve prolapse   . MVP (mitral valve prolapse)   . Osteoporosis   . Palpitation   . Polycythemia   . PVC's (premature ventricular contractions)   . Rhinitis   .  Seizures ([place])   . Stroke ([place])   . Unsteady gait     Social History:  Social History   Socioeconomic History  . Marital status: Widowed    Spouse name: Not on file  . Number of children: 2  . Years of education: Not on file  . Highest education level: Not on file  Social Needs  . Financial resource strain: Not on file  . Food insecurity - worry: Not on file  . Food insecurity - inability: Not on file  . Transportation needs - medical: Not on file  . Transportation needs - non-medical: Not on  file  Occupational History  . Occupation: retired    Fish farm manager: RETIRED  Tobacco Use  . Smoking status: Former Smoker    Last attempt to quit: 08/24/1980    Years since quitting: 36.3  . Smokeless tobacco: Never Used  Substance and Sexual Activity  . Alcohol use: Yes    Comment: occasional  . Drug use: No  . Sexual activity: Not on file  Other Topics Concern  . Not on file  Social History Narrative  . Not on file    Medications:   Current Outpatient Medications on File Prior to Visit  Medication Sig Dispense Refill  . acetaminophen (TYLENOL) 325 MG tablet Take 650 mg by mouth every 6 (six) hours as needed.    Marland Kitchen aspirin 325 MG tablet Take 1 tablet (325 mg total) by mouth daily. 30 tablet 0  . Calcium Carbonate-Vitamin D (CALCIUM 500/D PO) Take 1 tablet by mouth daily.     . cetirizine (ZYRTEC) 10 MG tablet Take 10 mg by mouth daily as needed for allergies.    Marland Kitchen clopidogrel (PLAVIX) 75 MG tablet Take 1 tablet (75 mg total) by mouth daily. 30 tablet 0  . DULoxetine (CYMBALTA) 60 MG capsule Take 60 mg daily by mouth.    . fluticasone (FLONASE) 50 MCG/ACT nasal spray Place 2 sprays into both nostrils as needed for allergies or rhinitis (as directed). Reported on 06/11/2015    . hydroxyurea (HYDREA) 500 MG capsule 500mg  po daily for 4 days, then every other day 20 capsule 0  . levETIRAcetam (KEPPRA) 500 MG tablet Take 1 tablet (500 mg total) by mouth 2 (two) times daily. 180 tablet 4  . levothyroxine (SYNTHROID, LEVOTHROID) 25 MCG tablet Take 25 mcg by mouth daily before breakfast.     . Menthol (ICY HOT) 5 % PTCH Apply topically. Apply patch to both knees and back as needed for pain    . metoprolol succinate (TOPROL-XL) 50 MG 24 hr tablet Take 50 mg by mouth daily. Take with or immediately following a meal.    . olmesartan (BENICAR) 20 MG tablet Take 1 tablet (20 mg total) by mouth daily. 90 tablet 3  . pantoprazole (PROTONIX) 40 MG tablet Take 1 tablet (40 mg total) by mouth as needed  (for stomach issues).    . Probiotic Product (ULTRAFLORA IMMUNE HEALTH PO) Take 1 tablet by mouth daily. Reported on 07/01/2015     No current facility-administered medications on file prior to visit.     Allergies:   Allergies  Allergen Reactions  . Sulfa Antibiotics     From childhood; patient doesn't recall reaction  . Sulfur     From childhood; patient doesn't recall reaction    Physical Exam General: Frail cachectic looking elderly Caucasian lady seated, in no evident distress Head: head normocephalic and atraumatic.  Neck: supple with no carotid or supraclavicular bruits Cardiovascular:  regular rate and rhythm, no murmurs Musculoskeletal: no deformity but head turned constantly to the right. Skin:  no rash/petichiae Vascular:  Normal pulses all extremities Vitals:   12/08/16 0940  BP: (!) 158/81  Pulse: 73   Neurologic Exam Mental Status: Awake and fully alert. Oriented to place and time. Recent and remote memory poor attention span, concentration and fund of knowledge appropriate. Mood and affect depressed Cranial Nerves: Fundoscopic exam reveals sharp disc margins. Pupils equal, briskly reactive to light.  Right gaze preference but extraocular movements full without nystagmus. Visual fields showed dense left homonymous hemianopsia to confrontation. Hearing intact. Facial sensation intact.  Mild lower left facial weakness face, tongue, palate moves normally and symmetrically.  Motor: Diminished l bulk and tone.  Left hemiplegia with 2/5 left lower extremity strength with left foot drop and 3/5 left upper extremity strength with grip weakness. Sensory.:  Diminished in left hemibody touch ,pinprick .position and vibratory sensation.  Coordination: Rapid alternating movements normal in all extremities. Finger-to-nose and heel-to-shin performed accurately bilaterally. Gait and Station: Unable to test.  Patient in wheelchair.  Reflexes: 2+ and asymmetric brisker on the left. Toes  downgoing.   NIHSS  11 Modified Rankin  4   ASSESSMENT: 65 year Caucasian lady with right posterior cerebral artery infarct due to right posterior cerebral artery occlusion from intracranial atherosclerosis in September 28 with significant residual left hemiplegia and homonymous hemianopsia.  Vascular risk factors of hypertension , hypercoagulability from Polycythemia vera and intracranial atherosclerosis.  History of seizure disorders controlled on Keppra.  Significant post stroke depression limits her progression with therapy    PLAN: I had a long d/w patient and her daughter about her recent right posterior cerebral artery stroke, risk for recurrent stroke/TIAs, personally independently reviewed imaging studies and stroke evaluation results and answered questions.Continue aspirin 325 mg daily and clopidogrel 75 mg daily for 1 more month and then discontinue aspirin and stay on clopidogrel alone for secondary stroke prevention and maintain strict control of hypertension with blood pressure goal below 130/90, diabetes with hemoglobin A1c goal below 6.5% and lipids with LDL cholesterol goal below 70 mg/dL. I encouraged the patient to participate in physical and occupational therapy.  Continue Cymbalta for depression and Keppra for seizures but give the medications with meals to make it easier for her to swallow.  Continue ongoing physical and occupational therapy.  No scheduled follow-up appointment is necessary but she may be referred back in the future as needed.   Greater than 50% of time during this 35 minute visit was spent on counseling,explanation of diagnosis of stroke, post stroke depression, planning of further management, discussion with patient and family and coordination of care Antony Contras, MD  [facility] [address] [place], [place] [address]  Phone [phone] Fax [phone] Note: This document was prepared with digital dictation and  possible smart phrase technology. Any transcriptional errors that result from this process are unintentional

## 2016-12-09 ENCOUNTER — Telehealth: Payer: Self-pay | Admitting: Neurology

## 2016-12-09 NOTE — Telephone Encounter (Signed)
Patient's daughter in lobby to pick up letter regarding her mother not being able to take care of her affairs. Best call back is [phone]

## 2016-12-09 NOTE — Telephone Encounter (Signed)
Left vm for patient's daughter that letter was done for her mom. Patient's daughter needed the letter for her moms finances.

## 2016-12-10 NOTE — Telephone Encounter (Signed)
LEft vm for patients daughter that letter is at the front desk for pick for her mom.

## 2016-12-15 ENCOUNTER — Other Ambulatory Visit: Payer: Medicare Other

## 2016-12-15 ENCOUNTER — Ambulatory Visit: Payer: Medicare Other | Admitting: Hematology

## 2016-12-21 ENCOUNTER — Telehealth: Payer: Self-pay | Admitting: Hematology

## 2016-12-21 NOTE — Telephone Encounter (Signed)
Scheduled appt per 11/14 sch msg - left voicemail for patient regarding this appointment.

## 2016-12-22 ENCOUNTER — Ambulatory Visit: Payer: Medicare Other | Admitting: Hematology

## 2016-12-22 ENCOUNTER — Other Ambulatory Visit: Payer: Medicare Other

## 2016-12-31 DIAGNOSIS — I10 Essential (primary) hypertension: Secondary | ICD-10-CM | POA: Diagnosis not present

## 2016-12-31 DIAGNOSIS — I69354 Hemiplegia and hemiparesis following cerebral infarction affecting left non-dominant side: Secondary | ICD-10-CM | POA: Diagnosis not present

## 2016-12-31 DIAGNOSIS — E039 Hypothyroidism, unspecified: Secondary | ICD-10-CM | POA: Diagnosis not present

## 2016-12-31 DIAGNOSIS — F329 Major depressive disorder, single episode, unspecified: Secondary | ICD-10-CM | POA: Diagnosis not present

## 2016-12-31 DIAGNOSIS — I341 Nonrheumatic mitral (valve) prolapse: Secondary | ICD-10-CM | POA: Diagnosis not present

## 2016-12-31 DIAGNOSIS — M1990 Unspecified osteoarthritis, unspecified site: Secondary | ICD-10-CM | POA: Diagnosis not present

## 2017-01-03 DIAGNOSIS — M1990 Unspecified osteoarthritis, unspecified site: Secondary | ICD-10-CM | POA: Diagnosis not present

## 2017-01-03 DIAGNOSIS — F329 Major depressive disorder, single episode, unspecified: Secondary | ICD-10-CM | POA: Diagnosis not present

## 2017-01-03 DIAGNOSIS — I69354 Hemiplegia and hemiparesis following cerebral infarction affecting left non-dominant side: Secondary | ICD-10-CM | POA: Diagnosis not present

## 2017-01-03 DIAGNOSIS — I10 Essential (primary) hypertension: Secondary | ICD-10-CM | POA: Diagnosis not present

## 2017-01-03 DIAGNOSIS — I341 Nonrheumatic mitral (valve) prolapse: Secondary | ICD-10-CM | POA: Diagnosis not present

## 2017-01-03 DIAGNOSIS — E039 Hypothyroidism, unspecified: Secondary | ICD-10-CM | POA: Diagnosis not present

## 2017-01-05 DIAGNOSIS — I69354 Hemiplegia and hemiparesis following cerebral infarction affecting left non-dominant side: Secondary | ICD-10-CM | POA: Diagnosis not present

## 2017-01-05 DIAGNOSIS — I341 Nonrheumatic mitral (valve) prolapse: Secondary | ICD-10-CM | POA: Diagnosis not present

## 2017-01-05 DIAGNOSIS — I10 Essential (primary) hypertension: Secondary | ICD-10-CM | POA: Diagnosis not present

## 2017-01-05 DIAGNOSIS — M1990 Unspecified osteoarthritis, unspecified site: Secondary | ICD-10-CM | POA: Diagnosis not present

## 2017-01-05 DIAGNOSIS — E039 Hypothyroidism, unspecified: Secondary | ICD-10-CM | POA: Diagnosis not present

## 2017-01-05 DIAGNOSIS — F329 Major depressive disorder, single episode, unspecified: Secondary | ICD-10-CM | POA: Diagnosis not present

## 2017-01-06 DIAGNOSIS — C50919 Malignant neoplasm of unspecified site of unspecified female breast: Secondary | ICD-10-CM | POA: Diagnosis not present

## 2017-01-06 DIAGNOSIS — Z7901 Long term (current) use of anticoagulants: Secondary | ICD-10-CM | POA: Diagnosis not present

## 2017-01-06 DIAGNOSIS — E039 Hypothyroidism, unspecified: Secondary | ICD-10-CM | POA: Diagnosis not present

## 2017-01-06 DIAGNOSIS — D45 Polycythemia vera: Secondary | ICD-10-CM | POA: Diagnosis not present

## 2017-01-06 DIAGNOSIS — F329 Major depressive disorder, single episode, unspecified: Secondary | ICD-10-CM | POA: Diagnosis not present

## 2017-01-06 DIAGNOSIS — I639 Cerebral infarction, unspecified: Secondary | ICD-10-CM | POA: Diagnosis not present

## 2017-01-07 DIAGNOSIS — I69354 Hemiplegia and hemiparesis following cerebral infarction affecting left non-dominant side: Secondary | ICD-10-CM | POA: Diagnosis not present

## 2017-01-07 DIAGNOSIS — I341 Nonrheumatic mitral (valve) prolapse: Secondary | ICD-10-CM | POA: Diagnosis not present

## 2017-01-07 DIAGNOSIS — F329 Major depressive disorder, single episode, unspecified: Secondary | ICD-10-CM | POA: Diagnosis not present

## 2017-01-07 DIAGNOSIS — M1990 Unspecified osteoarthritis, unspecified site: Secondary | ICD-10-CM | POA: Diagnosis not present

## 2017-01-07 DIAGNOSIS — I10 Essential (primary) hypertension: Secondary | ICD-10-CM | POA: Diagnosis not present

## 2017-01-07 DIAGNOSIS — E039 Hypothyroidism, unspecified: Secondary | ICD-10-CM | POA: Diagnosis not present

## 2017-01-08 ENCOUNTER — Other Ambulatory Visit: Payer: Self-pay | Admitting: Interventional Cardiology

## 2017-01-11 DIAGNOSIS — I10 Essential (primary) hypertension: Secondary | ICD-10-CM | POA: Diagnosis not present

## 2017-01-11 DIAGNOSIS — I341 Nonrheumatic mitral (valve) prolapse: Secondary | ICD-10-CM | POA: Diagnosis not present

## 2017-01-11 DIAGNOSIS — F329 Major depressive disorder, single episode, unspecified: Secondary | ICD-10-CM | POA: Diagnosis not present

## 2017-01-11 DIAGNOSIS — E039 Hypothyroidism, unspecified: Secondary | ICD-10-CM | POA: Diagnosis not present

## 2017-01-11 DIAGNOSIS — M1990 Unspecified osteoarthritis, unspecified site: Secondary | ICD-10-CM | POA: Diagnosis not present

## 2017-01-11 DIAGNOSIS — I69354 Hemiplegia and hemiparesis following cerebral infarction affecting left non-dominant side: Secondary | ICD-10-CM | POA: Diagnosis not present

## 2017-01-12 DIAGNOSIS — I341 Nonrheumatic mitral (valve) prolapse: Secondary | ICD-10-CM | POA: Diagnosis not present

## 2017-01-12 DIAGNOSIS — M1990 Unspecified osteoarthritis, unspecified site: Secondary | ICD-10-CM | POA: Diagnosis not present

## 2017-01-12 DIAGNOSIS — E039 Hypothyroidism, unspecified: Secondary | ICD-10-CM | POA: Diagnosis not present

## 2017-01-12 DIAGNOSIS — I10 Essential (primary) hypertension: Secondary | ICD-10-CM | POA: Diagnosis not present

## 2017-01-12 DIAGNOSIS — I69354 Hemiplegia and hemiparesis following cerebral infarction affecting left non-dominant side: Secondary | ICD-10-CM | POA: Diagnosis not present

## 2017-01-12 DIAGNOSIS — F329 Major depressive disorder, single episode, unspecified: Secondary | ICD-10-CM | POA: Diagnosis not present

## 2017-01-13 ENCOUNTER — Other Ambulatory Visit: Payer: Medicare Other

## 2017-01-13 ENCOUNTER — Ambulatory Visit: Payer: Medicare Other | Admitting: Hematology

## 2017-01-13 DIAGNOSIS — M1990 Unspecified osteoarthritis, unspecified site: Secondary | ICD-10-CM | POA: Diagnosis not present

## 2017-01-13 DIAGNOSIS — I69354 Hemiplegia and hemiparesis following cerebral infarction affecting left non-dominant side: Secondary | ICD-10-CM | POA: Diagnosis not present

## 2017-01-13 DIAGNOSIS — F329 Major depressive disorder, single episode, unspecified: Secondary | ICD-10-CM | POA: Diagnosis not present

## 2017-01-13 DIAGNOSIS — I10 Essential (primary) hypertension: Secondary | ICD-10-CM | POA: Diagnosis not present

## 2017-01-13 DIAGNOSIS — I341 Nonrheumatic mitral (valve) prolapse: Secondary | ICD-10-CM | POA: Diagnosis not present

## 2017-01-13 DIAGNOSIS — E039 Hypothyroidism, unspecified: Secondary | ICD-10-CM | POA: Diagnosis not present

## 2017-01-14 DIAGNOSIS — M1990 Unspecified osteoarthritis, unspecified site: Secondary | ICD-10-CM | POA: Diagnosis not present

## 2017-01-14 DIAGNOSIS — F329 Major depressive disorder, single episode, unspecified: Secondary | ICD-10-CM | POA: Diagnosis not present

## 2017-01-14 DIAGNOSIS — I341 Nonrheumatic mitral (valve) prolapse: Secondary | ICD-10-CM | POA: Diagnosis not present

## 2017-01-14 DIAGNOSIS — I69354 Hemiplegia and hemiparesis following cerebral infarction affecting left non-dominant side: Secondary | ICD-10-CM | POA: Diagnosis not present

## 2017-01-14 DIAGNOSIS — E039 Hypothyroidism, unspecified: Secondary | ICD-10-CM | POA: Diagnosis not present

## 2017-01-14 DIAGNOSIS — I10 Essential (primary) hypertension: Secondary | ICD-10-CM | POA: Diagnosis not present

## 2017-01-17 DIAGNOSIS — I69354 Hemiplegia and hemiparesis following cerebral infarction affecting left non-dominant side: Secondary | ICD-10-CM | POA: Diagnosis not present

## 2017-01-17 DIAGNOSIS — I10 Essential (primary) hypertension: Secondary | ICD-10-CM | POA: Diagnosis not present

## 2017-01-17 DIAGNOSIS — E039 Hypothyroidism, unspecified: Secondary | ICD-10-CM | POA: Diagnosis not present

## 2017-01-17 DIAGNOSIS — I341 Nonrheumatic mitral (valve) prolapse: Secondary | ICD-10-CM | POA: Diagnosis not present

## 2017-01-17 DIAGNOSIS — F329 Major depressive disorder, single episode, unspecified: Secondary | ICD-10-CM | POA: Diagnosis not present

## 2017-01-17 DIAGNOSIS — M1990 Unspecified osteoarthritis, unspecified site: Secondary | ICD-10-CM | POA: Diagnosis not present

## 2017-01-18 DIAGNOSIS — M1990 Unspecified osteoarthritis, unspecified site: Secondary | ICD-10-CM | POA: Diagnosis not present

## 2017-01-18 DIAGNOSIS — I10 Essential (primary) hypertension: Secondary | ICD-10-CM | POA: Diagnosis not present

## 2017-01-18 DIAGNOSIS — F329 Major depressive disorder, single episode, unspecified: Secondary | ICD-10-CM | POA: Diagnosis not present

## 2017-01-18 DIAGNOSIS — E039 Hypothyroidism, unspecified: Secondary | ICD-10-CM | POA: Diagnosis not present

## 2017-01-18 DIAGNOSIS — I69354 Hemiplegia and hemiparesis following cerebral infarction affecting left non-dominant side: Secondary | ICD-10-CM | POA: Diagnosis not present

## 2017-01-18 DIAGNOSIS — I341 Nonrheumatic mitral (valve) prolapse: Secondary | ICD-10-CM | POA: Diagnosis not present

## 2017-01-19 DIAGNOSIS — M1990 Unspecified osteoarthritis, unspecified site: Secondary | ICD-10-CM | POA: Diagnosis not present

## 2017-01-19 DIAGNOSIS — I10 Essential (primary) hypertension: Secondary | ICD-10-CM | POA: Diagnosis not present

## 2017-01-19 DIAGNOSIS — E039 Hypothyroidism, unspecified: Secondary | ICD-10-CM | POA: Diagnosis not present

## 2017-01-19 DIAGNOSIS — F329 Major depressive disorder, single episode, unspecified: Secondary | ICD-10-CM | POA: Diagnosis not present

## 2017-01-19 DIAGNOSIS — I341 Nonrheumatic mitral (valve) prolapse: Secondary | ICD-10-CM | POA: Diagnosis not present

## 2017-01-19 DIAGNOSIS — I69354 Hemiplegia and hemiparesis following cerebral infarction affecting left non-dominant side: Secondary | ICD-10-CM | POA: Diagnosis not present

## 2017-01-20 DIAGNOSIS — E039 Hypothyroidism, unspecified: Secondary | ICD-10-CM | POA: Diagnosis not present

## 2017-01-20 DIAGNOSIS — I10 Essential (primary) hypertension: Secondary | ICD-10-CM | POA: Diagnosis not present

## 2017-01-20 DIAGNOSIS — I341 Nonrheumatic mitral (valve) prolapse: Secondary | ICD-10-CM | POA: Diagnosis not present

## 2017-01-20 DIAGNOSIS — F329 Major depressive disorder, single episode, unspecified: Secondary | ICD-10-CM | POA: Diagnosis not present

## 2017-01-20 DIAGNOSIS — M1990 Unspecified osteoarthritis, unspecified site: Secondary | ICD-10-CM | POA: Diagnosis not present

## 2017-01-20 DIAGNOSIS — I69354 Hemiplegia and hemiparesis following cerebral infarction affecting left non-dominant side: Secondary | ICD-10-CM | POA: Diagnosis not present

## 2017-01-21 DIAGNOSIS — F329 Major depressive disorder, single episode, unspecified: Secondary | ICD-10-CM | POA: Diagnosis not present

## 2017-01-21 DIAGNOSIS — M1990 Unspecified osteoarthritis, unspecified site: Secondary | ICD-10-CM | POA: Diagnosis not present

## 2017-01-21 DIAGNOSIS — I10 Essential (primary) hypertension: Secondary | ICD-10-CM | POA: Diagnosis not present

## 2017-01-21 DIAGNOSIS — I341 Nonrheumatic mitral (valve) prolapse: Secondary | ICD-10-CM | POA: Diagnosis not present

## 2017-01-21 DIAGNOSIS — I69354 Hemiplegia and hemiparesis following cerebral infarction affecting left non-dominant side: Secondary | ICD-10-CM | POA: Diagnosis not present

## 2017-01-21 DIAGNOSIS — E039 Hypothyroidism, unspecified: Secondary | ICD-10-CM | POA: Diagnosis not present

## 2017-01-23 DIAGNOSIS — E039 Hypothyroidism, unspecified: Secondary | ICD-10-CM | POA: Diagnosis not present

## 2017-01-23 DIAGNOSIS — I10 Essential (primary) hypertension: Secondary | ICD-10-CM | POA: Diagnosis not present

## 2017-01-23 DIAGNOSIS — M1990 Unspecified osteoarthritis, unspecified site: Secondary | ICD-10-CM | POA: Diagnosis not present

## 2017-01-23 DIAGNOSIS — I69354 Hemiplegia and hemiparesis following cerebral infarction affecting left non-dominant side: Secondary | ICD-10-CM | POA: Diagnosis not present

## 2017-01-23 DIAGNOSIS — I341 Nonrheumatic mitral (valve) prolapse: Secondary | ICD-10-CM | POA: Diagnosis not present

## 2017-01-23 DIAGNOSIS — F329 Major depressive disorder, single episode, unspecified: Secondary | ICD-10-CM | POA: Diagnosis not present

## 2017-01-24 DIAGNOSIS — E039 Hypothyroidism, unspecified: Secondary | ICD-10-CM | POA: Diagnosis not present

## 2017-01-24 DIAGNOSIS — M1990 Unspecified osteoarthritis, unspecified site: Secondary | ICD-10-CM | POA: Diagnosis not present

## 2017-01-24 DIAGNOSIS — F329 Major depressive disorder, single episode, unspecified: Secondary | ICD-10-CM | POA: Diagnosis not present

## 2017-01-24 DIAGNOSIS — I10 Essential (primary) hypertension: Secondary | ICD-10-CM | POA: Diagnosis not present

## 2017-01-24 DIAGNOSIS — I69354 Hemiplegia and hemiparesis following cerebral infarction affecting left non-dominant side: Secondary | ICD-10-CM | POA: Diagnosis not present

## 2017-01-24 DIAGNOSIS — I341 Nonrheumatic mitral (valve) prolapse: Secondary | ICD-10-CM | POA: Diagnosis not present

## 2017-01-26 DIAGNOSIS — M1990 Unspecified osteoarthritis, unspecified site: Secondary | ICD-10-CM | POA: Diagnosis not present

## 2017-01-26 DIAGNOSIS — F329 Major depressive disorder, single episode, unspecified: Secondary | ICD-10-CM | POA: Diagnosis not present

## 2017-01-26 DIAGNOSIS — I69354 Hemiplegia and hemiparesis following cerebral infarction affecting left non-dominant side: Secondary | ICD-10-CM | POA: Diagnosis not present

## 2017-01-26 DIAGNOSIS — I341 Nonrheumatic mitral (valve) prolapse: Secondary | ICD-10-CM | POA: Diagnosis not present

## 2017-01-26 DIAGNOSIS — E039 Hypothyroidism, unspecified: Secondary | ICD-10-CM | POA: Diagnosis not present

## 2017-01-26 DIAGNOSIS — I10 Essential (primary) hypertension: Secondary | ICD-10-CM | POA: Diagnosis not present

## 2017-01-27 DIAGNOSIS — F329 Major depressive disorder, single episode, unspecified: Secondary | ICD-10-CM | POA: Diagnosis not present

## 2017-01-27 DIAGNOSIS — E039 Hypothyroidism, unspecified: Secondary | ICD-10-CM | POA: Diagnosis not present

## 2017-01-27 DIAGNOSIS — I69354 Hemiplegia and hemiparesis following cerebral infarction affecting left non-dominant side: Secondary | ICD-10-CM | POA: Diagnosis not present

## 2017-01-27 DIAGNOSIS — I10 Essential (primary) hypertension: Secondary | ICD-10-CM | POA: Diagnosis not present

## 2017-01-27 DIAGNOSIS — M1990 Unspecified osteoarthritis, unspecified site: Secondary | ICD-10-CM | POA: Diagnosis not present

## 2017-01-27 DIAGNOSIS — I341 Nonrheumatic mitral (valve) prolapse: Secondary | ICD-10-CM | POA: Diagnosis not present

## 2017-01-31 ENCOUNTER — Telehealth: Payer: Self-pay | Admitting: Hematology

## 2017-01-31 DIAGNOSIS — I69354 Hemiplegia and hemiparesis following cerebral infarction affecting left non-dominant side: Secondary | ICD-10-CM | POA: Diagnosis not present

## 2017-01-31 DIAGNOSIS — I10 Essential (primary) hypertension: Secondary | ICD-10-CM | POA: Diagnosis not present

## 2017-01-31 DIAGNOSIS — E039 Hypothyroidism, unspecified: Secondary | ICD-10-CM | POA: Diagnosis not present

## 2017-01-31 DIAGNOSIS — F329 Major depressive disorder, single episode, unspecified: Secondary | ICD-10-CM | POA: Diagnosis not present

## 2017-01-31 DIAGNOSIS — I341 Nonrheumatic mitral (valve) prolapse: Secondary | ICD-10-CM | POA: Diagnosis not present

## 2017-01-31 DIAGNOSIS — M1990 Unspecified osteoarthritis, unspecified site: Secondary | ICD-10-CM | POA: Diagnosis not present

## 2017-01-31 NOTE — Telephone Encounter (Signed)
Pt's daughter [name] came in a few days ago and dropped off her recent lab results from 01/06/2017: WBC 8.2, hematocrit 49.7, hemoglobin 15.7, platelets 353K.  Patient is on Hydrea 500 mg every other day.  She is pretty much homebound, it is difficult for her to come out.  She has a caregiver at home.  Due to her advanced age, poor mobility after stroke, it will be very difficult for her to come in for phlebotomy.  I recommend her to increase Hydrea to 500mg   4 days a week (Tuesday, Thursday, Saturday and Sunday), and let home care nurse to check CBC every 1-2 months. [name] voiced good understanding, and agrees with the plan.  She will keep [place] posted.  Truitt Merle  01/31/2017

## 2017-02-04 DIAGNOSIS — I341 Nonrheumatic mitral (valve) prolapse: Secondary | ICD-10-CM | POA: Diagnosis not present

## 2017-02-04 DIAGNOSIS — I69354 Hemiplegia and hemiparesis following cerebral infarction affecting left non-dominant side: Secondary | ICD-10-CM | POA: Diagnosis not present

## 2017-02-04 DIAGNOSIS — I10 Essential (primary) hypertension: Secondary | ICD-10-CM | POA: Diagnosis not present

## 2017-02-04 DIAGNOSIS — M1990 Unspecified osteoarthritis, unspecified site: Secondary | ICD-10-CM | POA: Diagnosis not present

## 2017-02-04 DIAGNOSIS — E039 Hypothyroidism, unspecified: Secondary | ICD-10-CM | POA: Diagnosis not present

## 2017-02-04 DIAGNOSIS — F329 Major depressive disorder, single episode, unspecified: Secondary | ICD-10-CM | POA: Diagnosis not present

## 2017-02-09 DIAGNOSIS — G4089 Other seizures: Secondary | ICD-10-CM | POA: Diagnosis not present

## 2017-02-09 DIAGNOSIS — F339 Major depressive disorder, recurrent, unspecified: Secondary | ICD-10-CM | POA: Diagnosis not present

## 2017-02-09 DIAGNOSIS — K219 Gastro-esophageal reflux disease without esophagitis: Secondary | ICD-10-CM | POA: Diagnosis not present

## 2017-02-09 DIAGNOSIS — I63331 Cerebral infarction due to thrombosis of right posterior cerebral artery: Secondary | ICD-10-CM | POA: Diagnosis not present

## 2017-02-09 DIAGNOSIS — D45 Polycythemia vera: Secondary | ICD-10-CM | POA: Diagnosis not present

## 2017-02-09 DIAGNOSIS — J301 Allergic rhinitis due to pollen: Secondary | ICD-10-CM | POA: Diagnosis not present

## 2017-02-09 DIAGNOSIS — I672 Cerebral atherosclerosis: Secondary | ICD-10-CM | POA: Diagnosis not present

## 2017-02-09 DIAGNOSIS — R4182 Altered mental status, unspecified: Secondary | ICD-10-CM | POA: Diagnosis not present

## 2017-02-09 DIAGNOSIS — E039 Hypothyroidism, unspecified: Secondary | ICD-10-CM | POA: Diagnosis not present

## 2017-02-09 DIAGNOSIS — H53452 Other localized visual field defect, left eye: Secondary | ICD-10-CM | POA: Diagnosis not present

## 2017-02-09 DIAGNOSIS — G8102 Flaccid hemiplegia affecting left dominant side: Secondary | ICD-10-CM | POA: Diagnosis not present

## 2017-02-09 DIAGNOSIS — M1990 Unspecified osteoarthritis, unspecified site: Secondary | ICD-10-CM | POA: Diagnosis not present

## 2017-02-09 DIAGNOSIS — I10 Essential (primary) hypertension: Secondary | ICD-10-CM | POA: Diagnosis not present

## 2017-02-10 DIAGNOSIS — I672 Cerebral atherosclerosis: Secondary | ICD-10-CM | POA: Diagnosis not present

## 2017-02-10 DIAGNOSIS — I63331 Cerebral infarction due to thrombosis of right posterior cerebral artery: Secondary | ICD-10-CM | POA: Diagnosis not present

## 2017-02-10 DIAGNOSIS — G8102 Flaccid hemiplegia affecting left dominant side: Secondary | ICD-10-CM | POA: Diagnosis not present

## 2017-02-10 DIAGNOSIS — R4182 Altered mental status, unspecified: Secondary | ICD-10-CM | POA: Diagnosis not present

## 2017-02-10 DIAGNOSIS — H53452 Other localized visual field defect, left eye: Secondary | ICD-10-CM | POA: Diagnosis not present

## 2017-02-10 DIAGNOSIS — D45 Polycythemia vera: Secondary | ICD-10-CM | POA: Diagnosis not present

## 2017-02-11 DIAGNOSIS — H53452 Other localized visual field defect, left eye: Secondary | ICD-10-CM | POA: Diagnosis not present

## 2017-02-11 DIAGNOSIS — R4182 Altered mental status, unspecified: Secondary | ICD-10-CM | POA: Diagnosis not present

## 2017-02-11 DIAGNOSIS — I63331 Cerebral infarction due to thrombosis of right posterior cerebral artery: Secondary | ICD-10-CM | POA: Diagnosis not present

## 2017-02-11 DIAGNOSIS — G8102 Flaccid hemiplegia affecting left dominant side: Secondary | ICD-10-CM | POA: Diagnosis not present

## 2017-02-11 DIAGNOSIS — D45 Polycythemia vera: Secondary | ICD-10-CM | POA: Diagnosis not present

## 2017-02-11 DIAGNOSIS — I672 Cerebral atherosclerosis: Secondary | ICD-10-CM | POA: Diagnosis not present

## 2017-02-15 DIAGNOSIS — I63331 Cerebral infarction due to thrombosis of right posterior cerebral artery: Secondary | ICD-10-CM | POA: Diagnosis not present

## 2017-02-15 DIAGNOSIS — R4182 Altered mental status, unspecified: Secondary | ICD-10-CM | POA: Diagnosis not present

## 2017-02-15 DIAGNOSIS — D45 Polycythemia vera: Secondary | ICD-10-CM | POA: Diagnosis not present

## 2017-02-15 DIAGNOSIS — H53452 Other localized visual field defect, left eye: Secondary | ICD-10-CM | POA: Diagnosis not present

## 2017-02-15 DIAGNOSIS — I672 Cerebral atherosclerosis: Secondary | ICD-10-CM | POA: Diagnosis not present

## 2017-02-15 DIAGNOSIS — G8102 Flaccid hemiplegia affecting left dominant side: Secondary | ICD-10-CM | POA: Diagnosis not present

## 2017-02-22 DIAGNOSIS — D45 Polycythemia vera: Secondary | ICD-10-CM | POA: Diagnosis not present

## 2017-02-22 DIAGNOSIS — I63331 Cerebral infarction due to thrombosis of right posterior cerebral artery: Secondary | ICD-10-CM | POA: Diagnosis not present

## 2017-02-22 DIAGNOSIS — I672 Cerebral atherosclerosis: Secondary | ICD-10-CM | POA: Diagnosis not present

## 2017-02-22 DIAGNOSIS — G8102 Flaccid hemiplegia affecting left dominant side: Secondary | ICD-10-CM | POA: Diagnosis not present

## 2017-02-22 DIAGNOSIS — R4182 Altered mental status, unspecified: Secondary | ICD-10-CM | POA: Diagnosis not present

## 2017-02-22 DIAGNOSIS — H53452 Other localized visual field defect, left eye: Secondary | ICD-10-CM | POA: Diagnosis not present

## 2017-02-23 DIAGNOSIS — G8102 Flaccid hemiplegia affecting left dominant side: Secondary | ICD-10-CM | POA: Diagnosis not present

## 2017-02-23 DIAGNOSIS — I63331 Cerebral infarction due to thrombosis of right posterior cerebral artery: Secondary | ICD-10-CM | POA: Diagnosis not present

## 2017-02-23 DIAGNOSIS — H53452 Other localized visual field defect, left eye: Secondary | ICD-10-CM | POA: Diagnosis not present

## 2017-02-23 DIAGNOSIS — D45 Polycythemia vera: Secondary | ICD-10-CM | POA: Diagnosis not present

## 2017-02-23 DIAGNOSIS — I672 Cerebral atherosclerosis: Secondary | ICD-10-CM | POA: Diagnosis not present

## 2017-02-23 DIAGNOSIS — R4182 Altered mental status, unspecified: Secondary | ICD-10-CM | POA: Diagnosis not present

## 2017-03-03 DIAGNOSIS — R4182 Altered mental status, unspecified: Secondary | ICD-10-CM | POA: Diagnosis not present

## 2017-03-03 DIAGNOSIS — H53452 Other localized visual field defect, left eye: Secondary | ICD-10-CM | POA: Diagnosis not present

## 2017-03-03 DIAGNOSIS — I672 Cerebral atherosclerosis: Secondary | ICD-10-CM | POA: Diagnosis not present

## 2017-03-03 DIAGNOSIS — D45 Polycythemia vera: Secondary | ICD-10-CM | POA: Diagnosis not present

## 2017-03-03 DIAGNOSIS — G8102 Flaccid hemiplegia affecting left dominant side: Secondary | ICD-10-CM | POA: Diagnosis not present

## 2017-03-03 DIAGNOSIS — I63331 Cerebral infarction due to thrombosis of right posterior cerebral artery: Secondary | ICD-10-CM | POA: Diagnosis not present

## 2017-03-04 DIAGNOSIS — J301 Allergic rhinitis due to pollen: Secondary | ICD-10-CM | POA: Diagnosis not present

## 2017-03-04 DIAGNOSIS — G8102 Flaccid hemiplegia affecting left dominant side: Secondary | ICD-10-CM | POA: Diagnosis not present

## 2017-03-04 DIAGNOSIS — M1990 Unspecified osteoarthritis, unspecified site: Secondary | ICD-10-CM | POA: Diagnosis not present

## 2017-03-04 DIAGNOSIS — K219 Gastro-esophageal reflux disease without esophagitis: Secondary | ICD-10-CM | POA: Diagnosis not present

## 2017-03-04 DIAGNOSIS — I63331 Cerebral infarction due to thrombosis of right posterior cerebral artery: Secondary | ICD-10-CM | POA: Diagnosis not present

## 2017-03-04 DIAGNOSIS — H53452 Other localized visual field defect, left eye: Secondary | ICD-10-CM | POA: Diagnosis not present

## 2017-03-04 DIAGNOSIS — I672 Cerebral atherosclerosis: Secondary | ICD-10-CM | POA: Diagnosis not present

## 2017-03-04 DIAGNOSIS — E039 Hypothyroidism, unspecified: Secondary | ICD-10-CM | POA: Diagnosis not present

## 2017-03-04 DIAGNOSIS — F339 Major depressive disorder, recurrent, unspecified: Secondary | ICD-10-CM | POA: Diagnosis not present

## 2017-03-04 DIAGNOSIS — R4182 Altered mental status, unspecified: Secondary | ICD-10-CM | POA: Diagnosis not present

## 2017-03-04 DIAGNOSIS — I10 Essential (primary) hypertension: Secondary | ICD-10-CM | POA: Diagnosis not present

## 2017-03-04 DIAGNOSIS — G4089 Other seizures: Secondary | ICD-10-CM | POA: Diagnosis not present

## 2017-03-04 DIAGNOSIS — D45 Polycythemia vera: Secondary | ICD-10-CM | POA: Diagnosis not present

## 2017-03-08 ENCOUNTER — Ambulatory Visit: Payer: Medicare Other | Admitting: Diagnostic Neuroimaging

## 2017-03-09 DIAGNOSIS — H53452 Other localized visual field defect, left eye: Secondary | ICD-10-CM | POA: Diagnosis not present

## 2017-03-09 DIAGNOSIS — I63331 Cerebral infarction due to thrombosis of right posterior cerebral artery: Secondary | ICD-10-CM | POA: Diagnosis not present

## 2017-03-09 DIAGNOSIS — G8102 Flaccid hemiplegia affecting left dominant side: Secondary | ICD-10-CM | POA: Diagnosis not present

## 2017-03-09 DIAGNOSIS — R4182 Altered mental status, unspecified: Secondary | ICD-10-CM | POA: Diagnosis not present

## 2017-03-09 DIAGNOSIS — D45 Polycythemia vera: Secondary | ICD-10-CM | POA: Diagnosis not present

## 2017-03-09 DIAGNOSIS — I672 Cerebral atherosclerosis: Secondary | ICD-10-CM | POA: Diagnosis not present

## 2017-03-14 DIAGNOSIS — H53452 Other localized visual field defect, left eye: Secondary | ICD-10-CM | POA: Diagnosis not present

## 2017-03-14 DIAGNOSIS — G8102 Flaccid hemiplegia affecting left dominant side: Secondary | ICD-10-CM | POA: Diagnosis not present

## 2017-03-14 DIAGNOSIS — D45 Polycythemia vera: Secondary | ICD-10-CM | POA: Diagnosis not present

## 2017-03-14 DIAGNOSIS — R4182 Altered mental status, unspecified: Secondary | ICD-10-CM | POA: Diagnosis not present

## 2017-03-14 DIAGNOSIS — I672 Cerebral atherosclerosis: Secondary | ICD-10-CM | POA: Diagnosis not present

## 2017-03-14 DIAGNOSIS — I63331 Cerebral infarction due to thrombosis of right posterior cerebral artery: Secondary | ICD-10-CM | POA: Diagnosis not present

## 2017-03-16 DIAGNOSIS — R4182 Altered mental status, unspecified: Secondary | ICD-10-CM | POA: Diagnosis not present

## 2017-03-16 DIAGNOSIS — D45 Polycythemia vera: Secondary | ICD-10-CM | POA: Diagnosis not present

## 2017-03-16 DIAGNOSIS — G8102 Flaccid hemiplegia affecting left dominant side: Secondary | ICD-10-CM | POA: Diagnosis not present

## 2017-03-16 DIAGNOSIS — H53452 Other localized visual field defect, left eye: Secondary | ICD-10-CM | POA: Diagnosis not present

## 2017-03-16 DIAGNOSIS — I63331 Cerebral infarction due to thrombosis of right posterior cerebral artery: Secondary | ICD-10-CM | POA: Diagnosis not present

## 2017-03-16 DIAGNOSIS — I672 Cerebral atherosclerosis: Secondary | ICD-10-CM | POA: Diagnosis not present

## 2017-03-23 DIAGNOSIS — I63331 Cerebral infarction due to thrombosis of right posterior cerebral artery: Secondary | ICD-10-CM | POA: Diagnosis not present

## 2017-03-23 DIAGNOSIS — R4182 Altered mental status, unspecified: Secondary | ICD-10-CM | POA: Diagnosis not present

## 2017-03-23 DIAGNOSIS — F329 Major depressive disorder, single episode, unspecified: Secondary | ICD-10-CM | POA: Diagnosis not present

## 2017-03-23 DIAGNOSIS — M1990 Unspecified osteoarthritis, unspecified site: Secondary | ICD-10-CM | POA: Diagnosis not present

## 2017-03-23 DIAGNOSIS — I10 Essential (primary) hypertension: Secondary | ICD-10-CM | POA: Diagnosis not present

## 2017-03-23 DIAGNOSIS — I341 Nonrheumatic mitral (valve) prolapse: Secondary | ICD-10-CM | POA: Diagnosis not present

## 2017-03-23 DIAGNOSIS — H53452 Other localized visual field defect, left eye: Secondary | ICD-10-CM | POA: Diagnosis not present

## 2017-03-23 DIAGNOSIS — D45 Polycythemia vera: Secondary | ICD-10-CM | POA: Diagnosis not present

## 2017-03-23 DIAGNOSIS — G8102 Flaccid hemiplegia affecting left dominant side: Secondary | ICD-10-CM | POA: Diagnosis not present

## 2017-03-23 DIAGNOSIS — I672 Cerebral atherosclerosis: Secondary | ICD-10-CM | POA: Diagnosis not present

## 2017-03-28 DIAGNOSIS — R4182 Altered mental status, unspecified: Secondary | ICD-10-CM | POA: Diagnosis not present

## 2017-03-28 DIAGNOSIS — H53452 Other localized visual field defect, left eye: Secondary | ICD-10-CM | POA: Diagnosis not present

## 2017-03-28 DIAGNOSIS — D45 Polycythemia vera: Secondary | ICD-10-CM | POA: Diagnosis not present

## 2017-03-28 DIAGNOSIS — I63331 Cerebral infarction due to thrombosis of right posterior cerebral artery: Secondary | ICD-10-CM | POA: Diagnosis not present

## 2017-03-28 DIAGNOSIS — G8102 Flaccid hemiplegia affecting left dominant side: Secondary | ICD-10-CM | POA: Diagnosis not present

## 2017-03-28 DIAGNOSIS — I672 Cerebral atherosclerosis: Secondary | ICD-10-CM | POA: Diagnosis not present

## 2017-03-30 DIAGNOSIS — D45 Polycythemia vera: Secondary | ICD-10-CM | POA: Diagnosis not present

## 2017-03-30 DIAGNOSIS — I672 Cerebral atherosclerosis: Secondary | ICD-10-CM | POA: Diagnosis not present

## 2017-03-30 DIAGNOSIS — I63331 Cerebral infarction due to thrombosis of right posterior cerebral artery: Secondary | ICD-10-CM | POA: Diagnosis not present

## 2017-03-30 DIAGNOSIS — R4182 Altered mental status, unspecified: Secondary | ICD-10-CM | POA: Diagnosis not present

## 2017-03-30 DIAGNOSIS — H53452 Other localized visual field defect, left eye: Secondary | ICD-10-CM | POA: Diagnosis not present

## 2017-03-30 DIAGNOSIS — G8102 Flaccid hemiplegia affecting left dominant side: Secondary | ICD-10-CM | POA: Diagnosis not present

## 2017-04-01 DIAGNOSIS — K219 Gastro-esophageal reflux disease without esophagitis: Secondary | ICD-10-CM | POA: Diagnosis not present

## 2017-04-01 DIAGNOSIS — M1990 Unspecified osteoarthritis, unspecified site: Secondary | ICD-10-CM | POA: Diagnosis not present

## 2017-04-01 DIAGNOSIS — G8102 Flaccid hemiplegia affecting left dominant side: Secondary | ICD-10-CM | POA: Diagnosis not present

## 2017-04-01 DIAGNOSIS — R4182 Altered mental status, unspecified: Secondary | ICD-10-CM | POA: Diagnosis not present

## 2017-04-01 DIAGNOSIS — J301 Allergic rhinitis due to pollen: Secondary | ICD-10-CM | POA: Diagnosis not present

## 2017-04-01 DIAGNOSIS — H53452 Other localized visual field defect, left eye: Secondary | ICD-10-CM | POA: Diagnosis not present

## 2017-04-01 DIAGNOSIS — I63331 Cerebral infarction due to thrombosis of right posterior cerebral artery: Secondary | ICD-10-CM | POA: Diagnosis not present

## 2017-04-01 DIAGNOSIS — E039 Hypothyroidism, unspecified: Secondary | ICD-10-CM | POA: Diagnosis not present

## 2017-04-01 DIAGNOSIS — I672 Cerebral atherosclerosis: Secondary | ICD-10-CM | POA: Diagnosis not present

## 2017-04-01 DIAGNOSIS — F339 Major depressive disorder, recurrent, unspecified: Secondary | ICD-10-CM | POA: Diagnosis not present

## 2017-04-01 DIAGNOSIS — D45 Polycythemia vera: Secondary | ICD-10-CM | POA: Diagnosis not present

## 2017-04-01 DIAGNOSIS — I10 Essential (primary) hypertension: Secondary | ICD-10-CM | POA: Diagnosis not present

## 2017-04-01 DIAGNOSIS — G4089 Other seizures: Secondary | ICD-10-CM | POA: Diagnosis not present

## 2017-04-05 DIAGNOSIS — R4182 Altered mental status, unspecified: Secondary | ICD-10-CM | POA: Diagnosis not present

## 2017-04-05 DIAGNOSIS — H53452 Other localized visual field defect, left eye: Secondary | ICD-10-CM | POA: Diagnosis not present

## 2017-04-05 DIAGNOSIS — D45 Polycythemia vera: Secondary | ICD-10-CM | POA: Diagnosis not present

## 2017-04-05 DIAGNOSIS — I63331 Cerebral infarction due to thrombosis of right posterior cerebral artery: Secondary | ICD-10-CM | POA: Diagnosis not present

## 2017-04-05 DIAGNOSIS — I672 Cerebral atherosclerosis: Secondary | ICD-10-CM | POA: Diagnosis not present

## 2017-04-05 DIAGNOSIS — G8102 Flaccid hemiplegia affecting left dominant side: Secondary | ICD-10-CM | POA: Diagnosis not present

## 2017-04-14 DIAGNOSIS — I63331 Cerebral infarction due to thrombosis of right posterior cerebral artery: Secondary | ICD-10-CM | POA: Diagnosis not present

## 2017-04-14 DIAGNOSIS — I672 Cerebral atherosclerosis: Secondary | ICD-10-CM | POA: Diagnosis not present

## 2017-04-14 DIAGNOSIS — D45 Polycythemia vera: Secondary | ICD-10-CM | POA: Diagnosis not present

## 2017-04-14 DIAGNOSIS — R4182 Altered mental status, unspecified: Secondary | ICD-10-CM | POA: Diagnosis not present

## 2017-04-14 DIAGNOSIS — G8102 Flaccid hemiplegia affecting left dominant side: Secondary | ICD-10-CM | POA: Diagnosis not present

## 2017-04-14 DIAGNOSIS — H53452 Other localized visual field defect, left eye: Secondary | ICD-10-CM | POA: Diagnosis not present

## 2017-04-15 DIAGNOSIS — G8102 Flaccid hemiplegia affecting left dominant side: Secondary | ICD-10-CM | POA: Diagnosis not present

## 2017-04-15 DIAGNOSIS — R4182 Altered mental status, unspecified: Secondary | ICD-10-CM | POA: Diagnosis not present

## 2017-04-15 DIAGNOSIS — I63331 Cerebral infarction due to thrombosis of right posterior cerebral artery: Secondary | ICD-10-CM | POA: Diagnosis not present

## 2017-04-15 DIAGNOSIS — D45 Polycythemia vera: Secondary | ICD-10-CM | POA: Diagnosis not present

## 2017-04-15 DIAGNOSIS — H53452 Other localized visual field defect, left eye: Secondary | ICD-10-CM | POA: Diagnosis not present

## 2017-04-15 DIAGNOSIS — I672 Cerebral atherosclerosis: Secondary | ICD-10-CM | POA: Diagnosis not present

## 2017-04-21 DIAGNOSIS — I63331 Cerebral infarction due to thrombosis of right posterior cerebral artery: Secondary | ICD-10-CM | POA: Diagnosis not present

## 2017-04-21 DIAGNOSIS — D45 Polycythemia vera: Secondary | ICD-10-CM | POA: Diagnosis not present

## 2017-04-21 DIAGNOSIS — H53452 Other localized visual field defect, left eye: Secondary | ICD-10-CM | POA: Diagnosis not present

## 2017-04-21 DIAGNOSIS — I672 Cerebral atherosclerosis: Secondary | ICD-10-CM | POA: Diagnosis not present

## 2017-04-21 DIAGNOSIS — R4182 Altered mental status, unspecified: Secondary | ICD-10-CM | POA: Diagnosis not present

## 2017-04-21 DIAGNOSIS — G8102 Flaccid hemiplegia affecting left dominant side: Secondary | ICD-10-CM | POA: Diagnosis not present

## 2017-04-28 DIAGNOSIS — I63331 Cerebral infarction due to thrombosis of right posterior cerebral artery: Secondary | ICD-10-CM | POA: Diagnosis not present

## 2017-04-28 DIAGNOSIS — R4182 Altered mental status, unspecified: Secondary | ICD-10-CM | POA: Diagnosis not present

## 2017-04-28 DIAGNOSIS — I672 Cerebral atherosclerosis: Secondary | ICD-10-CM | POA: Diagnosis not present

## 2017-04-28 DIAGNOSIS — G8102 Flaccid hemiplegia affecting left dominant side: Secondary | ICD-10-CM | POA: Diagnosis not present

## 2017-04-28 DIAGNOSIS — D45 Polycythemia vera: Secondary | ICD-10-CM | POA: Diagnosis not present

## 2017-04-28 DIAGNOSIS — H53452 Other localized visual field defect, left eye: Secondary | ICD-10-CM | POA: Diagnosis not present

## 2017-05-02 DIAGNOSIS — G4089 Other seizures: Secondary | ICD-10-CM | POA: Diagnosis not present

## 2017-05-02 DIAGNOSIS — H53452 Other localized visual field defect, left eye: Secondary | ICD-10-CM | POA: Diagnosis not present

## 2017-05-02 DIAGNOSIS — G8102 Flaccid hemiplegia affecting left dominant side: Secondary | ICD-10-CM | POA: Diagnosis not present

## 2017-05-02 DIAGNOSIS — J301 Allergic rhinitis due to pollen: Secondary | ICD-10-CM | POA: Diagnosis not present

## 2017-05-02 DIAGNOSIS — I10 Essential (primary) hypertension: Secondary | ICD-10-CM | POA: Diagnosis not present

## 2017-05-02 DIAGNOSIS — I63331 Cerebral infarction due to thrombosis of right posterior cerebral artery: Secondary | ICD-10-CM | POA: Diagnosis not present

## 2017-05-02 DIAGNOSIS — M1990 Unspecified osteoarthritis, unspecified site: Secondary | ICD-10-CM | POA: Diagnosis not present

## 2017-05-02 DIAGNOSIS — F339 Major depressive disorder, recurrent, unspecified: Secondary | ICD-10-CM | POA: Diagnosis not present

## 2017-05-02 DIAGNOSIS — K219 Gastro-esophageal reflux disease without esophagitis: Secondary | ICD-10-CM | POA: Diagnosis not present

## 2017-05-02 DIAGNOSIS — D45 Polycythemia vera: Secondary | ICD-10-CM | POA: Diagnosis not present

## 2017-05-02 DIAGNOSIS — E039 Hypothyroidism, unspecified: Secondary | ICD-10-CM | POA: Diagnosis not present

## 2017-05-02 DIAGNOSIS — I672 Cerebral atherosclerosis: Secondary | ICD-10-CM | POA: Diagnosis not present

## 2017-05-02 DIAGNOSIS — R4182 Altered mental status, unspecified: Secondary | ICD-10-CM | POA: Diagnosis not present

## 2017-05-05 DIAGNOSIS — I63331 Cerebral infarction due to thrombosis of right posterior cerebral artery: Secondary | ICD-10-CM | POA: Diagnosis not present

## 2017-05-05 DIAGNOSIS — I672 Cerebral atherosclerosis: Secondary | ICD-10-CM | POA: Diagnosis not present

## 2017-05-05 DIAGNOSIS — H53452 Other localized visual field defect, left eye: Secondary | ICD-10-CM | POA: Diagnosis not present

## 2017-05-05 DIAGNOSIS — D45 Polycythemia vera: Secondary | ICD-10-CM | POA: Diagnosis not present

## 2017-05-05 DIAGNOSIS — G8102 Flaccid hemiplegia affecting left dominant side: Secondary | ICD-10-CM | POA: Diagnosis not present

## 2017-05-05 DIAGNOSIS — R4182 Altered mental status, unspecified: Secondary | ICD-10-CM | POA: Diagnosis not present

## 2017-05-06 DIAGNOSIS — I672 Cerebral atherosclerosis: Secondary | ICD-10-CM | POA: Diagnosis not present

## 2017-05-06 DIAGNOSIS — G8102 Flaccid hemiplegia affecting left dominant side: Secondary | ICD-10-CM | POA: Diagnosis not present

## 2017-05-06 DIAGNOSIS — I63331 Cerebral infarction due to thrombosis of right posterior cerebral artery: Secondary | ICD-10-CM | POA: Diagnosis not present

## 2017-05-06 DIAGNOSIS — D45 Polycythemia vera: Secondary | ICD-10-CM | POA: Diagnosis not present

## 2017-05-06 DIAGNOSIS — R4182 Altered mental status, unspecified: Secondary | ICD-10-CM | POA: Diagnosis not present

## 2017-05-06 DIAGNOSIS — H53452 Other localized visual field defect, left eye: Secondary | ICD-10-CM | POA: Diagnosis not present

## 2017-05-12 DIAGNOSIS — R4182 Altered mental status, unspecified: Secondary | ICD-10-CM | POA: Diagnosis not present

## 2017-05-12 DIAGNOSIS — D45 Polycythemia vera: Secondary | ICD-10-CM | POA: Diagnosis not present

## 2017-05-12 DIAGNOSIS — I672 Cerebral atherosclerosis: Secondary | ICD-10-CM | POA: Diagnosis not present

## 2017-05-12 DIAGNOSIS — H53452 Other localized visual field defect, left eye: Secondary | ICD-10-CM | POA: Diagnosis not present

## 2017-05-12 DIAGNOSIS — I63331 Cerebral infarction due to thrombosis of right posterior cerebral artery: Secondary | ICD-10-CM | POA: Diagnosis not present

## 2017-05-12 DIAGNOSIS — G8102 Flaccid hemiplegia affecting left dominant side: Secondary | ICD-10-CM | POA: Diagnosis not present

## 2017-05-16 DIAGNOSIS — H53452 Other localized visual field defect, left eye: Secondary | ICD-10-CM | POA: Diagnosis not present

## 2017-05-16 DIAGNOSIS — G8102 Flaccid hemiplegia affecting left dominant side: Secondary | ICD-10-CM | POA: Diagnosis not present

## 2017-05-16 DIAGNOSIS — I672 Cerebral atherosclerosis: Secondary | ICD-10-CM | POA: Diagnosis not present

## 2017-05-16 DIAGNOSIS — I63331 Cerebral infarction due to thrombosis of right posterior cerebral artery: Secondary | ICD-10-CM | POA: Diagnosis not present

## 2017-05-16 DIAGNOSIS — D45 Polycythemia vera: Secondary | ICD-10-CM | POA: Diagnosis not present

## 2017-05-16 DIAGNOSIS — R4182 Altered mental status, unspecified: Secondary | ICD-10-CM | POA: Diagnosis not present

## 2017-05-17 DIAGNOSIS — D45 Polycythemia vera: Secondary | ICD-10-CM | POA: Diagnosis not present

## 2017-05-17 DIAGNOSIS — G8102 Flaccid hemiplegia affecting left dominant side: Secondary | ICD-10-CM | POA: Diagnosis not present

## 2017-05-17 DIAGNOSIS — H53452 Other localized visual field defect, left eye: Secondary | ICD-10-CM | POA: Diagnosis not present

## 2017-05-17 DIAGNOSIS — I63331 Cerebral infarction due to thrombosis of right posterior cerebral artery: Secondary | ICD-10-CM | POA: Diagnosis not present

## 2017-05-17 DIAGNOSIS — R4182 Altered mental status, unspecified: Secondary | ICD-10-CM | POA: Diagnosis not present

## 2017-05-17 DIAGNOSIS — I672 Cerebral atherosclerosis: Secondary | ICD-10-CM | POA: Diagnosis not present

## 2017-05-25 DIAGNOSIS — I672 Cerebral atherosclerosis: Secondary | ICD-10-CM | POA: Diagnosis not present

## 2017-05-25 DIAGNOSIS — H53452 Other localized visual field defect, left eye: Secondary | ICD-10-CM | POA: Diagnosis not present

## 2017-05-25 DIAGNOSIS — G8102 Flaccid hemiplegia affecting left dominant side: Secondary | ICD-10-CM | POA: Diagnosis not present

## 2017-05-25 DIAGNOSIS — D45 Polycythemia vera: Secondary | ICD-10-CM | POA: Diagnosis not present

## 2017-05-25 DIAGNOSIS — R4182 Altered mental status, unspecified: Secondary | ICD-10-CM | POA: Diagnosis not present

## 2017-05-25 DIAGNOSIS — I63331 Cerebral infarction due to thrombosis of right posterior cerebral artery: Secondary | ICD-10-CM | POA: Diagnosis not present

## 2017-06-01 DIAGNOSIS — I10 Essential (primary) hypertension: Secondary | ICD-10-CM | POA: Diagnosis not present

## 2017-06-01 DIAGNOSIS — R4182 Altered mental status, unspecified: Secondary | ICD-10-CM | POA: Diagnosis not present

## 2017-06-01 DIAGNOSIS — M1990 Unspecified osteoarthritis, unspecified site: Secondary | ICD-10-CM | POA: Diagnosis not present

## 2017-06-01 DIAGNOSIS — F339 Major depressive disorder, recurrent, unspecified: Secondary | ICD-10-CM | POA: Diagnosis not present

## 2017-06-01 DIAGNOSIS — I63331 Cerebral infarction due to thrombosis of right posterior cerebral artery: Secondary | ICD-10-CM | POA: Diagnosis not present

## 2017-06-01 DIAGNOSIS — D45 Polycythemia vera: Secondary | ICD-10-CM | POA: Diagnosis not present

## 2017-06-01 DIAGNOSIS — G4089 Other seizures: Secondary | ICD-10-CM | POA: Diagnosis not present

## 2017-06-01 DIAGNOSIS — H53452 Other localized visual field defect, left eye: Secondary | ICD-10-CM | POA: Diagnosis not present

## 2017-06-01 DIAGNOSIS — G8102 Flaccid hemiplegia affecting left dominant side: Secondary | ICD-10-CM | POA: Diagnosis not present

## 2017-06-01 DIAGNOSIS — E039 Hypothyroidism, unspecified: Secondary | ICD-10-CM | POA: Diagnosis not present

## 2017-06-01 DIAGNOSIS — J301 Allergic rhinitis due to pollen: Secondary | ICD-10-CM | POA: Diagnosis not present

## 2017-06-01 DIAGNOSIS — K219 Gastro-esophageal reflux disease without esophagitis: Secondary | ICD-10-CM | POA: Diagnosis not present

## 2017-06-01 DIAGNOSIS — I672 Cerebral atherosclerosis: Secondary | ICD-10-CM | POA: Diagnosis not present

## 2017-06-06 DIAGNOSIS — R4182 Altered mental status, unspecified: Secondary | ICD-10-CM | POA: Diagnosis not present

## 2017-06-06 DIAGNOSIS — D45 Polycythemia vera: Secondary | ICD-10-CM | POA: Diagnosis not present

## 2017-06-06 DIAGNOSIS — I63331 Cerebral infarction due to thrombosis of right posterior cerebral artery: Secondary | ICD-10-CM | POA: Diagnosis not present

## 2017-06-06 DIAGNOSIS — I672 Cerebral atherosclerosis: Secondary | ICD-10-CM | POA: Diagnosis not present

## 2017-06-06 DIAGNOSIS — H53452 Other localized visual field defect, left eye: Secondary | ICD-10-CM | POA: Diagnosis not present

## 2017-06-06 DIAGNOSIS — G8102 Flaccid hemiplegia affecting left dominant side: Secondary | ICD-10-CM | POA: Diagnosis not present

## 2017-06-08 DIAGNOSIS — D45 Polycythemia vera: Secondary | ICD-10-CM | POA: Diagnosis not present

## 2017-06-08 DIAGNOSIS — I672 Cerebral atherosclerosis: Secondary | ICD-10-CM | POA: Diagnosis not present

## 2017-06-08 DIAGNOSIS — H53452 Other localized visual field defect, left eye: Secondary | ICD-10-CM | POA: Diagnosis not present

## 2017-06-08 DIAGNOSIS — G8102 Flaccid hemiplegia affecting left dominant side: Secondary | ICD-10-CM | POA: Diagnosis not present

## 2017-06-08 DIAGNOSIS — R4182 Altered mental status, unspecified: Secondary | ICD-10-CM | POA: Diagnosis not present

## 2017-06-08 DIAGNOSIS — I63331 Cerebral infarction due to thrombosis of right posterior cerebral artery: Secondary | ICD-10-CM | POA: Diagnosis not present

## 2017-06-13 ENCOUNTER — Telehealth: Payer: Self-pay | Admitting: Cardiovascular Disease

## 2017-06-13 ENCOUNTER — Telehealth: Payer: Self-pay | Admitting: Neurology

## 2017-06-13 ENCOUNTER — Other Ambulatory Visit: Payer: Self-pay | Admitting: Neurology

## 2017-06-13 DIAGNOSIS — I63331 Cerebral infarction due to thrombosis of right posterior cerebral artery: Secondary | ICD-10-CM | POA: Diagnosis not present

## 2017-06-13 DIAGNOSIS — D45 Polycythemia vera: Secondary | ICD-10-CM | POA: Diagnosis not present

## 2017-06-13 DIAGNOSIS — R4182 Altered mental status, unspecified: Secondary | ICD-10-CM | POA: Diagnosis not present

## 2017-06-13 DIAGNOSIS — I672 Cerebral atherosclerosis: Secondary | ICD-10-CM | POA: Diagnosis not present

## 2017-06-13 DIAGNOSIS — H53452 Other localized visual field defect, left eye: Secondary | ICD-10-CM | POA: Diagnosis not present

## 2017-06-13 DIAGNOSIS — G8102 Flaccid hemiplegia affecting left dominant side: Secondary | ICD-10-CM | POA: Diagnosis not present

## 2017-06-13 DIAGNOSIS — G40909 Epilepsy, unspecified, not intractable, without status epilepticus: Secondary | ICD-10-CM

## 2017-06-13 MED ORDER — LEVETIRACETAM 100 MG/ML PO SOLN: 500.0000 mg | Freq: Two times a day (BID) | ORAL | Status: DC

## 2017-06-13 NOTE — Telephone Encounter (Signed)
DR. Leonie Man I can call patients daughter back please advise. See note below thanks.

## 2017-06-13 NOTE — Telephone Encounter (Signed)
°  Pt c/o medication issue:  1. Name of Medication:  metoprolol succinate (TOPROL-XL) 50 MG 24 hr tablet TAKE 1 TABLET ONCE DAILY WITH FOOD.   2. How are you currently taking this medication (dosage and times per day)? TAKE 1 TABLET ONCE DAILY WITH FOOD.  3. Are you having a reaction (difficulty breathing--STAT)? no  4. What is your medication issue? Pt daughter verbalized that she is afraid that she will choke again   Pt cant sit up or hold her head up on her own..  Should pt keep taking the medication? If so do you want her to crush the medications, or can we try another medication

## 2017-06-13 NOTE — Telephone Encounter (Signed)
Rn call [name] that DR. SEthi change medications to liquid keppra. [name] stated pt is in hospice care and has 24 hour care.  [name] verbalized understanding, and will try liquid keppra for her mom.

## 2017-06-13 NOTE — Telephone Encounter (Signed)
Spoke with patient's daughter, [name], who states patient is immobile and has difficulty swallowing, states she does not sit up on her own. She states patient got choked on her Toprol last night and wants to know if Toprol can be crushed. I advised her that Toprol is not supposed to be crushed and asked about recent BP and pulse readings. She reports BP is well controlled and is usually approximately 291-916 mmHg systolic. She states she has not been paying attention to patient's HR. [name] reports frustration that hospice has not given her guidance on patient's medications and that hospice care is managed by Dr. Pennie Banter office, but she was advised to contact the individual doctors for guidance on stopping patient's medications. I advised that likely Dr. Acie Fredrickson will advise her to d/c patient's Benicar and Toprol but that I will route to Dr. Acie Fredrickson for advice. [name] verbalized understanding and agreement and thanked me for the call.

## 2017-06-13 NOTE — Telephone Encounter (Signed)
Patient's daughter is calling stating her mother's condition has deteriorated and wants to know if she should continue taking  Keppra. The pill is so big and hard for her to swallow. She says patient is under Hospice care.

## 2017-06-13 NOTE — Telephone Encounter (Signed)
I will change the Keppra tablet to Keppra liquid if she is able to swallow that

## 2017-06-14 ENCOUNTER — Other Ambulatory Visit: Payer: Self-pay

## 2017-06-14 MED ORDER — LEVETIRACETAM 100 MG/ML PO SOLN: 500.0000 mg | Freq: Two times a day (BID) | ORAL | 12 refills | Status: AC

## 2017-06-14 NOTE — Telephone Encounter (Signed)
Agree with DC of Toprol and Benicar

## 2017-06-14 NOTE — Telephone Encounter (Signed)
Patient's daughter calling stating [facility] does not have Rx for levETIRAcetam (KEPPRA) 100 MG/ML solution 500 mg.

## 2017-06-14 NOTE — Telephone Encounter (Signed)
Rn call pharmacy to resend keppra liquid.Rn spoke with pharmacy tech,and pt will need to have 362ml as the quantity dosage, and year of refills. Order redone,and pharmacy receive the new order.

## 2017-06-15 DIAGNOSIS — R4182 Altered mental status, unspecified: Secondary | ICD-10-CM | POA: Diagnosis not present

## 2017-06-15 DIAGNOSIS — D45 Polycythemia vera: Secondary | ICD-10-CM | POA: Diagnosis not present

## 2017-06-15 DIAGNOSIS — H53452 Other localized visual field defect, left eye: Secondary | ICD-10-CM | POA: Diagnosis not present

## 2017-06-15 DIAGNOSIS — I672 Cerebral atherosclerosis: Secondary | ICD-10-CM | POA: Diagnosis not present

## 2017-06-15 DIAGNOSIS — I63331 Cerebral infarction due to thrombosis of right posterior cerebral artery: Secondary | ICD-10-CM | POA: Diagnosis not present

## 2017-06-15 DIAGNOSIS — G8102 Flaccid hemiplegia affecting left dominant side: Secondary | ICD-10-CM | POA: Diagnosis not present

## 2017-06-15 NOTE — Telephone Encounter (Signed)
Left message for patient's daughter, [name], with Dr. Elmarie Shiley advice to stop Toprol and Benicar. I advised her to call back with additional questions or concerns.

## 2017-06-22 DIAGNOSIS — R4182 Altered mental status, unspecified: Secondary | ICD-10-CM | POA: Diagnosis not present

## 2017-06-22 DIAGNOSIS — H53452 Other localized visual field defect, left eye: Secondary | ICD-10-CM | POA: Diagnosis not present

## 2017-06-22 DIAGNOSIS — I63331 Cerebral infarction due to thrombosis of right posterior cerebral artery: Secondary | ICD-10-CM | POA: Diagnosis not present

## 2017-06-22 DIAGNOSIS — G8102 Flaccid hemiplegia affecting left dominant side: Secondary | ICD-10-CM | POA: Diagnosis not present

## 2017-06-22 DIAGNOSIS — D45 Polycythemia vera: Secondary | ICD-10-CM | POA: Diagnosis not present

## 2017-06-22 DIAGNOSIS — I672 Cerebral atherosclerosis: Secondary | ICD-10-CM | POA: Diagnosis not present

## 2017-06-29 DIAGNOSIS — R4182 Altered mental status, unspecified: Secondary | ICD-10-CM | POA: Diagnosis not present

## 2017-06-29 DIAGNOSIS — I63331 Cerebral infarction due to thrombosis of right posterior cerebral artery: Secondary | ICD-10-CM | POA: Diagnosis not present

## 2017-06-29 DIAGNOSIS — D45 Polycythemia vera: Secondary | ICD-10-CM | POA: Diagnosis not present

## 2017-06-29 DIAGNOSIS — G8102 Flaccid hemiplegia affecting left dominant side: Secondary | ICD-10-CM | POA: Diagnosis not present

## 2017-06-29 DIAGNOSIS — I672 Cerebral atherosclerosis: Secondary | ICD-10-CM | POA: Diagnosis not present

## 2017-06-29 DIAGNOSIS — H53452 Other localized visual field defect, left eye: Secondary | ICD-10-CM | POA: Diagnosis not present

## 2017-07-02 DIAGNOSIS — H53452 Other localized visual field defect, left eye: Secondary | ICD-10-CM | POA: Diagnosis not present

## 2017-07-02 DIAGNOSIS — G4089 Other seizures: Secondary | ICD-10-CM | POA: Diagnosis not present

## 2017-07-02 DIAGNOSIS — I63331 Cerebral infarction due to thrombosis of right posterior cerebral artery: Secondary | ICD-10-CM | POA: Diagnosis not present

## 2017-07-02 DIAGNOSIS — R4182 Altered mental status, unspecified: Secondary | ICD-10-CM | POA: Diagnosis not present

## 2017-07-02 DIAGNOSIS — F339 Major depressive disorder, recurrent, unspecified: Secondary | ICD-10-CM | POA: Diagnosis not present

## 2017-07-02 DIAGNOSIS — D45 Polycythemia vera: Secondary | ICD-10-CM | POA: Diagnosis not present

## 2017-07-02 DIAGNOSIS — I672 Cerebral atherosclerosis: Secondary | ICD-10-CM | POA: Diagnosis not present

## 2017-07-02 DIAGNOSIS — I10 Essential (primary) hypertension: Secondary | ICD-10-CM | POA: Diagnosis not present

## 2017-07-02 DIAGNOSIS — G8102 Flaccid hemiplegia affecting left dominant side: Secondary | ICD-10-CM | POA: Diagnosis not present

## 2017-07-02 DIAGNOSIS — K219 Gastro-esophageal reflux disease without esophagitis: Secondary | ICD-10-CM | POA: Diagnosis not present

## 2017-07-02 DIAGNOSIS — E039 Hypothyroidism, unspecified: Secondary | ICD-10-CM | POA: Diagnosis not present

## 2017-07-02 DIAGNOSIS — M1990 Unspecified osteoarthritis, unspecified site: Secondary | ICD-10-CM | POA: Diagnosis not present

## 2017-07-02 DIAGNOSIS — J301 Allergic rhinitis due to pollen: Secondary | ICD-10-CM | POA: Diagnosis not present

## 2017-07-04 DIAGNOSIS — R4182 Altered mental status, unspecified: Secondary | ICD-10-CM | POA: Diagnosis not present

## 2017-07-04 DIAGNOSIS — H53452 Other localized visual field defect, left eye: Secondary | ICD-10-CM | POA: Diagnosis not present

## 2017-07-04 DIAGNOSIS — I672 Cerebral atherosclerosis: Secondary | ICD-10-CM | POA: Diagnosis not present

## 2017-07-04 DIAGNOSIS — I63331 Cerebral infarction due to thrombosis of right posterior cerebral artery: Secondary | ICD-10-CM | POA: Diagnosis not present

## 2017-07-04 DIAGNOSIS — G8102 Flaccid hemiplegia affecting left dominant side: Secondary | ICD-10-CM | POA: Diagnosis not present

## 2017-07-04 DIAGNOSIS — D45 Polycythemia vera: Secondary | ICD-10-CM | POA: Diagnosis not present

## 2017-07-07 DIAGNOSIS — G8102 Flaccid hemiplegia affecting left dominant side: Secondary | ICD-10-CM | POA: Diagnosis not present

## 2017-07-07 DIAGNOSIS — I63331 Cerebral infarction due to thrombosis of right posterior cerebral artery: Secondary | ICD-10-CM | POA: Diagnosis not present

## 2017-07-07 DIAGNOSIS — D45 Polycythemia vera: Secondary | ICD-10-CM | POA: Diagnosis not present

## 2017-07-07 DIAGNOSIS — R4182 Altered mental status, unspecified: Secondary | ICD-10-CM | POA: Diagnosis not present

## 2017-07-07 DIAGNOSIS — H53452 Other localized visual field defect, left eye: Secondary | ICD-10-CM | POA: Diagnosis not present

## 2017-07-07 DIAGNOSIS — I672 Cerebral atherosclerosis: Secondary | ICD-10-CM | POA: Diagnosis not present

## 2017-07-13 DIAGNOSIS — I672 Cerebral atherosclerosis: Secondary | ICD-10-CM | POA: Diagnosis not present

## 2017-07-13 DIAGNOSIS — H53452 Other localized visual field defect, left eye: Secondary | ICD-10-CM | POA: Diagnosis not present

## 2017-07-13 DIAGNOSIS — R4182 Altered mental status, unspecified: Secondary | ICD-10-CM | POA: Diagnosis not present

## 2017-07-13 DIAGNOSIS — G8102 Flaccid hemiplegia affecting left dominant side: Secondary | ICD-10-CM | POA: Diagnosis not present

## 2017-07-13 DIAGNOSIS — D45 Polycythemia vera: Secondary | ICD-10-CM | POA: Diagnosis not present

## 2017-07-13 DIAGNOSIS — I63331 Cerebral infarction due to thrombosis of right posterior cerebral artery: Secondary | ICD-10-CM | POA: Diagnosis not present

## 2017-07-15 DIAGNOSIS — I672 Cerebral atherosclerosis: Secondary | ICD-10-CM | POA: Diagnosis not present

## 2017-07-15 DIAGNOSIS — G8102 Flaccid hemiplegia affecting left dominant side: Secondary | ICD-10-CM | POA: Diagnosis not present

## 2017-07-15 DIAGNOSIS — I63331 Cerebral infarction due to thrombosis of right posterior cerebral artery: Secondary | ICD-10-CM | POA: Diagnosis not present

## 2017-07-15 DIAGNOSIS — D45 Polycythemia vera: Secondary | ICD-10-CM | POA: Diagnosis not present

## 2017-07-15 DIAGNOSIS — H53452 Other localized visual field defect, left eye: Secondary | ICD-10-CM | POA: Diagnosis not present

## 2017-07-15 DIAGNOSIS — R4182 Altered mental status, unspecified: Secondary | ICD-10-CM | POA: Diagnosis not present

## 2017-07-18 DIAGNOSIS — H53452 Other localized visual field defect, left eye: Secondary | ICD-10-CM | POA: Diagnosis not present

## 2017-07-18 DIAGNOSIS — I63331 Cerebral infarction due to thrombosis of right posterior cerebral artery: Secondary | ICD-10-CM | POA: Diagnosis not present

## 2017-07-18 DIAGNOSIS — D45 Polycythemia vera: Secondary | ICD-10-CM | POA: Diagnosis not present

## 2017-07-18 DIAGNOSIS — I672 Cerebral atherosclerosis: Secondary | ICD-10-CM | POA: Diagnosis not present

## 2017-07-18 DIAGNOSIS — R4182 Altered mental status, unspecified: Secondary | ICD-10-CM | POA: Diagnosis not present

## 2017-07-18 DIAGNOSIS — G8102 Flaccid hemiplegia affecting left dominant side: Secondary | ICD-10-CM | POA: Diagnosis not present

## 2017-07-19 DIAGNOSIS — G8102 Flaccid hemiplegia affecting left dominant side: Secondary | ICD-10-CM | POA: Diagnosis not present

## 2017-07-19 DIAGNOSIS — I63331 Cerebral infarction due to thrombosis of right posterior cerebral artery: Secondary | ICD-10-CM | POA: Diagnosis not present

## 2017-07-19 DIAGNOSIS — D45 Polycythemia vera: Secondary | ICD-10-CM | POA: Diagnosis not present

## 2017-07-19 DIAGNOSIS — R4182 Altered mental status, unspecified: Secondary | ICD-10-CM | POA: Diagnosis not present

## 2017-07-19 DIAGNOSIS — H53452 Other localized visual field defect, left eye: Secondary | ICD-10-CM | POA: Diagnosis not present

## 2017-07-19 DIAGNOSIS — I672 Cerebral atherosclerosis: Secondary | ICD-10-CM | POA: Diagnosis not present

## 2017-07-27 DIAGNOSIS — D45 Polycythemia vera: Secondary | ICD-10-CM | POA: Diagnosis not present

## 2017-07-27 DIAGNOSIS — R4182 Altered mental status, unspecified: Secondary | ICD-10-CM | POA: Diagnosis not present

## 2017-07-27 DIAGNOSIS — I672 Cerebral atherosclerosis: Secondary | ICD-10-CM | POA: Diagnosis not present

## 2017-07-27 DIAGNOSIS — G8102 Flaccid hemiplegia affecting left dominant side: Secondary | ICD-10-CM | POA: Diagnosis not present

## 2017-07-27 DIAGNOSIS — H53452 Other localized visual field defect, left eye: Secondary | ICD-10-CM | POA: Diagnosis not present

## 2017-07-27 DIAGNOSIS — I63331 Cerebral infarction due to thrombosis of right posterior cerebral artery: Secondary | ICD-10-CM | POA: Diagnosis not present

## 2017-08-01 DIAGNOSIS — K219 Gastro-esophageal reflux disease without esophagitis: Secondary | ICD-10-CM | POA: Diagnosis not present

## 2017-08-01 DIAGNOSIS — E039 Hypothyroidism, unspecified: Secondary | ICD-10-CM | POA: Diagnosis not present

## 2017-08-01 DIAGNOSIS — H53452 Other localized visual field defect, left eye: Secondary | ICD-10-CM | POA: Diagnosis not present

## 2017-08-01 DIAGNOSIS — D45 Polycythemia vera: Secondary | ICD-10-CM | POA: Diagnosis not present

## 2017-08-01 DIAGNOSIS — I63331 Cerebral infarction due to thrombosis of right posterior cerebral artery: Secondary | ICD-10-CM | POA: Diagnosis not present

## 2017-08-01 DIAGNOSIS — I10 Essential (primary) hypertension: Secondary | ICD-10-CM | POA: Diagnosis not present

## 2017-08-01 DIAGNOSIS — I672 Cerebral atherosclerosis: Secondary | ICD-10-CM | POA: Diagnosis not present

## 2017-08-01 DIAGNOSIS — G4089 Other seizures: Secondary | ICD-10-CM | POA: Diagnosis not present

## 2017-08-01 DIAGNOSIS — G8102 Flaccid hemiplegia affecting left dominant side: Secondary | ICD-10-CM | POA: Diagnosis not present

## 2017-08-01 DIAGNOSIS — R4182 Altered mental status, unspecified: Secondary | ICD-10-CM | POA: Diagnosis not present

## 2017-08-01 DIAGNOSIS — F339 Major depressive disorder, recurrent, unspecified: Secondary | ICD-10-CM | POA: Diagnosis not present

## 2017-08-01 DIAGNOSIS — M1990 Unspecified osteoarthritis, unspecified site: Secondary | ICD-10-CM | POA: Diagnosis not present

## 2017-08-01 DIAGNOSIS — J301 Allergic rhinitis due to pollen: Secondary | ICD-10-CM | POA: Diagnosis not present

## 2017-08-03 DIAGNOSIS — G8102 Flaccid hemiplegia affecting left dominant side: Secondary | ICD-10-CM | POA: Diagnosis not present

## 2017-08-03 DIAGNOSIS — I672 Cerebral atherosclerosis: Secondary | ICD-10-CM | POA: Diagnosis not present

## 2017-08-03 DIAGNOSIS — H53452 Other localized visual field defect, left eye: Secondary | ICD-10-CM | POA: Diagnosis not present

## 2017-08-03 DIAGNOSIS — D45 Polycythemia vera: Secondary | ICD-10-CM | POA: Diagnosis not present

## 2017-08-03 DIAGNOSIS — R4182 Altered mental status, unspecified: Secondary | ICD-10-CM | POA: Diagnosis not present

## 2017-08-03 DIAGNOSIS — I63331 Cerebral infarction due to thrombosis of right posterior cerebral artery: Secondary | ICD-10-CM | POA: Diagnosis not present

## 2017-08-10 DIAGNOSIS — R4182 Altered mental status, unspecified: Secondary | ICD-10-CM | POA: Diagnosis not present

## 2017-08-10 DIAGNOSIS — I63331 Cerebral infarction due to thrombosis of right posterior cerebral artery: Secondary | ICD-10-CM | POA: Diagnosis not present

## 2017-08-10 DIAGNOSIS — I672 Cerebral atherosclerosis: Secondary | ICD-10-CM | POA: Diagnosis not present

## 2017-08-10 DIAGNOSIS — H53452 Other localized visual field defect, left eye: Secondary | ICD-10-CM | POA: Diagnosis not present

## 2017-08-10 DIAGNOSIS — G8102 Flaccid hemiplegia affecting left dominant side: Secondary | ICD-10-CM | POA: Diagnosis not present

## 2017-08-10 DIAGNOSIS — D45 Polycythemia vera: Secondary | ICD-10-CM | POA: Diagnosis not present

## 2017-08-14 DIAGNOSIS — G8102 Flaccid hemiplegia affecting left dominant side: Secondary | ICD-10-CM | POA: Diagnosis not present

## 2017-08-14 DIAGNOSIS — H53452 Other localized visual field defect, left eye: Secondary | ICD-10-CM | POA: Diagnosis not present

## 2017-08-14 DIAGNOSIS — I63331 Cerebral infarction due to thrombosis of right posterior cerebral artery: Secondary | ICD-10-CM | POA: Diagnosis not present

## 2017-08-14 DIAGNOSIS — R4182 Altered mental status, unspecified: Secondary | ICD-10-CM | POA: Diagnosis not present

## 2017-08-14 DIAGNOSIS — I672 Cerebral atherosclerosis: Secondary | ICD-10-CM | POA: Diagnosis not present

## 2017-08-14 DIAGNOSIS — D45 Polycythemia vera: Secondary | ICD-10-CM | POA: Diagnosis not present

## 2017-08-15 DIAGNOSIS — H53452 Other localized visual field defect, left eye: Secondary | ICD-10-CM | POA: Diagnosis not present

## 2017-08-15 DIAGNOSIS — R4182 Altered mental status, unspecified: Secondary | ICD-10-CM | POA: Diagnosis not present

## 2017-08-15 DIAGNOSIS — I63331 Cerebral infarction due to thrombosis of right posterior cerebral artery: Secondary | ICD-10-CM | POA: Diagnosis not present

## 2017-08-15 DIAGNOSIS — I672 Cerebral atherosclerosis: Secondary | ICD-10-CM | POA: Diagnosis not present

## 2017-08-15 DIAGNOSIS — G8102 Flaccid hemiplegia affecting left dominant side: Secondary | ICD-10-CM | POA: Diagnosis not present

## 2017-08-15 DIAGNOSIS — D45 Polycythemia vera: Secondary | ICD-10-CM | POA: Diagnosis not present

## 2017-08-17 DIAGNOSIS — I672 Cerebral atherosclerosis: Secondary | ICD-10-CM | POA: Diagnosis not present

## 2017-08-17 DIAGNOSIS — D45 Polycythemia vera: Secondary | ICD-10-CM | POA: Diagnosis not present

## 2017-08-17 DIAGNOSIS — H53452 Other localized visual field defect, left eye: Secondary | ICD-10-CM | POA: Diagnosis not present

## 2017-08-17 DIAGNOSIS — R4182 Altered mental status, unspecified: Secondary | ICD-10-CM | POA: Diagnosis not present

## 2017-08-17 DIAGNOSIS — G8102 Flaccid hemiplegia affecting left dominant side: Secondary | ICD-10-CM | POA: Diagnosis not present

## 2017-08-17 DIAGNOSIS — I63331 Cerebral infarction due to thrombosis of right posterior cerebral artery: Secondary | ICD-10-CM | POA: Diagnosis not present

## 2017-08-24 DIAGNOSIS — I672 Cerebral atherosclerosis: Secondary | ICD-10-CM | POA: Diagnosis not present

## 2017-08-24 DIAGNOSIS — D45 Polycythemia vera: Secondary | ICD-10-CM | POA: Diagnosis not present

## 2017-08-24 DIAGNOSIS — G8102 Flaccid hemiplegia affecting left dominant side: Secondary | ICD-10-CM | POA: Diagnosis not present

## 2017-08-24 DIAGNOSIS — I63331 Cerebral infarction due to thrombosis of right posterior cerebral artery: Secondary | ICD-10-CM | POA: Diagnosis not present

## 2017-08-24 DIAGNOSIS — R4182 Altered mental status, unspecified: Secondary | ICD-10-CM | POA: Diagnosis not present

## 2017-08-24 DIAGNOSIS — H53452 Other localized visual field defect, left eye: Secondary | ICD-10-CM | POA: Diagnosis not present

## 2017-08-31 DIAGNOSIS — R4182 Altered mental status, unspecified: Secondary | ICD-10-CM | POA: Diagnosis not present

## 2017-08-31 DIAGNOSIS — I672 Cerebral atherosclerosis: Secondary | ICD-10-CM | POA: Diagnosis not present

## 2017-08-31 DIAGNOSIS — G8102 Flaccid hemiplegia affecting left dominant side: Secondary | ICD-10-CM | POA: Diagnosis not present

## 2017-08-31 DIAGNOSIS — I63331 Cerebral infarction due to thrombosis of right posterior cerebral artery: Secondary | ICD-10-CM | POA: Diagnosis not present

## 2017-08-31 DIAGNOSIS — H53452 Other localized visual field defect, left eye: Secondary | ICD-10-CM | POA: Diagnosis not present

## 2017-08-31 DIAGNOSIS — D45 Polycythemia vera: Secondary | ICD-10-CM | POA: Diagnosis not present

## 2017-09-01 DIAGNOSIS — I10 Essential (primary) hypertension: Secondary | ICD-10-CM | POA: Diagnosis not present

## 2017-09-01 DIAGNOSIS — H53452 Other localized visual field defect, left eye: Secondary | ICD-10-CM | POA: Diagnosis not present

## 2017-09-01 DIAGNOSIS — K219 Gastro-esophageal reflux disease without esophagitis: Secondary | ICD-10-CM | POA: Diagnosis not present

## 2017-09-01 DIAGNOSIS — I672 Cerebral atherosclerosis: Secondary | ICD-10-CM | POA: Diagnosis not present

## 2017-09-01 DIAGNOSIS — G8102 Flaccid hemiplegia affecting left dominant side: Secondary | ICD-10-CM | POA: Diagnosis not present

## 2017-09-01 DIAGNOSIS — I63331 Cerebral infarction due to thrombosis of right posterior cerebral artery: Secondary | ICD-10-CM | POA: Diagnosis not present

## 2017-09-01 DIAGNOSIS — M1990 Unspecified osteoarthritis, unspecified site: Secondary | ICD-10-CM | POA: Diagnosis not present

## 2017-09-01 DIAGNOSIS — J301 Allergic rhinitis due to pollen: Secondary | ICD-10-CM | POA: Diagnosis not present

## 2017-09-01 DIAGNOSIS — E039 Hypothyroidism, unspecified: Secondary | ICD-10-CM | POA: Diagnosis not present

## 2017-09-01 DIAGNOSIS — R4182 Altered mental status, unspecified: Secondary | ICD-10-CM | POA: Diagnosis not present

## 2017-09-01 DIAGNOSIS — F339 Major depressive disorder, recurrent, unspecified: Secondary | ICD-10-CM | POA: Diagnosis not present

## 2017-09-01 DIAGNOSIS — D45 Polycythemia vera: Secondary | ICD-10-CM | POA: Diagnosis not present

## 2017-09-01 DIAGNOSIS — G4089 Other seizures: Secondary | ICD-10-CM | POA: Diagnosis not present

## 2017-09-05 DIAGNOSIS — I672 Cerebral atherosclerosis: Secondary | ICD-10-CM | POA: Diagnosis not present

## 2017-09-05 DIAGNOSIS — H53452 Other localized visual field defect, left eye: Secondary | ICD-10-CM | POA: Diagnosis not present

## 2017-09-05 DIAGNOSIS — G8102 Flaccid hemiplegia affecting left dominant side: Secondary | ICD-10-CM | POA: Diagnosis not present

## 2017-09-05 DIAGNOSIS — D45 Polycythemia vera: Secondary | ICD-10-CM | POA: Diagnosis not present

## 2017-09-05 DIAGNOSIS — I63331 Cerebral infarction due to thrombosis of right posterior cerebral artery: Secondary | ICD-10-CM | POA: Diagnosis not present

## 2017-09-05 DIAGNOSIS — R4182 Altered mental status, unspecified: Secondary | ICD-10-CM | POA: Diagnosis not present

## 2017-09-06 DIAGNOSIS — G8102 Flaccid hemiplegia affecting left dominant side: Secondary | ICD-10-CM | POA: Diagnosis not present

## 2017-09-06 DIAGNOSIS — I672 Cerebral atherosclerosis: Secondary | ICD-10-CM | POA: Diagnosis not present

## 2017-09-06 DIAGNOSIS — I63331 Cerebral infarction due to thrombosis of right posterior cerebral artery: Secondary | ICD-10-CM | POA: Diagnosis not present

## 2017-09-06 DIAGNOSIS — R4182 Altered mental status, unspecified: Secondary | ICD-10-CM | POA: Diagnosis not present

## 2017-09-06 DIAGNOSIS — H53452 Other localized visual field defect, left eye: Secondary | ICD-10-CM | POA: Diagnosis not present

## 2017-09-06 DIAGNOSIS — D45 Polycythemia vera: Secondary | ICD-10-CM | POA: Diagnosis not present

## 2017-09-09 DIAGNOSIS — I63331 Cerebral infarction due to thrombosis of right posterior cerebral artery: Secondary | ICD-10-CM | POA: Diagnosis not present

## 2017-09-09 DIAGNOSIS — G8102 Flaccid hemiplegia affecting left dominant side: Secondary | ICD-10-CM | POA: Diagnosis not present

## 2017-09-09 DIAGNOSIS — H53452 Other localized visual field defect, left eye: Secondary | ICD-10-CM | POA: Diagnosis not present

## 2017-09-09 DIAGNOSIS — D45 Polycythemia vera: Secondary | ICD-10-CM | POA: Diagnosis not present

## 2017-09-09 DIAGNOSIS — I672 Cerebral atherosclerosis: Secondary | ICD-10-CM | POA: Diagnosis not present

## 2017-09-09 DIAGNOSIS — R4182 Altered mental status, unspecified: Secondary | ICD-10-CM | POA: Diagnosis not present

## 2017-09-12 DIAGNOSIS — G8102 Flaccid hemiplegia affecting left dominant side: Secondary | ICD-10-CM | POA: Diagnosis not present

## 2017-09-12 DIAGNOSIS — I672 Cerebral atherosclerosis: Secondary | ICD-10-CM | POA: Diagnosis not present

## 2017-09-12 DIAGNOSIS — H53452 Other localized visual field defect, left eye: Secondary | ICD-10-CM | POA: Diagnosis not present

## 2017-09-12 DIAGNOSIS — I63331 Cerebral infarction due to thrombosis of right posterior cerebral artery: Secondary | ICD-10-CM | POA: Diagnosis not present

## 2017-09-12 DIAGNOSIS — R4182 Altered mental status, unspecified: Secondary | ICD-10-CM | POA: Diagnosis not present

## 2017-09-12 DIAGNOSIS — D45 Polycythemia vera: Secondary | ICD-10-CM | POA: Diagnosis not present

## 2017-09-15 DIAGNOSIS — I672 Cerebral atherosclerosis: Secondary | ICD-10-CM | POA: Diagnosis not present

## 2017-09-15 DIAGNOSIS — R4182 Altered mental status, unspecified: Secondary | ICD-10-CM | POA: Diagnosis not present

## 2017-09-15 DIAGNOSIS — G8102 Flaccid hemiplegia affecting left dominant side: Secondary | ICD-10-CM | POA: Diagnosis not present

## 2017-09-15 DIAGNOSIS — D45 Polycythemia vera: Secondary | ICD-10-CM | POA: Diagnosis not present

## 2017-09-15 DIAGNOSIS — I63331 Cerebral infarction due to thrombosis of right posterior cerebral artery: Secondary | ICD-10-CM | POA: Diagnosis not present

## 2017-09-15 DIAGNOSIS — H53452 Other localized visual field defect, left eye: Secondary | ICD-10-CM | POA: Diagnosis not present

## 2017-09-21 DIAGNOSIS — H53452 Other localized visual field defect, left eye: Secondary | ICD-10-CM | POA: Diagnosis not present

## 2017-09-21 DIAGNOSIS — I672 Cerebral atherosclerosis: Secondary | ICD-10-CM | POA: Diagnosis not present

## 2017-09-21 DIAGNOSIS — R4182 Altered mental status, unspecified: Secondary | ICD-10-CM | POA: Diagnosis not present

## 2017-09-21 DIAGNOSIS — D45 Polycythemia vera: Secondary | ICD-10-CM | POA: Diagnosis not present

## 2017-09-21 DIAGNOSIS — I63331 Cerebral infarction due to thrombosis of right posterior cerebral artery: Secondary | ICD-10-CM | POA: Diagnosis not present

## 2017-09-21 DIAGNOSIS — G8102 Flaccid hemiplegia affecting left dominant side: Secondary | ICD-10-CM | POA: Diagnosis not present

## 2017-09-28 DIAGNOSIS — H53452 Other localized visual field defect, left eye: Secondary | ICD-10-CM | POA: Diagnosis not present

## 2017-09-28 DIAGNOSIS — R4182 Altered mental status, unspecified: Secondary | ICD-10-CM | POA: Diagnosis not present

## 2017-09-28 DIAGNOSIS — I672 Cerebral atherosclerosis: Secondary | ICD-10-CM | POA: Diagnosis not present

## 2017-09-28 DIAGNOSIS — I63331 Cerebral infarction due to thrombosis of right posterior cerebral artery: Secondary | ICD-10-CM | POA: Diagnosis not present

## 2017-09-28 DIAGNOSIS — G8102 Flaccid hemiplegia affecting left dominant side: Secondary | ICD-10-CM | POA: Diagnosis not present

## 2017-09-28 DIAGNOSIS — D45 Polycythemia vera: Secondary | ICD-10-CM | POA: Diagnosis not present

## 2017-09-29 DIAGNOSIS — H53452 Other localized visual field defect, left eye: Secondary | ICD-10-CM | POA: Diagnosis not present

## 2017-09-29 DIAGNOSIS — I672 Cerebral atherosclerosis: Secondary | ICD-10-CM | POA: Diagnosis not present

## 2017-09-29 DIAGNOSIS — G8102 Flaccid hemiplegia affecting left dominant side: Secondary | ICD-10-CM | POA: Diagnosis not present

## 2017-09-29 DIAGNOSIS — I63331 Cerebral infarction due to thrombosis of right posterior cerebral artery: Secondary | ICD-10-CM | POA: Diagnosis not present

## 2017-09-29 DIAGNOSIS — R4182 Altered mental status, unspecified: Secondary | ICD-10-CM | POA: Diagnosis not present

## 2017-09-29 DIAGNOSIS — D45 Polycythemia vera: Secondary | ICD-10-CM | POA: Diagnosis not present

## 2017-10-02 DIAGNOSIS — I672 Cerebral atherosclerosis: Secondary | ICD-10-CM | POA: Diagnosis not present

## 2017-10-02 DIAGNOSIS — G4089 Other seizures: Secondary | ICD-10-CM | POA: Diagnosis not present

## 2017-10-02 DIAGNOSIS — J301 Allergic rhinitis due to pollen: Secondary | ICD-10-CM | POA: Diagnosis not present

## 2017-10-02 DIAGNOSIS — G8102 Flaccid hemiplegia affecting left dominant side: Secondary | ICD-10-CM | POA: Diagnosis not present

## 2017-10-02 DIAGNOSIS — E039 Hypothyroidism, unspecified: Secondary | ICD-10-CM | POA: Diagnosis not present

## 2017-10-02 DIAGNOSIS — I63331 Cerebral infarction due to thrombosis of right posterior cerebral artery: Secondary | ICD-10-CM | POA: Diagnosis not present

## 2017-10-02 DIAGNOSIS — F339 Major depressive disorder, recurrent, unspecified: Secondary | ICD-10-CM | POA: Diagnosis not present

## 2017-10-02 DIAGNOSIS — M1990 Unspecified osteoarthritis, unspecified site: Secondary | ICD-10-CM | POA: Diagnosis not present

## 2017-10-02 DIAGNOSIS — R4182 Altered mental status, unspecified: Secondary | ICD-10-CM | POA: Diagnosis not present

## 2017-10-02 DIAGNOSIS — H53452 Other localized visual field defect, left eye: Secondary | ICD-10-CM | POA: Diagnosis not present

## 2017-10-02 DIAGNOSIS — I10 Essential (primary) hypertension: Secondary | ICD-10-CM | POA: Diagnosis not present

## 2017-10-02 DIAGNOSIS — K219 Gastro-esophageal reflux disease without esophagitis: Secondary | ICD-10-CM | POA: Diagnosis not present

## 2017-10-02 DIAGNOSIS — D45 Polycythemia vera: Secondary | ICD-10-CM | POA: Diagnosis not present

## 2017-10-06 DIAGNOSIS — G8102 Flaccid hemiplegia affecting left dominant side: Secondary | ICD-10-CM | POA: Diagnosis not present

## 2017-10-06 DIAGNOSIS — I63331 Cerebral infarction due to thrombosis of right posterior cerebral artery: Secondary | ICD-10-CM | POA: Diagnosis not present

## 2017-10-06 DIAGNOSIS — D45 Polycythemia vera: Secondary | ICD-10-CM | POA: Diagnosis not present

## 2017-10-06 DIAGNOSIS — I672 Cerebral atherosclerosis: Secondary | ICD-10-CM | POA: Diagnosis not present

## 2017-10-06 DIAGNOSIS — H53452 Other localized visual field defect, left eye: Secondary | ICD-10-CM | POA: Diagnosis not present

## 2017-10-06 DIAGNOSIS — R4182 Altered mental status, unspecified: Secondary | ICD-10-CM | POA: Diagnosis not present

## 2017-10-10 DIAGNOSIS — D45 Polycythemia vera: Secondary | ICD-10-CM | POA: Diagnosis not present

## 2017-10-10 DIAGNOSIS — H53452 Other localized visual field defect, left eye: Secondary | ICD-10-CM | POA: Diagnosis not present

## 2017-10-10 DIAGNOSIS — I672 Cerebral atherosclerosis: Secondary | ICD-10-CM | POA: Diagnosis not present

## 2017-10-10 DIAGNOSIS — I63331 Cerebral infarction due to thrombosis of right posterior cerebral artery: Secondary | ICD-10-CM | POA: Diagnosis not present

## 2017-10-10 DIAGNOSIS — G8102 Flaccid hemiplegia affecting left dominant side: Secondary | ICD-10-CM | POA: Diagnosis not present

## 2017-10-10 DIAGNOSIS — R4182 Altered mental status, unspecified: Secondary | ICD-10-CM | POA: Diagnosis not present

## 2017-10-13 DIAGNOSIS — I672 Cerebral atherosclerosis: Secondary | ICD-10-CM | POA: Diagnosis not present

## 2017-10-13 DIAGNOSIS — G8102 Flaccid hemiplegia affecting left dominant side: Secondary | ICD-10-CM | POA: Diagnosis not present

## 2017-10-13 DIAGNOSIS — I63331 Cerebral infarction due to thrombosis of right posterior cerebral artery: Secondary | ICD-10-CM | POA: Diagnosis not present

## 2017-10-13 DIAGNOSIS — D45 Polycythemia vera: Secondary | ICD-10-CM | POA: Diagnosis not present

## 2017-10-13 DIAGNOSIS — H53452 Other localized visual field defect, left eye: Secondary | ICD-10-CM | POA: Diagnosis not present

## 2017-10-13 DIAGNOSIS — R4182 Altered mental status, unspecified: Secondary | ICD-10-CM | POA: Diagnosis not present

## 2017-10-20 DIAGNOSIS — D45 Polycythemia vera: Secondary | ICD-10-CM | POA: Diagnosis not present

## 2017-10-20 DIAGNOSIS — R4182 Altered mental status, unspecified: Secondary | ICD-10-CM | POA: Diagnosis not present

## 2017-10-20 DIAGNOSIS — H53452 Other localized visual field defect, left eye: Secondary | ICD-10-CM | POA: Diagnosis not present

## 2017-10-20 DIAGNOSIS — I63331 Cerebral infarction due to thrombosis of right posterior cerebral artery: Secondary | ICD-10-CM | POA: Diagnosis not present

## 2017-10-20 DIAGNOSIS — I672 Cerebral atherosclerosis: Secondary | ICD-10-CM | POA: Diagnosis not present

## 2017-10-20 DIAGNOSIS — G8102 Flaccid hemiplegia affecting left dominant side: Secondary | ICD-10-CM | POA: Diagnosis not present

## 2017-10-25 DIAGNOSIS — D45 Polycythemia vera: Secondary | ICD-10-CM | POA: Diagnosis not present

## 2017-10-25 DIAGNOSIS — H53452 Other localized visual field defect, left eye: Secondary | ICD-10-CM | POA: Diagnosis not present

## 2017-10-25 DIAGNOSIS — R4182 Altered mental status, unspecified: Secondary | ICD-10-CM | POA: Diagnosis not present

## 2017-10-25 DIAGNOSIS — G8102 Flaccid hemiplegia affecting left dominant side: Secondary | ICD-10-CM | POA: Diagnosis not present

## 2017-10-25 DIAGNOSIS — I672 Cerebral atherosclerosis: Secondary | ICD-10-CM | POA: Diagnosis not present

## 2017-10-25 DIAGNOSIS — I63331 Cerebral infarction due to thrombosis of right posterior cerebral artery: Secondary | ICD-10-CM | POA: Diagnosis not present

## 2017-10-27 DIAGNOSIS — I63331 Cerebral infarction due to thrombosis of right posterior cerebral artery: Secondary | ICD-10-CM | POA: Diagnosis not present

## 2017-10-27 DIAGNOSIS — R4182 Altered mental status, unspecified: Secondary | ICD-10-CM | POA: Diagnosis not present

## 2017-10-27 DIAGNOSIS — D45 Polycythemia vera: Secondary | ICD-10-CM | POA: Diagnosis not present

## 2017-10-27 DIAGNOSIS — G8102 Flaccid hemiplegia affecting left dominant side: Secondary | ICD-10-CM | POA: Diagnosis not present

## 2017-10-27 DIAGNOSIS — I672 Cerebral atherosclerosis: Secondary | ICD-10-CM | POA: Diagnosis not present

## 2017-10-27 DIAGNOSIS — H53452 Other localized visual field defect, left eye: Secondary | ICD-10-CM | POA: Diagnosis not present

## 2017-11-01 DIAGNOSIS — I63331 Cerebral infarction due to thrombosis of right posterior cerebral artery: Secondary | ICD-10-CM | POA: Diagnosis not present

## 2017-11-01 DIAGNOSIS — G4089 Other seizures: Secondary | ICD-10-CM | POA: Diagnosis not present

## 2017-11-01 DIAGNOSIS — F339 Major depressive disorder, recurrent, unspecified: Secondary | ICD-10-CM | POA: Diagnosis not present

## 2017-11-01 DIAGNOSIS — D45 Polycythemia vera: Secondary | ICD-10-CM | POA: Diagnosis not present

## 2017-11-01 DIAGNOSIS — I672 Cerebral atherosclerosis: Secondary | ICD-10-CM | POA: Diagnosis not present

## 2017-11-01 DIAGNOSIS — I10 Essential (primary) hypertension: Secondary | ICD-10-CM | POA: Diagnosis not present

## 2017-11-01 DIAGNOSIS — R4182 Altered mental status, unspecified: Secondary | ICD-10-CM | POA: Diagnosis not present

## 2017-11-01 DIAGNOSIS — G8102 Flaccid hemiplegia affecting left dominant side: Secondary | ICD-10-CM | POA: Diagnosis not present

## 2017-11-01 DIAGNOSIS — H53452 Other localized visual field defect, left eye: Secondary | ICD-10-CM | POA: Diagnosis not present

## 2017-11-01 DIAGNOSIS — K219 Gastro-esophageal reflux disease without esophagitis: Secondary | ICD-10-CM | POA: Diagnosis not present

## 2017-11-01 DIAGNOSIS — M1990 Unspecified osteoarthritis, unspecified site: Secondary | ICD-10-CM | POA: Diagnosis not present

## 2017-11-01 DIAGNOSIS — E039 Hypothyroidism, unspecified: Secondary | ICD-10-CM | POA: Diagnosis not present

## 2017-11-01 DIAGNOSIS — J301 Allergic rhinitis due to pollen: Secondary | ICD-10-CM | POA: Diagnosis not present

## 2017-11-02 DIAGNOSIS — I63331 Cerebral infarction due to thrombosis of right posterior cerebral artery: Secondary | ICD-10-CM | POA: Diagnosis not present

## 2017-11-02 DIAGNOSIS — I672 Cerebral atherosclerosis: Secondary | ICD-10-CM | POA: Diagnosis not present

## 2017-11-02 DIAGNOSIS — R4182 Altered mental status, unspecified: Secondary | ICD-10-CM | POA: Diagnosis not present

## 2017-11-02 DIAGNOSIS — G8102 Flaccid hemiplegia affecting left dominant side: Secondary | ICD-10-CM | POA: Diagnosis not present

## 2017-11-02 DIAGNOSIS — D45 Polycythemia vera: Secondary | ICD-10-CM | POA: Diagnosis not present

## 2017-11-02 DIAGNOSIS — H53452 Other localized visual field defect, left eye: Secondary | ICD-10-CM | POA: Diagnosis not present

## 2017-11-07 DIAGNOSIS — H53452 Other localized visual field defect, left eye: Secondary | ICD-10-CM | POA: Diagnosis not present

## 2017-11-07 DIAGNOSIS — D45 Polycythemia vera: Secondary | ICD-10-CM | POA: Diagnosis not present

## 2017-11-07 DIAGNOSIS — G8102 Flaccid hemiplegia affecting left dominant side: Secondary | ICD-10-CM | POA: Diagnosis not present

## 2017-11-07 DIAGNOSIS — I63331 Cerebral infarction due to thrombosis of right posterior cerebral artery: Secondary | ICD-10-CM | POA: Diagnosis not present

## 2017-11-07 DIAGNOSIS — R4182 Altered mental status, unspecified: Secondary | ICD-10-CM | POA: Diagnosis not present

## 2017-11-07 DIAGNOSIS — I672 Cerebral atherosclerosis: Secondary | ICD-10-CM | POA: Diagnosis not present

## 2017-11-08 DIAGNOSIS — H53452 Other localized visual field defect, left eye: Secondary | ICD-10-CM | POA: Diagnosis not present

## 2017-11-08 DIAGNOSIS — R4182 Altered mental status, unspecified: Secondary | ICD-10-CM | POA: Diagnosis not present

## 2017-11-08 DIAGNOSIS — D45 Polycythemia vera: Secondary | ICD-10-CM | POA: Diagnosis not present

## 2017-11-08 DIAGNOSIS — G8102 Flaccid hemiplegia affecting left dominant side: Secondary | ICD-10-CM | POA: Diagnosis not present

## 2017-11-08 DIAGNOSIS — I672 Cerebral atherosclerosis: Secondary | ICD-10-CM | POA: Diagnosis not present

## 2017-11-08 DIAGNOSIS — I63331 Cerebral infarction due to thrombosis of right posterior cerebral artery: Secondary | ICD-10-CM | POA: Diagnosis not present

## 2017-11-17 DIAGNOSIS — G8102 Flaccid hemiplegia affecting left dominant side: Secondary | ICD-10-CM | POA: Diagnosis not present

## 2017-11-17 DIAGNOSIS — R4182 Altered mental status, unspecified: Secondary | ICD-10-CM | POA: Diagnosis not present

## 2017-11-17 DIAGNOSIS — I672 Cerebral atherosclerosis: Secondary | ICD-10-CM | POA: Diagnosis not present

## 2017-11-17 DIAGNOSIS — D45 Polycythemia vera: Secondary | ICD-10-CM | POA: Diagnosis not present

## 2017-11-17 DIAGNOSIS — H53452 Other localized visual field defect, left eye: Secondary | ICD-10-CM | POA: Diagnosis not present

## 2017-11-17 DIAGNOSIS — I63331 Cerebral infarction due to thrombosis of right posterior cerebral artery: Secondary | ICD-10-CM | POA: Diagnosis not present

## 2017-11-22 DIAGNOSIS — R4182 Altered mental status, unspecified: Secondary | ICD-10-CM | POA: Diagnosis not present

## 2017-11-22 DIAGNOSIS — I672 Cerebral atherosclerosis: Secondary | ICD-10-CM | POA: Diagnosis not present

## 2017-11-22 DIAGNOSIS — H53452 Other localized visual field defect, left eye: Secondary | ICD-10-CM | POA: Diagnosis not present

## 2017-11-22 DIAGNOSIS — G8102 Flaccid hemiplegia affecting left dominant side: Secondary | ICD-10-CM | POA: Diagnosis not present

## 2017-11-22 DIAGNOSIS — D45 Polycythemia vera: Secondary | ICD-10-CM | POA: Diagnosis not present

## 2017-11-22 DIAGNOSIS — I63331 Cerebral infarction due to thrombosis of right posterior cerebral artery: Secondary | ICD-10-CM | POA: Diagnosis not present

## 2017-11-25 DIAGNOSIS — G8102 Flaccid hemiplegia affecting left dominant side: Secondary | ICD-10-CM | POA: Diagnosis not present

## 2017-11-25 DIAGNOSIS — R4182 Altered mental status, unspecified: Secondary | ICD-10-CM | POA: Diagnosis not present

## 2017-11-25 DIAGNOSIS — H53452 Other localized visual field defect, left eye: Secondary | ICD-10-CM | POA: Diagnosis not present

## 2017-11-25 DIAGNOSIS — I672 Cerebral atherosclerosis: Secondary | ICD-10-CM | POA: Diagnosis not present

## 2017-11-25 DIAGNOSIS — D45 Polycythemia vera: Secondary | ICD-10-CM | POA: Diagnosis not present

## 2017-11-25 DIAGNOSIS — I63331 Cerebral infarction due to thrombosis of right posterior cerebral artery: Secondary | ICD-10-CM | POA: Diagnosis not present

## 2017-12-02 DIAGNOSIS — H53452 Other localized visual field defect, left eye: Secondary | ICD-10-CM | POA: Diagnosis not present

## 2017-12-02 DIAGNOSIS — G8102 Flaccid hemiplegia affecting left dominant side: Secondary | ICD-10-CM | POA: Diagnosis not present

## 2017-12-02 DIAGNOSIS — I672 Cerebral atherosclerosis: Secondary | ICD-10-CM | POA: Diagnosis not present

## 2017-12-02 DIAGNOSIS — R4182 Altered mental status, unspecified: Secondary | ICD-10-CM | POA: Diagnosis not present

## 2017-12-02 DIAGNOSIS — F339 Major depressive disorder, recurrent, unspecified: Secondary | ICD-10-CM | POA: Diagnosis not present

## 2017-12-02 DIAGNOSIS — D45 Polycythemia vera: Secondary | ICD-10-CM | POA: Diagnosis not present

## 2017-12-02 DIAGNOSIS — G4089 Other seizures: Secondary | ICD-10-CM | POA: Diagnosis not present

## 2017-12-02 DIAGNOSIS — E039 Hypothyroidism, unspecified: Secondary | ICD-10-CM | POA: Diagnosis not present

## 2017-12-02 DIAGNOSIS — J301 Allergic rhinitis due to pollen: Secondary | ICD-10-CM | POA: Diagnosis not present

## 2017-12-02 DIAGNOSIS — K219 Gastro-esophageal reflux disease without esophagitis: Secondary | ICD-10-CM | POA: Diagnosis not present

## 2017-12-02 DIAGNOSIS — I63331 Cerebral infarction due to thrombosis of right posterior cerebral artery: Secondary | ICD-10-CM | POA: Diagnosis not present

## 2017-12-02 DIAGNOSIS — I10 Essential (primary) hypertension: Secondary | ICD-10-CM | POA: Diagnosis not present

## 2017-12-02 DIAGNOSIS — M1990 Unspecified osteoarthritis, unspecified site: Secondary | ICD-10-CM | POA: Diagnosis not present

## 2017-12-05 DIAGNOSIS — I63331 Cerebral infarction due to thrombosis of right posterior cerebral artery: Secondary | ICD-10-CM | POA: Diagnosis not present

## 2017-12-05 DIAGNOSIS — D45 Polycythemia vera: Secondary | ICD-10-CM | POA: Diagnosis not present

## 2017-12-05 DIAGNOSIS — I672 Cerebral atherosclerosis: Secondary | ICD-10-CM | POA: Diagnosis not present

## 2017-12-05 DIAGNOSIS — H53452 Other localized visual field defect, left eye: Secondary | ICD-10-CM | POA: Diagnosis not present

## 2017-12-05 DIAGNOSIS — G8102 Flaccid hemiplegia affecting left dominant side: Secondary | ICD-10-CM | POA: Diagnosis not present

## 2017-12-05 DIAGNOSIS — R4182 Altered mental status, unspecified: Secondary | ICD-10-CM | POA: Diagnosis not present

## 2017-12-08 DIAGNOSIS — G8102 Flaccid hemiplegia affecting left dominant side: Secondary | ICD-10-CM | POA: Diagnosis not present

## 2017-12-08 DIAGNOSIS — I63331 Cerebral infarction due to thrombosis of right posterior cerebral artery: Secondary | ICD-10-CM | POA: Diagnosis not present

## 2017-12-08 DIAGNOSIS — H53452 Other localized visual field defect, left eye: Secondary | ICD-10-CM | POA: Diagnosis not present

## 2017-12-08 DIAGNOSIS — R4182 Altered mental status, unspecified: Secondary | ICD-10-CM | POA: Diagnosis not present

## 2017-12-08 DIAGNOSIS — I672 Cerebral atherosclerosis: Secondary | ICD-10-CM | POA: Diagnosis not present

## 2017-12-08 DIAGNOSIS — D45 Polycythemia vera: Secondary | ICD-10-CM | POA: Diagnosis not present

## 2017-12-15 DIAGNOSIS — I63331 Cerebral infarction due to thrombosis of right posterior cerebral artery: Secondary | ICD-10-CM | POA: Diagnosis not present

## 2017-12-15 DIAGNOSIS — H53452 Other localized visual field defect, left eye: Secondary | ICD-10-CM | POA: Diagnosis not present

## 2017-12-15 DIAGNOSIS — G8102 Flaccid hemiplegia affecting left dominant side: Secondary | ICD-10-CM | POA: Diagnosis not present

## 2017-12-15 DIAGNOSIS — I672 Cerebral atherosclerosis: Secondary | ICD-10-CM | POA: Diagnosis not present

## 2017-12-15 DIAGNOSIS — D45 Polycythemia vera: Secondary | ICD-10-CM | POA: Diagnosis not present

## 2017-12-15 DIAGNOSIS — R4182 Altered mental status, unspecified: Secondary | ICD-10-CM | POA: Diagnosis not present

## 2017-12-22 DIAGNOSIS — R4182 Altered mental status, unspecified: Secondary | ICD-10-CM | POA: Diagnosis not present

## 2017-12-22 DIAGNOSIS — I63331 Cerebral infarction due to thrombosis of right posterior cerebral artery: Secondary | ICD-10-CM | POA: Diagnosis not present

## 2017-12-22 DIAGNOSIS — G8102 Flaccid hemiplegia affecting left dominant side: Secondary | ICD-10-CM | POA: Diagnosis not present

## 2017-12-22 DIAGNOSIS — D45 Polycythemia vera: Secondary | ICD-10-CM | POA: Diagnosis not present

## 2017-12-22 DIAGNOSIS — I672 Cerebral atherosclerosis: Secondary | ICD-10-CM | POA: Diagnosis not present

## 2017-12-22 DIAGNOSIS — H53452 Other localized visual field defect, left eye: Secondary | ICD-10-CM | POA: Diagnosis not present

## 2017-12-28 DIAGNOSIS — R4182 Altered mental status, unspecified: Secondary | ICD-10-CM | POA: Diagnosis not present

## 2017-12-28 DIAGNOSIS — I672 Cerebral atherosclerosis: Secondary | ICD-10-CM | POA: Diagnosis not present

## 2017-12-28 DIAGNOSIS — G8102 Flaccid hemiplegia affecting left dominant side: Secondary | ICD-10-CM | POA: Diagnosis not present

## 2017-12-28 DIAGNOSIS — H53452 Other localized visual field defect, left eye: Secondary | ICD-10-CM | POA: Diagnosis not present

## 2017-12-28 DIAGNOSIS — I63331 Cerebral infarction due to thrombosis of right posterior cerebral artery: Secondary | ICD-10-CM | POA: Diagnosis not present

## 2017-12-28 DIAGNOSIS — D45 Polycythemia vera: Secondary | ICD-10-CM | POA: Diagnosis not present

## 2018-01-01 DIAGNOSIS — M1990 Unspecified osteoarthritis, unspecified site: Secondary | ICD-10-CM | POA: Diagnosis not present

## 2018-01-01 DIAGNOSIS — G8102 Flaccid hemiplegia affecting left dominant side: Secondary | ICD-10-CM | POA: Diagnosis not present

## 2018-01-01 DIAGNOSIS — J301 Allergic rhinitis due to pollen: Secondary | ICD-10-CM | POA: Diagnosis not present

## 2018-01-01 DIAGNOSIS — I63331 Cerebral infarction due to thrombosis of right posterior cerebral artery: Secondary | ICD-10-CM | POA: Diagnosis not present

## 2018-01-01 DIAGNOSIS — F339 Major depressive disorder, recurrent, unspecified: Secondary | ICD-10-CM | POA: Diagnosis not present

## 2018-01-01 DIAGNOSIS — I672 Cerebral atherosclerosis: Secondary | ICD-10-CM | POA: Diagnosis not present

## 2018-01-01 DIAGNOSIS — I10 Essential (primary) hypertension: Secondary | ICD-10-CM | POA: Diagnosis not present

## 2018-01-01 DIAGNOSIS — R4182 Altered mental status, unspecified: Secondary | ICD-10-CM | POA: Diagnosis not present

## 2018-01-01 DIAGNOSIS — K219 Gastro-esophageal reflux disease without esophagitis: Secondary | ICD-10-CM | POA: Diagnosis not present

## 2018-01-01 DIAGNOSIS — D45 Polycythemia vera: Secondary | ICD-10-CM | POA: Diagnosis not present

## 2018-01-01 DIAGNOSIS — E039 Hypothyroidism, unspecified: Secondary | ICD-10-CM | POA: Diagnosis not present

## 2018-01-01 DIAGNOSIS — H53452 Other localized visual field defect, left eye: Secondary | ICD-10-CM | POA: Diagnosis not present

## 2018-01-01 DIAGNOSIS — G4089 Other seizures: Secondary | ICD-10-CM | POA: Diagnosis not present

## 2018-01-05 DIAGNOSIS — G8102 Flaccid hemiplegia affecting left dominant side: Secondary | ICD-10-CM | POA: Diagnosis not present

## 2018-01-05 DIAGNOSIS — I63331 Cerebral infarction due to thrombosis of right posterior cerebral artery: Secondary | ICD-10-CM | POA: Diagnosis not present

## 2018-01-05 DIAGNOSIS — H53452 Other localized visual field defect, left eye: Secondary | ICD-10-CM | POA: Diagnosis not present

## 2018-01-05 DIAGNOSIS — I672 Cerebral atherosclerosis: Secondary | ICD-10-CM | POA: Diagnosis not present

## 2018-01-05 DIAGNOSIS — R4182 Altered mental status, unspecified: Secondary | ICD-10-CM | POA: Diagnosis not present

## 2018-01-05 DIAGNOSIS — D45 Polycythemia vera: Secondary | ICD-10-CM | POA: Diagnosis not present

## 2018-01-11 DIAGNOSIS — I672 Cerebral atherosclerosis: Secondary | ICD-10-CM | POA: Diagnosis not present

## 2018-01-11 DIAGNOSIS — G8102 Flaccid hemiplegia affecting left dominant side: Secondary | ICD-10-CM | POA: Diagnosis not present

## 2018-01-11 DIAGNOSIS — D45 Polycythemia vera: Secondary | ICD-10-CM | POA: Diagnosis not present

## 2018-01-11 DIAGNOSIS — H53452 Other localized visual field defect, left eye: Secondary | ICD-10-CM | POA: Diagnosis not present

## 2018-01-11 DIAGNOSIS — I63331 Cerebral infarction due to thrombosis of right posterior cerebral artery: Secondary | ICD-10-CM | POA: Diagnosis not present

## 2018-01-11 DIAGNOSIS — R4182 Altered mental status, unspecified: Secondary | ICD-10-CM | POA: Diagnosis not present

## 2018-01-19 DIAGNOSIS — R4182 Altered mental status, unspecified: Secondary | ICD-10-CM | POA: Diagnosis not present

## 2018-01-19 DIAGNOSIS — G8102 Flaccid hemiplegia affecting left dominant side: Secondary | ICD-10-CM | POA: Diagnosis not present

## 2018-01-19 DIAGNOSIS — I63331 Cerebral infarction due to thrombosis of right posterior cerebral artery: Secondary | ICD-10-CM | POA: Diagnosis not present

## 2018-01-19 DIAGNOSIS — H53452 Other localized visual field defect, left eye: Secondary | ICD-10-CM | POA: Diagnosis not present

## 2018-01-19 DIAGNOSIS — D45 Polycythemia vera: Secondary | ICD-10-CM | POA: Diagnosis not present

## 2018-01-19 DIAGNOSIS — I672 Cerebral atherosclerosis: Secondary | ICD-10-CM | POA: Diagnosis not present

## 2018-01-24 DIAGNOSIS — G8102 Flaccid hemiplegia affecting left dominant side: Secondary | ICD-10-CM | POA: Diagnosis not present

## 2018-01-24 DIAGNOSIS — R4182 Altered mental status, unspecified: Secondary | ICD-10-CM | POA: Diagnosis not present

## 2018-01-24 DIAGNOSIS — D45 Polycythemia vera: Secondary | ICD-10-CM | POA: Diagnosis not present

## 2018-01-24 DIAGNOSIS — I672 Cerebral atherosclerosis: Secondary | ICD-10-CM | POA: Diagnosis not present

## 2018-01-24 DIAGNOSIS — H53452 Other localized visual field defect, left eye: Secondary | ICD-10-CM | POA: Diagnosis not present

## 2018-01-24 DIAGNOSIS — I63331 Cerebral infarction due to thrombosis of right posterior cerebral artery: Secondary | ICD-10-CM | POA: Diagnosis not present

## 2018-02-01 DIAGNOSIS — R4182 Altered mental status, unspecified: Secondary | ICD-10-CM | POA: Diagnosis not present

## 2018-02-01 DIAGNOSIS — G8102 Flaccid hemiplegia affecting left dominant side: Secondary | ICD-10-CM | POA: Diagnosis not present

## 2018-02-01 DIAGNOSIS — D45 Polycythemia vera: Secondary | ICD-10-CM | POA: Diagnosis not present

## 2018-02-01 DIAGNOSIS — J301 Allergic rhinitis due to pollen: Secondary | ICD-10-CM | POA: Diagnosis not present

## 2018-02-01 DIAGNOSIS — M1990 Unspecified osteoarthritis, unspecified site: Secondary | ICD-10-CM | POA: Diagnosis not present

## 2018-02-01 DIAGNOSIS — H53452 Other localized visual field defect, left eye: Secondary | ICD-10-CM | POA: Diagnosis not present

## 2018-02-01 DIAGNOSIS — K219 Gastro-esophageal reflux disease without esophagitis: Secondary | ICD-10-CM | POA: Diagnosis not present

## 2018-02-01 DIAGNOSIS — I672 Cerebral atherosclerosis: Secondary | ICD-10-CM | POA: Diagnosis not present

## 2018-02-01 DIAGNOSIS — F339 Major depressive disorder, recurrent, unspecified: Secondary | ICD-10-CM | POA: Diagnosis not present

## 2018-02-01 DIAGNOSIS — E039 Hypothyroidism, unspecified: Secondary | ICD-10-CM | POA: Diagnosis not present

## 2018-02-01 DIAGNOSIS — I10 Essential (primary) hypertension: Secondary | ICD-10-CM | POA: Diagnosis not present

## 2018-02-01 DIAGNOSIS — G4089 Other seizures: Secondary | ICD-10-CM | POA: Diagnosis not present

## 2018-02-01 DIAGNOSIS — I63331 Cerebral infarction due to thrombosis of right posterior cerebral artery: Secondary | ICD-10-CM | POA: Diagnosis not present

## 2018-02-02 DIAGNOSIS — I63331 Cerebral infarction due to thrombosis of right posterior cerebral artery: Secondary | ICD-10-CM | POA: Diagnosis not present

## 2018-02-02 DIAGNOSIS — R4182 Altered mental status, unspecified: Secondary | ICD-10-CM | POA: Diagnosis not present

## 2018-02-02 DIAGNOSIS — G8102 Flaccid hemiplegia affecting left dominant side: Secondary | ICD-10-CM | POA: Diagnosis not present

## 2018-02-02 DIAGNOSIS — H53452 Other localized visual field defect, left eye: Secondary | ICD-10-CM | POA: Diagnosis not present

## 2018-02-02 DIAGNOSIS — D45 Polycythemia vera: Secondary | ICD-10-CM | POA: Diagnosis not present

## 2018-02-02 DIAGNOSIS — I672 Cerebral atherosclerosis: Secondary | ICD-10-CM | POA: Diagnosis not present

## 2018-02-06 DIAGNOSIS — G8102 Flaccid hemiplegia affecting left dominant side: Secondary | ICD-10-CM | POA: Diagnosis not present

## 2018-02-06 DIAGNOSIS — R4182 Altered mental status, unspecified: Secondary | ICD-10-CM | POA: Diagnosis not present

## 2018-02-06 DIAGNOSIS — I672 Cerebral atherosclerosis: Secondary | ICD-10-CM | POA: Diagnosis not present

## 2018-02-06 DIAGNOSIS — H53452 Other localized visual field defect, left eye: Secondary | ICD-10-CM | POA: Diagnosis not present

## 2018-02-06 DIAGNOSIS — I63331 Cerebral infarction due to thrombosis of right posterior cerebral artery: Secondary | ICD-10-CM | POA: Diagnosis not present

## 2018-02-06 DIAGNOSIS — D45 Polycythemia vera: Secondary | ICD-10-CM | POA: Diagnosis not present

## 2018-02-08 DIAGNOSIS — G8102 Flaccid hemiplegia affecting left dominant side: Secondary | ICD-10-CM | POA: Diagnosis not present

## 2018-02-08 DIAGNOSIS — H53452 Other localized visual field defect, left eye: Secondary | ICD-10-CM | POA: Diagnosis not present

## 2018-02-08 DIAGNOSIS — D45 Polycythemia vera: Secondary | ICD-10-CM | POA: Diagnosis not present

## 2018-02-08 DIAGNOSIS — R4182 Altered mental status, unspecified: Secondary | ICD-10-CM | POA: Diagnosis not present

## 2018-02-08 DIAGNOSIS — I63331 Cerebral infarction due to thrombosis of right posterior cerebral artery: Secondary | ICD-10-CM | POA: Diagnosis not present

## 2018-02-08 DIAGNOSIS — I672 Cerebral atherosclerosis: Secondary | ICD-10-CM | POA: Diagnosis not present

## 2018-02-15 DIAGNOSIS — I672 Cerebral atherosclerosis: Secondary | ICD-10-CM | POA: Diagnosis not present

## 2018-02-15 DIAGNOSIS — I63331 Cerebral infarction due to thrombosis of right posterior cerebral artery: Secondary | ICD-10-CM | POA: Diagnosis not present

## 2018-02-15 DIAGNOSIS — R4182 Altered mental status, unspecified: Secondary | ICD-10-CM | POA: Diagnosis not present

## 2018-02-15 DIAGNOSIS — G8102 Flaccid hemiplegia affecting left dominant side: Secondary | ICD-10-CM | POA: Diagnosis not present

## 2018-02-15 DIAGNOSIS — H53452 Other localized visual field defect, left eye: Secondary | ICD-10-CM | POA: Diagnosis not present

## 2018-02-15 DIAGNOSIS — D45 Polycythemia vera: Secondary | ICD-10-CM | POA: Diagnosis not present

## 2018-02-22 DIAGNOSIS — G8102 Flaccid hemiplegia affecting left dominant side: Secondary | ICD-10-CM | POA: Diagnosis not present

## 2018-02-22 DIAGNOSIS — R4182 Altered mental status, unspecified: Secondary | ICD-10-CM | POA: Diagnosis not present

## 2018-02-22 DIAGNOSIS — D45 Polycythemia vera: Secondary | ICD-10-CM | POA: Diagnosis not present

## 2018-02-22 DIAGNOSIS — I63331 Cerebral infarction due to thrombosis of right posterior cerebral artery: Secondary | ICD-10-CM | POA: Diagnosis not present

## 2018-02-22 DIAGNOSIS — I672 Cerebral atherosclerosis: Secondary | ICD-10-CM | POA: Diagnosis not present

## 2018-02-22 DIAGNOSIS — H53452 Other localized visual field defect, left eye: Secondary | ICD-10-CM | POA: Diagnosis not present

## 2018-03-01 DIAGNOSIS — G8102 Flaccid hemiplegia affecting left dominant side: Secondary | ICD-10-CM | POA: Diagnosis not present

## 2018-03-01 DIAGNOSIS — I63331 Cerebral infarction due to thrombosis of right posterior cerebral artery: Secondary | ICD-10-CM | POA: Diagnosis not present

## 2018-03-01 DIAGNOSIS — I672 Cerebral atherosclerosis: Secondary | ICD-10-CM | POA: Diagnosis not present

## 2018-03-01 DIAGNOSIS — H53452 Other localized visual field defect, left eye: Secondary | ICD-10-CM | POA: Diagnosis not present

## 2018-03-01 DIAGNOSIS — D45 Polycythemia vera: Secondary | ICD-10-CM | POA: Diagnosis not present

## 2018-03-01 DIAGNOSIS — R4182 Altered mental status, unspecified: Secondary | ICD-10-CM | POA: Diagnosis not present

## 2018-03-04 DIAGNOSIS — G4089 Other seizures: Secondary | ICD-10-CM | POA: Diagnosis not present

## 2018-03-04 DIAGNOSIS — I63331 Cerebral infarction due to thrombosis of right posterior cerebral artery: Secondary | ICD-10-CM | POA: Diagnosis not present

## 2018-03-04 DIAGNOSIS — R4182 Altered mental status, unspecified: Secondary | ICD-10-CM | POA: Diagnosis not present

## 2018-03-04 DIAGNOSIS — I10 Essential (primary) hypertension: Secondary | ICD-10-CM | POA: Diagnosis not present

## 2018-03-04 DIAGNOSIS — E039 Hypothyroidism, unspecified: Secondary | ICD-10-CM | POA: Diagnosis not present

## 2018-03-04 DIAGNOSIS — F339 Major depressive disorder, recurrent, unspecified: Secondary | ICD-10-CM | POA: Diagnosis not present

## 2018-03-04 DIAGNOSIS — M1990 Unspecified osteoarthritis, unspecified site: Secondary | ICD-10-CM | POA: Diagnosis not present

## 2018-03-04 DIAGNOSIS — D45 Polycythemia vera: Secondary | ICD-10-CM | POA: Diagnosis not present

## 2018-03-04 DIAGNOSIS — H53452 Other localized visual field defect, left eye: Secondary | ICD-10-CM | POA: Diagnosis not present

## 2018-03-04 DIAGNOSIS — G8102 Flaccid hemiplegia affecting left dominant side: Secondary | ICD-10-CM | POA: Diagnosis not present

## 2018-03-04 DIAGNOSIS — J301 Allergic rhinitis due to pollen: Secondary | ICD-10-CM | POA: Diagnosis not present

## 2018-03-04 DIAGNOSIS — K219 Gastro-esophageal reflux disease without esophagitis: Secondary | ICD-10-CM | POA: Diagnosis not present

## 2018-03-04 DIAGNOSIS — I672 Cerebral atherosclerosis: Secondary | ICD-10-CM | POA: Diagnosis not present

## 2018-03-09 DIAGNOSIS — D45 Polycythemia vera: Secondary | ICD-10-CM | POA: Diagnosis not present

## 2018-03-09 DIAGNOSIS — I672 Cerebral atherosclerosis: Secondary | ICD-10-CM | POA: Diagnosis not present

## 2018-03-09 DIAGNOSIS — I63331 Cerebral infarction due to thrombosis of right posterior cerebral artery: Secondary | ICD-10-CM | POA: Diagnosis not present

## 2018-03-09 DIAGNOSIS — H53452 Other localized visual field defect, left eye: Secondary | ICD-10-CM | POA: Diagnosis not present

## 2018-03-09 DIAGNOSIS — R4182 Altered mental status, unspecified: Secondary | ICD-10-CM | POA: Diagnosis not present

## 2018-03-09 DIAGNOSIS — G8102 Flaccid hemiplegia affecting left dominant side: Secondary | ICD-10-CM | POA: Diagnosis not present

## 2018-03-17 DIAGNOSIS — H53452 Other localized visual field defect, left eye: Secondary | ICD-10-CM | POA: Diagnosis not present

## 2018-03-17 DIAGNOSIS — I63331 Cerebral infarction due to thrombosis of right posterior cerebral artery: Secondary | ICD-10-CM | POA: Diagnosis not present

## 2018-03-17 DIAGNOSIS — I672 Cerebral atherosclerosis: Secondary | ICD-10-CM | POA: Diagnosis not present

## 2018-03-17 DIAGNOSIS — D45 Polycythemia vera: Secondary | ICD-10-CM | POA: Diagnosis not present

## 2018-03-17 DIAGNOSIS — G8102 Flaccid hemiplegia affecting left dominant side: Secondary | ICD-10-CM | POA: Diagnosis not present

## 2018-03-17 DIAGNOSIS — R4182 Altered mental status, unspecified: Secondary | ICD-10-CM | POA: Diagnosis not present

## 2018-03-22 DIAGNOSIS — I63331 Cerebral infarction due to thrombosis of right posterior cerebral artery: Secondary | ICD-10-CM | POA: Diagnosis not present

## 2018-03-22 DIAGNOSIS — R4182 Altered mental status, unspecified: Secondary | ICD-10-CM | POA: Diagnosis not present

## 2018-03-22 DIAGNOSIS — D45 Polycythemia vera: Secondary | ICD-10-CM | POA: Diagnosis not present

## 2018-03-22 DIAGNOSIS — H53452 Other localized visual field defect, left eye: Secondary | ICD-10-CM | POA: Diagnosis not present

## 2018-03-22 DIAGNOSIS — I672 Cerebral atherosclerosis: Secondary | ICD-10-CM | POA: Diagnosis not present

## 2018-03-22 DIAGNOSIS — G8102 Flaccid hemiplegia affecting left dominant side: Secondary | ICD-10-CM | POA: Diagnosis not present

## 2018-03-31 DIAGNOSIS — R4182 Altered mental status, unspecified: Secondary | ICD-10-CM | POA: Diagnosis not present

## 2018-03-31 DIAGNOSIS — I63331 Cerebral infarction due to thrombosis of right posterior cerebral artery: Secondary | ICD-10-CM | POA: Diagnosis not present

## 2018-03-31 DIAGNOSIS — H53452 Other localized visual field defect, left eye: Secondary | ICD-10-CM | POA: Diagnosis not present

## 2018-03-31 DIAGNOSIS — D45 Polycythemia vera: Secondary | ICD-10-CM | POA: Diagnosis not present

## 2018-03-31 DIAGNOSIS — G8102 Flaccid hemiplegia affecting left dominant side: Secondary | ICD-10-CM | POA: Diagnosis not present

## 2018-03-31 DIAGNOSIS — I672 Cerebral atherosclerosis: Secondary | ICD-10-CM | POA: Diagnosis not present

## 2018-04-02 DIAGNOSIS — M1990 Unspecified osteoarthritis, unspecified site: Secondary | ICD-10-CM | POA: Diagnosis not present

## 2018-04-02 DIAGNOSIS — I672 Cerebral atherosclerosis: Secondary | ICD-10-CM | POA: Diagnosis not present

## 2018-04-02 DIAGNOSIS — I63331 Cerebral infarction due to thrombosis of right posterior cerebral artery: Secondary | ICD-10-CM | POA: Diagnosis not present

## 2018-04-02 DIAGNOSIS — G8102 Flaccid hemiplegia affecting left dominant side: Secondary | ICD-10-CM | POA: Diagnosis not present

## 2018-04-02 DIAGNOSIS — F339 Major depressive disorder, recurrent, unspecified: Secondary | ICD-10-CM | POA: Diagnosis not present

## 2018-04-02 DIAGNOSIS — R4182 Altered mental status, unspecified: Secondary | ICD-10-CM | POA: Diagnosis not present

## 2018-04-02 DIAGNOSIS — J301 Allergic rhinitis due to pollen: Secondary | ICD-10-CM | POA: Diagnosis not present

## 2018-04-02 DIAGNOSIS — G4089 Other seizures: Secondary | ICD-10-CM | POA: Diagnosis not present

## 2018-04-02 DIAGNOSIS — H53452 Other localized visual field defect, left eye: Secondary | ICD-10-CM | POA: Diagnosis not present

## 2018-04-02 DIAGNOSIS — I10 Essential (primary) hypertension: Secondary | ICD-10-CM | POA: Diagnosis not present

## 2018-04-02 DIAGNOSIS — K219 Gastro-esophageal reflux disease without esophagitis: Secondary | ICD-10-CM | POA: Diagnosis not present

## 2018-04-02 DIAGNOSIS — E039 Hypothyroidism, unspecified: Secondary | ICD-10-CM | POA: Diagnosis not present

## 2018-04-02 DIAGNOSIS — D45 Polycythemia vera: Secondary | ICD-10-CM | POA: Diagnosis not present

## 2018-04-03 DIAGNOSIS — G8102 Flaccid hemiplegia affecting left dominant side: Secondary | ICD-10-CM | POA: Diagnosis not present

## 2018-04-03 DIAGNOSIS — D45 Polycythemia vera: Secondary | ICD-10-CM | POA: Diagnosis not present

## 2018-04-03 DIAGNOSIS — R4182 Altered mental status, unspecified: Secondary | ICD-10-CM | POA: Diagnosis not present

## 2018-04-03 DIAGNOSIS — I672 Cerebral atherosclerosis: Secondary | ICD-10-CM | POA: Diagnosis not present

## 2018-04-03 DIAGNOSIS — H53452 Other localized visual field defect, left eye: Secondary | ICD-10-CM | POA: Diagnosis not present

## 2018-04-03 DIAGNOSIS — I63331 Cerebral infarction due to thrombosis of right posterior cerebral artery: Secondary | ICD-10-CM | POA: Diagnosis not present

## 2018-04-05 DIAGNOSIS — H53452 Other localized visual field defect, left eye: Secondary | ICD-10-CM | POA: Diagnosis not present

## 2018-04-05 DIAGNOSIS — R4182 Altered mental status, unspecified: Secondary | ICD-10-CM | POA: Diagnosis not present

## 2018-04-05 DIAGNOSIS — D45 Polycythemia vera: Secondary | ICD-10-CM | POA: Diagnosis not present

## 2018-04-05 DIAGNOSIS — G8102 Flaccid hemiplegia affecting left dominant side: Secondary | ICD-10-CM | POA: Diagnosis not present

## 2018-04-05 DIAGNOSIS — I63331 Cerebral infarction due to thrombosis of right posterior cerebral artery: Secondary | ICD-10-CM | POA: Diagnosis not present

## 2018-04-05 DIAGNOSIS — I672 Cerebral atherosclerosis: Secondary | ICD-10-CM | POA: Diagnosis not present

## 2018-04-12 DIAGNOSIS — D45 Polycythemia vera: Secondary | ICD-10-CM | POA: Diagnosis not present

## 2018-04-12 DIAGNOSIS — I63331 Cerebral infarction due to thrombosis of right posterior cerebral artery: Secondary | ICD-10-CM | POA: Diagnosis not present

## 2018-04-12 DIAGNOSIS — R4182 Altered mental status, unspecified: Secondary | ICD-10-CM | POA: Diagnosis not present

## 2018-04-12 DIAGNOSIS — G8102 Flaccid hemiplegia affecting left dominant side: Secondary | ICD-10-CM | POA: Diagnosis not present

## 2018-04-12 DIAGNOSIS — H53452 Other localized visual field defect, left eye: Secondary | ICD-10-CM | POA: Diagnosis not present

## 2018-04-12 DIAGNOSIS — I672 Cerebral atherosclerosis: Secondary | ICD-10-CM | POA: Diagnosis not present

## 2018-04-19 DIAGNOSIS — G8102 Flaccid hemiplegia affecting left dominant side: Secondary | ICD-10-CM | POA: Diagnosis not present

## 2018-04-19 DIAGNOSIS — D45 Polycythemia vera: Secondary | ICD-10-CM | POA: Diagnosis not present

## 2018-04-19 DIAGNOSIS — I63331 Cerebral infarction due to thrombosis of right posterior cerebral artery: Secondary | ICD-10-CM | POA: Diagnosis not present

## 2018-04-19 DIAGNOSIS — I672 Cerebral atherosclerosis: Secondary | ICD-10-CM | POA: Diagnosis not present

## 2018-04-19 DIAGNOSIS — R4182 Altered mental status, unspecified: Secondary | ICD-10-CM | POA: Diagnosis not present

## 2018-04-19 DIAGNOSIS — H53452 Other localized visual field defect, left eye: Secondary | ICD-10-CM | POA: Diagnosis not present

## 2018-05-03 DIAGNOSIS — H53452 Other localized visual field defect, left eye: Secondary | ICD-10-CM | POA: Diagnosis not present

## 2018-05-03 DIAGNOSIS — G4089 Other seizures: Secondary | ICD-10-CM | POA: Diagnosis not present

## 2018-05-03 DIAGNOSIS — I10 Essential (primary) hypertension: Secondary | ICD-10-CM | POA: Diagnosis not present

## 2018-05-03 DIAGNOSIS — M1990 Unspecified osteoarthritis, unspecified site: Secondary | ICD-10-CM | POA: Diagnosis not present

## 2018-05-03 DIAGNOSIS — R4182 Altered mental status, unspecified: Secondary | ICD-10-CM | POA: Diagnosis not present

## 2018-05-03 DIAGNOSIS — E039 Hypothyroidism, unspecified: Secondary | ICD-10-CM | POA: Diagnosis not present

## 2018-05-03 DIAGNOSIS — I672 Cerebral atherosclerosis: Secondary | ICD-10-CM | POA: Diagnosis not present

## 2018-05-03 DIAGNOSIS — F339 Major depressive disorder, recurrent, unspecified: Secondary | ICD-10-CM | POA: Diagnosis not present

## 2018-05-03 DIAGNOSIS — K219 Gastro-esophageal reflux disease without esophagitis: Secondary | ICD-10-CM | POA: Diagnosis not present

## 2018-05-03 DIAGNOSIS — G8102 Flaccid hemiplegia affecting left dominant side: Secondary | ICD-10-CM | POA: Diagnosis not present

## 2018-05-03 DIAGNOSIS — I63331 Cerebral infarction due to thrombosis of right posterior cerebral artery: Secondary | ICD-10-CM | POA: Diagnosis not present

## 2018-05-03 DIAGNOSIS — J301 Allergic rhinitis due to pollen: Secondary | ICD-10-CM | POA: Diagnosis not present

## 2018-05-03 DIAGNOSIS — D45 Polycythemia vera: Secondary | ICD-10-CM | POA: Diagnosis not present

## 2018-06-02 DIAGNOSIS — G40909 Epilepsy, unspecified, not intractable, without status epilepticus: Secondary | ICD-10-CM | POA: Diagnosis not present

## 2018-06-02 DIAGNOSIS — E039 Hypothyroidism, unspecified: Secondary | ICD-10-CM | POA: Diagnosis not present

## 2018-06-02 DIAGNOSIS — I69354 Hemiplegia and hemiparesis following cerebral infarction affecting left non-dominant side: Secondary | ICD-10-CM | POA: Diagnosis not present

## 2018-06-02 DIAGNOSIS — D45 Polycythemia vera: Secondary | ICD-10-CM | POA: Diagnosis not present

## 2018-06-02 DIAGNOSIS — R4182 Altered mental status, unspecified: Secondary | ICD-10-CM | POA: Diagnosis not present

## 2018-06-02 DIAGNOSIS — M1990 Unspecified osteoarthritis, unspecified site: Secondary | ICD-10-CM | POA: Diagnosis not present

## 2018-06-02 DIAGNOSIS — H53462 Homonymous bilateral field defects, left side: Secondary | ICD-10-CM | POA: Diagnosis not present

## 2018-06-02 DIAGNOSIS — F329 Major depressive disorder, single episode, unspecified: Secondary | ICD-10-CM | POA: Diagnosis not present

## 2018-06-02 DIAGNOSIS — J302 Other seasonal allergic rhinitis: Secondary | ICD-10-CM | POA: Diagnosis not present

## 2018-06-02 DIAGNOSIS — I10 Essential (primary) hypertension: Secondary | ICD-10-CM | POA: Diagnosis not present

## 2018-06-02 DIAGNOSIS — K219 Gastro-esophageal reflux disease without esophagitis: Secondary | ICD-10-CM | POA: Diagnosis not present

## 2018-06-11 ENCOUNTER — Encounter

## 2018-06-28 DIAGNOSIS — I10 Essential (primary) hypertension: Secondary | ICD-10-CM | POA: Diagnosis not present

## 2018-06-28 DIAGNOSIS — R4182 Altered mental status, unspecified: Secondary | ICD-10-CM | POA: Diagnosis not present

## 2018-06-28 DIAGNOSIS — D45 Polycythemia vera: Secondary | ICD-10-CM | POA: Diagnosis not present

## 2018-06-28 DIAGNOSIS — H53462 Homonymous bilateral field defects, left side: Secondary | ICD-10-CM | POA: Diagnosis not present

## 2018-06-28 DIAGNOSIS — G40909 Epilepsy, unspecified, not intractable, without status epilepticus: Secondary | ICD-10-CM | POA: Diagnosis not present

## 2018-06-28 DIAGNOSIS — I69354 Hemiplegia and hemiparesis following cerebral infarction affecting left non-dominant side: Secondary | ICD-10-CM | POA: Diagnosis not present

## 2018-07-03 DIAGNOSIS — F329 Major depressive disorder, single episode, unspecified: Secondary | ICD-10-CM | POA: Diagnosis not present

## 2018-07-03 DIAGNOSIS — K219 Gastro-esophageal reflux disease without esophagitis: Secondary | ICD-10-CM | POA: Diagnosis not present

## 2018-07-03 DIAGNOSIS — D45 Polycythemia vera: Secondary | ICD-10-CM | POA: Diagnosis not present

## 2018-07-03 DIAGNOSIS — E039 Hypothyroidism, unspecified: Secondary | ICD-10-CM | POA: Diagnosis not present

## 2018-07-03 DIAGNOSIS — H53462 Homonymous bilateral field defects, left side: Secondary | ICD-10-CM | POA: Diagnosis not present

## 2018-07-03 DIAGNOSIS — I69354 Hemiplegia and hemiparesis following cerebral infarction affecting left non-dominant side: Secondary | ICD-10-CM | POA: Diagnosis not present

## 2018-07-03 DIAGNOSIS — R4182 Altered mental status, unspecified: Secondary | ICD-10-CM | POA: Diagnosis not present

## 2018-07-03 DIAGNOSIS — J302 Other seasonal allergic rhinitis: Secondary | ICD-10-CM | POA: Diagnosis not present

## 2018-07-03 DIAGNOSIS — I10 Essential (primary) hypertension: Secondary | ICD-10-CM | POA: Diagnosis not present

## 2018-07-03 DIAGNOSIS — M1990 Unspecified osteoarthritis, unspecified site: Secondary | ICD-10-CM | POA: Diagnosis not present

## 2018-07-03 DIAGNOSIS — G40909 Epilepsy, unspecified, not intractable, without status epilepticus: Secondary | ICD-10-CM | POA: Diagnosis not present

## 2018-07-12 DIAGNOSIS — G40909 Epilepsy, unspecified, not intractable, without status epilepticus: Secondary | ICD-10-CM | POA: Diagnosis not present

## 2018-07-12 DIAGNOSIS — R4182 Altered mental status, unspecified: Secondary | ICD-10-CM | POA: Diagnosis not present

## 2018-07-12 DIAGNOSIS — I10 Essential (primary) hypertension: Secondary | ICD-10-CM | POA: Diagnosis not present

## 2018-07-12 DIAGNOSIS — D45 Polycythemia vera: Secondary | ICD-10-CM | POA: Diagnosis not present

## 2018-07-12 DIAGNOSIS — I69354 Hemiplegia and hemiparesis following cerebral infarction affecting left non-dominant side: Secondary | ICD-10-CM | POA: Diagnosis not present

## 2018-07-12 DIAGNOSIS — H53462 Homonymous bilateral field defects, left side: Secondary | ICD-10-CM | POA: Diagnosis not present

## 2018-07-26 DIAGNOSIS — I69354 Hemiplegia and hemiparesis following cerebral infarction affecting left non-dominant side: Secondary | ICD-10-CM | POA: Diagnosis not present

## 2018-07-26 DIAGNOSIS — H53462 Homonymous bilateral field defects, left side: Secondary | ICD-10-CM | POA: Diagnosis not present

## 2018-07-26 DIAGNOSIS — G40909 Epilepsy, unspecified, not intractable, without status epilepticus: Secondary | ICD-10-CM | POA: Diagnosis not present

## 2018-07-26 DIAGNOSIS — D45 Polycythemia vera: Secondary | ICD-10-CM | POA: Diagnosis not present

## 2018-07-26 DIAGNOSIS — R4182 Altered mental status, unspecified: Secondary | ICD-10-CM | POA: Diagnosis not present

## 2018-07-26 DIAGNOSIS — I10 Essential (primary) hypertension: Secondary | ICD-10-CM | POA: Diagnosis not present

## 2018-08-02 DIAGNOSIS — R443 Hallucinations, unspecified: Secondary | ICD-10-CM | POA: Diagnosis not present

## 2018-08-02 DIAGNOSIS — F329 Major depressive disorder, single episode, unspecified: Secondary | ICD-10-CM | POA: Diagnosis not present

## 2018-08-02 DIAGNOSIS — I10 Essential (primary) hypertension: Secondary | ICD-10-CM | POA: Diagnosis not present

## 2018-08-02 DIAGNOSIS — K219 Gastro-esophageal reflux disease without esophagitis: Secondary | ICD-10-CM | POA: Diagnosis not present

## 2018-08-02 DIAGNOSIS — G40909 Epilepsy, unspecified, not intractable, without status epilepticus: Secondary | ICD-10-CM | POA: Diagnosis not present

## 2018-08-02 DIAGNOSIS — Z741 Need for assistance with personal care: Secondary | ICD-10-CM | POA: Diagnosis not present

## 2018-08-02 DIAGNOSIS — E039 Hypothyroidism, unspecified: Secondary | ICD-10-CM | POA: Diagnosis not present

## 2018-08-02 DIAGNOSIS — J302 Other seasonal allergic rhinitis: Secondary | ICD-10-CM | POA: Diagnosis not present

## 2018-08-02 DIAGNOSIS — R4182 Altered mental status, unspecified: Secondary | ICD-10-CM | POA: Diagnosis not present

## 2018-08-02 DIAGNOSIS — I69354 Hemiplegia and hemiparesis following cerebral infarction affecting left non-dominant side: Secondary | ICD-10-CM | POA: Diagnosis not present

## 2018-08-02 DIAGNOSIS — D45 Polycythemia vera: Secondary | ICD-10-CM | POA: Diagnosis not present

## 2018-08-02 DIAGNOSIS — M1990 Unspecified osteoarthritis, unspecified site: Secondary | ICD-10-CM | POA: Diagnosis not present

## 2018-08-02 DIAGNOSIS — H53462 Homonymous bilateral field defects, left side: Secondary | ICD-10-CM | POA: Diagnosis not present

## 2018-08-11 DIAGNOSIS — R4182 Altered mental status, unspecified: Secondary | ICD-10-CM | POA: Diagnosis not present

## 2018-08-11 DIAGNOSIS — H53462 Homonymous bilateral field defects, left side: Secondary | ICD-10-CM | POA: Diagnosis not present

## 2018-08-11 DIAGNOSIS — D45 Polycythemia vera: Secondary | ICD-10-CM | POA: Diagnosis not present

## 2018-08-11 DIAGNOSIS — F329 Major depressive disorder, single episode, unspecified: Secondary | ICD-10-CM | POA: Diagnosis not present

## 2018-08-11 DIAGNOSIS — I69354 Hemiplegia and hemiparesis following cerebral infarction affecting left non-dominant side: Secondary | ICD-10-CM | POA: Diagnosis not present

## 2018-08-11 DIAGNOSIS — G40909 Epilepsy, unspecified, not intractable, without status epilepticus: Secondary | ICD-10-CM | POA: Diagnosis not present

## 2018-08-18 DIAGNOSIS — H53462 Homonymous bilateral field defects, left side: Secondary | ICD-10-CM | POA: Diagnosis not present

## 2018-08-18 DIAGNOSIS — F329 Major depressive disorder, single episode, unspecified: Secondary | ICD-10-CM | POA: Diagnosis not present

## 2018-08-18 DIAGNOSIS — I69354 Hemiplegia and hemiparesis following cerebral infarction affecting left non-dominant side: Secondary | ICD-10-CM | POA: Diagnosis not present

## 2018-08-18 DIAGNOSIS — D45 Polycythemia vera: Secondary | ICD-10-CM | POA: Diagnosis not present

## 2018-08-18 DIAGNOSIS — G40909 Epilepsy, unspecified, not intractable, without status epilepticus: Secondary | ICD-10-CM | POA: Diagnosis not present

## 2018-08-18 DIAGNOSIS — R4182 Altered mental status, unspecified: Secondary | ICD-10-CM | POA: Diagnosis not present

## 2018-08-24 DIAGNOSIS — F329 Major depressive disorder, single episode, unspecified: Secondary | ICD-10-CM | POA: Diagnosis not present

## 2018-08-24 DIAGNOSIS — R4182 Altered mental status, unspecified: Secondary | ICD-10-CM | POA: Diagnosis not present

## 2018-08-24 DIAGNOSIS — I69354 Hemiplegia and hemiparesis following cerebral infarction affecting left non-dominant side: Secondary | ICD-10-CM | POA: Diagnosis not present

## 2018-08-24 DIAGNOSIS — G40909 Epilepsy, unspecified, not intractable, without status epilepticus: Secondary | ICD-10-CM | POA: Diagnosis not present

## 2018-08-24 DIAGNOSIS — H53462 Homonymous bilateral field defects, left side: Secondary | ICD-10-CM | POA: Diagnosis not present

## 2018-08-24 DIAGNOSIS — D45 Polycythemia vera: Secondary | ICD-10-CM | POA: Diagnosis not present

## 2018-08-31 ENCOUNTER — Other Ambulatory Visit: Payer: Self-pay

## 2018-09-01 DIAGNOSIS — R4182 Altered mental status, unspecified: Secondary | ICD-10-CM | POA: Diagnosis not present

## 2018-09-01 DIAGNOSIS — I69354 Hemiplegia and hemiparesis following cerebral infarction affecting left non-dominant side: Secondary | ICD-10-CM | POA: Diagnosis not present

## 2018-09-01 DIAGNOSIS — F329 Major depressive disorder, single episode, unspecified: Secondary | ICD-10-CM | POA: Diagnosis not present

## 2018-09-01 DIAGNOSIS — G40909 Epilepsy, unspecified, not intractable, without status epilepticus: Secondary | ICD-10-CM | POA: Diagnosis not present

## 2018-09-01 DIAGNOSIS — D45 Polycythemia vera: Secondary | ICD-10-CM | POA: Diagnosis not present

## 2018-09-01 DIAGNOSIS — H53462 Homonymous bilateral field defects, left side: Secondary | ICD-10-CM | POA: Diagnosis not present

## 2018-09-02 DIAGNOSIS — I69354 Hemiplegia and hemiparesis following cerebral infarction affecting left non-dominant side: Secondary | ICD-10-CM | POA: Diagnosis not present

## 2018-09-02 DIAGNOSIS — K219 Gastro-esophageal reflux disease without esophagitis: Secondary | ICD-10-CM | POA: Diagnosis not present

## 2018-09-02 DIAGNOSIS — M1990 Unspecified osteoarthritis, unspecified site: Secondary | ICD-10-CM | POA: Diagnosis not present

## 2018-09-02 DIAGNOSIS — Z741 Need for assistance with personal care: Secondary | ICD-10-CM | POA: Diagnosis not present

## 2018-09-02 DIAGNOSIS — D45 Polycythemia vera: Secondary | ICD-10-CM | POA: Diagnosis not present

## 2018-09-02 DIAGNOSIS — H53462 Homonymous bilateral field defects, left side: Secondary | ICD-10-CM | POA: Diagnosis not present

## 2018-09-02 DIAGNOSIS — F329 Major depressive disorder, single episode, unspecified: Secondary | ICD-10-CM | POA: Diagnosis not present

## 2018-09-02 DIAGNOSIS — J302 Other seasonal allergic rhinitis: Secondary | ICD-10-CM | POA: Diagnosis not present

## 2018-09-02 DIAGNOSIS — R443 Hallucinations, unspecified: Secondary | ICD-10-CM | POA: Diagnosis not present

## 2018-09-02 DIAGNOSIS — R4182 Altered mental status, unspecified: Secondary | ICD-10-CM | POA: Diagnosis not present

## 2018-09-02 DIAGNOSIS — G40909 Epilepsy, unspecified, not intractable, without status epilepticus: Secondary | ICD-10-CM | POA: Diagnosis not present

## 2018-09-02 DIAGNOSIS — E039 Hypothyroidism, unspecified: Secondary | ICD-10-CM | POA: Diagnosis not present

## 2018-09-02 DIAGNOSIS — I10 Essential (primary) hypertension: Secondary | ICD-10-CM | POA: Diagnosis not present

## 2018-09-08 DIAGNOSIS — D45 Polycythemia vera: Secondary | ICD-10-CM | POA: Diagnosis not present

## 2018-09-08 DIAGNOSIS — F329 Major depressive disorder, single episode, unspecified: Secondary | ICD-10-CM | POA: Diagnosis not present

## 2018-09-08 DIAGNOSIS — I69354 Hemiplegia and hemiparesis following cerebral infarction affecting left non-dominant side: Secondary | ICD-10-CM | POA: Diagnosis not present

## 2018-09-08 DIAGNOSIS — R4182 Altered mental status, unspecified: Secondary | ICD-10-CM | POA: Diagnosis not present

## 2018-09-08 DIAGNOSIS — H53462 Homonymous bilateral field defects, left side: Secondary | ICD-10-CM | POA: Diagnosis not present

## 2018-09-08 DIAGNOSIS — G40909 Epilepsy, unspecified, not intractable, without status epilepticus: Secondary | ICD-10-CM | POA: Diagnosis not present

## 2018-09-13 DIAGNOSIS — F329 Major depressive disorder, single episode, unspecified: Secondary | ICD-10-CM | POA: Diagnosis not present

## 2018-09-13 DIAGNOSIS — I69354 Hemiplegia and hemiparesis following cerebral infarction affecting left non-dominant side: Secondary | ICD-10-CM | POA: Diagnosis not present

## 2018-09-13 DIAGNOSIS — G40909 Epilepsy, unspecified, not intractable, without status epilepticus: Secondary | ICD-10-CM | POA: Diagnosis not present

## 2018-09-13 DIAGNOSIS — D45 Polycythemia vera: Secondary | ICD-10-CM | POA: Diagnosis not present

## 2018-09-13 DIAGNOSIS — R4182 Altered mental status, unspecified: Secondary | ICD-10-CM | POA: Diagnosis not present

## 2018-09-13 DIAGNOSIS — H53462 Homonymous bilateral field defects, left side: Secondary | ICD-10-CM | POA: Diagnosis not present

## 2018-09-21 DIAGNOSIS — D45 Polycythemia vera: Secondary | ICD-10-CM | POA: Diagnosis not present

## 2018-09-21 DIAGNOSIS — R4182 Altered mental status, unspecified: Secondary | ICD-10-CM | POA: Diagnosis not present

## 2018-09-21 DIAGNOSIS — G40909 Epilepsy, unspecified, not intractable, without status epilepticus: Secondary | ICD-10-CM | POA: Diagnosis not present

## 2018-09-21 DIAGNOSIS — I69354 Hemiplegia and hemiparesis following cerebral infarction affecting left non-dominant side: Secondary | ICD-10-CM | POA: Diagnosis not present

## 2018-09-21 DIAGNOSIS — H53462 Homonymous bilateral field defects, left side: Secondary | ICD-10-CM | POA: Diagnosis not present

## 2018-09-21 DIAGNOSIS — F329 Major depressive disorder, single episode, unspecified: Secondary | ICD-10-CM | POA: Diagnosis not present

## 2018-09-29 DIAGNOSIS — G40909 Epilepsy, unspecified, not intractable, without status epilepticus: Secondary | ICD-10-CM | POA: Diagnosis not present

## 2018-09-29 DIAGNOSIS — H53462 Homonymous bilateral field defects, left side: Secondary | ICD-10-CM | POA: Diagnosis not present

## 2018-09-29 DIAGNOSIS — F329 Major depressive disorder, single episode, unspecified: Secondary | ICD-10-CM | POA: Diagnosis not present

## 2018-09-29 DIAGNOSIS — R4182 Altered mental status, unspecified: Secondary | ICD-10-CM | POA: Diagnosis not present

## 2018-09-29 DIAGNOSIS — D45 Polycythemia vera: Secondary | ICD-10-CM | POA: Diagnosis not present

## 2018-09-29 DIAGNOSIS — I69354 Hemiplegia and hemiparesis following cerebral infarction affecting left non-dominant side: Secondary | ICD-10-CM | POA: Diagnosis not present

## 2018-10-03 DIAGNOSIS — E039 Hypothyroidism, unspecified: Secondary | ICD-10-CM | POA: Diagnosis not present

## 2018-10-03 DIAGNOSIS — J302 Other seasonal allergic rhinitis: Secondary | ICD-10-CM | POA: Diagnosis not present

## 2018-10-03 DIAGNOSIS — D45 Polycythemia vera: Secondary | ICD-10-CM | POA: Diagnosis not present

## 2018-10-03 DIAGNOSIS — F329 Major depressive disorder, single episode, unspecified: Secondary | ICD-10-CM | POA: Diagnosis not present

## 2018-10-03 DIAGNOSIS — K219 Gastro-esophageal reflux disease without esophagitis: Secondary | ICD-10-CM | POA: Diagnosis not present

## 2018-10-03 DIAGNOSIS — M1990 Unspecified osteoarthritis, unspecified site: Secondary | ICD-10-CM | POA: Diagnosis not present

## 2018-10-03 DIAGNOSIS — R4182 Altered mental status, unspecified: Secondary | ICD-10-CM | POA: Diagnosis not present

## 2018-10-03 DIAGNOSIS — Z741 Need for assistance with personal care: Secondary | ICD-10-CM | POA: Diagnosis not present

## 2018-10-03 DIAGNOSIS — I10 Essential (primary) hypertension: Secondary | ICD-10-CM | POA: Diagnosis not present

## 2018-10-03 DIAGNOSIS — G40909 Epilepsy, unspecified, not intractable, without status epilepticus: Secondary | ICD-10-CM | POA: Diagnosis not present

## 2018-10-03 DIAGNOSIS — H53462 Homonymous bilateral field defects, left side: Secondary | ICD-10-CM | POA: Diagnosis not present

## 2018-10-03 DIAGNOSIS — R443 Hallucinations, unspecified: Secondary | ICD-10-CM | POA: Diagnosis not present

## 2018-10-03 DIAGNOSIS — I69354 Hemiplegia and hemiparesis following cerebral infarction affecting left non-dominant side: Secondary | ICD-10-CM | POA: Diagnosis not present

## 2018-10-12 DIAGNOSIS — D45 Polycythemia vera: Secondary | ICD-10-CM | POA: Diagnosis not present

## 2018-10-12 DIAGNOSIS — F329 Major depressive disorder, single episode, unspecified: Secondary | ICD-10-CM | POA: Diagnosis not present

## 2018-10-12 DIAGNOSIS — G40909 Epilepsy, unspecified, not intractable, without status epilepticus: Secondary | ICD-10-CM | POA: Diagnosis not present

## 2018-10-12 DIAGNOSIS — H53462 Homonymous bilateral field defects, left side: Secondary | ICD-10-CM | POA: Diagnosis not present

## 2018-10-12 DIAGNOSIS — R4182 Altered mental status, unspecified: Secondary | ICD-10-CM | POA: Diagnosis not present

## 2018-10-12 DIAGNOSIS — I69354 Hemiplegia and hemiparesis following cerebral infarction affecting left non-dominant side: Secondary | ICD-10-CM | POA: Diagnosis not present

## 2018-10-20 DIAGNOSIS — G40909 Epilepsy, unspecified, not intractable, without status epilepticus: Secondary | ICD-10-CM | POA: Diagnosis not present

## 2018-10-20 DIAGNOSIS — R4182 Altered mental status, unspecified: Secondary | ICD-10-CM | POA: Diagnosis not present

## 2018-10-20 DIAGNOSIS — H53462 Homonymous bilateral field defects, left side: Secondary | ICD-10-CM | POA: Diagnosis not present

## 2018-10-20 DIAGNOSIS — I69354 Hemiplegia and hemiparesis following cerebral infarction affecting left non-dominant side: Secondary | ICD-10-CM | POA: Diagnosis not present

## 2018-10-20 DIAGNOSIS — D45 Polycythemia vera: Secondary | ICD-10-CM | POA: Diagnosis not present

## 2018-10-20 DIAGNOSIS — F329 Major depressive disorder, single episode, unspecified: Secondary | ICD-10-CM | POA: Diagnosis not present

## 2018-10-26 DIAGNOSIS — R4182 Altered mental status, unspecified: Secondary | ICD-10-CM | POA: Diagnosis not present

## 2018-10-26 DIAGNOSIS — H53462 Homonymous bilateral field defects, left side: Secondary | ICD-10-CM | POA: Diagnosis not present

## 2018-10-26 DIAGNOSIS — F329 Major depressive disorder, single episode, unspecified: Secondary | ICD-10-CM | POA: Diagnosis not present

## 2018-10-26 DIAGNOSIS — I69354 Hemiplegia and hemiparesis following cerebral infarction affecting left non-dominant side: Secondary | ICD-10-CM | POA: Diagnosis not present

## 2018-10-26 DIAGNOSIS — G40909 Epilepsy, unspecified, not intractable, without status epilepticus: Secondary | ICD-10-CM | POA: Diagnosis not present

## 2018-10-26 DIAGNOSIS — D45 Polycythemia vera: Secondary | ICD-10-CM | POA: Diagnosis not present

## 2018-11-02 DIAGNOSIS — I10 Essential (primary) hypertension: Secondary | ICD-10-CM | POA: Diagnosis not present

## 2018-11-02 DIAGNOSIS — F329 Major depressive disorder, single episode, unspecified: Secondary | ICD-10-CM | POA: Diagnosis not present

## 2018-11-02 DIAGNOSIS — E039 Hypothyroidism, unspecified: Secondary | ICD-10-CM | POA: Diagnosis not present

## 2018-11-02 DIAGNOSIS — R443 Hallucinations, unspecified: Secondary | ICD-10-CM | POA: Diagnosis not present

## 2018-11-02 DIAGNOSIS — K219 Gastro-esophageal reflux disease without esophagitis: Secondary | ICD-10-CM | POA: Diagnosis not present

## 2018-11-02 DIAGNOSIS — I69354 Hemiplegia and hemiparesis following cerebral infarction affecting left non-dominant side: Secondary | ICD-10-CM | POA: Diagnosis not present

## 2018-11-02 DIAGNOSIS — J302 Other seasonal allergic rhinitis: Secondary | ICD-10-CM | POA: Diagnosis not present

## 2018-11-02 DIAGNOSIS — D45 Polycythemia vera: Secondary | ICD-10-CM | POA: Diagnosis not present

## 2018-11-02 DIAGNOSIS — H53462 Homonymous bilateral field defects, left side: Secondary | ICD-10-CM | POA: Diagnosis not present

## 2018-11-02 DIAGNOSIS — M1990 Unspecified osteoarthritis, unspecified site: Secondary | ICD-10-CM | POA: Diagnosis not present

## 2018-11-02 DIAGNOSIS — R4182 Altered mental status, unspecified: Secondary | ICD-10-CM | POA: Diagnosis not present

## 2018-11-02 DIAGNOSIS — Z741 Need for assistance with personal care: Secondary | ICD-10-CM | POA: Diagnosis not present

## 2018-11-02 DIAGNOSIS — G40909 Epilepsy, unspecified, not intractable, without status epilepticus: Secondary | ICD-10-CM | POA: Diagnosis not present

## 2018-11-03 DIAGNOSIS — G40909 Epilepsy, unspecified, not intractable, without status epilepticus: Secondary | ICD-10-CM | POA: Diagnosis not present

## 2018-11-03 DIAGNOSIS — F329 Major depressive disorder, single episode, unspecified: Secondary | ICD-10-CM | POA: Diagnosis not present

## 2018-11-03 DIAGNOSIS — H53462 Homonymous bilateral field defects, left side: Secondary | ICD-10-CM | POA: Diagnosis not present

## 2018-11-03 DIAGNOSIS — I69354 Hemiplegia and hemiparesis following cerebral infarction affecting left non-dominant side: Secondary | ICD-10-CM | POA: Diagnosis not present

## 2018-11-03 DIAGNOSIS — R4182 Altered mental status, unspecified: Secondary | ICD-10-CM | POA: Diagnosis not present

## 2018-11-03 DIAGNOSIS — D45 Polycythemia vera: Secondary | ICD-10-CM | POA: Diagnosis not present

## 2018-11-09 DIAGNOSIS — I69354 Hemiplegia and hemiparesis following cerebral infarction affecting left non-dominant side: Secondary | ICD-10-CM | POA: Diagnosis not present

## 2018-11-09 DIAGNOSIS — H53462 Homonymous bilateral field defects, left side: Secondary | ICD-10-CM | POA: Diagnosis not present

## 2018-11-09 DIAGNOSIS — D45 Polycythemia vera: Secondary | ICD-10-CM | POA: Diagnosis not present

## 2018-11-09 DIAGNOSIS — F329 Major depressive disorder, single episode, unspecified: Secondary | ICD-10-CM | POA: Diagnosis not present

## 2018-11-09 DIAGNOSIS — R4182 Altered mental status, unspecified: Secondary | ICD-10-CM | POA: Diagnosis not present

## 2018-11-09 DIAGNOSIS — G40909 Epilepsy, unspecified, not intractable, without status epilepticus: Secondary | ICD-10-CM | POA: Diagnosis not present

## 2018-11-17 DIAGNOSIS — F329 Major depressive disorder, single episode, unspecified: Secondary | ICD-10-CM | POA: Diagnosis not present

## 2018-11-17 DIAGNOSIS — G40909 Epilepsy, unspecified, not intractable, without status epilepticus: Secondary | ICD-10-CM | POA: Diagnosis not present

## 2018-11-17 DIAGNOSIS — H53462 Homonymous bilateral field defects, left side: Secondary | ICD-10-CM | POA: Diagnosis not present

## 2018-11-17 DIAGNOSIS — I69354 Hemiplegia and hemiparesis following cerebral infarction affecting left non-dominant side: Secondary | ICD-10-CM | POA: Diagnosis not present

## 2018-11-17 DIAGNOSIS — R4182 Altered mental status, unspecified: Secondary | ICD-10-CM | POA: Diagnosis not present

## 2018-11-17 DIAGNOSIS — D45 Polycythemia vera: Secondary | ICD-10-CM | POA: Diagnosis not present

## 2018-11-24 DIAGNOSIS — H53462 Homonymous bilateral field defects, left side: Secondary | ICD-10-CM | POA: Diagnosis not present

## 2018-11-24 DIAGNOSIS — G40909 Epilepsy, unspecified, not intractable, without status epilepticus: Secondary | ICD-10-CM | POA: Diagnosis not present

## 2018-11-24 DIAGNOSIS — R4182 Altered mental status, unspecified: Secondary | ICD-10-CM | POA: Diagnosis not present

## 2018-11-24 DIAGNOSIS — F329 Major depressive disorder, single episode, unspecified: Secondary | ICD-10-CM | POA: Diagnosis not present

## 2018-11-24 DIAGNOSIS — D45 Polycythemia vera: Secondary | ICD-10-CM | POA: Diagnosis not present

## 2018-11-24 DIAGNOSIS — I69354 Hemiplegia and hemiparesis following cerebral infarction affecting left non-dominant side: Secondary | ICD-10-CM | POA: Diagnosis not present

## 2018-12-03 DIAGNOSIS — H53462 Homonymous bilateral field defects, left side: Secondary | ICD-10-CM | POA: Diagnosis not present

## 2018-12-03 DIAGNOSIS — I69354 Hemiplegia and hemiparesis following cerebral infarction affecting left non-dominant side: Secondary | ICD-10-CM | POA: Diagnosis not present

## 2018-12-03 DIAGNOSIS — F329 Major depressive disorder, single episode, unspecified: Secondary | ICD-10-CM | POA: Diagnosis not present

## 2018-12-03 DIAGNOSIS — M21371 Foot drop, right foot: Secondary | ICD-10-CM | POA: Diagnosis not present

## 2018-12-03 DIAGNOSIS — R4182 Altered mental status, unspecified: Secondary | ICD-10-CM | POA: Diagnosis not present

## 2018-12-03 DIAGNOSIS — K219 Gastro-esophageal reflux disease without esophagitis: Secondary | ICD-10-CM | POA: Diagnosis not present

## 2018-12-03 DIAGNOSIS — J302 Other seasonal allergic rhinitis: Secondary | ICD-10-CM | POA: Diagnosis not present

## 2018-12-03 DIAGNOSIS — G40909 Epilepsy, unspecified, not intractable, without status epilepticus: Secondary | ICD-10-CM | POA: Diagnosis not present

## 2018-12-03 DIAGNOSIS — I10 Essential (primary) hypertension: Secondary | ICD-10-CM | POA: Diagnosis not present

## 2018-12-03 DIAGNOSIS — M1990 Unspecified osteoarthritis, unspecified site: Secondary | ICD-10-CM | POA: Diagnosis not present

## 2018-12-03 DIAGNOSIS — R64 Cachexia: Secondary | ICD-10-CM | POA: Diagnosis not present

## 2018-12-03 DIAGNOSIS — R443 Hallucinations, unspecified: Secondary | ICD-10-CM | POA: Diagnosis not present

## 2018-12-03 DIAGNOSIS — D45 Polycythemia vera: Secondary | ICD-10-CM | POA: Diagnosis not present

## 2018-12-03 DIAGNOSIS — Z741 Need for assistance with personal care: Secondary | ICD-10-CM | POA: Diagnosis not present

## 2018-12-03 DIAGNOSIS — Z7401 Bed confinement status: Secondary | ICD-10-CM | POA: Diagnosis not present

## 2018-12-03 DIAGNOSIS — E039 Hypothyroidism, unspecified: Secondary | ICD-10-CM | POA: Diagnosis not present

## 2018-12-03 DIAGNOSIS — M21372 Foot drop, left foot: Secondary | ICD-10-CM | POA: Diagnosis not present

## 2018-12-04 DIAGNOSIS — I69354 Hemiplegia and hemiparesis following cerebral infarction affecting left non-dominant side: Secondary | ICD-10-CM | POA: Diagnosis not present

## 2018-12-04 DIAGNOSIS — F329 Major depressive disorder, single episode, unspecified: Secondary | ICD-10-CM | POA: Diagnosis not present

## 2018-12-04 DIAGNOSIS — R4182 Altered mental status, unspecified: Secondary | ICD-10-CM | POA: Diagnosis not present

## 2018-12-04 DIAGNOSIS — G40909 Epilepsy, unspecified, not intractable, without status epilepticus: Secondary | ICD-10-CM | POA: Diagnosis not present

## 2018-12-04 DIAGNOSIS — I10 Essential (primary) hypertension: Secondary | ICD-10-CM | POA: Diagnosis not present

## 2018-12-04 DIAGNOSIS — D45 Polycythemia vera: Secondary | ICD-10-CM | POA: Diagnosis not present

## 2018-12-05 DIAGNOSIS — G40909 Epilepsy, unspecified, not intractable, without status epilepticus: Secondary | ICD-10-CM | POA: Diagnosis not present

## 2018-12-05 DIAGNOSIS — F329 Major depressive disorder, single episode, unspecified: Secondary | ICD-10-CM | POA: Diagnosis not present

## 2018-12-05 DIAGNOSIS — R4182 Altered mental status, unspecified: Secondary | ICD-10-CM | POA: Diagnosis not present

## 2018-12-05 DIAGNOSIS — I10 Essential (primary) hypertension: Secondary | ICD-10-CM | POA: Diagnosis not present

## 2018-12-05 DIAGNOSIS — I69354 Hemiplegia and hemiparesis following cerebral infarction affecting left non-dominant side: Secondary | ICD-10-CM | POA: Diagnosis not present

## 2018-12-05 DIAGNOSIS — D45 Polycythemia vera: Secondary | ICD-10-CM | POA: Diagnosis not present

## 2018-12-15 DIAGNOSIS — R4182 Altered mental status, unspecified: Secondary | ICD-10-CM | POA: Diagnosis not present

## 2018-12-15 DIAGNOSIS — I69354 Hemiplegia and hemiparesis following cerebral infarction affecting left non-dominant side: Secondary | ICD-10-CM | POA: Diagnosis not present

## 2018-12-15 DIAGNOSIS — F329 Major depressive disorder, single episode, unspecified: Secondary | ICD-10-CM | POA: Diagnosis not present

## 2018-12-15 DIAGNOSIS — I10 Essential (primary) hypertension: Secondary | ICD-10-CM | POA: Diagnosis not present

## 2018-12-15 DIAGNOSIS — D45 Polycythemia vera: Secondary | ICD-10-CM | POA: Diagnosis not present

## 2018-12-15 DIAGNOSIS — G40909 Epilepsy, unspecified, not intractable, without status epilepticus: Secondary | ICD-10-CM | POA: Diagnosis not present

## 2018-12-21 DIAGNOSIS — I10 Essential (primary) hypertension: Secondary | ICD-10-CM | POA: Diagnosis not present

## 2018-12-21 DIAGNOSIS — D45 Polycythemia vera: Secondary | ICD-10-CM | POA: Diagnosis not present

## 2018-12-21 DIAGNOSIS — I69354 Hemiplegia and hemiparesis following cerebral infarction affecting left non-dominant side: Secondary | ICD-10-CM | POA: Diagnosis not present

## 2018-12-21 DIAGNOSIS — G40909 Epilepsy, unspecified, not intractable, without status epilepticus: Secondary | ICD-10-CM | POA: Diagnosis not present

## 2018-12-21 DIAGNOSIS — R4182 Altered mental status, unspecified: Secondary | ICD-10-CM | POA: Diagnosis not present

## 2018-12-21 DIAGNOSIS — F329 Major depressive disorder, single episode, unspecified: Secondary | ICD-10-CM | POA: Diagnosis not present

## 2018-12-29 DIAGNOSIS — R4182 Altered mental status, unspecified: Secondary | ICD-10-CM | POA: Diagnosis not present

## 2018-12-29 DIAGNOSIS — F329 Major depressive disorder, single episode, unspecified: Secondary | ICD-10-CM | POA: Diagnosis not present

## 2018-12-29 DIAGNOSIS — I69354 Hemiplegia and hemiparesis following cerebral infarction affecting left non-dominant side: Secondary | ICD-10-CM | POA: Diagnosis not present

## 2018-12-29 DIAGNOSIS — I10 Essential (primary) hypertension: Secondary | ICD-10-CM | POA: Diagnosis not present

## 2018-12-29 DIAGNOSIS — D45 Polycythemia vera: Secondary | ICD-10-CM | POA: Diagnosis not present

## 2018-12-29 DIAGNOSIS — G40909 Epilepsy, unspecified, not intractable, without status epilepticus: Secondary | ICD-10-CM | POA: Diagnosis not present

## 2019-01-02 DIAGNOSIS — R64 Cachexia: Secondary | ICD-10-CM | POA: Diagnosis not present

## 2019-01-02 DIAGNOSIS — R4182 Altered mental status, unspecified: Secondary | ICD-10-CM | POA: Diagnosis not present

## 2019-01-02 DIAGNOSIS — F329 Major depressive disorder, single episode, unspecified: Secondary | ICD-10-CM | POA: Diagnosis not present

## 2019-01-02 DIAGNOSIS — J302 Other seasonal allergic rhinitis: Secondary | ICD-10-CM | POA: Diagnosis not present

## 2019-01-02 DIAGNOSIS — Z741 Need for assistance with personal care: Secondary | ICD-10-CM | POA: Diagnosis not present

## 2019-01-02 DIAGNOSIS — H53462 Homonymous bilateral field defects, left side: Secondary | ICD-10-CM | POA: Diagnosis not present

## 2019-01-02 DIAGNOSIS — E039 Hypothyroidism, unspecified: Secondary | ICD-10-CM | POA: Diagnosis not present

## 2019-01-02 DIAGNOSIS — M1990 Unspecified osteoarthritis, unspecified site: Secondary | ICD-10-CM | POA: Diagnosis not present

## 2019-01-02 DIAGNOSIS — M21372 Foot drop, left foot: Secondary | ICD-10-CM | POA: Diagnosis not present

## 2019-01-02 DIAGNOSIS — M21371 Foot drop, right foot: Secondary | ICD-10-CM | POA: Diagnosis not present

## 2019-01-02 DIAGNOSIS — K219 Gastro-esophageal reflux disease without esophagitis: Secondary | ICD-10-CM | POA: Diagnosis not present

## 2019-01-02 DIAGNOSIS — I10 Essential (primary) hypertension: Secondary | ICD-10-CM | POA: Diagnosis not present

## 2019-01-02 DIAGNOSIS — G40909 Epilepsy, unspecified, not intractable, without status epilepticus: Secondary | ICD-10-CM | POA: Diagnosis not present

## 2019-01-02 DIAGNOSIS — I69354 Hemiplegia and hemiparesis following cerebral infarction affecting left non-dominant side: Secondary | ICD-10-CM | POA: Diagnosis not present

## 2019-01-02 DIAGNOSIS — Z7401 Bed confinement status: Secondary | ICD-10-CM | POA: Diagnosis not present

## 2019-01-02 DIAGNOSIS — R443 Hallucinations, unspecified: Secondary | ICD-10-CM | POA: Diagnosis not present

## 2019-01-02 DIAGNOSIS — D45 Polycythemia vera: Secondary | ICD-10-CM | POA: Diagnosis not present

## 2019-01-03 DIAGNOSIS — I69354 Hemiplegia and hemiparesis following cerebral infarction affecting left non-dominant side: Secondary | ICD-10-CM | POA: Diagnosis not present

## 2019-01-03 DIAGNOSIS — G40909 Epilepsy, unspecified, not intractable, without status epilepticus: Secondary | ICD-10-CM | POA: Diagnosis not present

## 2019-01-03 DIAGNOSIS — R4182 Altered mental status, unspecified: Secondary | ICD-10-CM | POA: Diagnosis not present

## 2019-01-03 DIAGNOSIS — I10 Essential (primary) hypertension: Secondary | ICD-10-CM | POA: Diagnosis not present

## 2019-01-03 DIAGNOSIS — F329 Major depressive disorder, single episode, unspecified: Secondary | ICD-10-CM | POA: Diagnosis not present

## 2019-01-03 DIAGNOSIS — D45 Polycythemia vera: Secondary | ICD-10-CM | POA: Diagnosis not present

## 2019-01-12 DIAGNOSIS — F329 Major depressive disorder, single episode, unspecified: Secondary | ICD-10-CM | POA: Diagnosis not present

## 2019-01-12 DIAGNOSIS — R4182 Altered mental status, unspecified: Secondary | ICD-10-CM | POA: Diagnosis not present

## 2019-01-12 DIAGNOSIS — D45 Polycythemia vera: Secondary | ICD-10-CM | POA: Diagnosis not present

## 2019-01-12 DIAGNOSIS — I10 Essential (primary) hypertension: Secondary | ICD-10-CM | POA: Diagnosis not present

## 2019-01-12 DIAGNOSIS — G40909 Epilepsy, unspecified, not intractable, without status epilepticus: Secondary | ICD-10-CM | POA: Diagnosis not present

## 2019-01-12 DIAGNOSIS — I69354 Hemiplegia and hemiparesis following cerebral infarction affecting left non-dominant side: Secondary | ICD-10-CM | POA: Diagnosis not present

## 2019-01-18 DIAGNOSIS — I69354 Hemiplegia and hemiparesis following cerebral infarction affecting left non-dominant side: Secondary | ICD-10-CM | POA: Diagnosis not present

## 2019-01-18 DIAGNOSIS — I10 Essential (primary) hypertension: Secondary | ICD-10-CM | POA: Diagnosis not present

## 2019-01-18 DIAGNOSIS — G40909 Epilepsy, unspecified, not intractable, without status epilepticus: Secondary | ICD-10-CM | POA: Diagnosis not present

## 2019-01-18 DIAGNOSIS — R4182 Altered mental status, unspecified: Secondary | ICD-10-CM | POA: Diagnosis not present

## 2019-01-18 DIAGNOSIS — D45 Polycythemia vera: Secondary | ICD-10-CM | POA: Diagnosis not present

## 2019-01-18 DIAGNOSIS — F329 Major depressive disorder, single episode, unspecified: Secondary | ICD-10-CM | POA: Diagnosis not present

## 2019-01-25 DIAGNOSIS — I10 Essential (primary) hypertension: Secondary | ICD-10-CM | POA: Diagnosis not present

## 2019-01-25 DIAGNOSIS — R4182 Altered mental status, unspecified: Secondary | ICD-10-CM | POA: Diagnosis not present

## 2019-01-25 DIAGNOSIS — I69354 Hemiplegia and hemiparesis following cerebral infarction affecting left non-dominant side: Secondary | ICD-10-CM | POA: Diagnosis not present

## 2019-01-25 DIAGNOSIS — F329 Major depressive disorder, single episode, unspecified: Secondary | ICD-10-CM | POA: Diagnosis not present

## 2019-01-25 DIAGNOSIS — D45 Polycythemia vera: Secondary | ICD-10-CM | POA: Diagnosis not present

## 2019-01-25 DIAGNOSIS — G40909 Epilepsy, unspecified, not intractable, without status epilepticus: Secondary | ICD-10-CM | POA: Diagnosis not present

## 2019-01-25 IMAGING — MR MR HEAD WO/W CM
10 of 15 series · 27 of 48 positions shown · IV contrast (multihance)
Comparison: Comparison made with prior CT from 10/02/2016.

CLINICAL DATA: Initial evaluation for acute stroke.

EXAM:
MRI HEAD WITHOUT AND WITH CONTRAST
MRA HEAD WITHOUT CONTRAST
TECHNIQUE: Multiplanar, multiecho pulse sequences of the brain and surrounding
structures were obtained without and with intravenous contrast.
Angiographic images of the head were obtained using MRA technique
without contrast.
CONTRAST:  8mL MULTIHANCE GADOBENATE DIMEGLUMINE 529 MG/ML IV SOLN

[Series 3: DWI · axial · 3.0mm · 1.09mm/px · z∈[-108,+31]mm · 6 of 98 slices shown (1 of 4)]
[im 1/98]
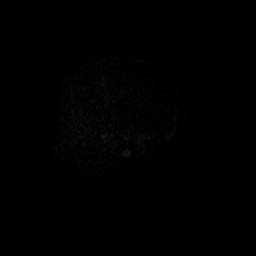
[im 20/98]
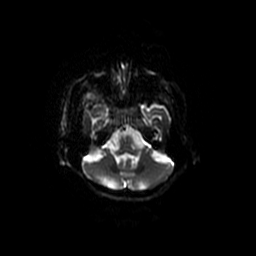
[im 39/98]
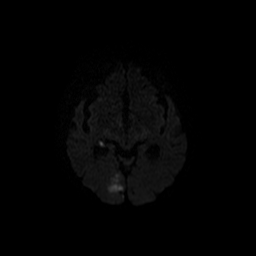
[im 59/98]
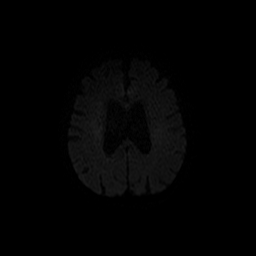
[im 78/98]
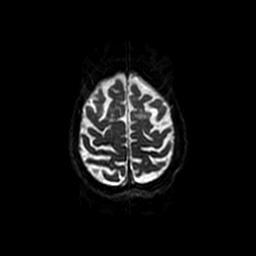
[im 98/98]
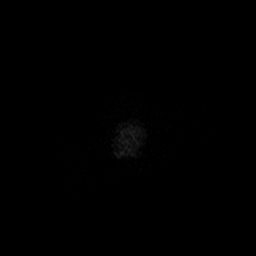

[Series 4: DWI · coronal · 5.0mm · 1.09mm/px · 4 of 72 slices shown (2 of 4)]
[im 1/72]
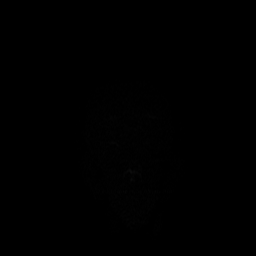
[im 24/72]
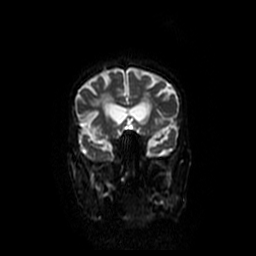
[im 48/72]
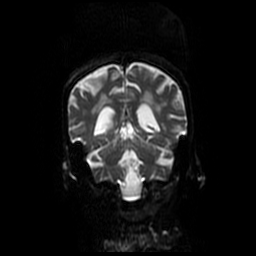
[im 72/72]
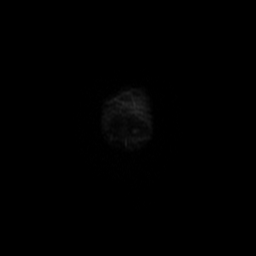

[Series 5: (id) mt fs · axial · 1.4mm · 0.43mm/px · z∈[-71,-58]mm · 2 of 152 slices shown]
[im 1/152]
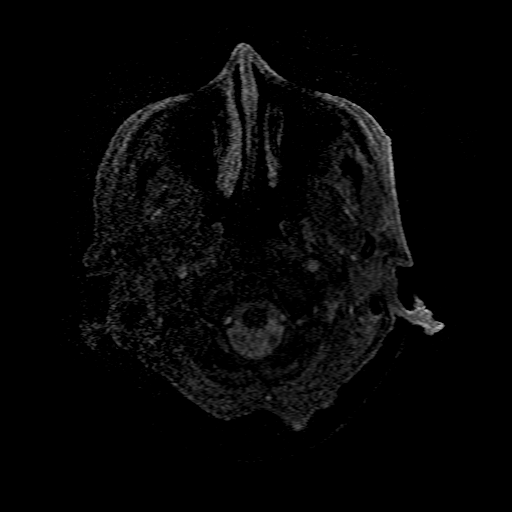
[im 19/152]
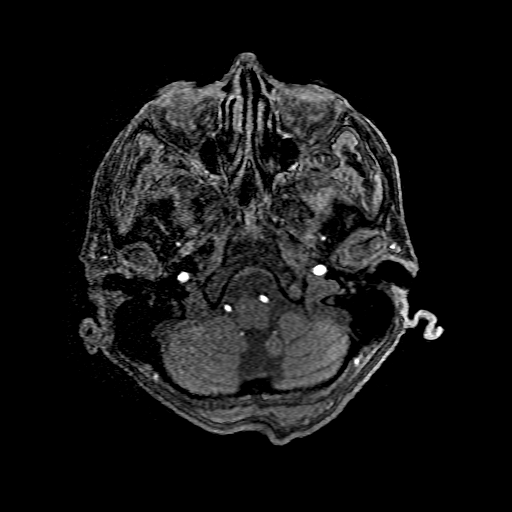

[Series 6: T1 · sagittal · 5.0mm · 0.47mm/px · 2 of 24 slices shown]
[im 1/24]
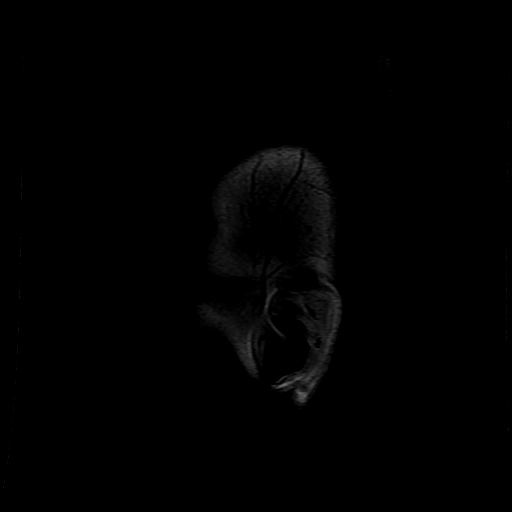
[im 24/24]
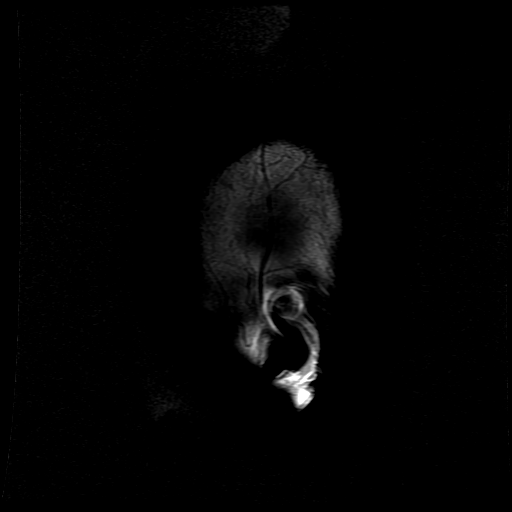

[Series 7: T2 · axial · 5.0mm · 0.43mm/px · z∈[-88,+53]mm · 2 of 25 slices shown]
[im 1/25]
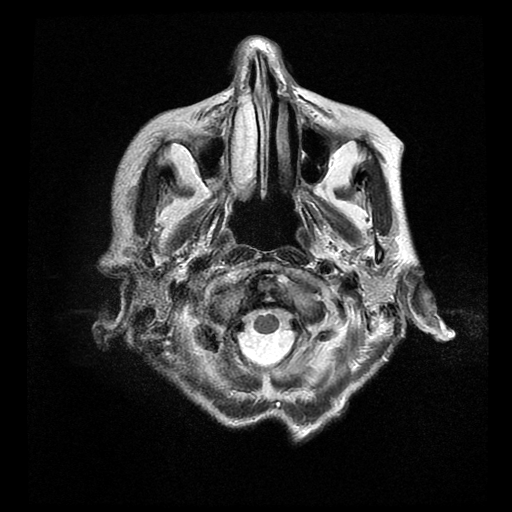
[im 25/25]
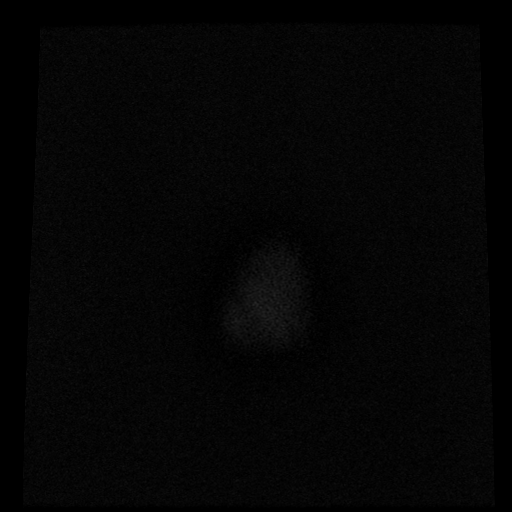

[Series 8: FLAIR · axial · 5.0mm · 0.43mm/px · z∈[-88,+53]mm · 2 of 25 slices shown]
[im 1/25]
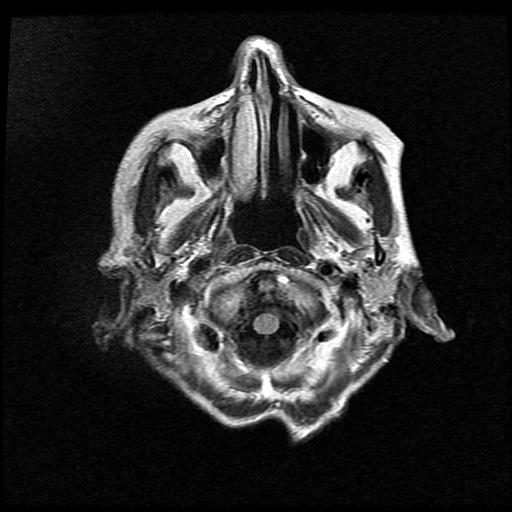
[im 25/25]
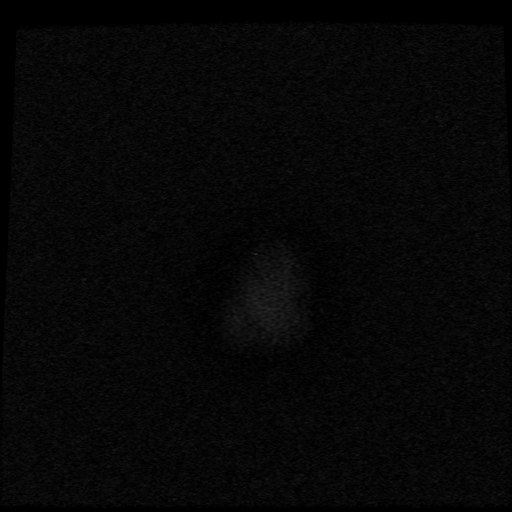

[Series 12: T2 post-contrast · coronal · 5.0mm · 0.39mm/px · 2 of 28 slices shown]
[im 1/28]
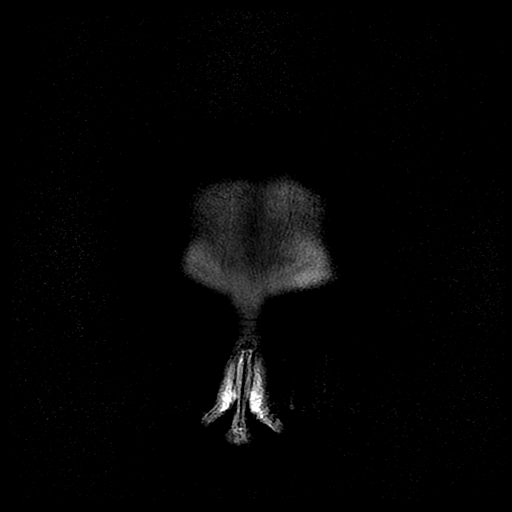
[im 28/28]
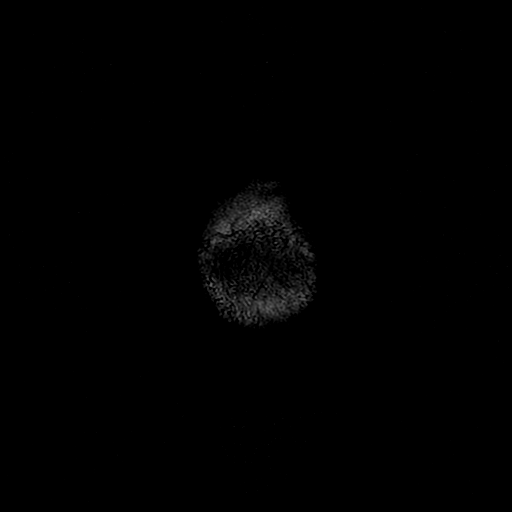

[Series 13: T1 post-contrast · coronal · 5.0mm · 0.39mm/px · 2 of 28 slices shown]
[im 1/28]
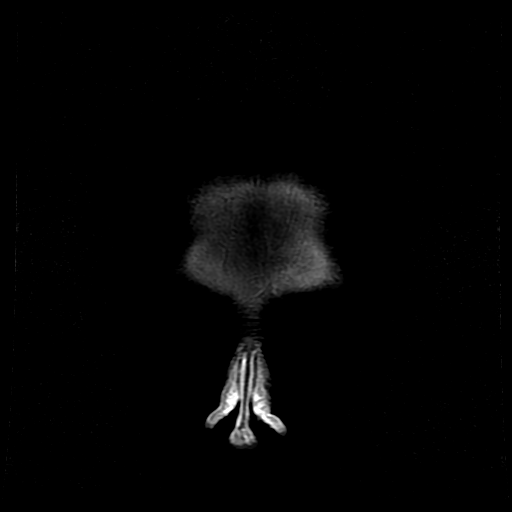
[im 28/28]
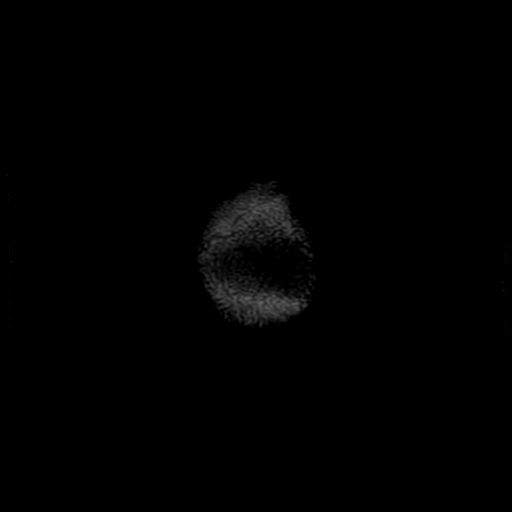

[Series 300: DWI · axial · 3.0mm · 1.09mm/px · z∈[-108,+31]mm · 3 of 49 slices shown (3 of 4)]
[im 1/49]
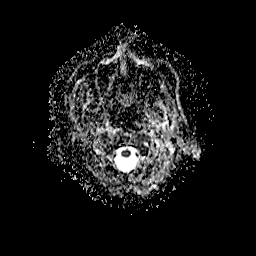
[im 25/49]
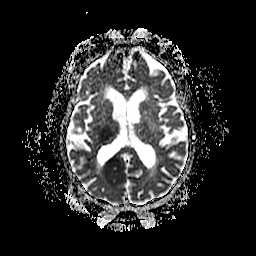
[im 49/49]
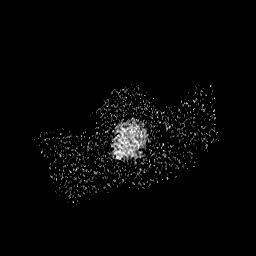

[Series 400: DWI · coronal · 5.0mm · 1.09mm/px · 2 of 36 slices shown (4 of 4)]
[im 1/36]
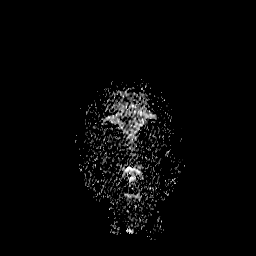
[im 36/36]
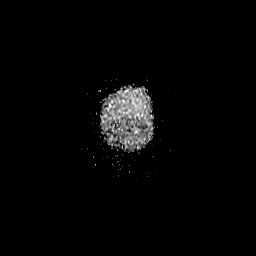

[27 of 48 positions shown; findings below may reference images not displayed]

FINDINGS: MRI HEAD FINDINGS

Brain: Diffuse prominence of the CSF containing spaces compatible
with generalized age-related cerebral atrophy. Patchy and confluent
T2/FLAIR hyperintensity within the periventricular and deep white
matter both cerebral hemispheres most consistent with chronic small
vessel ischemic disease, moderate in nature.

Confluent restricted diffusion involving the right thalamus/
posterior limb of the right internal capsule as well as the
parasagittal right occipital lobe, consistent with acute right PCA
territory infarct. Patchy involvement within the mesial right
temporal lobe at the right hippocampus.No significant edema or
associated hemorrhage.

Faint approximate 6 mm diffusion abnormality involving the right
cingulate cortex suspicious for possible tiny acute/subacute
ischemic right ACA territory infarct. This is best seen on coronal
DWI sequence (series 4, image 20). No associated hemorrhage or mass
effect.

No other acute or subacute infarct identified. No other areas of
chronic infarction. No evidence for acute or chronic intracranial
hemorrhage.

1.8 x 1.7 x 0.8 cm enhancing extra-axial lesion along the planum
sphenoid now light consistent with a small meningioma (series 13,
image 22). Additional round 14 mm meningioma present at the left
middle cranial fossa (series 11, image 9). No associated edema or
mass effect. No other mass lesion. No midline shift. Mild
ventricular prominence related global parenchymal volume loss of
hydrocephalus. No extra-axial fluid collection. Major dural sinuses
are grossly patent.

Pituitary gland suprasellar region within normal limits.  Lesion.

Vascular: Major intracranial vascular flow voids maintained.

Skull and upper cervical spine: Craniocervical junction within
normal limits. Visualized upper cervical spine within normal limits.
Bone marrow signal intensity within normal limits. No scalp soft
tissue abnormality.

Sinuses/Orbits: Globes and orbital soft tissues within normal
limits. Patient status post lens extraction bilaterally. Paranasal
sinuses and mastoids are clear. Inner ear structures grossly normal.

MRA HEAD FINDINGS

ANTERIOR CIRCULATION:

Distal cervical segments of the internal carotid arteries are patent
with antegrade flow. Petrous, cavernous, and supraclinoid segments
patent without flow-limiting stenosis. ICA termini patent male
laterally. A1 segments patent bilaterally, with the right being
dominant. Multifocal atheromatous irregularity involving the
anterior cerebral arteries bilaterally, stable from recent CTA. M1
segments patent without high-grade stenosis or occlusion. MCA
bifurcations normal. No proximal M2 occlusion. Distal MCA branches
are symmetric and well opacified bilaterally, although demonstrate
small vessel or irregularity.

POSTERIOR CIRCULATION:

Vertebral artery's patent to the vertebrobasilar junction without
flow-limiting stenosis. Posterior inferior cerebral arteries patent
proximally. Basilar artery patent to its distal aspect. Superior
cerebral arteries patent bilaterally. Abrupt right P1 occlusion,
stable from previous CTA. Multifocal atheromatous irregularity with
signal loss within the left P 2 segment. Left PCA is patent to its
distal aspect. Right PCA not visualized.

No aneurysm or vascular malformation.
IMPRESSION: MRI HEAD IMPRESSION:

1. Moderate to large evolving acute right PCA territory infarct. No
associated hemorrhage or mass effect.
2. Subtle subcentimeter cortical diffusion abnormality involving the
right cingulate, suspicious for acute/early subacute right ACA
territory infarct. No associated hemorrhage.
3. Extra-axial meningioma us along the planum sphenoid [place] and
left middle cranial fossa as above. No associated edema.
4. Moderate cerebral atrophy with chronic microvascular ischemic
disease.

MRA HEAD IMPRESSION:

1. Acute right P1 occlusion, stable relative to recent CTA.
2. Advanced multifocal atheromatous regularity throughout the left
PCA and its branches, also stable from recent CTA.
3. Distal bilateral ACA atherosclerosis and stenoses, also stable.

## 2019-01-30 DIAGNOSIS — D45 Polycythemia vera: Secondary | ICD-10-CM | POA: Diagnosis not present

## 2019-01-30 DIAGNOSIS — R4182 Altered mental status, unspecified: Secondary | ICD-10-CM | POA: Diagnosis not present

## 2019-01-30 DIAGNOSIS — I10 Essential (primary) hypertension: Secondary | ICD-10-CM | POA: Diagnosis not present

## 2019-01-30 DIAGNOSIS — G40909 Epilepsy, unspecified, not intractable, without status epilepticus: Secondary | ICD-10-CM | POA: Diagnosis not present

## 2019-01-30 DIAGNOSIS — F329 Major depressive disorder, single episode, unspecified: Secondary | ICD-10-CM | POA: Diagnosis not present

## 2019-01-30 DIAGNOSIS — I69354 Hemiplegia and hemiparesis following cerebral infarction affecting left non-dominant side: Secondary | ICD-10-CM | POA: Diagnosis not present

## 2019-02-02 DIAGNOSIS — D45 Polycythemia vera: Secondary | ICD-10-CM | POA: Diagnosis not present

## 2019-02-02 DIAGNOSIS — G40909 Epilepsy, unspecified, not intractable, without status epilepticus: Secondary | ICD-10-CM | POA: Diagnosis not present

## 2019-02-02 DIAGNOSIS — E039 Hypothyroidism, unspecified: Secondary | ICD-10-CM | POA: Diagnosis not present

## 2019-02-02 DIAGNOSIS — F329 Major depressive disorder, single episode, unspecified: Secondary | ICD-10-CM | POA: Diagnosis not present

## 2019-02-02 DIAGNOSIS — K219 Gastro-esophageal reflux disease without esophagitis: Secondary | ICD-10-CM | POA: Diagnosis not present

## 2019-02-02 DIAGNOSIS — R443 Hallucinations, unspecified: Secondary | ICD-10-CM | POA: Diagnosis not present

## 2019-02-02 DIAGNOSIS — J302 Other seasonal allergic rhinitis: Secondary | ICD-10-CM | POA: Diagnosis not present

## 2019-02-02 DIAGNOSIS — Z7401 Bed confinement status: Secondary | ICD-10-CM | POA: Diagnosis not present

## 2019-02-02 DIAGNOSIS — H53462 Homonymous bilateral field defects, left side: Secondary | ICD-10-CM | POA: Diagnosis not present

## 2019-02-02 DIAGNOSIS — I69354 Hemiplegia and hemiparesis following cerebral infarction affecting left non-dominant side: Secondary | ICD-10-CM | POA: Diagnosis not present

## 2019-02-02 DIAGNOSIS — R4182 Altered mental status, unspecified: Secondary | ICD-10-CM | POA: Diagnosis not present

## 2019-02-02 DIAGNOSIS — R64 Cachexia: Secondary | ICD-10-CM | POA: Diagnosis not present

## 2019-02-02 DIAGNOSIS — M1990 Unspecified osteoarthritis, unspecified site: Secondary | ICD-10-CM | POA: Diagnosis not present

## 2019-02-02 DIAGNOSIS — M21371 Foot drop, right foot: Secondary | ICD-10-CM | POA: Diagnosis not present

## 2019-02-02 DIAGNOSIS — M21372 Foot drop, left foot: Secondary | ICD-10-CM | POA: Diagnosis not present

## 2019-02-02 DIAGNOSIS — R05 Cough: Secondary | ICD-10-CM | POA: Diagnosis not present

## 2019-02-02 DIAGNOSIS — I10 Essential (primary) hypertension: Secondary | ICD-10-CM | POA: Diagnosis not present

## 2019-02-02 DIAGNOSIS — Z741 Need for assistance with personal care: Secondary | ICD-10-CM | POA: Diagnosis not present

## 2019-02-05 DIAGNOSIS — R4182 Altered mental status, unspecified: Secondary | ICD-10-CM | POA: Diagnosis not present

## 2019-02-05 DIAGNOSIS — H53462 Homonymous bilateral field defects, left side: Secondary | ICD-10-CM | POA: Diagnosis not present

## 2019-02-05 DIAGNOSIS — I10 Essential (primary) hypertension: Secondary | ICD-10-CM | POA: Diagnosis not present

## 2019-02-05 DIAGNOSIS — I69354 Hemiplegia and hemiparesis following cerebral infarction affecting left non-dominant side: Secondary | ICD-10-CM | POA: Diagnosis not present

## 2019-02-05 DIAGNOSIS — D45 Polycythemia vera: Secondary | ICD-10-CM | POA: Diagnosis not present

## 2019-02-05 DIAGNOSIS — G40909 Epilepsy, unspecified, not intractable, without status epilepticus: Secondary | ICD-10-CM | POA: Diagnosis not present

## 2019-02-07 DIAGNOSIS — I69354 Hemiplegia and hemiparesis following cerebral infarction affecting left non-dominant side: Secondary | ICD-10-CM | POA: Diagnosis not present

## 2019-02-07 DIAGNOSIS — G40909 Epilepsy, unspecified, not intractable, without status epilepticus: Secondary | ICD-10-CM | POA: Diagnosis not present

## 2019-02-07 DIAGNOSIS — R4182 Altered mental status, unspecified: Secondary | ICD-10-CM | POA: Diagnosis not present

## 2019-02-07 DIAGNOSIS — D45 Polycythemia vera: Secondary | ICD-10-CM | POA: Diagnosis not present

## 2019-02-07 DIAGNOSIS — I10 Essential (primary) hypertension: Secondary | ICD-10-CM | POA: Diagnosis not present

## 2019-02-07 DIAGNOSIS — H53462 Homonymous bilateral field defects, left side: Secondary | ICD-10-CM | POA: Diagnosis not present

## 2019-02-14 DIAGNOSIS — G40909 Epilepsy, unspecified, not intractable, without status epilepticus: Secondary | ICD-10-CM | POA: Diagnosis not present

## 2019-02-14 DIAGNOSIS — R4182 Altered mental status, unspecified: Secondary | ICD-10-CM | POA: Diagnosis not present

## 2019-02-14 DIAGNOSIS — H53462 Homonymous bilateral field defects, left side: Secondary | ICD-10-CM | POA: Diagnosis not present

## 2019-02-14 DIAGNOSIS — D45 Polycythemia vera: Secondary | ICD-10-CM | POA: Diagnosis not present

## 2019-02-14 DIAGNOSIS — I10 Essential (primary) hypertension: Secondary | ICD-10-CM | POA: Diagnosis not present

## 2019-02-14 DIAGNOSIS — I69354 Hemiplegia and hemiparesis following cerebral infarction affecting left non-dominant side: Secondary | ICD-10-CM | POA: Diagnosis not present

## 2019-02-19 DIAGNOSIS — R4182 Altered mental status, unspecified: Secondary | ICD-10-CM | POA: Diagnosis not present

## 2019-02-19 DIAGNOSIS — I69354 Hemiplegia and hemiparesis following cerebral infarction affecting left non-dominant side: Secondary | ICD-10-CM | POA: Diagnosis not present

## 2019-02-19 DIAGNOSIS — G40909 Epilepsy, unspecified, not intractable, without status epilepticus: Secondary | ICD-10-CM | POA: Diagnosis not present

## 2019-02-19 DIAGNOSIS — D45 Polycythemia vera: Secondary | ICD-10-CM | POA: Diagnosis not present

## 2019-02-19 DIAGNOSIS — H53462 Homonymous bilateral field defects, left side: Secondary | ICD-10-CM | POA: Diagnosis not present

## 2019-02-19 DIAGNOSIS — I10 Essential (primary) hypertension: Secondary | ICD-10-CM | POA: Diagnosis not present

## 2019-02-21 DIAGNOSIS — G40909 Epilepsy, unspecified, not intractable, without status epilepticus: Secondary | ICD-10-CM | POA: Diagnosis not present

## 2019-02-21 DIAGNOSIS — R4182 Altered mental status, unspecified: Secondary | ICD-10-CM | POA: Diagnosis not present

## 2019-02-21 DIAGNOSIS — I10 Essential (primary) hypertension: Secondary | ICD-10-CM | POA: Diagnosis not present

## 2019-02-21 DIAGNOSIS — I69354 Hemiplegia and hemiparesis following cerebral infarction affecting left non-dominant side: Secondary | ICD-10-CM | POA: Diagnosis not present

## 2019-02-21 DIAGNOSIS — H53462 Homonymous bilateral field defects, left side: Secondary | ICD-10-CM | POA: Diagnosis not present

## 2019-02-21 DIAGNOSIS — D45 Polycythemia vera: Secondary | ICD-10-CM | POA: Diagnosis not present

## 2019-02-28 DIAGNOSIS — R4182 Altered mental status, unspecified: Secondary | ICD-10-CM | POA: Diagnosis not present

## 2019-02-28 DIAGNOSIS — I69354 Hemiplegia and hemiparesis following cerebral infarction affecting left non-dominant side: Secondary | ICD-10-CM | POA: Diagnosis not present

## 2019-02-28 DIAGNOSIS — G40909 Epilepsy, unspecified, not intractable, without status epilepticus: Secondary | ICD-10-CM | POA: Diagnosis not present

## 2019-02-28 DIAGNOSIS — D45 Polycythemia vera: Secondary | ICD-10-CM | POA: Diagnosis not present

## 2019-02-28 DIAGNOSIS — I10 Essential (primary) hypertension: Secondary | ICD-10-CM | POA: Diagnosis not present

## 2019-02-28 DIAGNOSIS — H53462 Homonymous bilateral field defects, left side: Secondary | ICD-10-CM | POA: Diagnosis not present

## 2019-03-05 DIAGNOSIS — R443 Hallucinations, unspecified: Secondary | ICD-10-CM | POA: Diagnosis not present

## 2019-03-05 DIAGNOSIS — Z7401 Bed confinement status: Secondary | ICD-10-CM | POA: Diagnosis not present

## 2019-03-05 DIAGNOSIS — I69354 Hemiplegia and hemiparesis following cerebral infarction affecting left non-dominant side: Secondary | ICD-10-CM | POA: Diagnosis not present

## 2019-03-05 DIAGNOSIS — E039 Hypothyroidism, unspecified: Secondary | ICD-10-CM | POA: Diagnosis not present

## 2019-03-05 DIAGNOSIS — F329 Major depressive disorder, single episode, unspecified: Secondary | ICD-10-CM | POA: Diagnosis not present

## 2019-03-05 DIAGNOSIS — H53462 Homonymous bilateral field defects, left side: Secondary | ICD-10-CM | POA: Diagnosis not present

## 2019-03-05 DIAGNOSIS — M21371 Foot drop, right foot: Secondary | ICD-10-CM | POA: Diagnosis not present

## 2019-03-05 DIAGNOSIS — I10 Essential (primary) hypertension: Secondary | ICD-10-CM | POA: Diagnosis not present

## 2019-03-05 DIAGNOSIS — G40909 Epilepsy, unspecified, not intractable, without status epilepticus: Secondary | ICD-10-CM | POA: Diagnosis not present

## 2019-03-05 DIAGNOSIS — R4182 Altered mental status, unspecified: Secondary | ICD-10-CM | POA: Diagnosis not present

## 2019-03-05 DIAGNOSIS — R05 Cough: Secondary | ICD-10-CM | POA: Diagnosis not present

## 2019-03-05 DIAGNOSIS — K219 Gastro-esophageal reflux disease without esophagitis: Secondary | ICD-10-CM | POA: Diagnosis not present

## 2019-03-05 DIAGNOSIS — M21372 Foot drop, left foot: Secondary | ICD-10-CM | POA: Diagnosis not present

## 2019-03-05 DIAGNOSIS — D45 Polycythemia vera: Secondary | ICD-10-CM | POA: Diagnosis not present

## 2019-03-05 DIAGNOSIS — Z741 Need for assistance with personal care: Secondary | ICD-10-CM | POA: Diagnosis not present

## 2019-03-05 DIAGNOSIS — M1990 Unspecified osteoarthritis, unspecified site: Secondary | ICD-10-CM | POA: Diagnosis not present

## 2019-03-05 DIAGNOSIS — R64 Cachexia: Secondary | ICD-10-CM | POA: Diagnosis not present

## 2019-03-05 DIAGNOSIS — J302 Other seasonal allergic rhinitis: Secondary | ICD-10-CM | POA: Diagnosis not present

## 2019-03-07 DIAGNOSIS — I10 Essential (primary) hypertension: Secondary | ICD-10-CM | POA: Diagnosis not present

## 2019-03-07 DIAGNOSIS — G40909 Epilepsy, unspecified, not intractable, without status epilepticus: Secondary | ICD-10-CM | POA: Diagnosis not present

## 2019-03-07 DIAGNOSIS — I69354 Hemiplegia and hemiparesis following cerebral infarction affecting left non-dominant side: Secondary | ICD-10-CM | POA: Diagnosis not present

## 2019-03-07 DIAGNOSIS — D45 Polycythemia vera: Secondary | ICD-10-CM | POA: Diagnosis not present

## 2019-03-07 DIAGNOSIS — R4182 Altered mental status, unspecified: Secondary | ICD-10-CM | POA: Diagnosis not present

## 2019-03-07 DIAGNOSIS — H53462 Homonymous bilateral field defects, left side: Secondary | ICD-10-CM | POA: Diagnosis not present

## 2019-03-16 DIAGNOSIS — H53462 Homonymous bilateral field defects, left side: Secondary | ICD-10-CM | POA: Diagnosis not present

## 2019-03-16 DIAGNOSIS — I69354 Hemiplegia and hemiparesis following cerebral infarction affecting left non-dominant side: Secondary | ICD-10-CM | POA: Diagnosis not present

## 2019-03-16 DIAGNOSIS — I10 Essential (primary) hypertension: Secondary | ICD-10-CM | POA: Diagnosis not present

## 2019-03-16 DIAGNOSIS — G40909 Epilepsy, unspecified, not intractable, without status epilepticus: Secondary | ICD-10-CM | POA: Diagnosis not present

## 2019-03-16 DIAGNOSIS — D45 Polycythemia vera: Secondary | ICD-10-CM | POA: Diagnosis not present

## 2019-03-16 DIAGNOSIS — R4182 Altered mental status, unspecified: Secondary | ICD-10-CM | POA: Diagnosis not present

## 2019-03-21 DIAGNOSIS — H53462 Homonymous bilateral field defects, left side: Secondary | ICD-10-CM | POA: Diagnosis not present

## 2019-03-21 DIAGNOSIS — I10 Essential (primary) hypertension: Secondary | ICD-10-CM | POA: Diagnosis not present

## 2019-03-21 DIAGNOSIS — R4182 Altered mental status, unspecified: Secondary | ICD-10-CM | POA: Diagnosis not present

## 2019-03-21 DIAGNOSIS — I69354 Hemiplegia and hemiparesis following cerebral infarction affecting left non-dominant side: Secondary | ICD-10-CM | POA: Diagnosis not present

## 2019-03-21 DIAGNOSIS — D45 Polycythemia vera: Secondary | ICD-10-CM | POA: Diagnosis not present

## 2019-03-21 DIAGNOSIS — G40909 Epilepsy, unspecified, not intractable, without status epilepticus: Secondary | ICD-10-CM | POA: Diagnosis not present

## 2019-03-23 DIAGNOSIS — G40909 Epilepsy, unspecified, not intractable, without status epilepticus: Secondary | ICD-10-CM | POA: Diagnosis not present

## 2019-03-23 DIAGNOSIS — I10 Essential (primary) hypertension: Secondary | ICD-10-CM | POA: Diagnosis not present

## 2019-03-23 DIAGNOSIS — R4182 Altered mental status, unspecified: Secondary | ICD-10-CM | POA: Diagnosis not present

## 2019-03-23 DIAGNOSIS — I69354 Hemiplegia and hemiparesis following cerebral infarction affecting left non-dominant side: Secondary | ICD-10-CM | POA: Diagnosis not present

## 2019-03-23 DIAGNOSIS — D45 Polycythemia vera: Secondary | ICD-10-CM | POA: Diagnosis not present

## 2019-03-23 DIAGNOSIS — H53462 Homonymous bilateral field defects, left side: Secondary | ICD-10-CM | POA: Diagnosis not present

## 2019-03-29 DIAGNOSIS — R4182 Altered mental status, unspecified: Secondary | ICD-10-CM | POA: Diagnosis not present

## 2019-03-29 DIAGNOSIS — D45 Polycythemia vera: Secondary | ICD-10-CM | POA: Diagnosis not present

## 2019-03-29 DIAGNOSIS — I10 Essential (primary) hypertension: Secondary | ICD-10-CM | POA: Diagnosis not present

## 2019-03-29 DIAGNOSIS — H53462 Homonymous bilateral field defects, left side: Secondary | ICD-10-CM | POA: Diagnosis not present

## 2019-03-29 DIAGNOSIS — I69354 Hemiplegia and hemiparesis following cerebral infarction affecting left non-dominant side: Secondary | ICD-10-CM | POA: Diagnosis not present

## 2019-03-29 DIAGNOSIS — G40909 Epilepsy, unspecified, not intractable, without status epilepticus: Secondary | ICD-10-CM | POA: Diagnosis not present

## 2019-03-30 DIAGNOSIS — D45 Polycythemia vera: Secondary | ICD-10-CM | POA: Diagnosis not present

## 2019-03-30 DIAGNOSIS — I10 Essential (primary) hypertension: Secondary | ICD-10-CM | POA: Diagnosis not present

## 2019-03-30 DIAGNOSIS — G40909 Epilepsy, unspecified, not intractable, without status epilepticus: Secondary | ICD-10-CM | POA: Diagnosis not present

## 2019-03-30 DIAGNOSIS — R4182 Altered mental status, unspecified: Secondary | ICD-10-CM | POA: Diagnosis not present

## 2019-03-30 DIAGNOSIS — H53462 Homonymous bilateral field defects, left side: Secondary | ICD-10-CM | POA: Diagnosis not present

## 2019-03-30 DIAGNOSIS — I69354 Hemiplegia and hemiparesis following cerebral infarction affecting left non-dominant side: Secondary | ICD-10-CM | POA: Diagnosis not present

## 2019-04-02 DIAGNOSIS — E039 Hypothyroidism, unspecified: Secondary | ICD-10-CM | POA: Diagnosis not present

## 2019-04-02 DIAGNOSIS — G40909 Epilepsy, unspecified, not intractable, without status epilepticus: Secondary | ICD-10-CM | POA: Diagnosis not present

## 2019-04-02 DIAGNOSIS — J302 Other seasonal allergic rhinitis: Secondary | ICD-10-CM | POA: Diagnosis not present

## 2019-04-02 DIAGNOSIS — R64 Cachexia: Secondary | ICD-10-CM | POA: Diagnosis not present

## 2019-04-02 DIAGNOSIS — R233 Spontaneous ecchymoses: Secondary | ICD-10-CM | POA: Diagnosis not present

## 2019-04-02 DIAGNOSIS — M21372 Foot drop, left foot: Secondary | ICD-10-CM | POA: Diagnosis not present

## 2019-04-02 DIAGNOSIS — K219 Gastro-esophageal reflux disease without esophagitis: Secondary | ICD-10-CM | POA: Diagnosis not present

## 2019-04-02 DIAGNOSIS — Z7401 Bed confinement status: Secondary | ICD-10-CM | POA: Diagnosis not present

## 2019-04-02 DIAGNOSIS — H53462 Homonymous bilateral field defects, left side: Secondary | ICD-10-CM | POA: Diagnosis not present

## 2019-04-02 DIAGNOSIS — M21371 Foot drop, right foot: Secondary | ICD-10-CM | POA: Diagnosis not present

## 2019-04-02 DIAGNOSIS — D45 Polycythemia vera: Secondary | ICD-10-CM | POA: Diagnosis not present

## 2019-04-02 DIAGNOSIS — F329 Major depressive disorder, single episode, unspecified: Secondary | ICD-10-CM | POA: Diagnosis not present

## 2019-04-02 DIAGNOSIS — I69354 Hemiplegia and hemiparesis following cerebral infarction affecting left non-dominant side: Secondary | ICD-10-CM | POA: Diagnosis not present

## 2019-04-02 DIAGNOSIS — M1990 Unspecified osteoarthritis, unspecified site: Secondary | ICD-10-CM | POA: Diagnosis not present

## 2019-04-02 DIAGNOSIS — R4182 Altered mental status, unspecified: Secondary | ICD-10-CM | POA: Diagnosis not present

## 2019-04-02 DIAGNOSIS — R443 Hallucinations, unspecified: Secondary | ICD-10-CM | POA: Diagnosis not present

## 2019-04-02 DIAGNOSIS — I10 Essential (primary) hypertension: Secondary | ICD-10-CM | POA: Diagnosis not present

## 2019-04-02 DIAGNOSIS — Z741 Need for assistance with personal care: Secondary | ICD-10-CM | POA: Diagnosis not present

## 2019-04-02 DIAGNOSIS — R05 Cough: Secondary | ICD-10-CM | POA: Diagnosis not present

## 2019-04-05 DIAGNOSIS — D45 Polycythemia vera: Secondary | ICD-10-CM | POA: Diagnosis not present

## 2019-04-05 DIAGNOSIS — H53462 Homonymous bilateral field defects, left side: Secondary | ICD-10-CM | POA: Diagnosis not present

## 2019-04-05 DIAGNOSIS — I10 Essential (primary) hypertension: Secondary | ICD-10-CM | POA: Diagnosis not present

## 2019-04-05 DIAGNOSIS — I69354 Hemiplegia and hemiparesis following cerebral infarction affecting left non-dominant side: Secondary | ICD-10-CM | POA: Diagnosis not present

## 2019-04-05 DIAGNOSIS — G40909 Epilepsy, unspecified, not intractable, without status epilepticus: Secondary | ICD-10-CM | POA: Diagnosis not present

## 2019-04-05 DIAGNOSIS — R4182 Altered mental status, unspecified: Secondary | ICD-10-CM | POA: Diagnosis not present

## 2019-04-06 DIAGNOSIS — I69354 Hemiplegia and hemiparesis following cerebral infarction affecting left non-dominant side: Secondary | ICD-10-CM | POA: Diagnosis not present

## 2019-04-06 DIAGNOSIS — G40909 Epilepsy, unspecified, not intractable, without status epilepticus: Secondary | ICD-10-CM | POA: Diagnosis not present

## 2019-04-06 DIAGNOSIS — D45 Polycythemia vera: Secondary | ICD-10-CM | POA: Diagnosis not present

## 2019-04-06 DIAGNOSIS — H53462 Homonymous bilateral field defects, left side: Secondary | ICD-10-CM | POA: Diagnosis not present

## 2019-04-06 DIAGNOSIS — I10 Essential (primary) hypertension: Secondary | ICD-10-CM | POA: Diagnosis not present

## 2019-04-06 DIAGNOSIS — R4182 Altered mental status, unspecified: Secondary | ICD-10-CM | POA: Diagnosis not present

## 2019-04-11 DIAGNOSIS — I69354 Hemiplegia and hemiparesis following cerebral infarction affecting left non-dominant side: Secondary | ICD-10-CM | POA: Diagnosis not present

## 2019-04-11 DIAGNOSIS — H53462 Homonymous bilateral field defects, left side: Secondary | ICD-10-CM | POA: Diagnosis not present

## 2019-04-11 DIAGNOSIS — I10 Essential (primary) hypertension: Secondary | ICD-10-CM | POA: Diagnosis not present

## 2019-04-11 DIAGNOSIS — D45 Polycythemia vera: Secondary | ICD-10-CM | POA: Diagnosis not present

## 2019-04-11 DIAGNOSIS — G40909 Epilepsy, unspecified, not intractable, without status epilepticus: Secondary | ICD-10-CM | POA: Diagnosis not present

## 2019-04-11 DIAGNOSIS — R4182 Altered mental status, unspecified: Secondary | ICD-10-CM | POA: Diagnosis not present

## 2019-04-12 DIAGNOSIS — I69354 Hemiplegia and hemiparesis following cerebral infarction affecting left non-dominant side: Secondary | ICD-10-CM | POA: Diagnosis not present

## 2019-04-12 DIAGNOSIS — D45 Polycythemia vera: Secondary | ICD-10-CM | POA: Diagnosis not present

## 2019-04-12 DIAGNOSIS — R4182 Altered mental status, unspecified: Secondary | ICD-10-CM | POA: Diagnosis not present

## 2019-04-12 DIAGNOSIS — H53462 Homonymous bilateral field defects, left side: Secondary | ICD-10-CM | POA: Diagnosis not present

## 2019-04-12 DIAGNOSIS — I10 Essential (primary) hypertension: Secondary | ICD-10-CM | POA: Diagnosis not present

## 2019-04-12 DIAGNOSIS — G40909 Epilepsy, unspecified, not intractable, without status epilepticus: Secondary | ICD-10-CM | POA: Diagnosis not present

## 2019-04-19 DIAGNOSIS — R4182 Altered mental status, unspecified: Secondary | ICD-10-CM | POA: Diagnosis not present

## 2019-04-19 DIAGNOSIS — I10 Essential (primary) hypertension: Secondary | ICD-10-CM | POA: Diagnosis not present

## 2019-04-19 DIAGNOSIS — G40909 Epilepsy, unspecified, not intractable, without status epilepticus: Secondary | ICD-10-CM | POA: Diagnosis not present

## 2019-04-19 DIAGNOSIS — D45 Polycythemia vera: Secondary | ICD-10-CM | POA: Diagnosis not present

## 2019-04-19 DIAGNOSIS — H53462 Homonymous bilateral field defects, left side: Secondary | ICD-10-CM | POA: Diagnosis not present

## 2019-04-19 DIAGNOSIS — I69354 Hemiplegia and hemiparesis following cerebral infarction affecting left non-dominant side: Secondary | ICD-10-CM | POA: Diagnosis not present

## 2019-04-20 DIAGNOSIS — G40909 Epilepsy, unspecified, not intractable, without status epilepticus: Secondary | ICD-10-CM | POA: Diagnosis not present

## 2019-04-20 DIAGNOSIS — H53462 Homonymous bilateral field defects, left side: Secondary | ICD-10-CM | POA: Diagnosis not present

## 2019-04-20 DIAGNOSIS — I10 Essential (primary) hypertension: Secondary | ICD-10-CM | POA: Diagnosis not present

## 2019-04-20 DIAGNOSIS — I69354 Hemiplegia and hemiparesis following cerebral infarction affecting left non-dominant side: Secondary | ICD-10-CM | POA: Diagnosis not present

## 2019-04-20 DIAGNOSIS — D45 Polycythemia vera: Secondary | ICD-10-CM | POA: Diagnosis not present

## 2019-04-20 DIAGNOSIS — R4182 Altered mental status, unspecified: Secondary | ICD-10-CM | POA: Diagnosis not present

## 2019-04-25 DIAGNOSIS — I10 Essential (primary) hypertension: Secondary | ICD-10-CM | POA: Diagnosis not present

## 2019-04-25 DIAGNOSIS — I69354 Hemiplegia and hemiparesis following cerebral infarction affecting left non-dominant side: Secondary | ICD-10-CM | POA: Diagnosis not present

## 2019-04-25 DIAGNOSIS — H53462 Homonymous bilateral field defects, left side: Secondary | ICD-10-CM | POA: Diagnosis not present

## 2019-04-25 DIAGNOSIS — G40909 Epilepsy, unspecified, not intractable, without status epilepticus: Secondary | ICD-10-CM | POA: Diagnosis not present

## 2019-04-25 DIAGNOSIS — D45 Polycythemia vera: Secondary | ICD-10-CM | POA: Diagnosis not present

## 2019-04-25 DIAGNOSIS — R4182 Altered mental status, unspecified: Secondary | ICD-10-CM | POA: Diagnosis not present

## 2019-04-26 DIAGNOSIS — I10 Essential (primary) hypertension: Secondary | ICD-10-CM | POA: Diagnosis not present

## 2019-04-26 DIAGNOSIS — R4182 Altered mental status, unspecified: Secondary | ICD-10-CM | POA: Diagnosis not present

## 2019-04-26 DIAGNOSIS — D45 Polycythemia vera: Secondary | ICD-10-CM | POA: Diagnosis not present

## 2019-04-26 DIAGNOSIS — G40909 Epilepsy, unspecified, not intractable, without status epilepticus: Secondary | ICD-10-CM | POA: Diagnosis not present

## 2019-04-26 DIAGNOSIS — I69354 Hemiplegia and hemiparesis following cerebral infarction affecting left non-dominant side: Secondary | ICD-10-CM | POA: Diagnosis not present

## 2019-04-26 DIAGNOSIS — H53462 Homonymous bilateral field defects, left side: Secondary | ICD-10-CM | POA: Diagnosis not present

## 2019-05-03 DIAGNOSIS — K219 Gastro-esophageal reflux disease without esophagitis: Secondary | ICD-10-CM | POA: Diagnosis not present

## 2019-05-03 DIAGNOSIS — M1990 Unspecified osteoarthritis, unspecified site: Secondary | ICD-10-CM | POA: Diagnosis not present

## 2019-05-03 DIAGNOSIS — J302 Other seasonal allergic rhinitis: Secondary | ICD-10-CM | POA: Diagnosis not present

## 2019-05-03 DIAGNOSIS — H53462 Homonymous bilateral field defects, left side: Secondary | ICD-10-CM | POA: Diagnosis not present

## 2019-05-03 DIAGNOSIS — R64 Cachexia: Secondary | ICD-10-CM | POA: Diagnosis not present

## 2019-05-03 DIAGNOSIS — I69354 Hemiplegia and hemiparesis following cerebral infarction affecting left non-dominant side: Secondary | ICD-10-CM | POA: Diagnosis not present

## 2019-05-03 DIAGNOSIS — R443 Hallucinations, unspecified: Secondary | ICD-10-CM | POA: Diagnosis not present

## 2019-05-03 DIAGNOSIS — F329 Major depressive disorder, single episode, unspecified: Secondary | ICD-10-CM | POA: Diagnosis not present

## 2019-05-03 DIAGNOSIS — R05 Cough: Secondary | ICD-10-CM | POA: Diagnosis not present

## 2019-05-03 DIAGNOSIS — E039 Hypothyroidism, unspecified: Secondary | ICD-10-CM | POA: Diagnosis not present

## 2019-05-03 DIAGNOSIS — R233 Spontaneous ecchymoses: Secondary | ICD-10-CM | POA: Diagnosis not present

## 2019-05-03 DIAGNOSIS — M21371 Foot drop, right foot: Secondary | ICD-10-CM | POA: Diagnosis not present

## 2019-05-03 DIAGNOSIS — Z7401 Bed confinement status: Secondary | ICD-10-CM | POA: Diagnosis not present

## 2019-05-03 DIAGNOSIS — D45 Polycythemia vera: Secondary | ICD-10-CM | POA: Diagnosis not present

## 2019-05-03 DIAGNOSIS — I10 Essential (primary) hypertension: Secondary | ICD-10-CM | POA: Diagnosis not present

## 2019-05-03 DIAGNOSIS — Z741 Need for assistance with personal care: Secondary | ICD-10-CM | POA: Diagnosis not present

## 2019-05-03 DIAGNOSIS — R4182 Altered mental status, unspecified: Secondary | ICD-10-CM | POA: Diagnosis not present

## 2019-05-03 DIAGNOSIS — G40909 Epilepsy, unspecified, not intractable, without status epilepticus: Secondary | ICD-10-CM | POA: Diagnosis not present

## 2019-05-03 DIAGNOSIS — M21372 Foot drop, left foot: Secondary | ICD-10-CM | POA: Diagnosis not present

## 2019-05-08 DIAGNOSIS — R4182 Altered mental status, unspecified: Secondary | ICD-10-CM | POA: Diagnosis not present

## 2019-05-08 DIAGNOSIS — I10 Essential (primary) hypertension: Secondary | ICD-10-CM | POA: Diagnosis not present

## 2019-05-08 DIAGNOSIS — D45 Polycythemia vera: Secondary | ICD-10-CM | POA: Diagnosis not present

## 2019-05-08 DIAGNOSIS — I69354 Hemiplegia and hemiparesis following cerebral infarction affecting left non-dominant side: Secondary | ICD-10-CM | POA: Diagnosis not present

## 2019-05-08 DIAGNOSIS — H53462 Homonymous bilateral field defects, left side: Secondary | ICD-10-CM | POA: Diagnosis not present

## 2019-05-08 DIAGNOSIS — G40909 Epilepsy, unspecified, not intractable, without status epilepticus: Secondary | ICD-10-CM | POA: Diagnosis not present

## 2019-05-09 DIAGNOSIS — R4182 Altered mental status, unspecified: Secondary | ICD-10-CM | POA: Diagnosis not present

## 2019-05-09 DIAGNOSIS — D45 Polycythemia vera: Secondary | ICD-10-CM | POA: Diagnosis not present

## 2019-05-09 DIAGNOSIS — I69354 Hemiplegia and hemiparesis following cerebral infarction affecting left non-dominant side: Secondary | ICD-10-CM | POA: Diagnosis not present

## 2019-05-09 DIAGNOSIS — H53462 Homonymous bilateral field defects, left side: Secondary | ICD-10-CM | POA: Diagnosis not present

## 2019-05-09 DIAGNOSIS — G40909 Epilepsy, unspecified, not intractable, without status epilepticus: Secondary | ICD-10-CM | POA: Diagnosis not present

## 2019-05-09 DIAGNOSIS — I10 Essential (primary) hypertension: Secondary | ICD-10-CM | POA: Diagnosis not present

## 2019-05-10 DIAGNOSIS — H53462 Homonymous bilateral field defects, left side: Secondary | ICD-10-CM | POA: Diagnosis not present

## 2019-05-10 DIAGNOSIS — I69354 Hemiplegia and hemiparesis following cerebral infarction affecting left non-dominant side: Secondary | ICD-10-CM | POA: Diagnosis not present

## 2019-05-10 DIAGNOSIS — I10 Essential (primary) hypertension: Secondary | ICD-10-CM | POA: Diagnosis not present

## 2019-05-10 DIAGNOSIS — D45 Polycythemia vera: Secondary | ICD-10-CM | POA: Diagnosis not present

## 2019-05-10 DIAGNOSIS — G40909 Epilepsy, unspecified, not intractable, without status epilepticus: Secondary | ICD-10-CM | POA: Diagnosis not present

## 2019-05-10 DIAGNOSIS — R4182 Altered mental status, unspecified: Secondary | ICD-10-CM | POA: Diagnosis not present

## 2019-05-17 DIAGNOSIS — I10 Essential (primary) hypertension: Secondary | ICD-10-CM | POA: Diagnosis not present

## 2019-05-17 DIAGNOSIS — I69354 Hemiplegia and hemiparesis following cerebral infarction affecting left non-dominant side: Secondary | ICD-10-CM | POA: Diagnosis not present

## 2019-05-17 DIAGNOSIS — H53462 Homonymous bilateral field defects, left side: Secondary | ICD-10-CM | POA: Diagnosis not present

## 2019-05-17 DIAGNOSIS — G40909 Epilepsy, unspecified, not intractable, without status epilepticus: Secondary | ICD-10-CM | POA: Diagnosis not present

## 2019-05-17 DIAGNOSIS — R4182 Altered mental status, unspecified: Secondary | ICD-10-CM | POA: Diagnosis not present

## 2019-05-17 DIAGNOSIS — D45 Polycythemia vera: Secondary | ICD-10-CM | POA: Diagnosis not present

## 2019-05-18 DIAGNOSIS — H53462 Homonymous bilateral field defects, left side: Secondary | ICD-10-CM | POA: Diagnosis not present

## 2019-05-18 DIAGNOSIS — I69354 Hemiplegia and hemiparesis following cerebral infarction affecting left non-dominant side: Secondary | ICD-10-CM | POA: Diagnosis not present

## 2019-05-18 DIAGNOSIS — G40909 Epilepsy, unspecified, not intractable, without status epilepticus: Secondary | ICD-10-CM | POA: Diagnosis not present

## 2019-05-18 DIAGNOSIS — D45 Polycythemia vera: Secondary | ICD-10-CM | POA: Diagnosis not present

## 2019-05-18 DIAGNOSIS — R4182 Altered mental status, unspecified: Secondary | ICD-10-CM | POA: Diagnosis not present

## 2019-05-18 DIAGNOSIS — I10 Essential (primary) hypertension: Secondary | ICD-10-CM | POA: Diagnosis not present

## 2019-05-24 DIAGNOSIS — G40909 Epilepsy, unspecified, not intractable, without status epilepticus: Secondary | ICD-10-CM | POA: Diagnosis not present

## 2019-05-24 DIAGNOSIS — I10 Essential (primary) hypertension: Secondary | ICD-10-CM | POA: Diagnosis not present

## 2019-05-24 DIAGNOSIS — I69354 Hemiplegia and hemiparesis following cerebral infarction affecting left non-dominant side: Secondary | ICD-10-CM | POA: Diagnosis not present

## 2019-05-24 DIAGNOSIS — H53462 Homonymous bilateral field defects, left side: Secondary | ICD-10-CM | POA: Diagnosis not present

## 2019-05-24 DIAGNOSIS — R4182 Altered mental status, unspecified: Secondary | ICD-10-CM | POA: Diagnosis not present

## 2019-05-24 DIAGNOSIS — D45 Polycythemia vera: Secondary | ICD-10-CM | POA: Diagnosis not present

## 2019-05-25 DIAGNOSIS — R4182 Altered mental status, unspecified: Secondary | ICD-10-CM | POA: Diagnosis not present

## 2019-05-25 DIAGNOSIS — I69354 Hemiplegia and hemiparesis following cerebral infarction affecting left non-dominant side: Secondary | ICD-10-CM | POA: Diagnosis not present

## 2019-05-25 DIAGNOSIS — D45 Polycythemia vera: Secondary | ICD-10-CM | POA: Diagnosis not present

## 2019-05-25 DIAGNOSIS — I10 Essential (primary) hypertension: Secondary | ICD-10-CM | POA: Diagnosis not present

## 2019-05-25 DIAGNOSIS — H53462 Homonymous bilateral field defects, left side: Secondary | ICD-10-CM | POA: Diagnosis not present

## 2019-05-25 DIAGNOSIS — G40909 Epilepsy, unspecified, not intractable, without status epilepticus: Secondary | ICD-10-CM | POA: Diagnosis not present

## 2019-05-31 DIAGNOSIS — I10 Essential (primary) hypertension: Secondary | ICD-10-CM | POA: Diagnosis not present

## 2019-05-31 DIAGNOSIS — R4182 Altered mental status, unspecified: Secondary | ICD-10-CM | POA: Diagnosis not present

## 2019-05-31 DIAGNOSIS — I69354 Hemiplegia and hemiparesis following cerebral infarction affecting left non-dominant side: Secondary | ICD-10-CM | POA: Diagnosis not present

## 2019-05-31 DIAGNOSIS — H53462 Homonymous bilateral field defects, left side: Secondary | ICD-10-CM | POA: Diagnosis not present

## 2019-05-31 DIAGNOSIS — D45 Polycythemia vera: Secondary | ICD-10-CM | POA: Diagnosis not present

## 2019-05-31 DIAGNOSIS — G40909 Epilepsy, unspecified, not intractable, without status epilepticus: Secondary | ICD-10-CM | POA: Diagnosis not present

## 2019-06-01 DIAGNOSIS — R4182 Altered mental status, unspecified: Secondary | ICD-10-CM | POA: Diagnosis not present

## 2019-06-01 DIAGNOSIS — D45 Polycythemia vera: Secondary | ICD-10-CM | POA: Diagnosis not present

## 2019-06-01 DIAGNOSIS — G40909 Epilepsy, unspecified, not intractable, without status epilepticus: Secondary | ICD-10-CM | POA: Diagnosis not present

## 2019-06-01 DIAGNOSIS — I69354 Hemiplegia and hemiparesis following cerebral infarction affecting left non-dominant side: Secondary | ICD-10-CM | POA: Diagnosis not present

## 2019-06-01 DIAGNOSIS — H53462 Homonymous bilateral field defects, left side: Secondary | ICD-10-CM | POA: Diagnosis not present

## 2019-06-01 DIAGNOSIS — I10 Essential (primary) hypertension: Secondary | ICD-10-CM | POA: Diagnosis not present

## 2019-06-02 DIAGNOSIS — I69354 Hemiplegia and hemiparesis following cerebral infarction affecting left non-dominant side: Secondary | ICD-10-CM | POA: Diagnosis not present

## 2019-06-02 DIAGNOSIS — R4182 Altered mental status, unspecified: Secondary | ICD-10-CM | POA: Diagnosis not present

## 2019-06-02 DIAGNOSIS — D45 Polycythemia vera: Secondary | ICD-10-CM | POA: Diagnosis not present

## 2019-06-02 DIAGNOSIS — R233 Spontaneous ecchymoses: Secondary | ICD-10-CM | POA: Diagnosis not present

## 2019-06-02 DIAGNOSIS — M1990 Unspecified osteoarthritis, unspecified site: Secondary | ICD-10-CM | POA: Diagnosis not present

## 2019-06-02 DIAGNOSIS — Z7401 Bed confinement status: Secondary | ICD-10-CM | POA: Diagnosis not present

## 2019-06-02 DIAGNOSIS — R64 Cachexia: Secondary | ICD-10-CM | POA: Diagnosis not present

## 2019-06-02 DIAGNOSIS — G40909 Epilepsy, unspecified, not intractable, without status epilepticus: Secondary | ICD-10-CM | POA: Diagnosis not present

## 2019-06-02 DIAGNOSIS — M21372 Foot drop, left foot: Secondary | ICD-10-CM | POA: Diagnosis not present

## 2019-06-02 DIAGNOSIS — Z741 Need for assistance with personal care: Secondary | ICD-10-CM | POA: Diagnosis not present

## 2019-06-02 DIAGNOSIS — F329 Major depressive disorder, single episode, unspecified: Secondary | ICD-10-CM | POA: Diagnosis not present

## 2019-06-02 DIAGNOSIS — R443 Hallucinations, unspecified: Secondary | ICD-10-CM | POA: Diagnosis not present

## 2019-06-02 DIAGNOSIS — K219 Gastro-esophageal reflux disease without esophagitis: Secondary | ICD-10-CM | POA: Diagnosis not present

## 2019-06-02 DIAGNOSIS — J302 Other seasonal allergic rhinitis: Secondary | ICD-10-CM | POA: Diagnosis not present

## 2019-06-02 DIAGNOSIS — E039 Hypothyroidism, unspecified: Secondary | ICD-10-CM | POA: Diagnosis not present

## 2019-06-02 DIAGNOSIS — L89610 Pressure ulcer of right heel, unstageable: Secondary | ICD-10-CM | POA: Diagnosis not present

## 2019-06-02 DIAGNOSIS — R05 Cough: Secondary | ICD-10-CM | POA: Diagnosis not present

## 2019-06-02 DIAGNOSIS — H53462 Homonymous bilateral field defects, left side: Secondary | ICD-10-CM | POA: Diagnosis not present

## 2019-06-02 DIAGNOSIS — I10 Essential (primary) hypertension: Secondary | ICD-10-CM | POA: Diagnosis not present

## 2019-06-02 DIAGNOSIS — L89892 Pressure ulcer of other site, stage 2: Secondary | ICD-10-CM | POA: Diagnosis not present

## 2019-06-02 DIAGNOSIS — M21371 Foot drop, right foot: Secondary | ICD-10-CM | POA: Diagnosis not present

## 2019-06-05 DIAGNOSIS — I10 Essential (primary) hypertension: Secondary | ICD-10-CM | POA: Diagnosis not present

## 2019-06-05 DIAGNOSIS — R4182 Altered mental status, unspecified: Secondary | ICD-10-CM | POA: Diagnosis not present

## 2019-06-05 DIAGNOSIS — I69354 Hemiplegia and hemiparesis following cerebral infarction affecting left non-dominant side: Secondary | ICD-10-CM | POA: Diagnosis not present

## 2019-06-05 DIAGNOSIS — G40909 Epilepsy, unspecified, not intractable, without status epilepticus: Secondary | ICD-10-CM | POA: Diagnosis not present

## 2019-06-05 DIAGNOSIS — H53462 Homonymous bilateral field defects, left side: Secondary | ICD-10-CM | POA: Diagnosis not present

## 2019-06-05 DIAGNOSIS — D45 Polycythemia vera: Secondary | ICD-10-CM | POA: Diagnosis not present

## 2019-06-06 DIAGNOSIS — D45 Polycythemia vera: Secondary | ICD-10-CM | POA: Diagnosis not present

## 2019-06-06 DIAGNOSIS — G40909 Epilepsy, unspecified, not intractable, without status epilepticus: Secondary | ICD-10-CM | POA: Diagnosis not present

## 2019-06-06 DIAGNOSIS — R4182 Altered mental status, unspecified: Secondary | ICD-10-CM | POA: Diagnosis not present

## 2019-06-06 DIAGNOSIS — I69354 Hemiplegia and hemiparesis following cerebral infarction affecting left non-dominant side: Secondary | ICD-10-CM | POA: Diagnosis not present

## 2019-06-06 DIAGNOSIS — H53462 Homonymous bilateral field defects, left side: Secondary | ICD-10-CM | POA: Diagnosis not present

## 2019-06-06 DIAGNOSIS — I10 Essential (primary) hypertension: Secondary | ICD-10-CM | POA: Diagnosis not present

## 2019-06-07 DIAGNOSIS — I10 Essential (primary) hypertension: Secondary | ICD-10-CM | POA: Diagnosis not present

## 2019-06-07 DIAGNOSIS — I69354 Hemiplegia and hemiparesis following cerebral infarction affecting left non-dominant side: Secondary | ICD-10-CM | POA: Diagnosis not present

## 2019-06-07 DIAGNOSIS — R4182 Altered mental status, unspecified: Secondary | ICD-10-CM | POA: Diagnosis not present

## 2019-06-07 DIAGNOSIS — G40909 Epilepsy, unspecified, not intractable, without status epilepticus: Secondary | ICD-10-CM | POA: Diagnosis not present

## 2019-06-07 DIAGNOSIS — D45 Polycythemia vera: Secondary | ICD-10-CM | POA: Diagnosis not present

## 2019-06-07 DIAGNOSIS — H53462 Homonymous bilateral field defects, left side: Secondary | ICD-10-CM | POA: Diagnosis not present

## 2019-06-14 DIAGNOSIS — R4182 Altered mental status, unspecified: Secondary | ICD-10-CM | POA: Diagnosis not present

## 2019-06-14 DIAGNOSIS — H53462 Homonymous bilateral field defects, left side: Secondary | ICD-10-CM | POA: Diagnosis not present

## 2019-06-14 DIAGNOSIS — I69354 Hemiplegia and hemiparesis following cerebral infarction affecting left non-dominant side: Secondary | ICD-10-CM | POA: Diagnosis not present

## 2019-06-14 DIAGNOSIS — G40909 Epilepsy, unspecified, not intractable, without status epilepticus: Secondary | ICD-10-CM | POA: Diagnosis not present

## 2019-06-14 DIAGNOSIS — I10 Essential (primary) hypertension: Secondary | ICD-10-CM | POA: Diagnosis not present

## 2019-06-14 DIAGNOSIS — D45 Polycythemia vera: Secondary | ICD-10-CM | POA: Diagnosis not present

## 2019-06-15 DIAGNOSIS — R4182 Altered mental status, unspecified: Secondary | ICD-10-CM | POA: Diagnosis not present

## 2019-06-15 DIAGNOSIS — I69354 Hemiplegia and hemiparesis following cerebral infarction affecting left non-dominant side: Secondary | ICD-10-CM | POA: Diagnosis not present

## 2019-06-15 DIAGNOSIS — D45 Polycythemia vera: Secondary | ICD-10-CM | POA: Diagnosis not present

## 2019-06-15 DIAGNOSIS — I10 Essential (primary) hypertension: Secondary | ICD-10-CM | POA: Diagnosis not present

## 2019-06-15 DIAGNOSIS — H53462 Homonymous bilateral field defects, left side: Secondary | ICD-10-CM | POA: Diagnosis not present

## 2019-06-15 DIAGNOSIS — G40909 Epilepsy, unspecified, not intractable, without status epilepticus: Secondary | ICD-10-CM | POA: Diagnosis not present

## 2019-06-21 DIAGNOSIS — D45 Polycythemia vera: Secondary | ICD-10-CM | POA: Diagnosis not present

## 2019-06-21 DIAGNOSIS — I69354 Hemiplegia and hemiparesis following cerebral infarction affecting left non-dominant side: Secondary | ICD-10-CM | POA: Diagnosis not present

## 2019-06-21 DIAGNOSIS — R4182 Altered mental status, unspecified: Secondary | ICD-10-CM | POA: Diagnosis not present

## 2019-06-21 DIAGNOSIS — H53462 Homonymous bilateral field defects, left side: Secondary | ICD-10-CM | POA: Diagnosis not present

## 2019-06-21 DIAGNOSIS — G40909 Epilepsy, unspecified, not intractable, without status epilepticus: Secondary | ICD-10-CM | POA: Diagnosis not present

## 2019-06-21 DIAGNOSIS — I10 Essential (primary) hypertension: Secondary | ICD-10-CM | POA: Diagnosis not present

## 2019-06-22 DIAGNOSIS — I10 Essential (primary) hypertension: Secondary | ICD-10-CM | POA: Diagnosis not present

## 2019-06-22 DIAGNOSIS — R4182 Altered mental status, unspecified: Secondary | ICD-10-CM | POA: Diagnosis not present

## 2019-06-22 DIAGNOSIS — H53462 Homonymous bilateral field defects, left side: Secondary | ICD-10-CM | POA: Diagnosis not present

## 2019-06-22 DIAGNOSIS — D45 Polycythemia vera: Secondary | ICD-10-CM | POA: Diagnosis not present

## 2019-06-22 DIAGNOSIS — I69354 Hemiplegia and hemiparesis following cerebral infarction affecting left non-dominant side: Secondary | ICD-10-CM | POA: Diagnosis not present

## 2019-06-22 DIAGNOSIS — G40909 Epilepsy, unspecified, not intractable, without status epilepticus: Secondary | ICD-10-CM | POA: Diagnosis not present

## 2019-06-26 DIAGNOSIS — H53462 Homonymous bilateral field defects, left side: Secondary | ICD-10-CM | POA: Diagnosis not present

## 2019-06-26 DIAGNOSIS — I10 Essential (primary) hypertension: Secondary | ICD-10-CM | POA: Diagnosis not present

## 2019-06-26 DIAGNOSIS — G40909 Epilepsy, unspecified, not intractable, without status epilepticus: Secondary | ICD-10-CM | POA: Diagnosis not present

## 2019-06-26 DIAGNOSIS — D45 Polycythemia vera: Secondary | ICD-10-CM | POA: Diagnosis not present

## 2019-06-26 DIAGNOSIS — R4182 Altered mental status, unspecified: Secondary | ICD-10-CM | POA: Diagnosis not present

## 2019-06-26 DIAGNOSIS — I69354 Hemiplegia and hemiparesis following cerebral infarction affecting left non-dominant side: Secondary | ICD-10-CM | POA: Diagnosis not present

## 2019-06-29 DIAGNOSIS — I69354 Hemiplegia and hemiparesis following cerebral infarction affecting left non-dominant side: Secondary | ICD-10-CM | POA: Diagnosis not present

## 2019-06-29 DIAGNOSIS — D45 Polycythemia vera: Secondary | ICD-10-CM | POA: Diagnosis not present

## 2019-06-29 DIAGNOSIS — G40909 Epilepsy, unspecified, not intractable, without status epilepticus: Secondary | ICD-10-CM | POA: Diagnosis not present

## 2019-06-29 DIAGNOSIS — R4182 Altered mental status, unspecified: Secondary | ICD-10-CM | POA: Diagnosis not present

## 2019-06-29 DIAGNOSIS — I10 Essential (primary) hypertension: Secondary | ICD-10-CM | POA: Diagnosis not present

## 2019-06-29 DIAGNOSIS — H53462 Homonymous bilateral field defects, left side: Secondary | ICD-10-CM | POA: Diagnosis not present

## 2019-07-02 DIAGNOSIS — R4182 Altered mental status, unspecified: Secondary | ICD-10-CM | POA: Diagnosis not present

## 2019-07-02 DIAGNOSIS — H53462 Homonymous bilateral field defects, left side: Secondary | ICD-10-CM | POA: Diagnosis not present

## 2019-07-02 DIAGNOSIS — I10 Essential (primary) hypertension: Secondary | ICD-10-CM | POA: Diagnosis not present

## 2019-07-02 DIAGNOSIS — I69354 Hemiplegia and hemiparesis following cerebral infarction affecting left non-dominant side: Secondary | ICD-10-CM | POA: Diagnosis not present

## 2019-07-02 DIAGNOSIS — D45 Polycythemia vera: Secondary | ICD-10-CM | POA: Diagnosis not present

## 2019-07-02 DIAGNOSIS — G40909 Epilepsy, unspecified, not intractable, without status epilepticus: Secondary | ICD-10-CM | POA: Diagnosis not present

## 2019-07-03 DIAGNOSIS — K219 Gastro-esophageal reflux disease without esophagitis: Secondary | ICD-10-CM | POA: Diagnosis not present

## 2019-07-03 DIAGNOSIS — L89610 Pressure ulcer of right heel, unstageable: Secondary | ICD-10-CM | POA: Diagnosis not present

## 2019-07-03 DIAGNOSIS — D45 Polycythemia vera: Secondary | ICD-10-CM | POA: Diagnosis not present

## 2019-07-03 DIAGNOSIS — R443 Hallucinations, unspecified: Secondary | ICD-10-CM | POA: Diagnosis not present

## 2019-07-03 DIAGNOSIS — F329 Major depressive disorder, single episode, unspecified: Secondary | ICD-10-CM | POA: Diagnosis not present

## 2019-07-03 DIAGNOSIS — M21372 Foot drop, left foot: Secondary | ICD-10-CM | POA: Diagnosis not present

## 2019-07-03 DIAGNOSIS — Z741 Need for assistance with personal care: Secondary | ICD-10-CM | POA: Diagnosis not present

## 2019-07-03 DIAGNOSIS — H53462 Homonymous bilateral field defects, left side: Secondary | ICD-10-CM | POA: Diagnosis not present

## 2019-07-03 DIAGNOSIS — L89892 Pressure ulcer of other site, stage 2: Secondary | ICD-10-CM | POA: Diagnosis not present

## 2019-07-03 DIAGNOSIS — M1990 Unspecified osteoarthritis, unspecified site: Secondary | ICD-10-CM | POA: Diagnosis not present

## 2019-07-03 DIAGNOSIS — R233 Spontaneous ecchymoses: Secondary | ICD-10-CM | POA: Diagnosis not present

## 2019-07-03 DIAGNOSIS — R4182 Altered mental status, unspecified: Secondary | ICD-10-CM | POA: Diagnosis not present

## 2019-07-03 DIAGNOSIS — M21371 Foot drop, right foot: Secondary | ICD-10-CM | POA: Diagnosis not present

## 2019-07-03 DIAGNOSIS — R64 Cachexia: Secondary | ICD-10-CM | POA: Diagnosis not present

## 2019-07-03 DIAGNOSIS — G40909 Epilepsy, unspecified, not intractable, without status epilepticus: Secondary | ICD-10-CM | POA: Diagnosis not present

## 2019-07-03 DIAGNOSIS — R05 Cough: Secondary | ICD-10-CM | POA: Diagnosis not present

## 2019-07-03 DIAGNOSIS — J302 Other seasonal allergic rhinitis: Secondary | ICD-10-CM | POA: Diagnosis not present

## 2019-07-03 DIAGNOSIS — E039 Hypothyroidism, unspecified: Secondary | ICD-10-CM | POA: Diagnosis not present

## 2019-07-03 DIAGNOSIS — I69354 Hemiplegia and hemiparesis following cerebral infarction affecting left non-dominant side: Secondary | ICD-10-CM | POA: Diagnosis not present

## 2019-07-03 DIAGNOSIS — Z7401 Bed confinement status: Secondary | ICD-10-CM | POA: Diagnosis not present

## 2019-07-03 DIAGNOSIS — I10 Essential (primary) hypertension: Secondary | ICD-10-CM | POA: Diagnosis not present

## 2019-07-05 DIAGNOSIS — H53462 Homonymous bilateral field defects, left side: Secondary | ICD-10-CM | POA: Diagnosis not present

## 2019-07-05 DIAGNOSIS — I10 Essential (primary) hypertension: Secondary | ICD-10-CM | POA: Diagnosis not present

## 2019-07-05 DIAGNOSIS — D45 Polycythemia vera: Secondary | ICD-10-CM | POA: Diagnosis not present

## 2019-07-05 DIAGNOSIS — G40909 Epilepsy, unspecified, not intractable, without status epilepticus: Secondary | ICD-10-CM | POA: Diagnosis not present

## 2019-07-05 DIAGNOSIS — I69354 Hemiplegia and hemiparesis following cerebral infarction affecting left non-dominant side: Secondary | ICD-10-CM | POA: Diagnosis not present

## 2019-07-05 DIAGNOSIS — R4182 Altered mental status, unspecified: Secondary | ICD-10-CM | POA: Diagnosis not present

## 2019-07-09 DIAGNOSIS — I10 Essential (primary) hypertension: Secondary | ICD-10-CM | POA: Diagnosis not present

## 2019-07-09 DIAGNOSIS — R4182 Altered mental status, unspecified: Secondary | ICD-10-CM | POA: Diagnosis not present

## 2019-07-09 DIAGNOSIS — D45 Polycythemia vera: Secondary | ICD-10-CM | POA: Diagnosis not present

## 2019-07-09 DIAGNOSIS — H53462 Homonymous bilateral field defects, left side: Secondary | ICD-10-CM | POA: Diagnosis not present

## 2019-07-09 DIAGNOSIS — I69354 Hemiplegia and hemiparesis following cerebral infarction affecting left non-dominant side: Secondary | ICD-10-CM | POA: Diagnosis not present

## 2019-07-09 DIAGNOSIS — G40909 Epilepsy, unspecified, not intractable, without status epilepticus: Secondary | ICD-10-CM | POA: Diagnosis not present

## 2019-07-10 DIAGNOSIS — I69354 Hemiplegia and hemiparesis following cerebral infarction affecting left non-dominant side: Secondary | ICD-10-CM | POA: Diagnosis not present

## 2019-07-10 DIAGNOSIS — R4182 Altered mental status, unspecified: Secondary | ICD-10-CM | POA: Diagnosis not present

## 2019-07-10 DIAGNOSIS — D45 Polycythemia vera: Secondary | ICD-10-CM | POA: Diagnosis not present

## 2019-07-10 DIAGNOSIS — H53462 Homonymous bilateral field defects, left side: Secondary | ICD-10-CM | POA: Diagnosis not present

## 2019-07-10 DIAGNOSIS — G40909 Epilepsy, unspecified, not intractable, without status epilepticus: Secondary | ICD-10-CM | POA: Diagnosis not present

## 2019-07-10 DIAGNOSIS — I10 Essential (primary) hypertension: Secondary | ICD-10-CM | POA: Diagnosis not present

## 2019-08-02 DEATH — deceased
